# Patient Record
Sex: Female | Born: 1940
Health system: Southern US, Community
[De-identification: ages and names within clinical notes are randomized; demographics above are authoritative.]

## PROBLEM LIST (undated history)

## (undated) DIAGNOSIS — I639 Cerebral infarction, unspecified: Secondary | ICD-10-CM

## (undated) DIAGNOSIS — I10 Essential (primary) hypertension: Secondary | ICD-10-CM

## (undated) DIAGNOSIS — E119 Type 2 diabetes mellitus without complications: Secondary | ICD-10-CM

## (undated) DIAGNOSIS — H269 Unspecified cataract: Secondary | ICD-10-CM

## (undated) HISTORY — DX: Unspecified cataract: H26.9

## (undated) HISTORY — DX: Type 2 diabetes mellitus without complications: E11.9

---

## 1999-07-19 ENCOUNTER — Other Ambulatory Visit: Admission: RE | Admit: 1999-07-19 | Discharge: 1999-07-19 | Payer: Self-pay | Admitting: Internal Medicine

## 2000-07-28 ENCOUNTER — Other Ambulatory Visit: Admission: RE | Admit: 2000-07-28 | Discharge: 2000-07-28 | Payer: Self-pay | Admitting: Internal Medicine

## 2001-09-14 ENCOUNTER — Other Ambulatory Visit: Admission: RE | Admit: 2001-09-14 | Discharge: 2001-09-14 | Payer: Self-pay | Admitting: Internal Medicine

## 2001-11-09 ENCOUNTER — Ambulatory Visit (HOSPITAL_COMMUNITY): Admission: RE | Admit: 2001-11-09 | Discharge: 2001-11-09 | Payer: Self-pay | Admitting: *Deleted

## 2003-01-30 ENCOUNTER — Other Ambulatory Visit: Admission: RE | Admit: 2003-01-30 | Discharge: 2003-01-30 | Payer: Self-pay | Admitting: Obstetrics and Gynecology

## 2004-07-30 ENCOUNTER — Other Ambulatory Visit: Admission: RE | Admit: 2004-07-30 | Discharge: 2004-07-30 | Payer: Self-pay | Admitting: Internal Medicine

## 2006-01-20 ENCOUNTER — Other Ambulatory Visit: Admission: RE | Admit: 2006-01-20 | Discharge: 2006-01-20 | Payer: Self-pay | Admitting: Internal Medicine

## 2006-01-23 ENCOUNTER — Ambulatory Visit (HOSPITAL_COMMUNITY): Admission: RE | Admit: 2006-01-23 | Discharge: 2006-01-23 | Payer: Self-pay | Admitting: Internal Medicine

## 2006-01-23 IMAGING — US US TRANSVAGINAL NON-OB
1 series · 18 of 25 positions shown · non-contrast
Comparison: none

CLINICAL DATA: Postmenopausal bleeding.  Previous history of uterine polyps.  
 TRANSABDOMINAL AND TRANSVAGINAL PELVIC ULTRASOUND:
TECHNIQUE: Both transabdominal and transvaginal ultrasound examinations of the pelvis were performed including evaluation of the uterus, ovaries, adnexal regions, and pelvic cul-de-sac.

[Series 1: us pelvis complete modify · 18 of 68 slices shown]
[im 1/68]
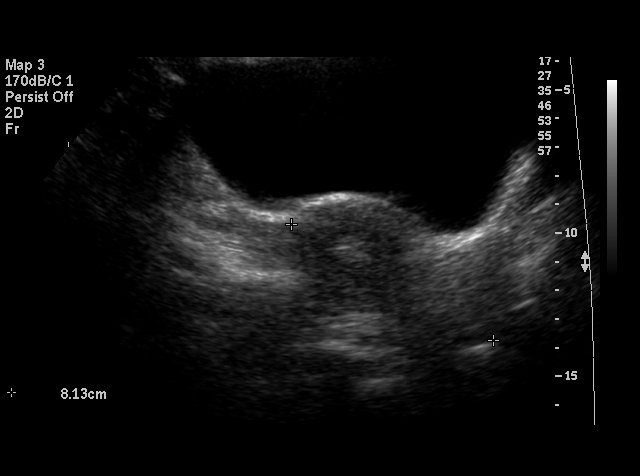
[im 6/68]
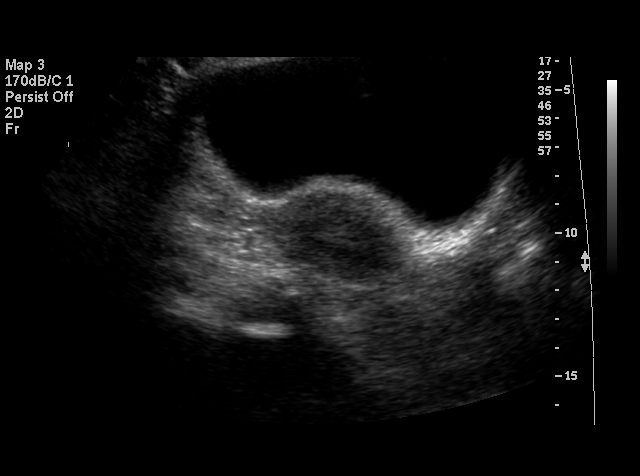
[im 9/68]
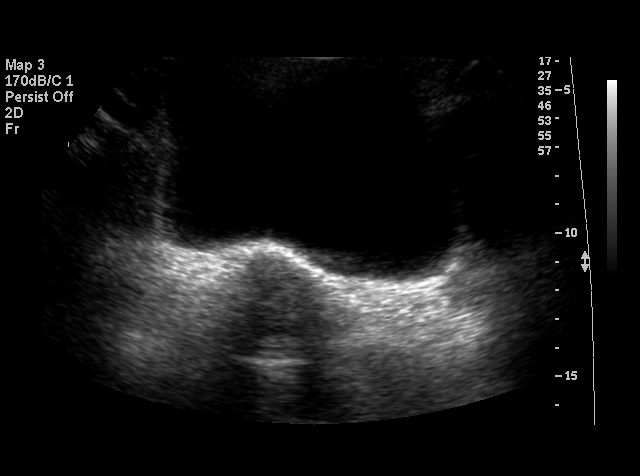
[im 12/68]
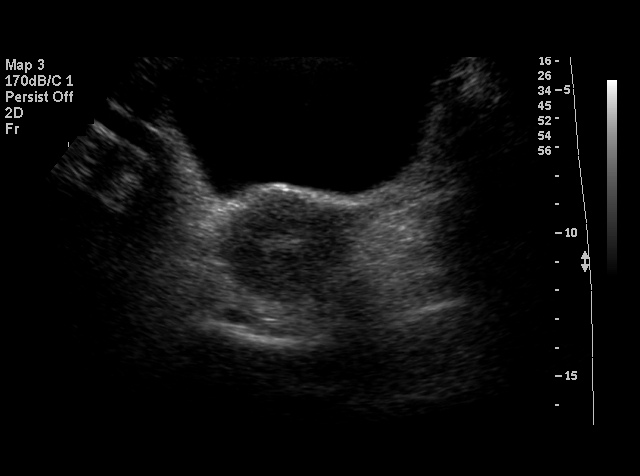
[im 17/68]
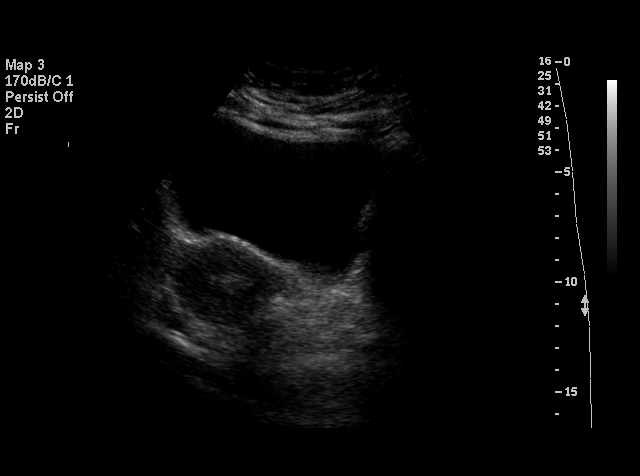
[im 20/68]
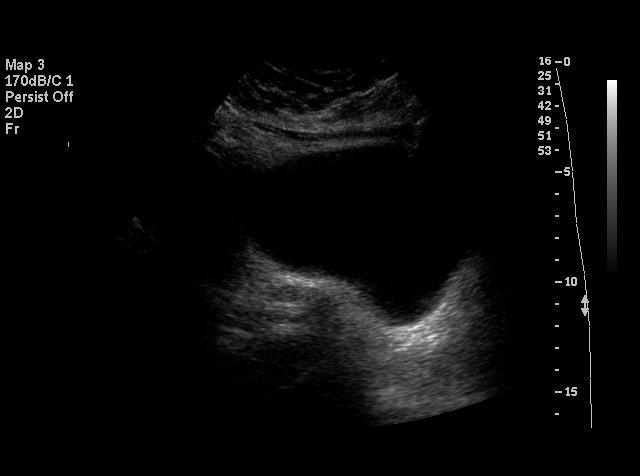
[im 26/68]
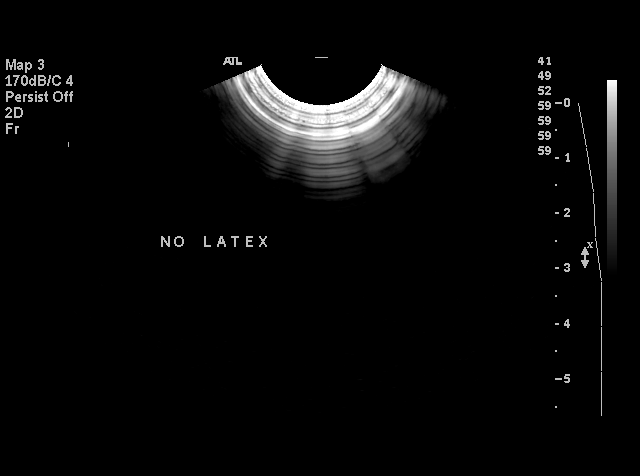
[im 28/68]
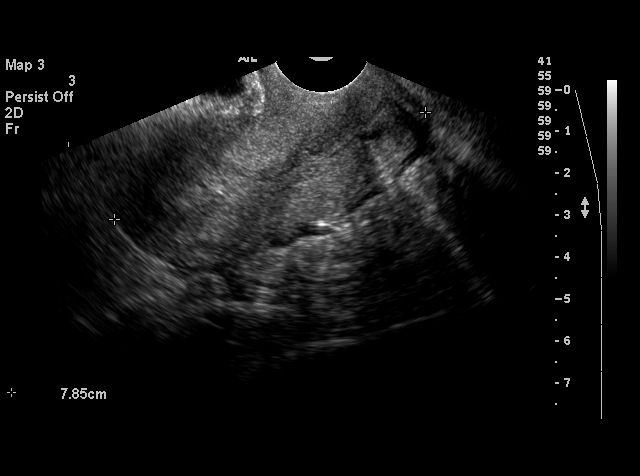
[im 31/68]
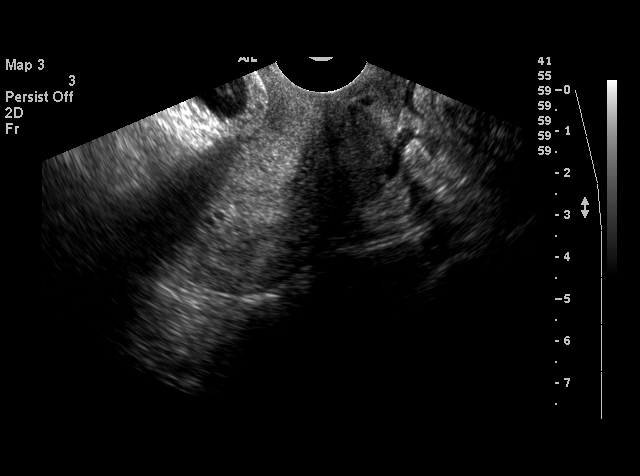
[im 37/68]
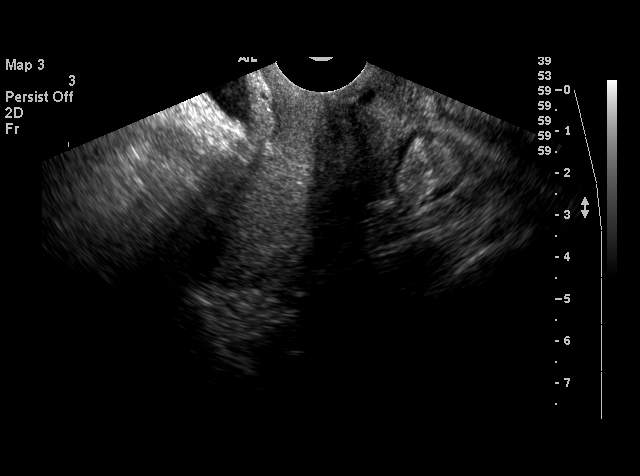
[im 40/68]
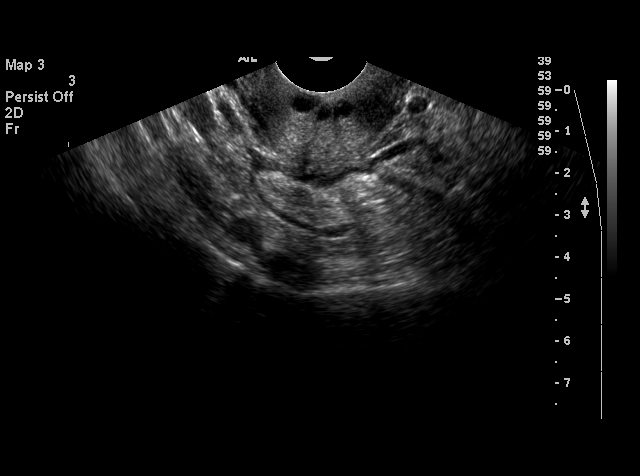
[im 42/68]
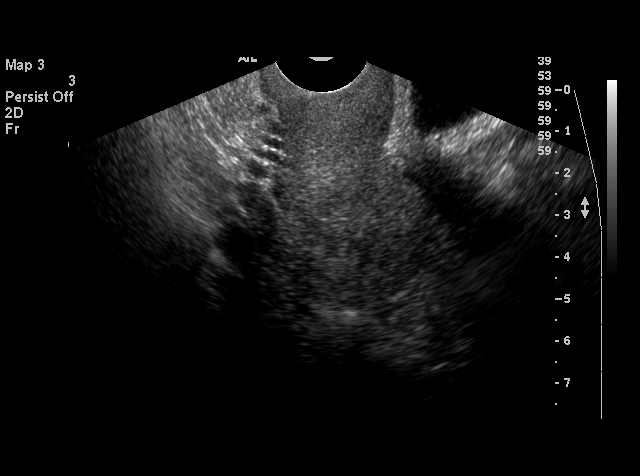
[im 48/68]
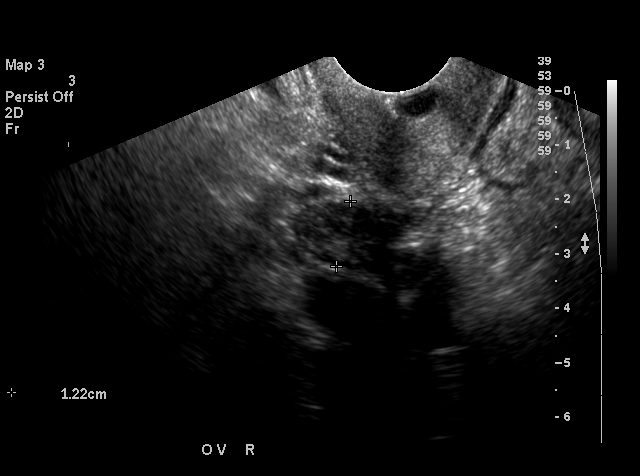
[im 51/68]
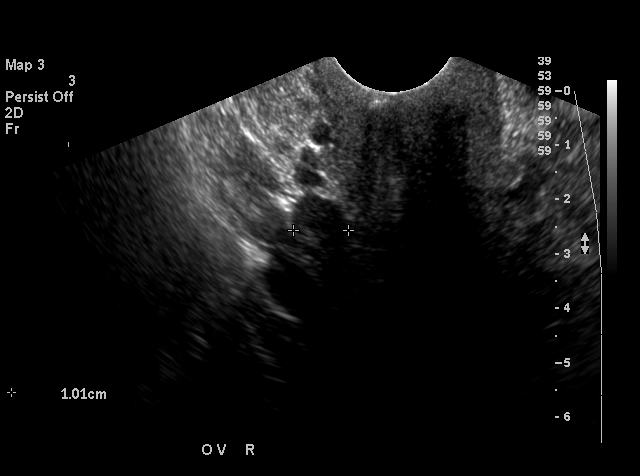
[im 56/68]
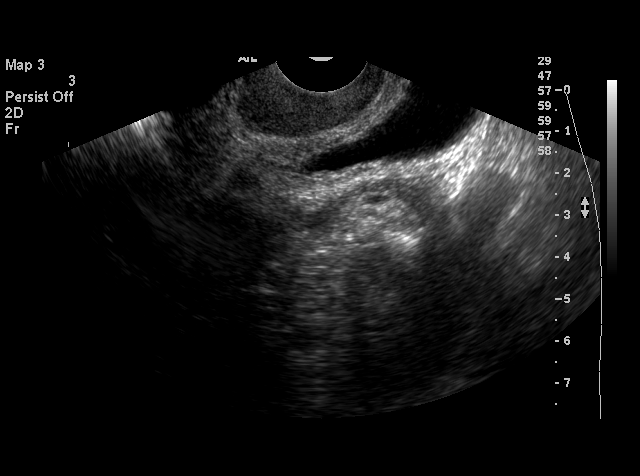
[im 59/68]
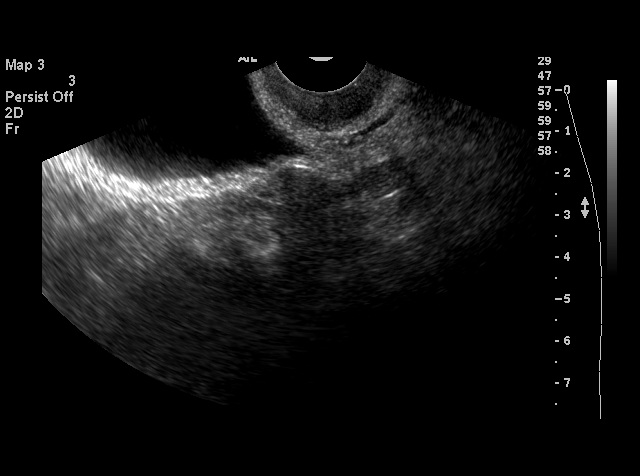
[im 62/68]
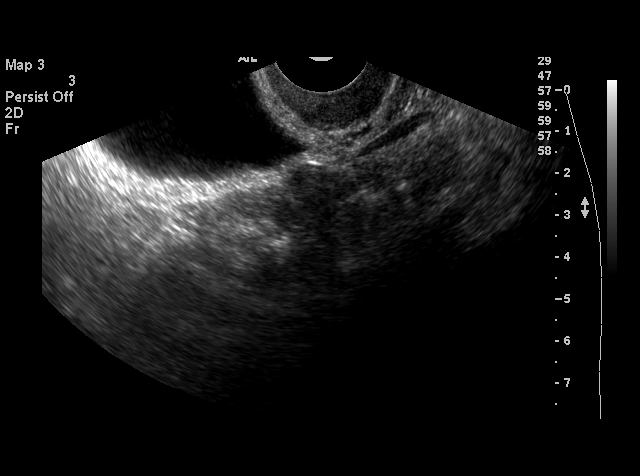
[im 68/68]
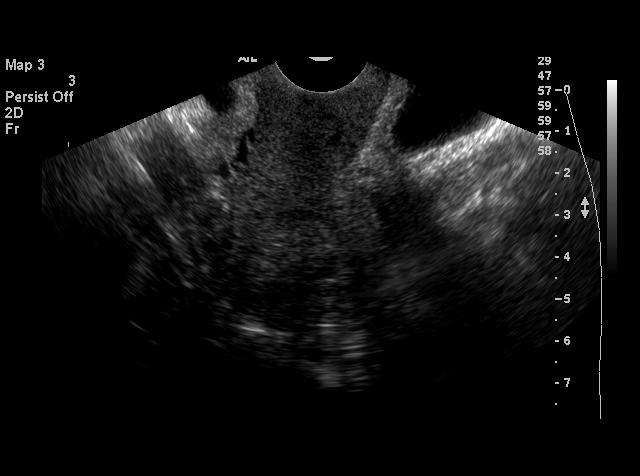

[18 of 25 positions shown; findings below may reference images not displayed]

FINDINGS: The uterus is normal in size measuring approximately 8 cm in length x 4 cm in AP diameter x 4 cm in transverse diameter.  No fibroids are identified.  The endometrium has a heterogeneous appearance with several internal cystic foci.  Maximum double layer endometrial thickness on transvaginal sonography measures 1.2 cm, which is abnormal for a postmenopausal patient.  Differential considerations include endometrial polyps, hyperplasia, and carcinoma. 
 No adnexal masses are identified by either transabdominal or transvaginal sonography.  Normal postmenopausal size and appearance of both ovaries is seen on transvaginal sonography.  There is no evidence of free fluid.
IMPRESSION: 1.  Thickened endometrium measuring 12 mm with internal cystic foci noted.  Differential considerations include endometrial polyps, hyperplasia, or carcinoma.  Further evaluation is recommended with sonohysterogram followed by appropriate method of endometrial sampling.  
 2.  Normal ovaries.  No evidence of adnexal mass or free fluid.

## 2006-04-14 ENCOUNTER — Ambulatory Visit (HOSPITAL_COMMUNITY): Admission: RE | Admit: 2006-04-14 | Discharge: 2006-04-14 | Payer: Self-pay | Admitting: Obstetrics and Gynecology

## 2006-04-24 ENCOUNTER — Emergency Department (HOSPITAL_COMMUNITY): Admission: EM | Admit: 2006-04-24 | Discharge: 2006-04-24 | Payer: Self-pay | Admitting: Family Medicine

## 2006-04-24 IMAGING — CT CT HEAD W/O CM
1 series · 16 of 30 positions shown, 20 images · IV contrast (agent unspecified)
Comparison: none

CLINICAL DATA: Weakness, fever.  Headache.
 HEAD CT WITHOUT CONTRAST:
TECHNIQUE: Contiguous axial images were obtained from the base of the skull through the vertex, according to standard protocol, without contrast.

[Series 2: routinehead 4.8 h45s · axial · 0.45mm/px · z∈[-146,-11]mm · 16 of 30 slices shown, 20 images]
[im 2/30  brain]
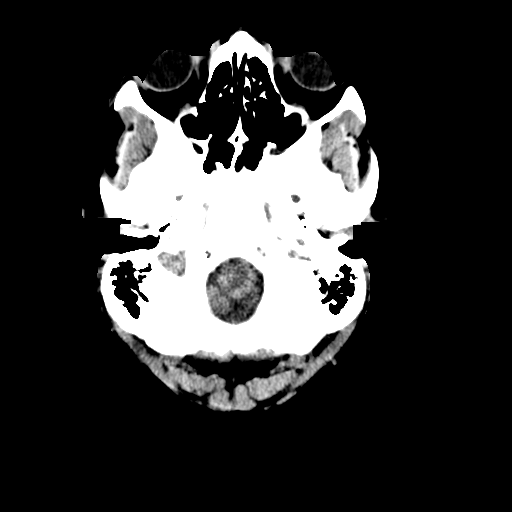
[im 2/30  bone]
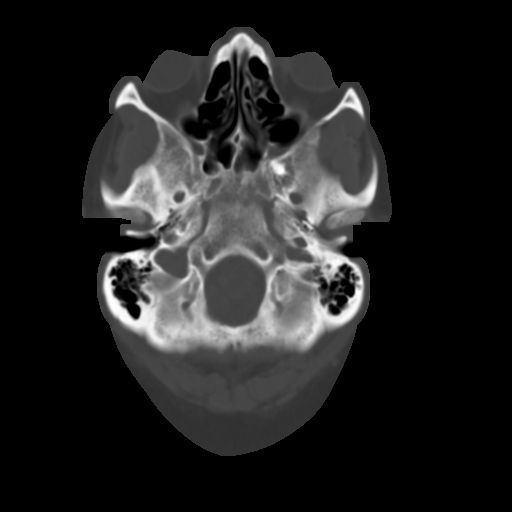
[im 4/30  brain]
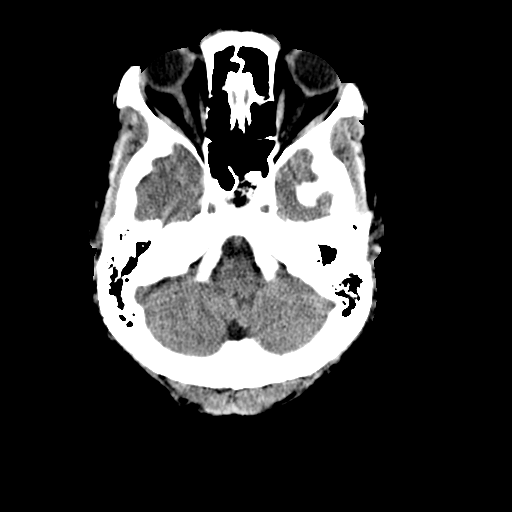
[im 6/30  brain]
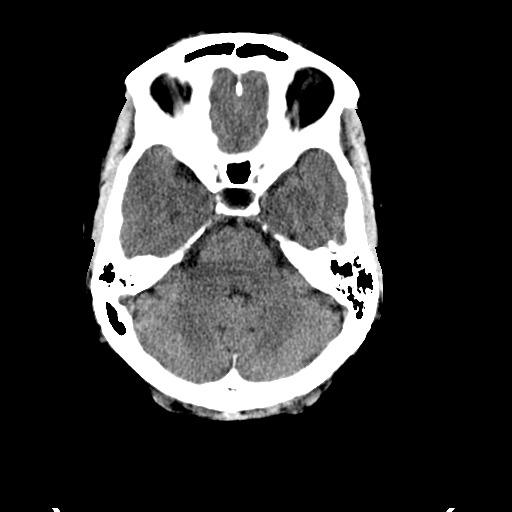
[im 8/30  brain]
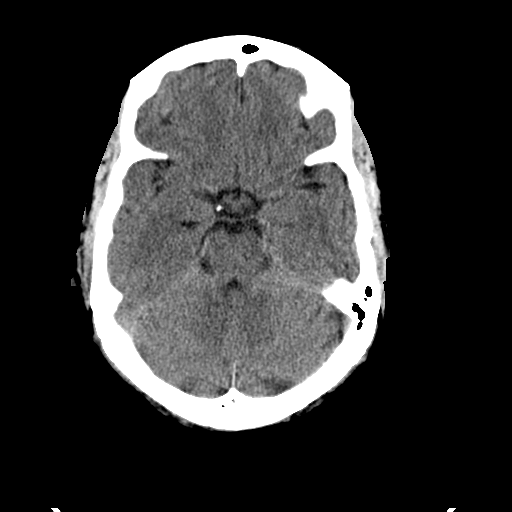
[im 9/30  brain]
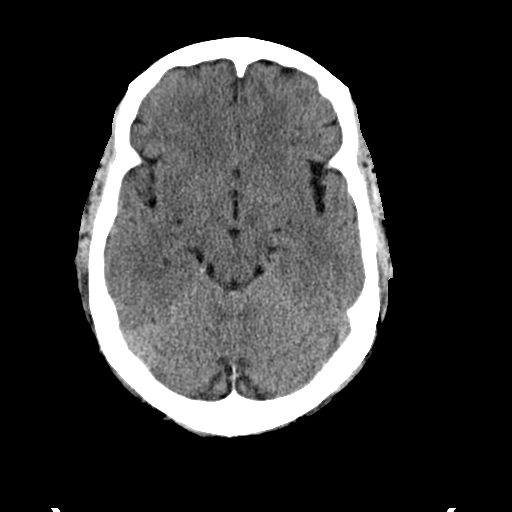
[im 9/30  bone]
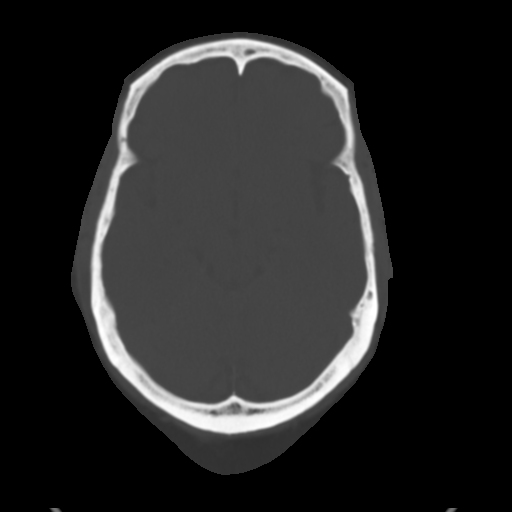
[im 11/30  brain]
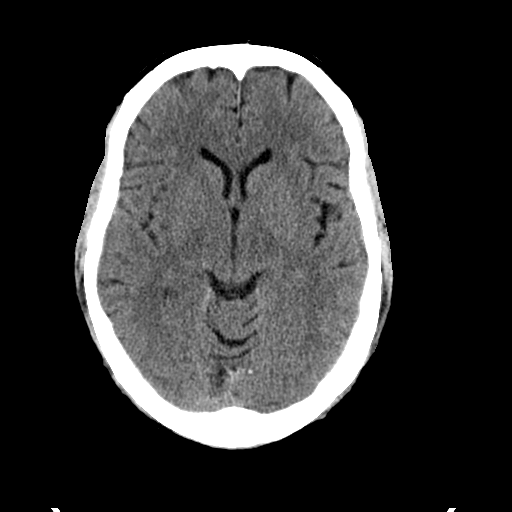
[im 13/30  brain]
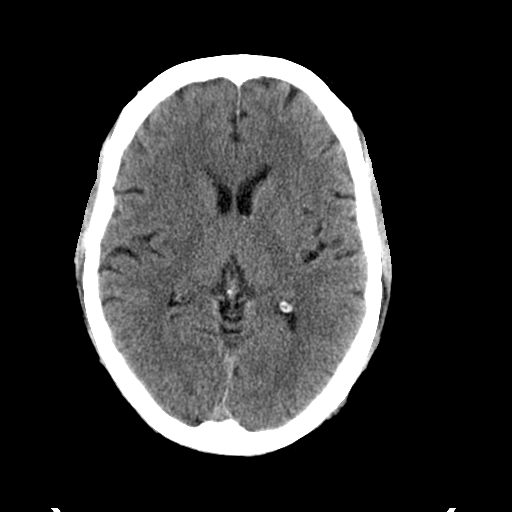
[im 15/30  brain]
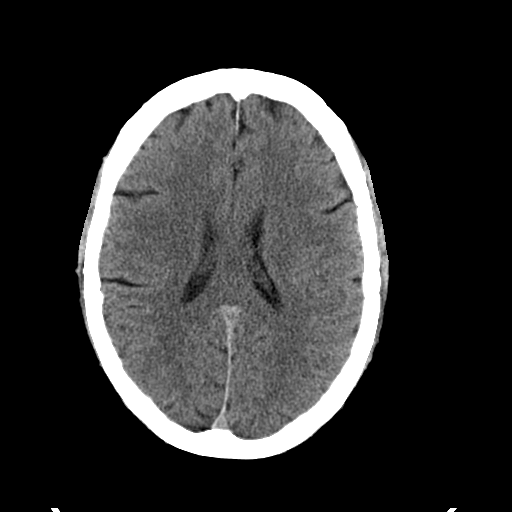
[im 16/30  brain]
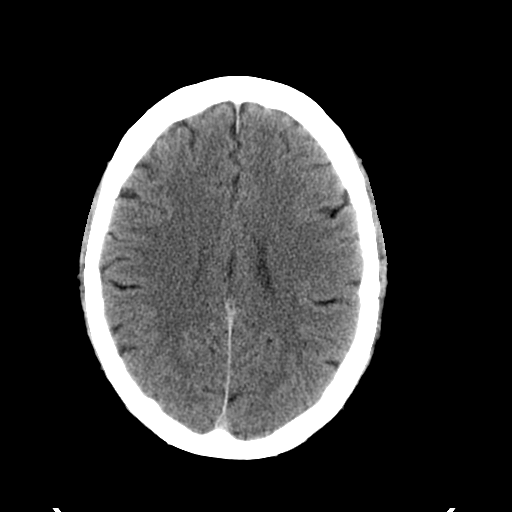
[im 16/30  bone]
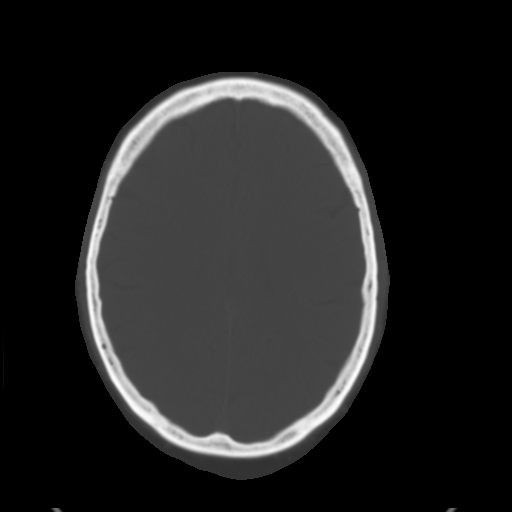
[im 18/30  brain]
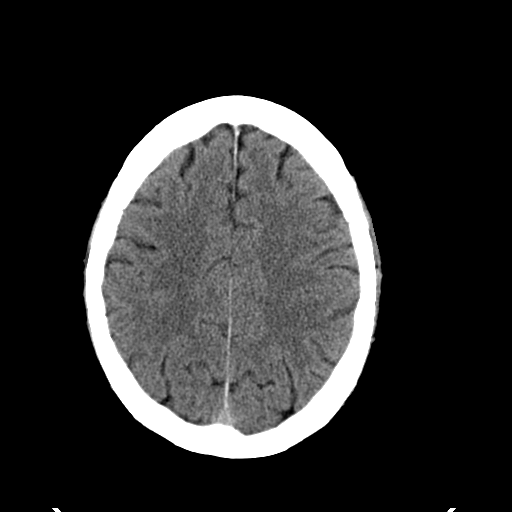
[im 20/30  brain]
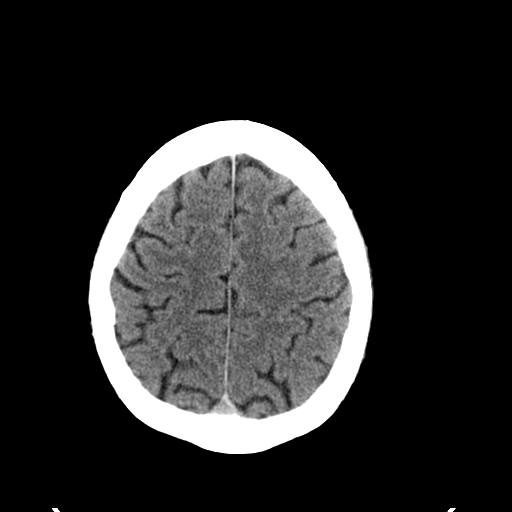
[im 22/30  brain]
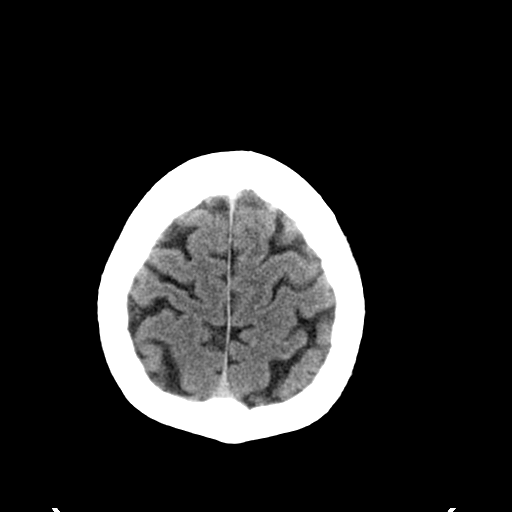
[im 23/30  brain]
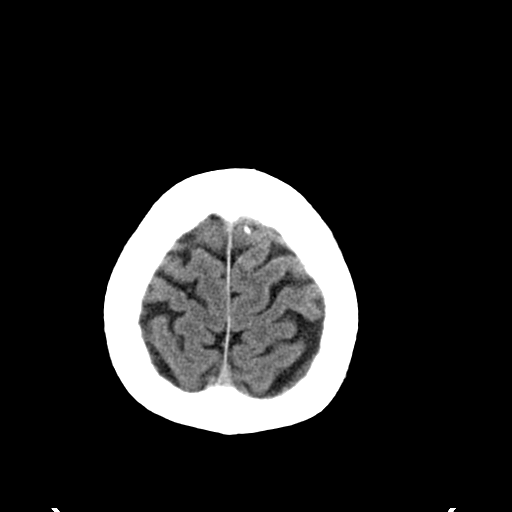
[im 23/30  bone]
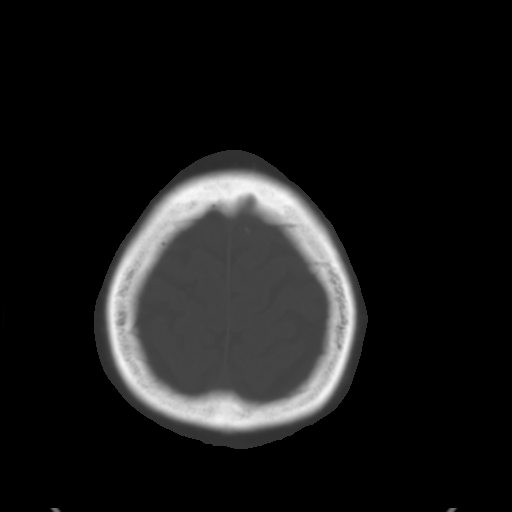
[im 25/30  brain]
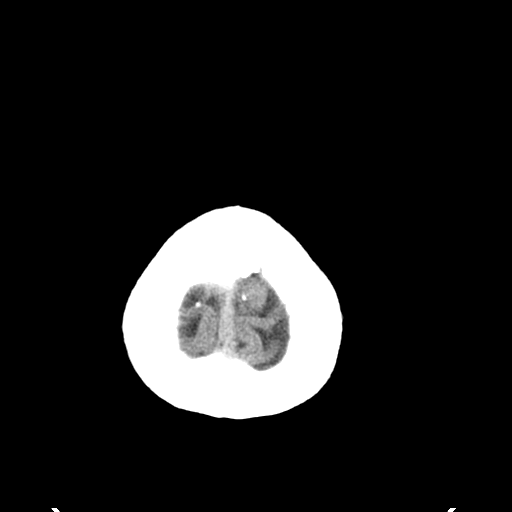
[im 27/30  brain]
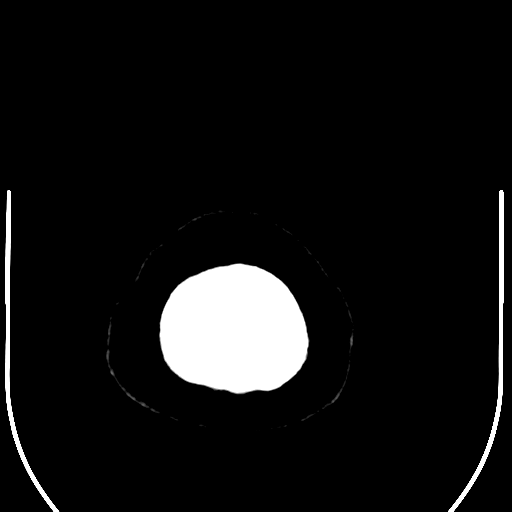
[im 29/30  brain]
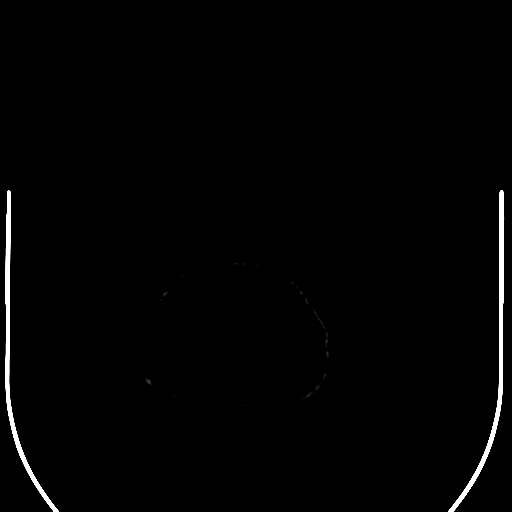

[16 of 30 positions shown; findings below may reference images not displayed]

FINDINGS: There is no evidence of acute intracranial abnormality, including mass or mass effect, hydrocephalus, extra-axial fluid collection, midline shift, hemorrhage, or acute infarct.  Acute infarct may be occult on CT.  The visualized bony calvarium and paranasal sinuses are unremarkable.
IMPRESSION: No evidence of acute intracranial abnormality.

## 2006-10-27 ENCOUNTER — Ambulatory Visit (HOSPITAL_COMMUNITY): Admission: RE | Admit: 2006-10-27 | Discharge: 2006-10-27 | Payer: Self-pay | Admitting: *Deleted

## 2012-01-28 ENCOUNTER — Other Ambulatory Visit: Payer: Self-pay | Admitting: Internal Medicine

## 2012-02-25 ENCOUNTER — Ambulatory Visit
Admission: RE | Admit: 2012-02-25 | Discharge: 2012-02-25 | Disposition: A | Discharge status: Home / Self Care | Payer: Medicare Other | Source: Ambulatory Visit | Attending: Internal Medicine | Admitting: Internal Medicine

## 2012-02-25 IMAGING — CT CT ABD-PELV W/ CM
3 of 5 series · 13 of 36 positions shown, 19 images · IV contrast (READICAT/WATER & [ID] OMNI 300)
Comparison: None.

CLINICAL DATA: Right flank pain.

CT ABDOMEN AND PELVIS WITH CONTRAST
TECHNIQUE: Multidetector CT imaging of the abdomen and pelvis was
performed following the standard protocol during bolus
administration of intravenous contrast.
Contrast: 100mL OMNIPAQUE IOHEXOL 300 MG/ML  SOLN

[Series 3: abd/pelvis with · axial · 0.79mm/px · z∈[-274,-4]mm · 6 of 76 slices shown, 11 images]
[im 11/76  soft-tissue]
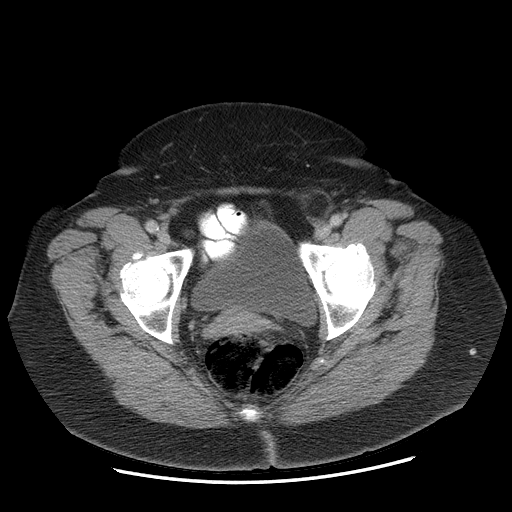
[im 11/76  bone]
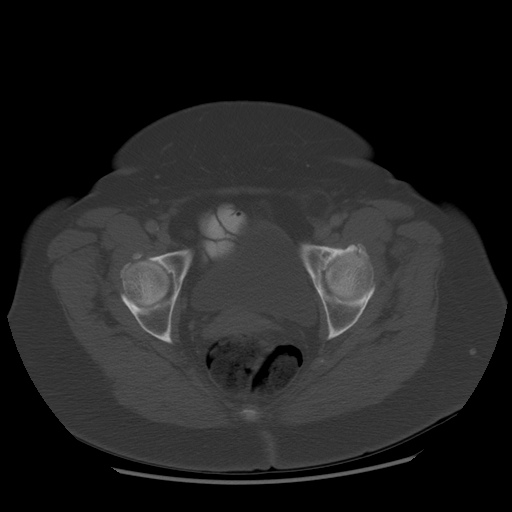
[im 22/76  soft-tissue]
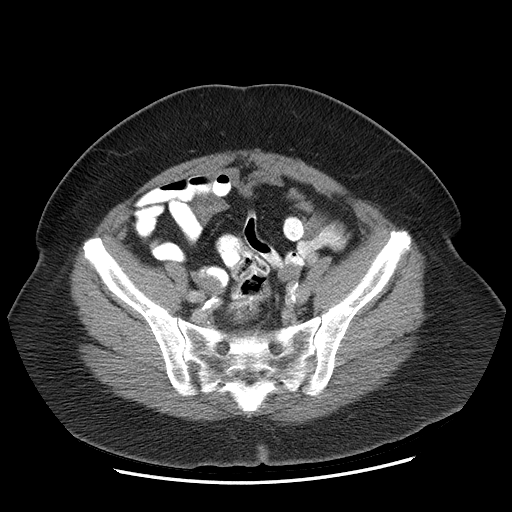
[im 33/76  soft-tissue]
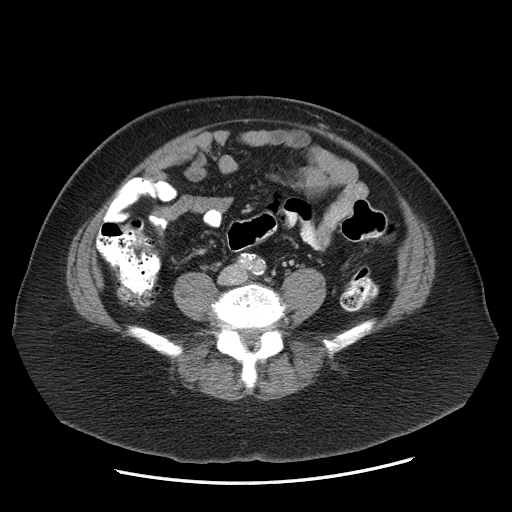
[im 33/76  lung]
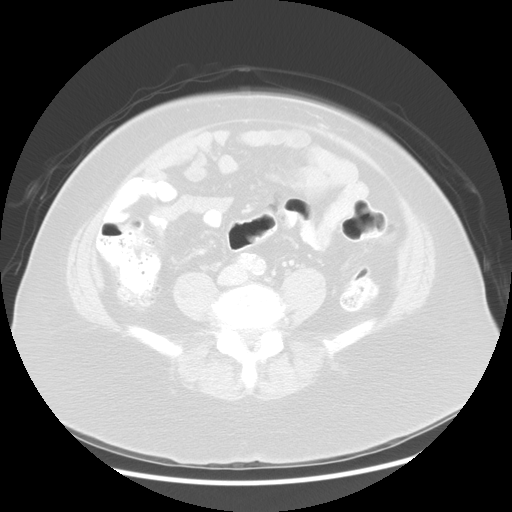
[im 43/76  soft-tissue]
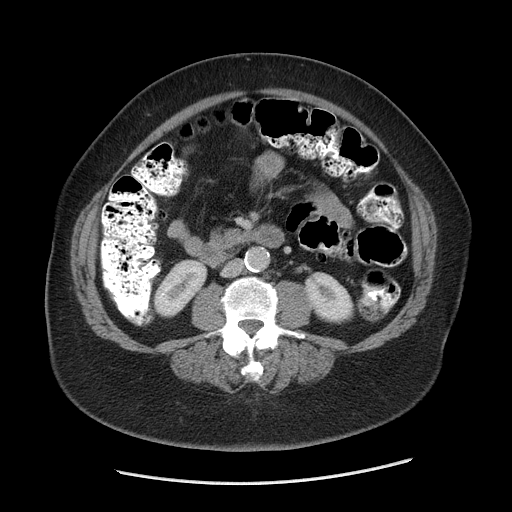
[im 43/76  lung]
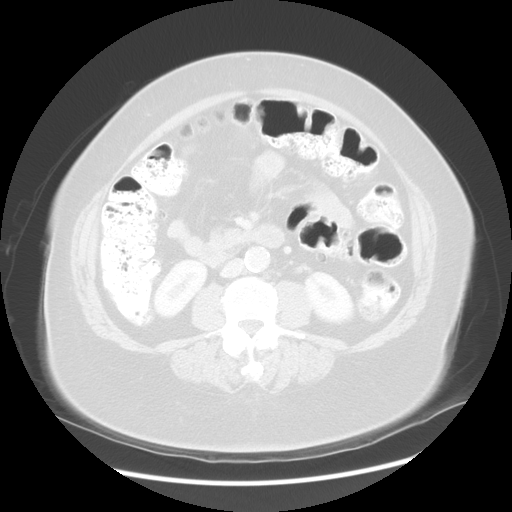
[im 54/76  soft-tissue]
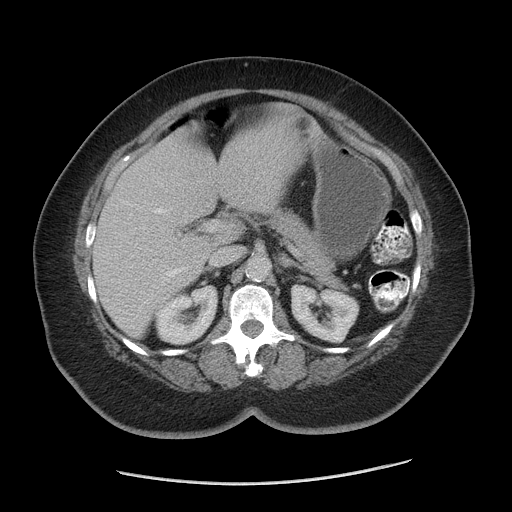
[im 54/76  lung]
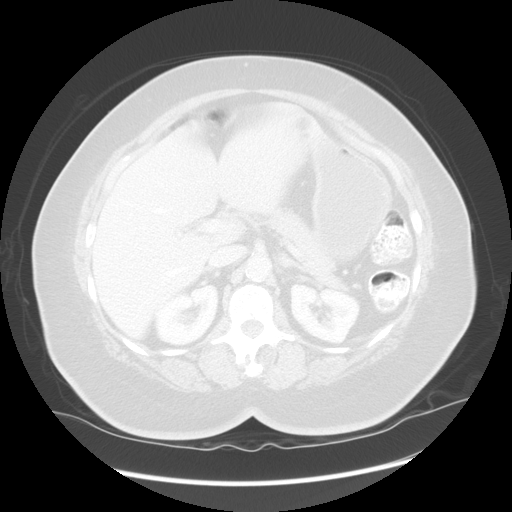
[im 65/76  soft-tissue]
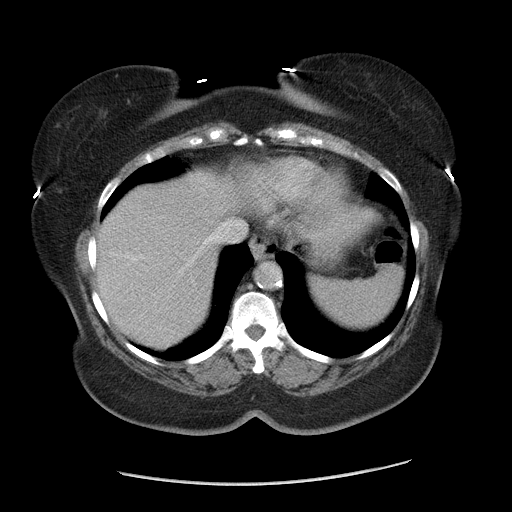
[im 65/76  lung]
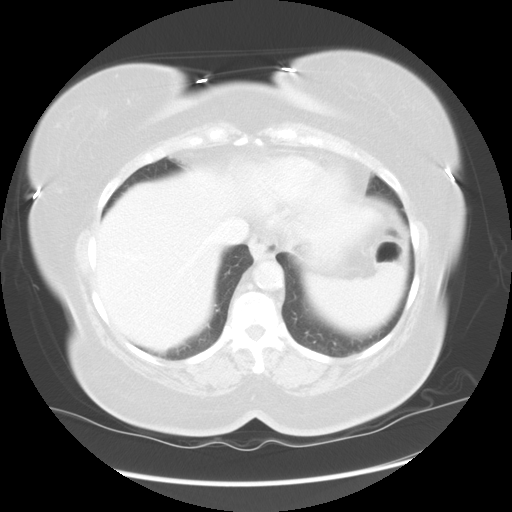

[Series 601: coronal body · coronal · 0.81mm/px · 1 of 139 slices shown, 2 images]
[im 47/139  soft-tissue]
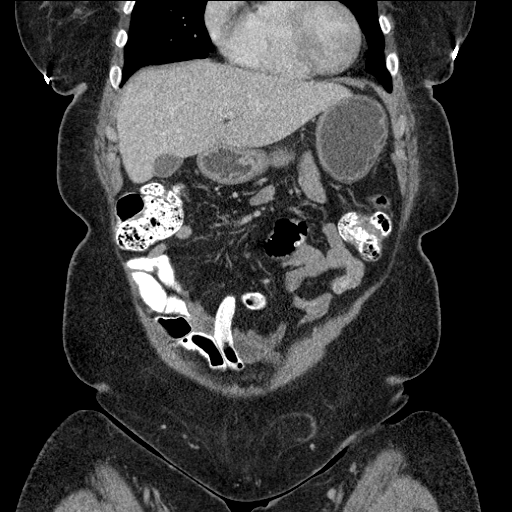
[im 47/139  bone]
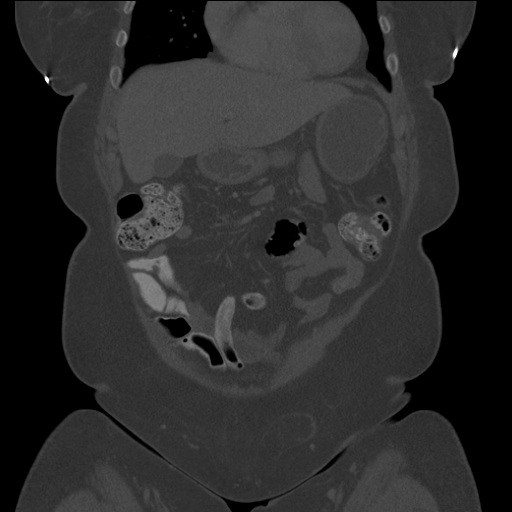

[Series 602: sagittal body · sagittal · 0.81mm/px · 6 of 164 slices shown]
[im 20/164  soft-tissue]
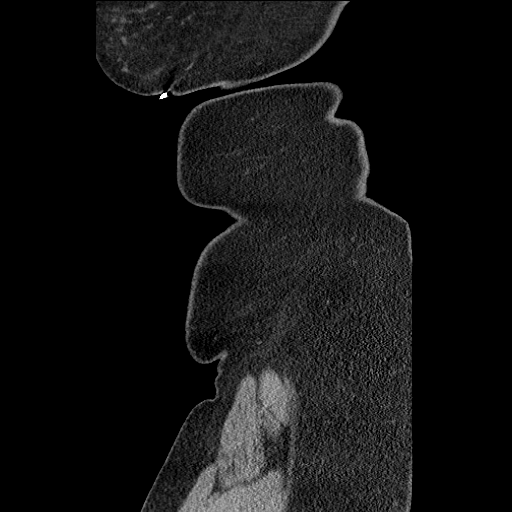
[im 39/164  soft-tissue]
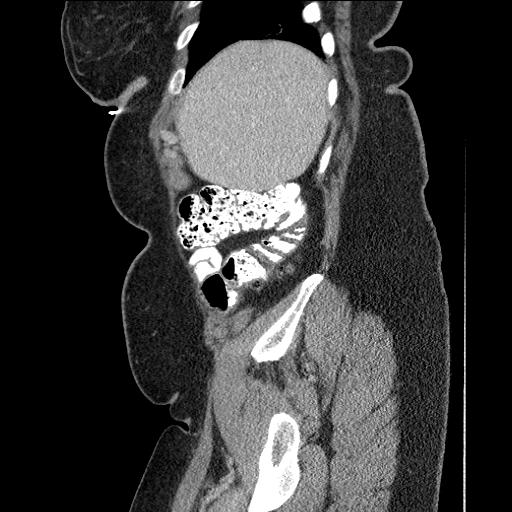
[im 58/164  soft-tissue]
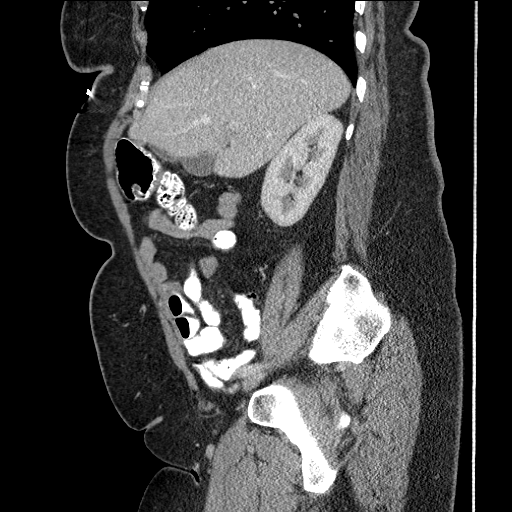
[im 77/164  soft-tissue]
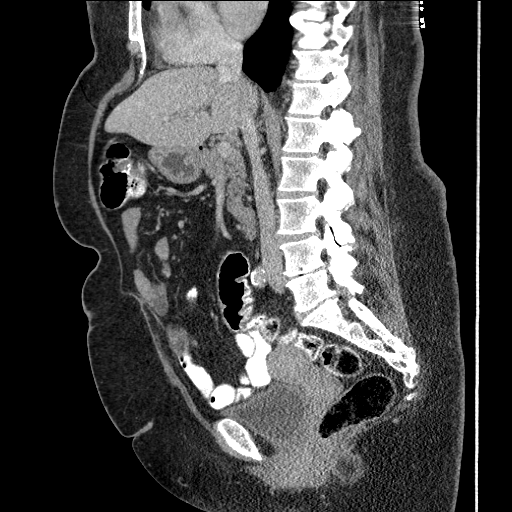
[im 96/164  soft-tissue]
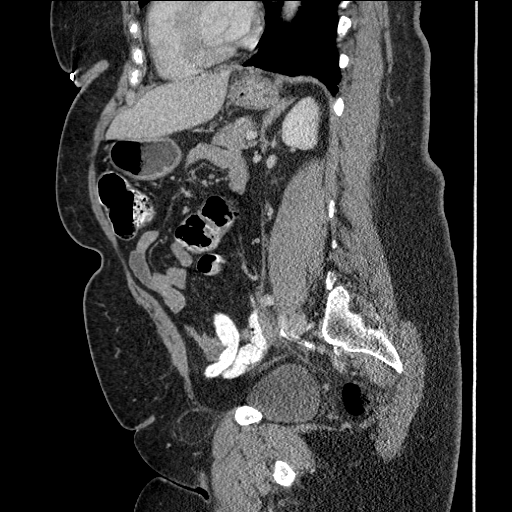
[im 116/164  soft-tissue]
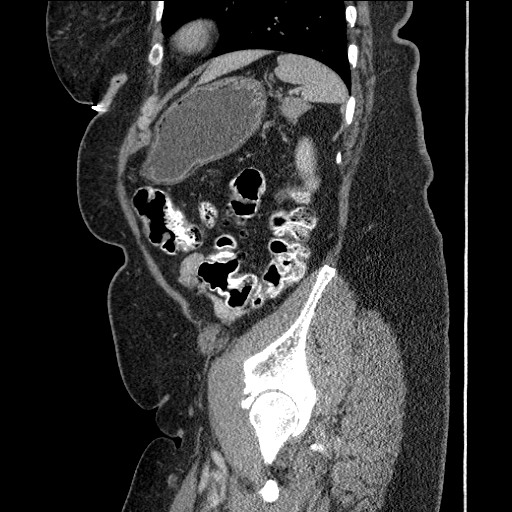

[13 of 36 positions shown; findings below may reference images not displayed]

FINDINGS: Small low-density area centrally within the liver, likely
small cyst.  No other focal abnormality.  Gallbladder, spleen,
pancreas, adrenals and kidneys are unremarkable.

Appendix is visualized and is normal. There are scattered right
colonic diverticula.  No changes of active diverticulitis.
Otherwise, large and small bowel are grossly unremarkable.  No free
fluid, free air, or adenopathy.  Uterus, adnexa and urinary bladder
is unremarkable.

Small left inguinal hernia containing fat.  Aorta and iliac vessels
are heavily calcified, non-aneurysmal.
IMPRESSION: Right colonic diverticulosis.  No active diverticulitis.  Moderate
stool burden throughout the colon.

## 2012-02-25 MED ORDER — IOHEXOL 300 MG/ML  SOLN
100.0000 mL | Freq: Once | INTRAMUSCULAR | Status: AC | PRN
  Administered 2012-02-25: 100 mL via INTRAVENOUS

## 2013-01-17 ENCOUNTER — Ambulatory Visit (INDEPENDENT_AMBULATORY_CARE_PROVIDER_SITE_OTHER): Payer: Medicare Other | Admitting: Family Medicine

## 2013-01-17 VITALS — BP 166/80 | HR 80 | Temp 98.1°F | Resp 16 | Ht 63.0 in | Wt 190.0 lb

## 2013-01-17 DIAGNOSIS — L03012 Cellulitis of left finger: Secondary | ICD-10-CM

## 2013-01-17 DIAGNOSIS — L03019 Cellulitis of unspecified finger: Secondary | ICD-10-CM

## 2013-01-17 DIAGNOSIS — M79645 Pain in left finger(s): Secondary | ICD-10-CM

## 2013-01-17 DIAGNOSIS — M79609 Pain in unspecified limb: Secondary | ICD-10-CM

## 2013-01-17 MED ORDER — SULFAMETHOXAZOLE-TRIMETHOPRIM 800-160 MG PO TABS: 1.0000 | ORAL_TABLET | Freq: Two times a day (BID) | ORAL | Status: DC

## 2013-01-17 NOTE — Progress Notes (Signed)
Subjective: 72 year old lady who is here with a painful left thumb. Last Thursday he just started spontaneously hurting in his gotten worse until the thumb is throbbing her. Otherwise she is generally healthy. She does have hypertension and is on medication. She's been told that she was a new diabetic and has been on metformin but her sugars are being., 109 this past Saturday.  Objective: Paronychia left thumb, quite tender, moderate swelling  Assessment: Acute paronychia  Plan: I&D thumb. Bactrim DS twice a day Return if further problems

## 2013-01-17 NOTE — Progress Notes (Signed)
Procedure Note: Verbal consent obtained from the patient.  Partial digital block of the thumb with 2cc 1% plain lidocaine.  Betadine prep.  Incision with 11 blade at the medial nail margin.  Moderate amount of purulence expressed.  Culture collected.  Hemostasis obtained with direct pressure.  Bandage applied.  Wound care discussed.  Pt tolerated well.

## 2013-01-17 NOTE — Patient Instructions (Addendum)
Keep wound clean and dry.  Take the antibiotics one twice daily. If the antibiotic causes you any problems like a rash or other allergy symptoms stop it immediately and let us know.  Return if further problems   Paronychia Paronychia is an inflammatory reaction involving the folds of the skin surrounding the fingernail. This is commonly caused by an infection in the skin around a nail. The most common cause of paronychia is frequent wetting of the hands (as seen with bartenders, food servers, nurses or others who wet their hands). This makes the skin around the fingernail susceptible to infection by bacteria (germs) or fungus. Other predisposing factors are:  Aggressive manicuring.  Nail biting.  Thumb sucking. The most common cause is a staphylococcal (a type of germ) infection, or a fungal (Candida) infection. When caused by a germ, it usually comes on suddenly with redness, swelling, pus and is often painful. It may get under the nail and form an abscess (collection of pus), or form an abscess around the nail. If the nail itself is infected with a fungus, the treatment is usually prolonged and may require oral medicine for up to one year. Your caregiver will determine the length of time treatment is required. The paronychia caused by bacteria (germs) may largely be avoided by not pulling on hangnails or picking at cuticles. When the infection occurs at the tips of the finger it is called felon. When the cause of paronychia is from the herpes simplex virus (HSV) it is called herpetic whitlow. TREATMENT  When an abscess is present treatment is often incision and drainage. This means that the abscess must be cut open so the pus can get out. When this is done, the following home care instructions should be followed. HOME CARE INSTRUCTIONS   It is important to keep the affected fingers very dry. Rubber or plastic gloves over cotton gloves should be used whenever the hand must be placed in  water.  Keep wound clean, dry and dressed as suggested by your caregiver between warm soaks or warm compresses.  Soak in warm water for fifteen to twenty minutes three to four times per day for bacterial infections. Fungal infections are very difficult to treat, so often require treatment for long periods of time.  For bacterial (germ) infections take antibiotics (medicine which kill germs) as directed and finish the prescription, even if the problem appears to be solved before the medicine is gone.  Only take over-the-counter or prescription medicines for pain, discomfort, or fever as directed by your caregiver. SEEK IMMEDIATE MEDICAL CARE IF:  You have redness, swelling, or increasing pain in the wound.  You notice pus coming from the wound.  You have a fever.  You notice a bad smell coming from the wound or dressing. Document Released: 03/18/2001 Document Revised: 12/15/2011 Document Reviewed: 11/17/2008 [facility] Patient Information 2013 [place], [place].

## 2013-01-20 LAB — WOUND CULTURE
Gram Stain: NONE SEEN
Gram Stain: NONE SEEN

## 2015-07-29 DIAGNOSIS — Z23 Encounter for immunization: Secondary | ICD-10-CM | POA: Diagnosis not present

## 2015-08-27 DIAGNOSIS — Z0000 Encounter for general adult medical examination without abnormal findings: Secondary | ICD-10-CM | POA: Diagnosis not present

## 2015-08-27 DIAGNOSIS — E7800 Pure hypercholesterolemia, unspecified: Secondary | ICD-10-CM | POA: Diagnosis not present

## 2015-08-27 DIAGNOSIS — R739 Hyperglycemia, unspecified: Secondary | ICD-10-CM | POA: Diagnosis not present

## 2015-08-27 DIAGNOSIS — I10 Essential (primary) hypertension: Secondary | ICD-10-CM | POA: Diagnosis not present

## 2015-09-05 DIAGNOSIS — I10 Essential (primary) hypertension: Secondary | ICD-10-CM | POA: Diagnosis not present

## 2015-09-05 DIAGNOSIS — R739 Hyperglycemia, unspecified: Secondary | ICD-10-CM | POA: Diagnosis not present

## 2015-11-09 DIAGNOSIS — Z803 Family history of malignant neoplasm of breast: Secondary | ICD-10-CM | POA: Diagnosis not present

## 2015-11-09 DIAGNOSIS — Z1231 Encounter for screening mammogram for malignant neoplasm of breast: Secondary | ICD-10-CM | POA: Diagnosis not present

## 2015-11-13 ENCOUNTER — Emergency Department (HOSPITAL_COMMUNITY)
Admission: EM | Admit: 2015-11-13 | Discharge: 2015-11-13 | Disposition: A | Discharge status: Home / Self Care | Payer: Medicare HMO | Source: Home / Self Care | Attending: Family Medicine | Admitting: Family Medicine

## 2015-11-13 ENCOUNTER — Encounter (HOSPITAL_COMMUNITY): Payer: Self-pay | Admitting: Emergency Medicine

## 2015-11-13 DIAGNOSIS — I10 Essential (primary) hypertension: Secondary | ICD-10-CM

## 2015-11-13 DIAGNOSIS — H6693 Otitis media, unspecified, bilateral: Secondary | ICD-10-CM

## 2015-11-13 MED ORDER — FLUTICASONE PROPIONATE 50 MCG/ACT NA SUSP: 2.0000 | Freq: Every day | NASAL | Status: DC

## 2015-11-13 MED ORDER — AMOXICILLIN 500 MG PO CAPS: 500.0000 mg | ORAL_CAPSULE | Freq: Three times a day (TID) | ORAL | Status: DC

## 2015-11-13 NOTE — Discharge Instructions (Signed)
How to Take Your Blood Pressure °HOW DO I GET A BLOOD PRESSURE MACHINE? °· You can buy an electronic home blood pressure machine at your local pharmacy. Insurance will sometimes cover the cost if you have a prescription. °· Ask your doctor what type of machine is best for you. There are different machines for your arm and your wrist. °· If you decide to buy a machine to check your blood pressure on your arm, first check the size of your arm so you can buy the right size cuff. To check the size of your arm:   °· Use a measuring tape that shows both inches and centimeters.   °· Wrap the measuring tape around the upper-middle part of your arm. You may need someone to help you measure.   °· Write down your arm measurement in both inches and centimeters.   °· To measure your blood pressure correctly, it is important to have the right size cuff.   °· If your arm is up to 13 inches (up to 34 centimeters), get an adult cuff size. °· If your arm is 13 to 17 inches (35 to 44 centimeters), get a large adult cuff size.   °·  If your arm is 17 to 20 inches (45 to 52 centimeters), get an adult thigh cuff.   °WHAT DO THE NUMBERS MEAN?  °· There are two numbers that make up your blood pressure. For example: 120/80. °· The first number (120 in our example) is called the "systolic pressure." It is a measure of the pressure in your blood vessels when your heart is pumping blood. °· The second number (80 in our example) is called the "diastolic pressure." It is a measure of the pressure in your blood vessels when your heart is resting between beats. °· Your doctor will tell you what your blood pressure should be. °WHAT SHOULD I DO BEFORE I CHECK MY BLOOD PRESSURE?  °· Try to rest or relax for at least 30 minutes before you check your blood pressure. °· Do not smoke. °· Do not have any drinks with caffeine, such as: °· Soda. °· Coffee. °· Tea. °· Check your blood pressure in a quiet room. °· Sit down and stretch out your arm on a table.  Keep your arm at about the level of your heart. Let your arm relax. °· Make sure that your legs are not crossed. °HOW DO I CHECK MY BLOOD PRESSURE? °· Follow the directions that came with your machine. °· Make sure you remove any tight-fitting clothing from your arm or wrist. Wrap the cuff around your upper arm or wrist. You should be able to fit a finger between the cuff and your arm. If you cannot fit a finger between the cuff and your arm, it is too tight and should be removed and rewrapped. °· Some units require you to manually pump up the arm cuff. °· Automatic units inflate the cuff when you press a button. °· Cuff deflation is automatic in both models. °· After the cuff is inflated, the unit measures your blood pressure and pulse. The readings are shown on a monitor. Hold still and breathe normally while the cuff is inflated. °· Getting a reading takes less than a minute. °· Some models store readings in a memory. Some provide a printout of readings. If your machine does not store your readings, keep a written record. °· Take readings with you to your next visit with your doctor. °  °This information is not intended to replace advice given to   you by your health care provider. Make sure you discuss any questions you have with your health care provider. °  °Document Released: 09/04/2008 Document Revised: 10/13/2014 Document Reviewed: 11/17/2013 °Elsevier Interactive Patient Education ©2016 [employer] Inc[employer] ° °Hypertension °Hypertension, commonly called high blood pressure, is when the force of blood pumping through your arteries is too strong. Your arteries are the blood vessels that carry blood from your heart throughout your body. A blood pressure reading consists of a higher number over a lower number, such as 110/72. The higher number (systolic) is the pressure inside your arteries when your heart pumps. The lower number (diastolic) is the pressure inside your arteries when your heart relaxes. Ideally you want  your blood pressure below 120/80. °Hypertension forces your heart to work harder to pump blood. Your arteries may become narrow or stiff. Having untreated or uncontrolled hypertension can cause heart attack, stroke, kidney disease, and other problems. °RISK FACTORS °Some risk factors for high blood pressure are controllable. Others are not.  °Risk factors you cannot control include:  °· Race. You may be at higher risk if you are African American. °· Age. Risk increases with age. °· Gender. Men are at higher risk than women before age 45 years. After age 65, women are at higher risk than men. °Risk factors you can control include: °· Not getting enough exercise or physical activity. °· Being overweight. °· Getting too much fat, sugar, calories, or salt in your diet. °· Drinking too much alcohol. °SIGNS AND SYMPTOMS °Hypertension does not usually cause signs or symptoms. Extremely high blood pressure (hypertensive crisis) may cause headache, anxiety, shortness of breath, and nosebleed. °DIAGNOSIS °To check if you have hypertension, your health care provider will measure your blood pressure while you are seated, with your arm held at the level of your heart. It should be measured at least twice using the same arm. Certain conditions can cause a difference in blood pressure between your right and left arms. A blood pressure reading that is higher than normal on one occasion does not mean that you need treatment. If it is not clear whether you have high blood pressure, you may be asked to return on a different day to have your blood pressure checked again. Or, you may be asked to monitor your blood pressure at home for 1 or more weeks. °TREATMENT °Treating high blood pressure includes making lifestyle changes and possibly taking medicine. Living a healthy lifestyle can help lower high blood pressure. You may need to change some of your habits. °Lifestyle changes may include: °· Following the DASH diet. This diet is high  in fruits, vegetables, and whole grains. It is low in salt, red meat, and added sugars. °· Keep your sodium intake below 2,300 mg per day. °· Getting at least 30-45 minutes of aerobic exercise at least 4 times per week. °· Losing weight if necessary. °· Not smoking. °· Limiting alcoholic beverages. °· Learning ways to reduce stress. °Your health care provider may prescribe medicine if lifestyle changes are not enough to get your blood pressure under control, and if one of the following is true: °· You are 18-59 years of age and your systolic blood pressure is above 140. °· You are 60 years of age or older, and your systolic blood pressure is above 150. °· Your diastolic blood pressure is above 90. °· You have diabetes, and your systolic blood pressure is over 140 or your diastolic blood pressure is over 90. °· You have kidney disease   and your blood pressure is above 140/90.  You have heart disease and your blood pressure is above 140/90. Your personal target blood pressure may vary depending on your medical conditions, your age, and other factors. HOME CARE INSTRUCTIONS  Have your blood pressure rechecked as directed by your health care provider.   Take medicines only as directed by your health care provider. Follow the directions carefully. Blood pressure medicines must be taken as prescribed. The medicine does not work as well when you skip doses. Skipping doses also puts you at risk for problems.  Do not smoke.   Monitor your blood pressure at home as directed by your health care provider. SEEK MEDICAL CARE IF:   You think you are having a reaction to medicines taken.  You have recurrent headaches or feel dizzy.  You have swelling in your ankles.  You have trouble with your vision. SEEK IMMEDIATE MEDICAL CARE IF:  You develop a severe headache or confusion.  You have unusual weakness, numbness, or feel faint.  You have severe chest or abdominal pain.  You vomit repeatedly.  You  have trouble breathing. MAKE SURE YOU:   Understand these instructions.  Will watch your condition.  Will get help right away if you are not doing well or get worse.   This information is not intended to replace advice given to you by your health care provider. Make sure you discuss any questions you have with your health care provider.   Document Released: 09/22/2005 Document Revised: 02/06/2015 Document Reviewed: 07/15/2013 Elsevier Interactive Patient Education 2016 Else[employer] Inc[employer] serouSerous Otitis Media Serous otitis media is fluid in the middle ear space. This space contains the bones for hearing and air. Air in the middle ear space helps to transmit sound.  The air gets there through the eustachian tube. This tube goes from the back of the nose (nasopharynx) to the middle ear space. It keeps the pressure in the middle ear the same as the outside world. It also helps to drain fluid from the middle ear space. CAUSES  Serous otitis media occurs when the eustachian tube gets blocked. Blockage can come from:  Ear infections.  Colds and other upper respiratory infections.  Allergies.  Irritants such as cigarette smoke.  Sudden changes in air pressure (such as descending in an airplane).  Enlarged adenoids.  A mass in the nasopharynx. During colds and upper respiratory infections, the middle ear space can become temporarily filled with fluid. This can happen after an ear infection also. Once the infection clears, the fluid will generally drain out of the ear through the eustachian tube. If it does not, then serous otitis media occurs. SIGNS AND SYMPTOMS   Hearing loss.  A feeling of fullness in the ear, without pain.  Young children may not show any symptoms but may show slight behavioral changes, such as agitation, ear pulling, or crying. DIAGNOSIS  Serous otitis media is diagnosed by an ear exam. Tests may be done to check on the movement of the eardrum. Hearing exams may  also be done. TREATMENT  The fluid most often goes away without treatment. If allergy is the cause, allergy treatment may be helpful. Fluid that persists for several months may require minor surgery. A small tube is placed in the eardrum to:  Drain the fluid.  Restore the air in the middle ear space. In certain situations, antibiotic medicines are used to avoid surgery. Surgery may be done to remove enlarged adenoids (if this is the cause). HOME  CARE INSTRUCTIONS   Keep children away from tobacco smoke.  Keep all follow-up visits as directed by your health care provider. SEEK MEDICAL CARE IF:   Your hearing is not better in 3 months.  Your hearing is worse.  You have ear pain.  You have drainage from the ear.  You have dizziness.  You have serous otitis media only in one ear or have any bleeding from your nose (epistaxis).  You notice a lump on your neck. MAKE SURE YOU:  Understand these instructions.   Will watch your condition.   Will get help right away if you are not doing well or get worse.    This information is not intended to replace advice given to you by your health care provider. Make sure you discuss any questions you have with your health care provider.   Document Released: 12/13/2003 Document Revised: 10/13/2014 Document Reviewed: 04/19/2013 Elsevier Interactive Patient Education [employer]

## 2015-11-13 NOTE — ED Notes (Signed)
C/o cold sx onset 02/05 associated w/prod cough, facial pressure, left ear fullness and runny nose Denies fevers BP today = 199/80... Jamse Mead, PA has been notified A&O x4... No acute distress.

## 2015-11-13 NOTE — ED Provider Notes (Signed)
CSN: [id number]     Arrival date & time 11/13/15  1620 History   First MD Initiated Contact with Patient 11/13/15 1804     Chief Complaint  Patient presents with  . URI   (Consider location/radiation/quality/duration/timing/severity/associated sxs/prior Treatment) HPI History obtained from patient:   LOCATION:upper respiratory SEVERITY: DURATION:2 days CONTEXT:sudden onset QUALITY: MODIFYING FACTORS: ASSOCIATED SYMPTOMS:ears feel clogged TIMING:constant OCCUPATION:  Past Medical History  Diagnosis Date  . Diabetes mellitus without complication (HCC)   . Cataract    History reviewed. No pertinent past surgical history. No family history on file. Social History  Substance Use Topics  . Smoking status: Never Smoker   . Smokeless tobacco: None  . Alcohol Use: No   OB History    No data available     Review of Systems ROS +'ve URI symptom Denies: HEADACHE, NAUSEA, ABDOMINAL PAIN, CHEST PAIN, CONGESTION, DYSURIA, SHORTNESS OF BREATH  Allergies  Review of patient's allergies indicates no known allergies.  Home Medications   Prior to Admission medications   Medication Sig Start Date End Date Taking? Authorizing Provider  hydrochlorothiazide (HYDRODIURIL) 12.5 MG tablet Take 12.5 mg by mouth daily.   Yes Historical Provider, MD  metFORMIN (GLUCOPHAGE) 500 MG tablet Take 500 mg by mouth 2 (two) times daily with a meal.   Yes Historical Provider, MD  sulfamethoxazole-trimethoprim (BACTRIM DS,SEPTRA DS) 800-160 MG per tablet Take 1 tablet by mouth 2 (two) times daily. 01/17/13   Peyton Najjar, MD   Meds Ordered and Administered this Visit  Medications - No data to display  There were no vitals taken for this visit. No data found.   Physical Exam  Constitutional: She is oriented to person, place, and time. She appears well-developed and well-nourished.  HENT:  Head: Normocephalic and atraumatic.  Right Ear: Tympanic membrane is erythematous and bulging. Tympanic  membrane mobility is abnormal.  Left Ear: Tympanic membrane is erythematous and bulging. Tympanic membrane mobility is abnormal.  Mouth/Throat: Oropharynx is clear and moist. No oropharyngeal exudate.  Cardiovascular: Normal rate.   Pulmonary/Chest: Effort normal and breath sounds normal.  Abdominal: Soft.  Musculoskeletal: Normal range of motion.  Neurological: She is alert and oriented to person, place, and time.  Skin: Skin is warm and dry.  Psychiatric: She has a normal mood and affect. Her behavior is normal.  Nursing note and vitals reviewed.   ED Course  Procedures (including critical care time)  Labs Review Labs Reviewed - No data to display  Imaging Review No results found.   Visual Acuity Review  Right Eye Distance:   Left Eye Distance:   Bilateral Distance:    Right Eye Near:   Left Eye Near:    Bilateral Near:         MDM   1. Essential hypertension   2. Bilateral acute otitis media, recurrence not specified, unspecified otitis media type    Patient is advised to continue home symptomatic treatment. Prescription for amoxicillin, Flonase sent pharmacy patient has indicated. Patient is advised that if there are new or worsening symptoms or attend the emergency department, or contact primary care provider. Instructions of care provided discharged home in stable condition. Return to work/school note provided.  THIS NOTE WAS GENERATED USING A VOICE RECOGNITION SOFTWARE PROGRAM. ALL REASONABLE EFFORTS  WERE MADE TO PROOFREAD THIS DOCUMENT FOR ACCURACY.\      Tharon Aquas, PA 11/13/15 1919  Tharon Aquas, [place] 11/13/15 1921

## 2015-11-28 DIAGNOSIS — H938X2 Other specified disorders of left ear: Secondary | ICD-10-CM | POA: Diagnosis not present

## 2015-11-28 DIAGNOSIS — I10 Essential (primary) hypertension: Secondary | ICD-10-CM | POA: Diagnosis not present

## 2015-11-28 DIAGNOSIS — E785 Hyperlipidemia, unspecified: Secondary | ICD-10-CM | POA: Diagnosis not present

## 2015-11-28 DIAGNOSIS — Z1389 Encounter for screening for other disorder: Secondary | ICD-10-CM | POA: Diagnosis not present

## 2015-11-28 DIAGNOSIS — E089 Diabetes mellitus due to underlying condition without complications: Secondary | ICD-10-CM | POA: Diagnosis not present

## 2015-12-26 DIAGNOSIS — R739 Hyperglycemia, unspecified: Secondary | ICD-10-CM | POA: Diagnosis not present

## 2015-12-26 DIAGNOSIS — N390 Urinary tract infection, site not specified: Secondary | ICD-10-CM | POA: Diagnosis not present

## 2015-12-26 DIAGNOSIS — Z0000 Encounter for general adult medical examination without abnormal findings: Secondary | ICD-10-CM | POA: Diagnosis not present

## 2015-12-26 DIAGNOSIS — I10 Essential (primary) hypertension: Secondary | ICD-10-CM | POA: Diagnosis not present

## 2015-12-31 DIAGNOSIS — N760 Acute vaginitis: Secondary | ICD-10-CM | POA: Diagnosis not present

## 2015-12-31 DIAGNOSIS — E7800 Pure hypercholesterolemia, unspecified: Secondary | ICD-10-CM | POA: Diagnosis not present

## 2015-12-31 DIAGNOSIS — R739 Hyperglycemia, unspecified: Secondary | ICD-10-CM | POA: Diagnosis not present

## 2015-12-31 DIAGNOSIS — I10 Essential (primary) hypertension: Secondary | ICD-10-CM | POA: Diagnosis not present

## 2016-01-02 DIAGNOSIS — L292 Pruritus vulvae: Secondary | ICD-10-CM | POA: Diagnosis not present

## 2016-01-02 DIAGNOSIS — L308 Other specified dermatitis: Secondary | ICD-10-CM | POA: Diagnosis not present

## 2016-01-02 DIAGNOSIS — B373 Candidiasis of vulva and vagina: Secondary | ICD-10-CM | POA: Diagnosis not present

## 2016-01-02 DIAGNOSIS — Z124 Encounter for screening for malignant neoplasm of cervix: Secondary | ICD-10-CM | POA: Diagnosis not present

## 2016-01-02 DIAGNOSIS — Z6830 Body mass index (BMI) 30.0-30.9, adult: Secondary | ICD-10-CM | POA: Diagnosis not present

## 2016-01-28 DIAGNOSIS — L292 Pruritus vulvae: Secondary | ICD-10-CM | POA: Diagnosis not present

## 2016-03-26 DIAGNOSIS — I10 Essential (primary) hypertension: Secondary | ICD-10-CM | POA: Diagnosis not present

## 2016-03-26 DIAGNOSIS — R739 Hyperglycemia, unspecified: Secondary | ICD-10-CM | POA: Diagnosis not present

## 2016-03-31 DIAGNOSIS — Z0000 Encounter for general adult medical examination without abnormal findings: Secondary | ICD-10-CM | POA: Diagnosis not present

## 2016-03-31 DIAGNOSIS — E785 Hyperlipidemia, unspecified: Secondary | ICD-10-CM | POA: Diagnosis not present

## 2016-03-31 DIAGNOSIS — I10 Essential (primary) hypertension: Secondary | ICD-10-CM | POA: Diagnosis not present

## 2016-03-31 DIAGNOSIS — E119 Type 2 diabetes mellitus without complications: Secondary | ICD-10-CM | POA: Diagnosis not present

## 2016-04-02 DIAGNOSIS — E119 Type 2 diabetes mellitus without complications: Secondary | ICD-10-CM | POA: Diagnosis not present

## 2016-04-02 DIAGNOSIS — R739 Hyperglycemia, unspecified: Secondary | ICD-10-CM | POA: Diagnosis not present

## 2016-04-02 DIAGNOSIS — E7800 Pure hypercholesterolemia, unspecified: Secondary | ICD-10-CM | POA: Diagnosis not present

## 2016-04-02 DIAGNOSIS — I10 Essential (primary) hypertension: Secondary | ICD-10-CM | POA: Diagnosis not present

## 2016-04-02 DIAGNOSIS — Z720 Tobacco use: Secondary | ICD-10-CM | POA: Diagnosis not present

## 2016-04-28 DIAGNOSIS — L292 Pruritus vulvae: Secondary | ICD-10-CM | POA: Diagnosis not present

## 2016-05-26 DIAGNOSIS — H40013 Open angle with borderline findings, low risk, bilateral: Secondary | ICD-10-CM | POA: Diagnosis not present

## 2016-05-26 DIAGNOSIS — E119 Type 2 diabetes mellitus without complications: Secondary | ICD-10-CM | POA: Diagnosis not present

## 2016-05-26 DIAGNOSIS — H2513 Age-related nuclear cataract, bilateral: Secondary | ICD-10-CM | POA: Diagnosis not present

## 2016-05-26 DIAGNOSIS — H40051 Ocular hypertension, right eye: Secondary | ICD-10-CM | POA: Diagnosis not present

## 2016-06-23 DIAGNOSIS — H40051 Ocular hypertension, right eye: Secondary | ICD-10-CM | POA: Diagnosis not present

## 2016-06-23 DIAGNOSIS — H40011 Open angle with borderline findings, low risk, right eye: Secondary | ICD-10-CM | POA: Diagnosis not present

## 2016-07-23 DIAGNOSIS — I10 Essential (primary) hypertension: Secondary | ICD-10-CM | POA: Diagnosis not present

## 2016-07-23 DIAGNOSIS — E119 Type 2 diabetes mellitus without complications: Secondary | ICD-10-CM | POA: Diagnosis not present

## 2016-07-28 DIAGNOSIS — I10 Essential (primary) hypertension: Secondary | ICD-10-CM | POA: Diagnosis not present

## 2016-07-28 DIAGNOSIS — R739 Hyperglycemia, unspecified: Secondary | ICD-10-CM | POA: Diagnosis not present

## 2016-07-28 DIAGNOSIS — E7800 Pure hypercholesterolemia, unspecified: Secondary | ICD-10-CM | POA: Diagnosis not present

## 2016-08-04 DIAGNOSIS — H401111 Primary open-angle glaucoma, right eye, mild stage: Secondary | ICD-10-CM | POA: Diagnosis not present

## 2016-08-04 DIAGNOSIS — H40051 Ocular hypertension, right eye: Secondary | ICD-10-CM | POA: Diagnosis not present

## 2016-08-04 DIAGNOSIS — H40022 Open angle with borderline findings, high risk, left eye: Secondary | ICD-10-CM | POA: Diagnosis not present

## 2016-10-20 DIAGNOSIS — L810 Postinflammatory hyperpigmentation: Secondary | ICD-10-CM | POA: Diagnosis not present

## 2016-11-10 DIAGNOSIS — Z1231 Encounter for screening mammogram for malignant neoplasm of breast: Secondary | ICD-10-CM | POA: Diagnosis not present

## 2016-11-10 DIAGNOSIS — Z803 Family history of malignant neoplasm of breast: Secondary | ICD-10-CM | POA: Diagnosis not present

## 2016-11-24 DIAGNOSIS — R739 Hyperglycemia, unspecified: Secondary | ICD-10-CM | POA: Diagnosis not present

## 2016-11-24 DIAGNOSIS — E119 Type 2 diabetes mellitus without complications: Secondary | ICD-10-CM | POA: Diagnosis not present

## 2016-11-24 DIAGNOSIS — I10 Essential (primary) hypertension: Secondary | ICD-10-CM | POA: Diagnosis not present

## 2016-12-01 DIAGNOSIS — E7800 Pure hypercholesterolemia, unspecified: Secondary | ICD-10-CM | POA: Diagnosis not present

## 2016-12-01 DIAGNOSIS — R739 Hyperglycemia, unspecified: Secondary | ICD-10-CM | POA: Diagnosis not present

## 2016-12-01 DIAGNOSIS — Z0000 Encounter for general adult medical examination without abnormal findings: Secondary | ICD-10-CM | POA: Diagnosis not present

## 2016-12-01 DIAGNOSIS — M79602 Pain in left arm: Secondary | ICD-10-CM | POA: Diagnosis not present

## 2016-12-01 DIAGNOSIS — I10 Essential (primary) hypertension: Secondary | ICD-10-CM | POA: Diagnosis not present

## 2016-12-29 DIAGNOSIS — E669 Obesity, unspecified: Secondary | ICD-10-CM | POA: Diagnosis not present

## 2016-12-29 DIAGNOSIS — Z7984 Long term (current) use of oral hypoglycemic drugs: Secondary | ICD-10-CM | POA: Diagnosis not present

## 2016-12-29 DIAGNOSIS — Z6833 Body mass index (BMI) 33.0-33.9, adult: Secondary | ICD-10-CM | POA: Diagnosis not present

## 2016-12-29 DIAGNOSIS — G3184 Mild cognitive impairment, so stated: Secondary | ICD-10-CM | POA: Diagnosis not present

## 2016-12-29 DIAGNOSIS — R600 Localized edema: Secondary | ICD-10-CM | POA: Diagnosis not present

## 2016-12-29 DIAGNOSIS — I10 Essential (primary) hypertension: Secondary | ICD-10-CM | POA: Diagnosis not present

## 2016-12-29 DIAGNOSIS — E119 Type 2 diabetes mellitus without complications: Secondary | ICD-10-CM | POA: Diagnosis not present

## 2016-12-29 DIAGNOSIS — E785 Hyperlipidemia, unspecified: Secondary | ICD-10-CM | POA: Diagnosis not present

## 2016-12-29 DIAGNOSIS — J302 Other seasonal allergic rhinitis: Secondary | ICD-10-CM | POA: Diagnosis not present

## 2016-12-29 DIAGNOSIS — Z0000 Encounter for general adult medical examination without abnormal findings: Secondary | ICD-10-CM | POA: Diagnosis not present

## 2017-02-09 DIAGNOSIS — E119 Type 2 diabetes mellitus without complications: Secondary | ICD-10-CM | POA: Diagnosis not present

## 2017-02-09 DIAGNOSIS — H401111 Primary open-angle glaucoma, right eye, mild stage: Secondary | ICD-10-CM | POA: Diagnosis not present

## 2017-02-09 DIAGNOSIS — H40022 Open angle with borderline findings, high risk, left eye: Secondary | ICD-10-CM | POA: Diagnosis not present

## 2017-02-09 DIAGNOSIS — H40053 Ocular hypertension, bilateral: Secondary | ICD-10-CM | POA: Diagnosis not present

## 2017-02-23 DIAGNOSIS — L03011 Cellulitis of right finger: Secondary | ICD-10-CM | POA: Diagnosis not present

## 2017-03-23 DIAGNOSIS — H40022 Open angle with borderline findings, high risk, left eye: Secondary | ICD-10-CM | POA: Diagnosis not present

## 2017-03-23 DIAGNOSIS — H401111 Primary open-angle glaucoma, right eye, mild stage: Secondary | ICD-10-CM | POA: Diagnosis not present

## 2017-03-23 DIAGNOSIS — H40053 Ocular hypertension, bilateral: Secondary | ICD-10-CM | POA: Diagnosis not present

## 2017-03-30 ENCOUNTER — Ambulatory Visit (INDEPENDENT_AMBULATORY_CARE_PROVIDER_SITE_OTHER): Payer: Medicare HMO | Admitting: Podiatry

## 2017-03-30 DIAGNOSIS — B353 Tinea pedis: Secondary | ICD-10-CM | POA: Diagnosis not present

## 2017-03-30 DIAGNOSIS — L988 Other specified disorders of the skin and subcutaneous tissue: Secondary | ICD-10-CM | POA: Diagnosis not present

## 2017-03-30 MED ORDER — TERBINAFINE HCL 250 MG PO TABS: 250.0000 mg | ORAL_TABLET | Freq: Every day | ORAL | 0 refills | Status: DC

## 2017-03-30 NOTE — Progress Notes (Signed)
   HPI: 76 year old female presents to the office today for evaluation of interdigital maceration to the bilateral feet. Patient states an ongoing for a few months now and she has itching and she cannot understand why her skin is always wet and flaking in between her digits in the first interdigital webspace. Patient presents today for further treatment and evaluation   Physical Exam: General: The patient is alert and oriented x3 in no acute distress.  Dermatology: Interdigital maceration noted to the first webspace of the bilateral feet. There is some hyperkeratotic macerated tissue and also noted. No open lesions at the moment.  Vascular: Palpable pedal pulses bilaterally. No edema or erythema noted. Capillary refill within normal limits.  Neurological: Epicritic and protective threshold grossly intact bilaterally.   Musculoskeletal Exam: Range of motion within normal limits to all pedal and ankle joints bilateral. Muscle strength 5/5 in all groups bilateral.   Assessment: 1. Tinea pedis with interdigital maceration first webspace bilateral feet 2. Pruritus bilateral feet  Plan of Care:  1. Patient was evaluated. 2. Prescription for terbinafine 250 mg #28 3. Castellani paint applied today. Recommend going [facility] the [facility] twice a day 4. Return to clinic in 4 weeks   Felecia ShellingBrent M. Evans, DPM [facility]  Dr. Felecia ShellingBrent M. Evans, DPM    [address]                                        [place], [place] [address]                Office [phone]) [phone]  Fax [phone]

## 2017-05-04 ENCOUNTER — Ambulatory Visit (INDEPENDENT_AMBULATORY_CARE_PROVIDER_SITE_OTHER): Payer: Medicare HMO | Admitting: Podiatry

## 2017-05-04 DIAGNOSIS — L988 Other specified disorders of the skin and subcutaneous tissue: Secondary | ICD-10-CM

## 2017-05-04 DIAGNOSIS — B353 Tinea pedis: Secondary | ICD-10-CM

## 2017-05-04 NOTE — Progress Notes (Signed)
   HPI: 76 year old female presents to the office today for followup evaluation of interdigital maceration to the bilateral feet.  Patient believes that there has been some improvement to the interdigital maceration. She completed her oral Lamisil 4 weeks. She is also been applying Castellani paint 2 times daily.  Physical Exam: General: The patient is alert and oriented x3 in no acute distress.  Dermatology: Interdigital maceration noted to the first webspace of the bilateral feet. There is some hyperkeratotic macerated tissue and also noted. No open lesions at the moment.  Vascular: Palpable pedal pulses bilaterally. No edema or erythema noted. Capillary refill within normal limits.  Neurological: Epicritic and protective threshold grossly intact bilaterally.   Musculoskeletal Exam: Range of motion within normal limits to all pedal and ankle joints bilateral. Muscle strength 5/5 in all groups bilateral.   Assessment: 1. Tinea pedis with interdigital maceration first webspace bilateral feet 2. Pruritus bilateral feet  Plan of Care:  1. Patient was evaluated. 2. Recommend the patient continues [employer] daily. Patient does not need to apply twice daily. 3. Recommend good foot hygiene 4. Return to clinic when necessary  Felecia ShellingBrent M. Rain [place], DPM [facility]  Dr. Felecia ShellingBrent M. Caleen Taaffe, DPM    [address]                                        [place], [place] [address]                Office [phone]) [phone]  Fax [phone]

## 2017-05-09 DIAGNOSIS — L03011 Cellulitis of right finger: Secondary | ICD-10-CM | POA: Diagnosis not present

## 2017-05-18 DIAGNOSIS — E7800 Pure hypercholesterolemia, unspecified: Secondary | ICD-10-CM | POA: Diagnosis not present

## 2017-05-18 DIAGNOSIS — Z780 Asymptomatic menopausal state: Secondary | ICD-10-CM | POA: Diagnosis not present

## 2017-05-18 DIAGNOSIS — E119 Type 2 diabetes mellitus without complications: Secondary | ICD-10-CM | POA: Diagnosis not present

## 2017-05-18 DIAGNOSIS — R634 Abnormal weight loss: Secondary | ICD-10-CM | POA: Diagnosis not present

## 2017-05-18 DIAGNOSIS — I10 Essential (primary) hypertension: Secondary | ICD-10-CM | POA: Diagnosis not present

## 2017-05-18 DIAGNOSIS — R739 Hyperglycemia, unspecified: Secondary | ICD-10-CM | POA: Diagnosis not present

## 2017-05-25 DIAGNOSIS — Z0000 Encounter for general adult medical examination without abnormal findings: Secondary | ICD-10-CM | POA: Diagnosis not present

## 2017-05-25 DIAGNOSIS — I10 Essential (primary) hypertension: Secondary | ICD-10-CM | POA: Diagnosis not present

## 2017-05-25 DIAGNOSIS — R739 Hyperglycemia, unspecified: Secondary | ICD-10-CM | POA: Diagnosis not present

## 2017-05-25 DIAGNOSIS — E7800 Pure hypercholesterolemia, unspecified: Secondary | ICD-10-CM | POA: Diagnosis not present

## 2017-07-20 DIAGNOSIS — H401111 Primary open-angle glaucoma, right eye, mild stage: Secondary | ICD-10-CM | POA: Diagnosis not present

## 2017-07-20 DIAGNOSIS — H40022 Open angle with borderline findings, high risk, left eye: Secondary | ICD-10-CM | POA: Diagnosis not present

## 2017-07-20 DIAGNOSIS — H40053 Ocular hypertension, bilateral: Secondary | ICD-10-CM | POA: Diagnosis not present

## 2017-07-24 DIAGNOSIS — L03011 Cellulitis of right finger: Secondary | ICD-10-CM | POA: Diagnosis not present

## 2017-07-24 DIAGNOSIS — M79641 Pain in right hand: Secondary | ICD-10-CM | POA: Diagnosis not present

## 2017-08-03 DIAGNOSIS — M1990 Unspecified osteoarthritis, unspecified site: Secondary | ICD-10-CM | POA: Diagnosis not present

## 2017-08-03 DIAGNOSIS — M79643 Pain in unspecified hand: Secondary | ICD-10-CM | POA: Diagnosis not present

## 2017-08-03 DIAGNOSIS — M180 Bilateral primary osteoarthritis of first carpometacarpal joints: Secondary | ICD-10-CM | POA: Diagnosis not present

## 2017-08-03 DIAGNOSIS — Z23 Encounter for immunization: Secondary | ICD-10-CM | POA: Diagnosis not present

## 2017-08-03 DIAGNOSIS — L03019 Cellulitis of unspecified finger: Secondary | ICD-10-CM | POA: Diagnosis not present

## 2017-08-03 DIAGNOSIS — M79642 Pain in left hand: Secondary | ICD-10-CM | POA: Diagnosis not present

## 2017-08-03 DIAGNOSIS — M79641 Pain in right hand: Secondary | ICD-10-CM | POA: Diagnosis not present

## 2017-08-03 DIAGNOSIS — M6530 Trigger finger, unspecified finger: Secondary | ICD-10-CM | POA: Diagnosis not present

## 2017-08-24 DIAGNOSIS — L03019 Cellulitis of unspecified finger: Secondary | ICD-10-CM | POA: Diagnosis not present

## 2017-08-24 DIAGNOSIS — M79643 Pain in unspecified hand: Secondary | ICD-10-CM | POA: Diagnosis not present

## 2017-08-24 DIAGNOSIS — M6530 Trigger finger, unspecified finger: Secondary | ICD-10-CM | POA: Diagnosis not present

## 2017-08-24 DIAGNOSIS — M0579 Rheumatoid arthritis with rheumatoid factor of multiple sites without organ or systems involvement: Secondary | ICD-10-CM | POA: Diagnosis not present

## 2017-10-08 ENCOUNTER — Encounter (HOSPITAL_COMMUNITY): Payer: Self-pay | Admitting: *Deleted

## 2017-10-08 ENCOUNTER — Emergency Department (HOSPITAL_COMMUNITY)
Admission: EM | Admit: 2017-10-08 | Discharge: 2017-10-08 | Disposition: A | Discharge status: Home / Self Care | Payer: Medicare HMO | Attending: Emergency Medicine | Admitting: Emergency Medicine

## 2017-10-08 DIAGNOSIS — Z79899 Other long term (current) drug therapy: Secondary | ICD-10-CM | POA: Diagnosis not present

## 2017-10-08 DIAGNOSIS — L235 Allergic contact dermatitis due to other chemical products: Secondary | ICD-10-CM | POA: Insufficient documentation

## 2017-10-08 DIAGNOSIS — Z7984 Long term (current) use of oral hypoglycemic drugs: Secondary | ICD-10-CM | POA: Insufficient documentation

## 2017-10-08 DIAGNOSIS — E119 Type 2 diabetes mellitus without complications: Secondary | ICD-10-CM | POA: Diagnosis not present

## 2017-10-08 DIAGNOSIS — R600 Localized edema: Secondary | ICD-10-CM | POA: Diagnosis present

## 2017-10-08 DIAGNOSIS — T7840XA Allergy, unspecified, initial encounter: Secondary | ICD-10-CM

## 2017-10-08 MED ORDER — METHYLPREDNISOLONE SODIUM SUCC 125 MG IJ SOLR
125.0000 mg | Freq: Once | INTRAMUSCULAR | Status: AC
  Administered 2017-10-08: 125 mg via INTRAVENOUS
  Filled 2017-10-08: qty 2

## 2017-10-08 MED ORDER — DIPHENHYDRAMINE HCL 50 MG/ML IJ SOLN
25.0000 mg | Freq: Once | INTRAMUSCULAR | Status: AC
  Administered 2017-10-08: 25 mg via INTRAVENOUS
  Filled 2017-10-08: qty 1

## 2017-10-08 MED ORDER — SODIUM CHLORIDE 0.9 % IV BOLUS (SEPSIS)
500.0000 mL | Freq: Once | INTRAVENOUS | Status: AC
Start: 2017-10-08 — End: 2017-10-08
  Administered 2017-10-08: 500 mL via INTRAVENOUS

## 2017-10-08 MED ORDER — PREDNISONE 10 MG PO TABS: 10.0000 mg | ORAL_TABLET | Freq: Every day | ORAL | 0 refills | Status: DC

## 2017-10-08 MED ORDER — FAMOTIDINE IN NACL 20-0.9 MG/50ML-% IV SOLN
20.0000 mg | Freq: Once | INTRAVENOUS | Status: AC
  Administered 2017-10-08: 20 mg via INTRAVENOUS
  Filled 2017-10-08: qty 50

## 2017-10-08 NOTE — ED Triage Notes (Signed)
Pt states that she had a perm on 12/31. Pt state that she has had progressive swelling in her eyes. Pt is noted to have reddness and swelling to scalp as well.

## 2017-10-08 NOTE — ED Provider Notes (Signed)
[facility] EMERGENCY DEPARTMENT Provider Note   CSN: [id number] Arrival date & time: 10/08/17  0754     History   Chief Complaint Chief Complaint  Patient presents with  . Facial Swelling    HPI Sharon Gilbert is a 77 y.o. female.  Pruritic rash on scalp, face, eyelids after applying hair treatment chemical on New Year's Eve.  No throat tightness, wheezing, shortness of breath.  She has tried nothing at home.  Severity of symptoms is moderate.  Nothing makes symptoms better or worse.      Past Medical History:  Diagnosis Date  . Cataract   . Diabetes mellitus without complication (HCC)     There are no active problems to display for this patient.   History reviewed. No pertinent surgical history.  OB History    No data available       Home Medications    Prior to Admission medications   Medication Sig Start Date End Date Taking? Authorizing Provider  fluticasone (FLONASE) 50 MCG/ACT nasal spray Place 2 sprays into both nostrils daily. 11/13/15   Tharon Aquas, PA  lisinopril-hydrochlorothiazide (PRINZIDE,ZESTORETIC) 20-12.5 MG tablet TK 1 T PO QD 02/21/17   [provider]  metFORMIN (GLUCOPHAGE) 500 MG tablet Take 500 mg by mouth 2 (two) times daily with a meal.    [provider]  predniSONE (DELTASONE) 10 MG tablet Take 1 tablet (10 mg total) by mouth daily with breakfast. 2 tablets for 4 days; 1 tablet for 4 days 10/08/17   Donnetta Hutching, MD  terbinafine (LAMISIL) 250 MG tablet Take 1 tablet (250 mg total) by mouth daily. 03/30/17   Felecia Shelling, DPM    Family History No family history on file.  Social History Social History   Tobacco Use  . Smoking status: Never Smoker  Substance Use Topics  . Alcohol use: No  . Drug use: No     Allergies   Patient has no known allergies.   Review of Systems Review of Systems  All other systems reviewed and are negative.    Physical Exam Updated Vital Signs BP 137/65    Pulse 76   Temp 98.1 F (36.7 C) (Oral)   Resp 17   SpO2 96%   Physical Exam  Constitutional: She is oriented to person, place, and time. She appears well-developed and well-nourished.  HENT:  Head: Normocephalic and atraumatic.  Eyes: Conjunctivae are normal.  Neck: Neck supple.  Cardiovascular: Normal rate and regular rhythm.  Pulmonary/Chest: Effort normal and breath sounds normal.  Abdominal: Soft. Bowel sounds are normal.  Musculoskeletal: Normal range of motion.  Neurological: She is alert and oriented to person, place, and time.  Skin:  HEENT: Scaly pruritic rash on scalp.  Eyelids are puffy bilaterally.  Minimal erythema.  Psychiatric: She has a normal mood and affect. Her behavior is normal.  Nursing note and vitals reviewed.    ED Treatments / Results  Labs (all labs ordered are listed, but only abnormal results are displayed) Labs Reviewed - No data to display  EKG  EKG Interpretation None       Radiology No results found.  Procedures Procedures (including critical care time)  Medications Ordered in ED Medications  methylPREDNISolone sodium succinate (SOLU-MEDROL) 125 mg/2 mL injection 125 mg (125 mg Intravenous Given 10/08/17 0944)  famotidine (PEPCID) IVPB 20 mg premix (0 mg Intravenous Stopped 10/08/17 1032)  diphenhydrAMINE (BENADRYL) injection 25 mg (25 mg Intravenous Given 10/08/17 0945)  sodium chloride  0.9 % bolus 500 mL (0 mLs Intravenous Stopped 10/08/17 1127)     Initial Impression / Assessment and Plan / ED Course  I have reviewed the triage vital signs and the nursing notes.  Pertinent labs & imaging results that were available during my care of the patient were reviewed by me and considered in my medical decision making (see chart for details).     Patient presents with allergic dermatitis.  She responded well to IV steroids, IV Benadryl, IV Pepcid.  Will discharge home with prednisone.  Can also take Benadryl.  Discussed with  patient.  Final Clinical Impressions(s) / ED Diagnoses   Final diagnoses:  Allergic dermatitis due to other chemical product  Allergic reaction, initial encounter    ED Discharge Orders        Ordered    predniSONE (DELTASONE) 10 MG tablet  Daily with breakfast     10/08/17 1223       Donnetta Hutchingook, Austine Wiedeman, MD 10/08/17 1229

## 2017-10-08 NOTE — Discharge Instructions (Signed)
Avoid the hair chemicals that she used.  Prescription for prednisone.  Can also take Benadryl.  You can apply over-the-counter 1% hydrocortisone ointment to the skin.

## 2017-10-08 NOTE — ED Notes (Signed)
Pt reports improvement; both eyes do appear more open.

## 2017-10-08 NOTE — ED Notes (Signed)
Pt reports that her swelling is improving and able to open eyes more.

## 2017-10-08 NOTE — ED Notes (Signed)
Pt reporting improvement and stating she can now open both eyes.

## 2017-10-19 DIAGNOSIS — M0579 Rheumatoid arthritis with rheumatoid factor of multiple sites without organ or systems involvement: Secondary | ICD-10-CM | POA: Diagnosis not present

## 2017-10-19 DIAGNOSIS — M6530 Trigger finger, unspecified finger: Secondary | ICD-10-CM | POA: Diagnosis not present

## 2017-10-19 DIAGNOSIS — Z79899 Other long term (current) drug therapy: Secondary | ICD-10-CM | POA: Diagnosis not present

## 2017-10-19 DIAGNOSIS — M79643 Pain in unspecified hand: Secondary | ICD-10-CM | POA: Diagnosis not present

## 2017-11-09 DIAGNOSIS — G8929 Other chronic pain: Secondary | ICD-10-CM | POA: Diagnosis not present

## 2017-11-09 DIAGNOSIS — Z6831 Body mass index (BMI) 31.0-31.9, adult: Secondary | ICD-10-CM | POA: Diagnosis not present

## 2017-11-09 DIAGNOSIS — E119 Type 2 diabetes mellitus without complications: Secondary | ICD-10-CM | POA: Diagnosis not present

## 2017-11-09 DIAGNOSIS — I10 Essential (primary) hypertension: Secondary | ICD-10-CM | POA: Diagnosis not present

## 2017-11-09 DIAGNOSIS — Z7982 Long term (current) use of aspirin: Secondary | ICD-10-CM | POA: Diagnosis not present

## 2017-11-09 DIAGNOSIS — Z7984 Long term (current) use of oral hypoglycemic drugs: Secondary | ICD-10-CM | POA: Diagnosis not present

## 2017-11-09 DIAGNOSIS — Z720 Tobacco use: Secondary | ICD-10-CM | POA: Diagnosis not present

## 2017-11-09 DIAGNOSIS — H409 Unspecified glaucoma: Secondary | ICD-10-CM | POA: Diagnosis not present

## 2017-11-09 DIAGNOSIS — E785 Hyperlipidemia, unspecified: Secondary | ICD-10-CM | POA: Diagnosis not present

## 2017-11-09 DIAGNOSIS — E669 Obesity, unspecified: Secondary | ICD-10-CM | POA: Diagnosis not present

## 2017-11-23 DIAGNOSIS — I10 Essential (primary) hypertension: Secondary | ICD-10-CM | POA: Diagnosis not present

## 2017-11-23 DIAGNOSIS — M25561 Pain in right knee: Secondary | ICD-10-CM | POA: Diagnosis not present

## 2017-11-23 DIAGNOSIS — M0579 Rheumatoid arthritis with rheumatoid factor of multiple sites without organ or systems involvement: Secondary | ICD-10-CM | POA: Diagnosis not present

## 2017-11-23 DIAGNOSIS — R739 Hyperglycemia, unspecified: Secondary | ICD-10-CM | POA: Diagnosis not present

## 2017-11-23 DIAGNOSIS — M179 Osteoarthritis of knee, unspecified: Secondary | ICD-10-CM | POA: Diagnosis not present

## 2017-11-23 DIAGNOSIS — M6530 Trigger finger, unspecified finger: Secondary | ICD-10-CM | POA: Diagnosis not present

## 2017-11-23 DIAGNOSIS — M1990 Unspecified osteoarthritis, unspecified site: Secondary | ICD-10-CM | POA: Diagnosis not present

## 2017-11-23 DIAGNOSIS — Z79899 Other long term (current) drug therapy: Secondary | ICD-10-CM | POA: Diagnosis not present

## 2017-11-23 DIAGNOSIS — M79643 Pain in unspecified hand: Secondary | ICD-10-CM | POA: Diagnosis not present

## 2017-11-24 DIAGNOSIS — Z1231 Encounter for screening mammogram for malignant neoplasm of breast: Secondary | ICD-10-CM | POA: Diagnosis not present

## 2017-11-30 DIAGNOSIS — I10 Essential (primary) hypertension: Secondary | ICD-10-CM | POA: Diagnosis not present

## 2017-11-30 DIAGNOSIS — E119 Type 2 diabetes mellitus without complications: Secondary | ICD-10-CM | POA: Diagnosis not present

## 2017-11-30 DIAGNOSIS — E7800 Pure hypercholesterolemia, unspecified: Secondary | ICD-10-CM | POA: Diagnosis not present

## 2018-02-22 DIAGNOSIS — E119 Type 2 diabetes mellitus without complications: Secondary | ICD-10-CM | POA: Diagnosis not present

## 2018-02-22 DIAGNOSIS — M0579 Rheumatoid arthritis with rheumatoid factor of multiple sites without organ or systems involvement: Secondary | ICD-10-CM | POA: Diagnosis not present

## 2018-02-22 DIAGNOSIS — M25561 Pain in right knee: Secondary | ICD-10-CM | POA: Diagnosis not present

## 2018-02-22 DIAGNOSIS — M1990 Unspecified osteoarthritis, unspecified site: Secondary | ICD-10-CM | POA: Diagnosis not present

## 2018-02-22 DIAGNOSIS — E7800 Pure hypercholesterolemia, unspecified: Secondary | ICD-10-CM | POA: Diagnosis not present

## 2018-02-22 DIAGNOSIS — M79643 Pain in unspecified hand: Secondary | ICD-10-CM | POA: Diagnosis not present

## 2018-02-22 DIAGNOSIS — Z79899 Other long term (current) drug therapy: Secondary | ICD-10-CM | POA: Diagnosis not present

## 2018-03-08 DIAGNOSIS — Z8042 Family history of malignant neoplasm of prostate: Secondary | ICD-10-CM | POA: Diagnosis not present

## 2018-03-08 DIAGNOSIS — R739 Hyperglycemia, unspecified: Secondary | ICD-10-CM | POA: Diagnosis not present

## 2018-03-08 DIAGNOSIS — E7800 Pure hypercholesterolemia, unspecified: Secondary | ICD-10-CM | POA: Diagnosis not present

## 2018-03-08 DIAGNOSIS — Z803 Family history of malignant neoplasm of breast: Secondary | ICD-10-CM | POA: Diagnosis not present

## 2018-03-08 DIAGNOSIS — I10 Essential (primary) hypertension: Secondary | ICD-10-CM | POA: Diagnosis not present

## 2018-03-08 DIAGNOSIS — E119 Type 2 diabetes mellitus without complications: Secondary | ICD-10-CM | POA: Diagnosis not present

## 2018-03-08 DIAGNOSIS — Z86010 Personal history of colonic polyps: Secondary | ICD-10-CM | POA: Diagnosis not present

## 2018-03-08 DIAGNOSIS — Z1501 Genetic susceptibility to malignant neoplasm of breast: Secondary | ICD-10-CM | POA: Diagnosis not present

## 2018-03-12 DIAGNOSIS — E119 Type 2 diabetes mellitus without complications: Secondary | ICD-10-CM | POA: Diagnosis not present

## 2018-03-12 DIAGNOSIS — H40053 Ocular hypertension, bilateral: Secondary | ICD-10-CM | POA: Diagnosis not present

## 2018-03-12 DIAGNOSIS — H401111 Primary open-angle glaucoma, right eye, mild stage: Secondary | ICD-10-CM | POA: Diagnosis not present

## 2018-03-12 DIAGNOSIS — H40022 Open angle with borderline findings, high risk, left eye: Secondary | ICD-10-CM | POA: Diagnosis not present

## 2018-04-21 DIAGNOSIS — M25561 Pain in right knee: Secondary | ICD-10-CM | POA: Diagnosis not present

## 2018-04-21 DIAGNOSIS — Z79899 Other long term (current) drug therapy: Secondary | ICD-10-CM | POA: Diagnosis not present

## 2018-04-21 DIAGNOSIS — M79643 Pain in unspecified hand: Secondary | ICD-10-CM | POA: Diagnosis not present

## 2018-04-21 DIAGNOSIS — M1990 Unspecified osteoarthritis, unspecified site: Secondary | ICD-10-CM | POA: Diagnosis not present

## 2018-04-21 DIAGNOSIS — M0579 Rheumatoid arthritis with rheumatoid factor of multiple sites without organ or systems involvement: Secondary | ICD-10-CM | POA: Diagnosis not present

## 2018-06-21 DIAGNOSIS — I10 Essential (primary) hypertension: Secondary | ICD-10-CM | POA: Diagnosis not present

## 2018-06-21 DIAGNOSIS — R739 Hyperglycemia, unspecified: Secondary | ICD-10-CM | POA: Diagnosis not present

## 2018-06-22 DIAGNOSIS — B029 Zoster without complications: Secondary | ICD-10-CM | POA: Diagnosis not present

## 2018-06-22 DIAGNOSIS — B0229 Other postherpetic nervous system involvement: Secondary | ICD-10-CM | POA: Diagnosis not present

## 2018-06-28 DIAGNOSIS — Z23 Encounter for immunization: Secondary | ICD-10-CM | POA: Diagnosis not present

## 2018-06-28 DIAGNOSIS — E7800 Pure hypercholesterolemia, unspecified: Secondary | ICD-10-CM | POA: Diagnosis not present

## 2018-06-28 DIAGNOSIS — E119 Type 2 diabetes mellitus without complications: Secondary | ICD-10-CM | POA: Diagnosis not present

## 2018-06-28 DIAGNOSIS — Z0000 Encounter for general adult medical examination without abnormal findings: Secondary | ICD-10-CM | POA: Diagnosis not present

## 2018-06-28 DIAGNOSIS — I10 Essential (primary) hypertension: Secondary | ICD-10-CM | POA: Diagnosis not present

## 2018-07-29 DIAGNOSIS — M25561 Pain in right knee: Secondary | ICD-10-CM | POA: Diagnosis not present

## 2018-07-29 DIAGNOSIS — Z79899 Other long term (current) drug therapy: Secondary | ICD-10-CM | POA: Diagnosis not present

## 2018-07-29 DIAGNOSIS — M79643 Pain in unspecified hand: Secondary | ICD-10-CM | POA: Diagnosis not present

## 2018-07-29 DIAGNOSIS — E785 Hyperlipidemia, unspecified: Secondary | ICD-10-CM | POA: Diagnosis not present

## 2018-07-29 DIAGNOSIS — M0579 Rheumatoid arthritis with rheumatoid factor of multiple sites without organ or systems involvement: Secondary | ICD-10-CM | POA: Diagnosis not present

## 2018-07-29 DIAGNOSIS — E1169 Type 2 diabetes mellitus with other specified complication: Secondary | ICD-10-CM | POA: Diagnosis not present

## 2018-07-29 DIAGNOSIS — Z23 Encounter for immunization: Secondary | ICD-10-CM | POA: Diagnosis not present

## 2018-07-29 DIAGNOSIS — Z7189 Other specified counseling: Secondary | ICD-10-CM | POA: Diagnosis not present

## 2018-07-29 DIAGNOSIS — M1990 Unspecified osteoarthritis, unspecified site: Secondary | ICD-10-CM | POA: Diagnosis not present

## 2018-08-18 DIAGNOSIS — M79643 Pain in unspecified hand: Secondary | ICD-10-CM | POA: Diagnosis not present

## 2018-08-18 DIAGNOSIS — M0579 Rheumatoid arthritis with rheumatoid factor of multiple sites without organ or systems involvement: Secondary | ICD-10-CM | POA: Diagnosis not present

## 2018-08-18 DIAGNOSIS — E1169 Type 2 diabetes mellitus with other specified complication: Secondary | ICD-10-CM | POA: Diagnosis not present

## 2018-08-18 DIAGNOSIS — M79601 Pain in right arm: Secondary | ICD-10-CM | POA: Diagnosis not present

## 2018-08-18 DIAGNOSIS — M25561 Pain in right knee: Secondary | ICD-10-CM | POA: Diagnosis not present

## 2018-08-18 DIAGNOSIS — M1990 Unspecified osteoarthritis, unspecified site: Secondary | ICD-10-CM | POA: Diagnosis not present

## 2018-08-18 DIAGNOSIS — E785 Hyperlipidemia, unspecified: Secondary | ICD-10-CM | POA: Diagnosis not present

## 2018-08-18 DIAGNOSIS — Z79899 Other long term (current) drug therapy: Secondary | ICD-10-CM | POA: Diagnosis not present

## 2018-09-07 DIAGNOSIS — H401111 Primary open-angle glaucoma, right eye, mild stage: Secondary | ICD-10-CM | POA: Diagnosis not present

## 2018-09-07 DIAGNOSIS — H40053 Ocular hypertension, bilateral: Secondary | ICD-10-CM | POA: Diagnosis not present

## 2018-09-07 DIAGNOSIS — H40022 Open angle with borderline findings, high risk, left eye: Secondary | ICD-10-CM | POA: Diagnosis not present

## 2018-09-07 DIAGNOSIS — H524 Presbyopia: Secondary | ICD-10-CM | POA: Diagnosis not present

## 2018-09-22 DIAGNOSIS — L810 Postinflammatory hyperpigmentation: Secondary | ICD-10-CM | POA: Diagnosis not present

## 2018-10-21 DIAGNOSIS — R739 Hyperglycemia, unspecified: Secondary | ICD-10-CM | POA: Diagnosis not present

## 2018-10-21 DIAGNOSIS — E7800 Pure hypercholesterolemia, unspecified: Secondary | ICD-10-CM | POA: Diagnosis not present

## 2018-10-21 DIAGNOSIS — I10 Essential (primary) hypertension: Secondary | ICD-10-CM | POA: Diagnosis not present

## 2018-10-25 DIAGNOSIS — H40053 Ocular hypertension, bilateral: Secondary | ICD-10-CM | POA: Diagnosis not present

## 2018-10-25 DIAGNOSIS — H401111 Primary open-angle glaucoma, right eye, mild stage: Secondary | ICD-10-CM | POA: Diagnosis not present

## 2018-10-25 DIAGNOSIS — H40022 Open angle with borderline findings, high risk, left eye: Secondary | ICD-10-CM | POA: Diagnosis not present

## 2018-11-08 DIAGNOSIS — E119 Type 2 diabetes mellitus without complications: Secondary | ICD-10-CM | POA: Diagnosis not present

## 2018-11-08 DIAGNOSIS — R079 Chest pain, unspecified: Secondary | ICD-10-CM | POA: Diagnosis not present

## 2018-11-08 DIAGNOSIS — E7800 Pure hypercholesterolemia, unspecified: Secondary | ICD-10-CM | POA: Diagnosis not present

## 2018-11-08 DIAGNOSIS — I10 Essential (primary) hypertension: Secondary | ICD-10-CM | POA: Diagnosis not present

## 2018-12-01 DIAGNOSIS — Z803 Family history of malignant neoplasm of breast: Secondary | ICD-10-CM | POA: Diagnosis not present

## 2018-12-01 DIAGNOSIS — Z1231 Encounter for screening mammogram for malignant neoplasm of breast: Secondary | ICD-10-CM | POA: Diagnosis not present

## 2019-03-25 DIAGNOSIS — Z20828 Contact with and (suspected) exposure to other viral communicable diseases: Secondary | ICD-10-CM | POA: Diagnosis not present

## 2019-03-28 DIAGNOSIS — R69 Illness, unspecified: Secondary | ICD-10-CM | POA: Diagnosis not present

## 2019-04-06 DIAGNOSIS — R234 Changes in skin texture: Secondary | ICD-10-CM | POA: Diagnosis not present

## 2019-04-06 DIAGNOSIS — L299 Pruritus, unspecified: Secondary | ICD-10-CM | POA: Diagnosis not present

## 2019-05-10 DIAGNOSIS — I10 Essential (primary) hypertension: Secondary | ICD-10-CM | POA: Diagnosis not present

## 2019-05-10 DIAGNOSIS — E119 Type 2 diabetes mellitus without complications: Secondary | ICD-10-CM | POA: Diagnosis not present

## 2019-05-10 DIAGNOSIS — E7800 Pure hypercholesterolemia, unspecified: Secondary | ICD-10-CM | POA: Diagnosis not present

## 2019-05-16 DIAGNOSIS — E1169 Type 2 diabetes mellitus with other specified complication: Secondary | ICD-10-CM | POA: Diagnosis not present

## 2019-05-16 DIAGNOSIS — I10 Essential (primary) hypertension: Secondary | ICD-10-CM | POA: Diagnosis not present

## 2019-05-16 DIAGNOSIS — E785 Hyperlipidemia, unspecified: Secondary | ICD-10-CM | POA: Diagnosis not present

## 2019-05-27 DIAGNOSIS — Z0000 Encounter for general adult medical examination without abnormal findings: Secondary | ICD-10-CM | POA: Diagnosis not present

## 2019-05-27 DIAGNOSIS — I10 Essential (primary) hypertension: Secondary | ICD-10-CM | POA: Diagnosis not present

## 2019-05-27 DIAGNOSIS — E119 Type 2 diabetes mellitus without complications: Secondary | ICD-10-CM | POA: Diagnosis not present

## 2019-06-09 DIAGNOSIS — H04123 Dry eye syndrome of bilateral lacrimal glands: Secondary | ICD-10-CM | POA: Diagnosis not present

## 2019-06-09 DIAGNOSIS — H401111 Primary open-angle glaucoma, right eye, mild stage: Secondary | ICD-10-CM | POA: Diagnosis not present

## 2019-06-09 DIAGNOSIS — H524 Presbyopia: Secondary | ICD-10-CM | POA: Diagnosis not present

## 2019-06-09 DIAGNOSIS — H40022 Open angle with borderline findings, high risk, left eye: Secondary | ICD-10-CM | POA: Diagnosis not present

## 2019-06-09 DIAGNOSIS — E119 Type 2 diabetes mellitus without complications: Secondary | ICD-10-CM | POA: Diagnosis not present

## 2019-07-11 DIAGNOSIS — R69 Illness, unspecified: Secondary | ICD-10-CM | POA: Diagnosis not present

## 2019-08-03 DIAGNOSIS — L0390 Cellulitis, unspecified: Secondary | ICD-10-CM | POA: Diagnosis not present

## 2019-08-03 DIAGNOSIS — M2550 Pain in unspecified joint: Secondary | ICD-10-CM | POA: Diagnosis not present

## 2019-08-03 DIAGNOSIS — B351 Tinea unguium: Secondary | ICD-10-CM | POA: Diagnosis not present

## 2019-08-03 DIAGNOSIS — I10 Essential (primary) hypertension: Secondary | ICD-10-CM | POA: Diagnosis not present

## 2019-08-25 DIAGNOSIS — E119 Type 2 diabetes mellitus without complications: Secondary | ICD-10-CM | POA: Diagnosis not present

## 2019-08-25 DIAGNOSIS — I10 Essential (primary) hypertension: Secondary | ICD-10-CM | POA: Diagnosis not present

## 2019-08-25 DIAGNOSIS — E7800 Pure hypercholesterolemia, unspecified: Secondary | ICD-10-CM | POA: Diagnosis not present

## 2019-09-26 DIAGNOSIS — E119 Type 2 diabetes mellitus without complications: Secondary | ICD-10-CM | POA: Diagnosis not present

## 2019-09-26 DIAGNOSIS — E785 Hyperlipidemia, unspecified: Secondary | ICD-10-CM | POA: Diagnosis not present

## 2019-09-26 DIAGNOSIS — I10 Essential (primary) hypertension: Secondary | ICD-10-CM | POA: Diagnosis not present

## 2019-11-22 DIAGNOSIS — L03011 Cellulitis of right finger: Secondary | ICD-10-CM | POA: Diagnosis not present

## 2019-11-22 DIAGNOSIS — B351 Tinea unguium: Secondary | ICD-10-CM | POA: Diagnosis not present

## 2019-11-22 DIAGNOSIS — M0579 Rheumatoid arthritis with rheumatoid factor of multiple sites without organ or systems involvement: Secondary | ICD-10-CM | POA: Diagnosis not present

## 2019-12-06 DIAGNOSIS — H40022 Open angle with borderline findings, high risk, left eye: Secondary | ICD-10-CM | POA: Diagnosis not present

## 2019-12-06 DIAGNOSIS — H04123 Dry eye syndrome of bilateral lacrimal glands: Secondary | ICD-10-CM | POA: Diagnosis not present

## 2019-12-06 DIAGNOSIS — H401111 Primary open-angle glaucoma, right eye, mild stage: Secondary | ICD-10-CM | POA: Diagnosis not present

## 2019-12-07 DIAGNOSIS — L03011 Cellulitis of right finger: Secondary | ICD-10-CM | POA: Diagnosis not present

## 2019-12-07 DIAGNOSIS — B351 Tinea unguium: Secondary | ICD-10-CM | POA: Diagnosis not present

## 2020-01-02 DIAGNOSIS — Z1231 Encounter for screening mammogram for malignant neoplasm of breast: Secondary | ICD-10-CM | POA: Diagnosis not present

## 2020-01-28 ENCOUNTER — Ambulatory Visit (HOSPITAL_COMMUNITY)
Admission: EM | Admit: 2020-01-28 | Discharge: 2020-01-28 | Disposition: A | Discharge status: Home / Self Care | Payer: Medicare HMO | Attending: Family Medicine | Admitting: Family Medicine

## 2020-01-28 ENCOUNTER — Encounter (HOSPITAL_COMMUNITY): Payer: Self-pay

## 2020-01-28 ENCOUNTER — Other Ambulatory Visit: Payer: Self-pay

## 2020-01-28 DIAGNOSIS — L235 Allergic contact dermatitis due to other chemical products: Secondary | ICD-10-CM | POA: Diagnosis not present

## 2020-01-28 MED ORDER — PREDNISONE 10 MG (21) PO TBPK: ORAL_TABLET | ORAL | 0 refills | Status: AC

## 2020-01-28 MED ORDER — HYDROXYZINE HCL 25 MG PO TABS: 12.5000 mg | ORAL_TABLET | Freq: Three times a day (TID) | ORAL | 0 refills | Status: DC | PRN

## 2020-01-28 NOTE — Discharge Instructions (Signed)
Take the prednisone as directed -I would like for your first dose to be today you may split this up or take all the pills at once -Please be wary of how you are feeling while taking this medicine  If you are not improving over the next 24 to 48 hours please return.  If you are worsening, have difficulty breathing or swallowing please report immediately to the emergency department.  You may also take the hydroxyzine one half of a tablet as needed up to every 8 hours.  This can help with itching

## 2020-01-28 NOTE — ED Triage Notes (Addendum)
Pt states she has right eye swelling after getting her hair dyed on Thursday. Pt states she woke up this morning with this swelling to her face and scaple.

## 2020-01-28 NOTE — ED Provider Notes (Signed)
[facility]    CSN: [id number] Arrival date & time: 01/28/20  1128      History   Chief Complaint Chief Complaint  Patient presents with  . Allergic Reaction    HPI Sharon Gilbert is a 79 y.o. female.   Patient reports to urgent care for evaluation of allergic reaction.  She reports she had her hair dyed on Thursday which was 2 days ago started noticing itchy and swelling in her scalp.  Since then she has had swelling through her scalp and her upper face and to include her right eye.  She reports her right eye began swelling completely this morning.  She denies any pain however endorses itching.  She denies difficulty breathing or sensation of throat swelling.  She reports this is happened before when she has tried hair dyes and she believes this was a few years ago.   On chart review she was in the emergency department in 2019 for similar outcome.  She responded well to steroids at that time.     Past Medical History:  Diagnosis Date  . Cataract   . Diabetes mellitus without complication ([place])     There are no problems to display for this patient.   History reviewed. No pertinent surgical history.  OB History   No obstetric history on file.      Home Medications    Prior to Admission medications   Medication Sig Start Date End Date Taking? Authorizing Provider  fluticasone (FLONASE) 50 MCG/ACT nasal spray Place 2 sprays into both nostrils daily. 11/13/15   Konrad Felix, PA  hydrOXYzine (ATARAX/VISTARIL) 25 MG tablet Take 0.5 tablets (12.5 mg total) by mouth every 8 (eight) hours as needed. 01/28/20   Zalma Channing, Marguerita Beards, PA-C  lisinopril-hydrochlorothiazide (PRINZIDE,ZESTORETIC) 20-12.5 MG tablet TK 1 T PO QD 02/21/17   [provider]  metFORMIN (GLUCOPHAGE) 500 MG tablet Take 500 mg by mouth 2 (two) times daily with a meal.    [provider]  predniSONE (STERAPRED UNI-PAK 21 TAB) 10 MG (21) TBPK tablet Take 6 tablets (60 mg total) by  mouth daily for 1 day, THEN 5 tablets (50 mg total) daily for 1 day, THEN 4 tablets (40 mg total) daily for 1 day, THEN 3 tablets (30 mg total) daily for 1 day, THEN 2 tablets (20 mg total) daily for 1 day, THEN 1 tablet (10 mg total) daily for 1 day. 01/28/20 02/03/20  Bettylou Frew, Marguerita Beards, PA-C  terbinafine (LAMISIL) 250 MG tablet Take 1 tablet (250 mg total) by mouth daily. 03/30/17   Edrick Kins, DPM    Family History History reviewed. No pertinent family history.  Social History Social History   Tobacco Use  . Smoking status: Never Smoker  . Smokeless tobacco: Never Used  Substance Use Topics  . Alcohol use: No  . Drug use: No     Allergies   Patient has no known allergies.   Review of Systems Review of Systems   Physical Exam Triage Vital Signs ED Triage Vitals  Enc Vitals Group     BP 01/28/20 1229 (!) 146/65     Pulse Rate 01/28/20 1229 85     Resp 01/28/20 1229 18     Temp 01/28/20 1229 98.8 F (37.1 C)     Temp src --      SpO2 01/28/20 1229 99 %     Weight 01/28/20 1230 165 lb (74.8 kg)     Height --  Head Circumference --      Peak Flow --      Pain Score 01/28/20 1229 6     Pain Loc --      Pain Edu? --      Excl. in GC? --    No data found.  Updated Vital Signs BP (!) 146/65 (BP Location: Right Arm)   Pulse 85   Temp 98.8 F (37.1 C)   Resp 18   Wt 165 lb (74.8 kg)   SpO2 99%   BMI 29.23 kg/m   Visual Acuity Right Eye Distance:   Left Eye Distance:   Bilateral Distance:    Right Eye Near:   Left Eye Near:    Bilateral Near:     Physical Exam Vitals and nursing note reviewed.  Constitutional:      General: She is not in acute distress.    Appearance: She is well-developed. She is not ill-appearing.  HENT:     Head: Normocephalic and atraumatic.  Eyes:     Comments: Right eye with significant periorbital edema.  Able to open the eye manually.  Nontender.  There is edema also spreading towards the scalp on the right side.  Mild left  superior eyelid edema however eyes open.  Cardiovascular:     Rate and Rhythm: Normal rate and regular rhythm.     Heart sounds: No murmur.  Pulmonary:     Effort: Pulmonary effort is normal. No respiratory distress.     Breath sounds: Normal breath sounds. No wheezing, rhonchi or rales.  Abdominal:     Palpations: Abdomen is soft.     Tenderness: There is no abdominal tenderness.  Musculoskeletal:     Cervical back: Neck supple.  Skin:    General: Skin is warm and dry.     Findings: Rash (Erythematous and swollen scalp to the hairline.) present.  Neurological:     Mental Status: She is alert.      UC Treatments / Results  Labs (all labs ordered are listed, but only abnormal results are displayed) Labs Reviewed - No data to display  EKG   Radiology No results found.  Procedures Procedures (including critical care time)  Medications Ordered in UC Medications - No data to display  Initial Impression / Assessment and Plan / UC Course  I have reviewed the triage vital signs and the nursing notes.  Pertinent labs & imaging results that were available during my care of the patient were reviewed by me and considered in my medical decision making (see chart for details).     #Allergic contact dermatitis Patient is an 79 year old presenting with allergic contact dermatitis secondary to hair dye.  She had a similar incident in 2019.  She had good response to steroid therapy.  We will start her on a prednisone pack.  We will consider a taper given age.  Considered IM steroids however risks of associated psychosis and inability to stop medicine in an outpatient setting.  Strict emergency department precautions for worsening swelling or difficulty breathing or throat swelling were discussed with patient.  She verbalizes very good understanding this.  She is to return if no significant improvement in the next 48 hours.  She reports her son drove her and is able to check on her. Final  Clinical Impressions(s) / UC Diagnoses   Final diagnoses:  Allergic dermatitis due to other chemical product     Discharge Instructions     Take the prednisone as directed -I would like  for your first dose to be today you may split this up or take all the pills at once -Please be wary of how you are feeling while taking this medicine  If you are not improving over the next 24 to 48 hours please return.  If you are worsening, have difficulty breathing or swallowing please report immediately to the emergency department.  You may also take the hydroxyzine one half of a tablet as needed up to every 8 hours.  This can help with itching      ED Prescriptions    Medication Sig Dispense Auth. Provider   predniSONE (STERAPRED UNI-PAK 21 TAB) 10 MG (21) TBPK tablet Take 6 tablets (60 mg total) by mouth daily for 1 day, THEN 5 tablets (50 mg total) daily for 1 day, THEN 4 tablets (40 mg total) daily for 1 day, THEN 3 tablets (30 mg total) daily for 1 day, THEN 2 tablets (20 mg total) daily for 1 day, THEN 1 tablet (10 mg total) daily for 1 day. 21 tablet Dorrell Mitcheltree, Veryl Speak, PA-C   hydrOXYzine (ATARAX/VISTARIL) 25 MG tablet Take 0.5 tablets (12.5 mg total) by mouth every 8 (eight) hours as needed. 12 tablet Mickelle Goupil, Veryl Speak, PA-C     PDMP not reviewed this encounter.   Hermelinda Medicus, PA-C 01/28/20 1326

## 2020-01-30 ENCOUNTER — Telehealth (HOSPITAL_COMMUNITY): Payer: Self-pay | Admitting: Physician Assistant

## 2020-01-30 NOTE — Telephone Encounter (Cosign Needed)
Patient was called today to check on progress and response to prednisone therapy.  She reports she has had some improved swelling over the right eye and is able to open her eye mostly fully but still has some swelling around the eye.  She reports she had significant progress from yesterday into today with regard to her swelling.  She has taken her 60 mg dose and a 50 mg dose of prednisone so far without side effects.  She has not yet taken her 40 mg dose today.  Patient reports she is feeling well.  We discussed that I will call and check on her again tomorrow to monitor her progress.  We discussed that if she has no improvement from today into tomorrow that she should come back into the urgent care for further evaluation and possible change to her management.  We discussed that if swelling were to slightly increase that she should also return.  Patient verbalized understanding.

## 2020-02-01 ENCOUNTER — Telehealth (HOSPITAL_COMMUNITY): Payer: Self-pay | Admitting: Physician Assistant

## 2020-02-01 NOTE — Telephone Encounter (Cosign Needed)
Attempted phone contact, home and Mobile. Left message on Mobile that we were checking her progress and that if she had concerns about slowing of progress to return to Urgent Care today. Will attempt call this afternoon.

## 2020-02-01 NOTE — Telephone Encounter (Cosign Needed)
Patient phone.  She reports she has had significant improvement in swelling to the point where it is almost unnoticeable.  She does report a little bit of side effects to the prednisone such that it is keeping her up at night.  She reports taking the 20 mg dose today.  She has 1 dose of 10 mg tomorrow.  Discussed that it is very encouraging that she has had significant resolution of symptoms, however we should watch for any return of swelling and that she should report back to urgent care if she has any increase in swelling at this point.  Discussed that sleep pattern should improve with the lower doses today and tomorrow.  Patient to follow-up with any further concerns.

## 2020-05-01 DIAGNOSIS — E119 Type 2 diabetes mellitus without complications: Secondary | ICD-10-CM | POA: Diagnosis not present

## 2020-05-01 DIAGNOSIS — R739 Hyperglycemia, unspecified: Secondary | ICD-10-CM | POA: Diagnosis not present

## 2020-05-01 DIAGNOSIS — I10 Essential (primary) hypertension: Secondary | ICD-10-CM | POA: Diagnosis not present

## 2020-05-01 DIAGNOSIS — E7800 Pure hypercholesterolemia, unspecified: Secondary | ICD-10-CM | POA: Diagnosis not present

## 2020-05-14 DIAGNOSIS — I10 Essential (primary) hypertension: Secondary | ICD-10-CM | POA: Diagnosis not present

## 2020-05-14 DIAGNOSIS — E119 Type 2 diabetes mellitus without complications: Secondary | ICD-10-CM | POA: Diagnosis not present

## 2020-05-14 DIAGNOSIS — E7800 Pure hypercholesterolemia, unspecified: Secondary | ICD-10-CM | POA: Diagnosis not present

## 2020-05-14 DIAGNOSIS — L723 Sebaceous cyst: Secondary | ICD-10-CM | POA: Diagnosis not present

## 2020-05-14 DIAGNOSIS — Z0000 Encounter for general adult medical examination without abnormal findings: Secondary | ICD-10-CM | POA: Diagnosis not present

## 2020-05-14 DIAGNOSIS — Z01419 Encounter for gynecological examination (general) (routine) without abnormal findings: Secondary | ICD-10-CM | POA: Diagnosis not present

## 2020-05-22 DIAGNOSIS — I10 Essential (primary) hypertension: Secondary | ICD-10-CM | POA: Diagnosis not present

## 2020-05-22 DIAGNOSIS — H409 Unspecified glaucoma: Secondary | ICD-10-CM | POA: Diagnosis not present

## 2020-05-22 DIAGNOSIS — I951 Orthostatic hypotension: Secondary | ICD-10-CM | POA: Diagnosis not present

## 2020-05-22 DIAGNOSIS — Z8249 Family history of ischemic heart disease and other diseases of the circulatory system: Secondary | ICD-10-CM | POA: Diagnosis not present

## 2020-05-22 DIAGNOSIS — E785 Hyperlipidemia, unspecified: Secondary | ICD-10-CM | POA: Diagnosis not present

## 2020-05-22 DIAGNOSIS — Z803 Family history of malignant neoplasm of breast: Secondary | ICD-10-CM | POA: Diagnosis not present

## 2020-05-22 DIAGNOSIS — Z7982 Long term (current) use of aspirin: Secondary | ICD-10-CM | POA: Diagnosis not present

## 2020-05-22 DIAGNOSIS — E669 Obesity, unspecified: Secondary | ICD-10-CM | POA: Diagnosis not present

## 2020-05-22 DIAGNOSIS — Z7722 Contact with and (suspected) exposure to environmental tobacco smoke (acute) (chronic): Secondary | ICD-10-CM | POA: Diagnosis not present

## 2020-05-22 DIAGNOSIS — K08409 Partial loss of teeth, unspecified cause, unspecified class: Secondary | ICD-10-CM | POA: Diagnosis not present

## 2020-06-07 DIAGNOSIS — H2513 Age-related nuclear cataract, bilateral: Secondary | ICD-10-CM | POA: Diagnosis not present

## 2020-06-07 DIAGNOSIS — E119 Type 2 diabetes mellitus without complications: Secondary | ICD-10-CM | POA: Diagnosis not present

## 2020-06-07 DIAGNOSIS — H401111 Primary open-angle glaucoma, right eye, mild stage: Secondary | ICD-10-CM | POA: Diagnosis not present

## 2020-06-07 DIAGNOSIS — H40022 Open angle with borderline findings, high risk, left eye: Secondary | ICD-10-CM | POA: Diagnosis not present

## 2020-06-20 DIAGNOSIS — B351 Tinea unguium: Secondary | ICD-10-CM | POA: Diagnosis not present

## 2020-06-20 DIAGNOSIS — L723 Sebaceous cyst: Secondary | ICD-10-CM | POA: Diagnosis not present

## 2020-06-20 DIAGNOSIS — L603 Nail dystrophy: Secondary | ICD-10-CM | POA: Diagnosis not present

## 2020-06-21 DIAGNOSIS — M47812 Spondylosis without myelopathy or radiculopathy, cervical region: Secondary | ICD-10-CM | POA: Diagnosis not present

## 2020-06-21 DIAGNOSIS — R531 Weakness: Secondary | ICD-10-CM | POA: Diagnosis not present

## 2020-06-21 DIAGNOSIS — R202 Paresthesia of skin: Secondary | ICD-10-CM | POA: Diagnosis not present

## 2020-06-21 DIAGNOSIS — I700 Atherosclerosis of aorta: Secondary | ICD-10-CM | POA: Diagnosis not present

## 2020-06-21 DIAGNOSIS — M5134 Other intervertebral disc degeneration, thoracic region: Secondary | ICD-10-CM | POA: Diagnosis not present

## 2020-06-21 DIAGNOSIS — R29898 Other symptoms and signs involving the musculoskeletal system: Secondary | ICD-10-CM | POA: Diagnosis not present

## 2020-06-27 ENCOUNTER — Encounter: Payer: Self-pay | Admitting: Neurology

## 2020-06-27 ENCOUNTER — Other Ambulatory Visit: Payer: Self-pay

## 2020-06-27 ENCOUNTER — Telehealth: Payer: Self-pay | Admitting: Neurology

## 2020-06-27 ENCOUNTER — Ambulatory Visit: Payer: Medicare HMO | Admitting: Neurology

## 2020-06-27 VITALS — BP 146/88 | HR 112 | Ht 65.0 in | Wt 179.0 lb

## 2020-06-27 DIAGNOSIS — G5601 Carpal tunnel syndrome, right upper limb: Secondary | ICD-10-CM

## 2020-06-27 DIAGNOSIS — M2569 Stiffness of other specified joint, not elsewhere classified: Secondary | ICD-10-CM | POA: Diagnosis not present

## 2020-06-27 DIAGNOSIS — R202 Paresthesia of skin: Secondary | ICD-10-CM | POA: Diagnosis not present

## 2020-06-27 DIAGNOSIS — R200 Anesthesia of skin: Secondary | ICD-10-CM | POA: Diagnosis not present

## 2020-06-27 MED ORDER — TOPIRAMATE 25 MG PO TABS: 25.0000 mg | ORAL_TABLET | Freq: Two times a day (BID) | ORAL | 2 refills | Status: DC

## 2020-06-27 NOTE — Progress Notes (Signed)
[facility] [address] [place]. [place] [address] [phone]       OFFICE CONSULT NOTE  [name] Date of Birth:  Sep 21, 1941 Medical Record Number:  [id number]   Referring MD: Pearson Grippe Reason for Referral: Right-sided stiffness and numbness HPI: [name] is a pleasant 79-year-old African-American lady seen today for initial office consultation visit.  She is accompanied by her husband.  History is obtained from them and review of referral notes and electronic medical records.  She has past medical history of diabetes and cataracts and states that for almost a year now she has noticed some stiffness and discomfort in the right shoulder and upper back.  She describes this as a sensation of tightness and pulling.  This is constant.  This began gradually and she does not remember having lifted something heavy and getting a muscle pull which initiated this.  She is also noticed some tingling in the right hand particularly when she is lying down at night.  This involves most of her fingers.  She denies any activity in which there is excessive rapid repetitive wrist flexion movements.  She has not been evaluated for carpal tunnel.  Patient states she has no range of movement restriction in her shoulder and arms but feels the muscles in the back are tight when she does so.  She has not tried over-the-counter analgesics or muscle relaxants.  She saw her primary care physician who felt that she could have had a stroke and hence is referred to me for evaluation.  She does report a brief episode of numbness in the right jaw last July but this lasted only few minutes and resolved.  She denies any other symptoms or history of of stroke in the form of focal extremity weakness, gait balance problems or vision problems.  There is no history of seizures or migraines or other neurological problems.  She states she did have some x-rays of her shoulder and chest done by primary care  physician but I am unable to find the records in PACS. ROS:   14 system review of systems is positive for stiffness, shoulder pain, tingling, numbness and all other systems negative  PMH:  Past Medical History:  Diagnosis Date   Cataract    Diabetes mellitus without complication (HCC)     Social History:  Social History   Socioeconomic History   Marital status: Married    Spouse name: Not on file   Number of children: Not on file   Years of education: Not on file   Highest education level: Not on file  Occupational History   Not on file  Tobacco Use   Smoking status: Never Smoker   Smokeless tobacco: Never Used  Substance and Sexual Activity   Alcohol use: No   Drug use: No   Sexual activity: Yes  Other Topics Concern   Not on file  Social History Narrative   Not on file   Social Determinants of Health   Financial Resource Strain:    Difficulty of Paying Living Expenses: Not on file  Food Insecurity:    Worried About Running Out of Food in the Last Year: Not on file   Ran Out of Food in the Last Year: Not on file  Transportation Needs:    Lack of Transportation (Medical): Not on file   Lack of Transportation (Non-Medical): Not on file  Physical Activity:    Days of Exercise per Week: Not on file   Minutes  of Exercise per Session: Not on file  Stress:    Feeling of Stress : Not on file  Social Connections:    Frequency of Communication with Friends and Family: Not on file   Frequency of Social Gatherings with Friends and Family: Not on file   Attends Religious Services: Not on file   Active Member of Clubs or Organizations: Not on file   Attends Banker Meetings: Not on file   Marital Status: Not on file  Intimate Partner Violence:    Fear of Current or Ex-Partner: Not on file   Emotionally Abused: Not on file   Physically Abused: Not on file   Sexually Abused: Not on file    Medications:   Current Outpatient  Medications on File Prior to Visit  Medication Sig Dispense Refill   aspirin EC 81 MG tablet Take 81 mg by mouth daily.     dorzolamide-timolol (COSOPT) 22.3-6.8 MG/ML ophthalmic solution      fluticasone (FLONASE) 50 MCG/ACT nasal spray Place 2 sprays into both nostrils daily. 16 g 2   hydrOXYzine (ATARAX/VISTARIL) 25 MG tablet Take 0.5 tablets (12.5 mg total) by mouth every 8 (eight) hours as needed. 12 tablet 0   latanoprost (XALATAN) 0.005 % ophthalmic solution SMARTSIG:1 Drop(s) In Eye(s) Every Evening     lisinopril-hydrochlorothiazide (PRINZIDE,ZESTORETIC) 20-12.5 MG tablet TK 1 T PO QD  3   No current facility-administered medications on file prior to visit.    Allergies:  No Known Allergies  Physical Exam General: well developed, well nourished elderly African-American lady, seated, in no evident distress Head: head normocephalic and atraumatic.   Neck: supple with no carotid or supraclavicular bruits Cardiovascular: regular rate and rhythm, no murmurs Musculoskeletal: no deformity.  Mild stiffness of the right rib cage muscles slight tenderness to touch. Skin:  no rash/petichiae Vascular:  Normal pulses all extremities  Neurologic Exam Mental Status: Awake and fully alert. Oriented to place and time. Recent and remote memory intact. Attention span, concentration and fund of knowledge appropriate. Mood and affect appropriate.  Cranial Nerves: Fundoscopic exam reveals sharp disc margins. Pupils equal, briskly reactive to light. Extraocular movements full without nystagmus. Visual fields full to confrontation. Hearing intact. Facial sensation intact. Face, tongue, palate moves normally and symmetrically.  Motor: Normal bulk and tone. Normal strength in all tested extremity muscles. Sensory.: intact to touch , pinprick , position and vibratory sensation.  Positive Tinel sign over the right wrist. Coordination: Rapid alternating movements normal in all extremities.  Finger-to-nose and heel-to-shin performed accurately bilaterally. Gait and Station: Arises from chair without difficulty. Stance is normal. Gait demonstrates normal stride length and balance . Able to heel, toe and tandem walk with mild difficulty.  Reflexes: 1+ and symmetric. Toes downgoing.      ASSESSMENT: 79 year old lady with chronic right arm and upper back stiffness likely of musculoskeletal etiology as well as right hand paresthesias likely from carpal tunnel syndrome.     PLAN: I had a long discussion with the patient and her husband regarding her symptoms of right-sided shoulder and upper back stiffness as well as right hand paresthesias and answered questions.  I think she has right-sided carpal tunnel syndrome in the right upper back and shoulder stiffness is likely of musculoskeletal etiology.  I recommend she wear right wrist extension splint at all times and check EMG nerve conduction study.  Trial of Topamax 25 mg twice daily to help with paresthesias.  Recommend she try local heat application and upper back  as well as application of local muscle relaxation cream like Bengay.  Check MRI scan of the brain.  Greater than 50% time during this 45-minute consultation was it was spent in counseling and coordination of care about her right sided tightness and hand paresthesias and answering questions she will return for follow-up in 2 months or call earlier if necessary. Delia Heady, MD  [facility] [address] [place], [place] [address]  Phone [phone] Fax [phone] Note: This document was prepared with digital dictation and possible smart phrase technology. Any transcriptional errors that result from this process are unintentional.

## 2020-06-27 NOTE — Patient Instructions (Addendum)
I had a long discussion with the patient and her husband regarding her symptoms of right-sided shoulder and upper back stiffness as well as right hand paresthesias and answered questions.  I think she has right-sided carpal tunnel syndrome in the right upper back and shoulder stiffness is likely of musculoskeletal etiology.  I recommend she wear right wrist extension splint at all times and check EMG nerve conduction study.  Trial of Topamax 25 mg twice daily to help with paresthesias.  Recommend she try local heat application and upper back as well as application of local muscle relaxation cream like Bengay.  Check MRI scan of the brain.  She will return for follow-up in 2 months or call earlier if necessary.    Carpal Tunnel Syndrome  Carpal tunnel syndrome is a condition that causes pain in your hand and arm. The carpal tunnel is a narrow area that is on the palm side of your wrist. Repeated wrist motion or certain diseases may cause swelling in the tunnel. This swelling can pinch the main nerve in the wrist (median nerve). What are the causes? This condition may be caused by:  Repeated wrist motions.  Wrist injuries.  Arthritis.  A sac of fluid (cyst) or abnormal growth (tumor) in the carpal tunnel.  Fluid buildup during pregnancy. Sometimes the cause is not known. What increases the risk? The following factors may make you more likely to develop this condition:  Having a job in which you move your wrist in the same way many times. This includes jobs like being a Midwife or a Conservation officer, nature.  Being a woman.  Having other health conditions, such as: ? Diabetes. ? Obesity. ? A thyroid gland that is not active enough (hypothyroidism). ? Kidney failure. What are the signs or symptoms? Symptoms of this condition include:  A tingling feeling in your fingers.  Tingling or a loss of feeling (numbness) in your hand.  Pain in your entire arm. This pain may get worse when you bend your wrist  and elbow for a long time.  Pain in your wrist that goes up your arm to your shoulder.  Pain that goes down into your palm or fingers.  A weak feeling in your hands. You may find it hard to grab and hold items. You may feel worse at night. How is this diagnosed? This condition is diagnosed with a medical history and physical exam. You may also have tests, such as:  Electromyogram (EMG). This test checks the signals that the nerves send to the muscles.  Nerve conduction study. This test checks how well signals pass through your nerves.  Imaging tests, such as X-rays, ultrasound, and MRI. These tests check for what might be the cause of your condition. How is this treated? This condition may be treated with:  Lifestyle changes. You will be asked to stop or change the activity that caused your problem.  Doing exercise and activities that make bones and muscles stronger (physical therapy).  Learning how to use your hand again (occupational therapy).  Medicines for pain and swelling (inflammation). You may have injections in your wrist.  A wrist splint.  Surgery. Follow these instructions at home: If you have a splint:  Wear the splint as told by your doctor. Remove it only as told by your doctor.  Loosen the splint if your fingers: ? Tingle. ? Lose feeling (become numb). ? Turn cold and blue.  Keep the splint clean.  If the splint is not waterproof: ? Do not  let it get wet. ? Cover it with a watertight covering when you take a bath or a shower. Managing pain, stiffness, and swelling   If told, put ice on the painful area: ? If you have a removable splint, remove it as told by your doctor. ? Put ice in a plastic bag. ? Place a towel between your skin and the bag. ? Leave the ice on for 20 minutes, 2-3 times per day. General instructions  Take over-the-counter and prescription medicines only as told by your doctor.  Rest your wrist from any activity that may cause  pain. If needed, talk with your boss at work about changes that can help your wrist heal.  Do any exercises as told by your doctor, physical therapist, or occupational therapist.  Keep all follow-up visits as told by your doctor. This is important. Contact a doctor if:  You have new symptoms.  Medicine does not help your pain.  Your symptoms get worse. Get help right away if:  You have very bad numbness or tingling in your wrist or hand. Summary  Carpal tunnel syndrome is a condition that causes pain in your hand and arm.  It is often caused by repeated wrist motions.  Lifestyle changes and medicines are used to treat this problem. Surgery may help in very bad cases.  Follow your doctor's instructions about wearing a splint, resting your wrist, keeping follow-up visits, and calling for help. This information is not intended to replace advice given to you by your health care provider. Make sure you discuss any questions you have with your health care provider. Document Revised: 01/29/2018 Document Reviewed: 01/29/2018 Elsevier Patient Education  2020 [employer].

## 2020-06-27 NOTE — Telephone Encounter (Signed)
[employer] medicare order sent to [facility]. They will obtain the auth and reach out to the patient to schedule.  °

## 2020-07-03 ENCOUNTER — Emergency Department (HOSPITAL_COMMUNITY): Payer: Medicare HMO

## 2020-07-03 ENCOUNTER — Inpatient Hospital Stay (HOSPITAL_COMMUNITY)
Admission: EM | Admit: 2020-07-03 | Discharge: 2020-07-11 | DRG: 023 | Disposition: A | Discharge status: Rehabilitation Facility | Payer: Medicare HMO | Attending: Neurology | Admitting: Neurology

## 2020-07-03 ENCOUNTER — Inpatient Hospital Stay (HOSPITAL_COMMUNITY): Payer: Medicare HMO

## 2020-07-03 DIAGNOSIS — G8321 Monoplegia of upper limb affecting right dominant side: Secondary | ICD-10-CM | POA: Diagnosis present

## 2020-07-03 DIAGNOSIS — Z6829 Body mass index (BMI) 29.0-29.9, adult: Secondary | ICD-10-CM | POA: Diagnosis not present

## 2020-07-03 DIAGNOSIS — G936 Cerebral edema: Secondary | ICD-10-CM | POA: Diagnosis not present

## 2020-07-03 DIAGNOSIS — E162 Hypoglycemia, unspecified: Secondary | ICD-10-CM | POA: Diagnosis not present

## 2020-07-03 DIAGNOSIS — R29818 Other symptoms and signs involving the nervous system: Secondary | ICD-10-CM | POA: Diagnosis not present

## 2020-07-03 DIAGNOSIS — E785 Hyperlipidemia, unspecified: Secondary | ICD-10-CM | POA: Diagnosis present

## 2020-07-03 DIAGNOSIS — I69391 Dysphagia following cerebral infarction: Secondary | ICD-10-CM | POA: Diagnosis not present

## 2020-07-03 DIAGNOSIS — R7303 Prediabetes: Secondary | ICD-10-CM | POA: Diagnosis present

## 2020-07-03 DIAGNOSIS — Z79899 Other long term (current) drug therapy: Secondary | ICD-10-CM

## 2020-07-03 DIAGNOSIS — Z978 Presence of other specified devices: Secondary | ICD-10-CM

## 2020-07-03 DIAGNOSIS — J9600 Acute respiratory failure, unspecified whether with hypoxia or hypercapnia: Secondary | ICD-10-CM | POA: Diagnosis not present

## 2020-07-03 DIAGNOSIS — Z7982 Long term (current) use of aspirin: Secondary | ICD-10-CM

## 2020-07-03 DIAGNOSIS — I63312 Cerebral infarction due to thrombosis of left middle cerebral artery: Principal | ICD-10-CM | POA: Diagnosis present

## 2020-07-03 DIAGNOSIS — R4781 Slurred speech: Secondary | ICD-10-CM | POA: Diagnosis not present

## 2020-07-03 DIAGNOSIS — I358 Other nonrheumatic aortic valve disorders: Secondary | ICD-10-CM | POA: Diagnosis present

## 2020-07-03 DIAGNOSIS — Z20822 Contact with and (suspected) exposure to covid-19: Secondary | ICD-10-CM | POA: Diagnosis not present

## 2020-07-03 DIAGNOSIS — I6389 Other cerebral infarction: Secondary | ICD-10-CM | POA: Diagnosis not present

## 2020-07-03 DIAGNOSIS — I6602 Occlusion and stenosis of left middle cerebral artery: Secondary | ICD-10-CM | POA: Diagnosis not present

## 2020-07-03 DIAGNOSIS — R1310 Dysphagia, unspecified: Secondary | ICD-10-CM | POA: Diagnosis present

## 2020-07-03 DIAGNOSIS — I672 Cerebral atherosclerosis: Secondary | ICD-10-CM | POA: Diagnosis present

## 2020-07-03 DIAGNOSIS — I63232 Cerebral infarction due to unspecified occlusion or stenosis of left carotid arteries: Secondary | ICD-10-CM | POA: Diagnosis present

## 2020-07-03 DIAGNOSIS — R29810 Facial weakness: Secondary | ICD-10-CM | POA: Diagnosis present

## 2020-07-03 DIAGNOSIS — I952 Hypotension due to drugs: Secondary | ICD-10-CM | POA: Diagnosis not present

## 2020-07-03 DIAGNOSIS — Z888 Allergy status to other drugs, medicaments and biological substances status: Secondary | ICD-10-CM

## 2020-07-03 DIAGNOSIS — R4701 Aphasia: Secondary | ICD-10-CM | POA: Diagnosis not present

## 2020-07-03 DIAGNOSIS — I609 Nontraumatic subarachnoid hemorrhage, unspecified: Secondary | ICD-10-CM | POA: Diagnosis not present

## 2020-07-03 DIAGNOSIS — I639 Cerebral infarction, unspecified: Secondary | ICD-10-CM | POA: Diagnosis not present

## 2020-07-03 DIAGNOSIS — E46 Unspecified protein-calorie malnutrition: Secondary | ICD-10-CM | POA: Diagnosis not present

## 2020-07-03 DIAGNOSIS — Z4659 Encounter for fitting and adjustment of other gastrointestinal appliance and device: Secondary | ICD-10-CM

## 2020-07-03 DIAGNOSIS — J9811 Atelectasis: Secondary | ICD-10-CM | POA: Diagnosis not present

## 2020-07-03 DIAGNOSIS — E119 Type 2 diabetes mellitus without complications: Secondary | ICD-10-CM | POA: Diagnosis present

## 2020-07-03 DIAGNOSIS — R29702 NIHSS score 2: Secondary | ICD-10-CM | POA: Diagnosis present

## 2020-07-03 DIAGNOSIS — I10 Essential (primary) hypertension: Secondary | ICD-10-CM | POA: Diagnosis not present

## 2020-07-03 DIAGNOSIS — I63512 Cerebral infarction due to unspecified occlusion or stenosis of left middle cerebral artery: Secondary | ICD-10-CM | POA: Diagnosis not present

## 2020-07-03 DIAGNOSIS — I6522 Occlusion and stenosis of left carotid artery: Secondary | ICD-10-CM | POA: Diagnosis not present

## 2020-07-03 DIAGNOSIS — I739 Peripheral vascular disease, unspecified: Secondary | ICD-10-CM | POA: Diagnosis not present

## 2020-07-03 DIAGNOSIS — E161 Other hypoglycemia: Secondary | ICD-10-CM | POA: Diagnosis not present

## 2020-07-03 DIAGNOSIS — I6521 Occlusion and stenosis of right carotid artery: Secondary | ICD-10-CM | POA: Diagnosis not present

## 2020-07-03 DIAGNOSIS — R200 Anesthesia of skin: Secondary | ICD-10-CM | POA: Diagnosis present

## 2020-07-03 DIAGNOSIS — H409 Unspecified glaucoma: Secondary | ICD-10-CM | POA: Diagnosis present

## 2020-07-03 DIAGNOSIS — Z9889 Other specified postprocedural states: Secondary | ICD-10-CM | POA: Diagnosis not present

## 2020-07-03 DIAGNOSIS — R1312 Dysphagia, oropharyngeal phase: Secondary | ICD-10-CM | POA: Diagnosis not present

## 2020-07-03 DIAGNOSIS — Z4682 Encounter for fitting and adjustment of non-vascular catheter: Secondary | ICD-10-CM | POA: Diagnosis not present

## 2020-07-03 DIAGNOSIS — E118 Type 2 diabetes mellitus with unspecified complications: Secondary | ICD-10-CM | POA: Diagnosis not present

## 2020-07-03 DIAGNOSIS — D62 Acute posthemorrhagic anemia: Secondary | ICD-10-CM | POA: Diagnosis not present

## 2020-07-03 DIAGNOSIS — Z23 Encounter for immunization: Secondary | ICD-10-CM | POA: Diagnosis present

## 2020-07-03 DIAGNOSIS — E669 Obesity, unspecified: Secondary | ICD-10-CM | POA: Diagnosis present

## 2020-07-03 DIAGNOSIS — G8191 Hemiplegia, unspecified affecting right dominant side: Secondary | ICD-10-CM | POA: Diagnosis present

## 2020-07-03 DIAGNOSIS — R482 Apraxia: Secondary | ICD-10-CM | POA: Diagnosis present

## 2020-07-03 DIAGNOSIS — E876 Hypokalemia: Secondary | ICD-10-CM | POA: Diagnosis not present

## 2020-07-03 DIAGNOSIS — R7401 Elevation of levels of liver transaminase levels: Secondary | ICD-10-CM | POA: Diagnosis not present

## 2020-07-03 DIAGNOSIS — E7800 Pure hypercholesterolemia, unspecified: Secondary | ICD-10-CM | POA: Diagnosis not present

## 2020-07-03 DIAGNOSIS — K5901 Slow transit constipation: Secondary | ICD-10-CM | POA: Diagnosis not present

## 2020-07-03 DIAGNOSIS — Z8679 Personal history of other diseases of the circulatory system: Secondary | ICD-10-CM | POA: Diagnosis not present

## 2020-07-03 DIAGNOSIS — I6523 Occlusion and stenosis of bilateral carotid arteries: Secondary | ICD-10-CM | POA: Diagnosis not present

## 2020-07-03 DIAGNOSIS — R7309 Other abnormal glucose: Secondary | ICD-10-CM | POA: Diagnosis not present

## 2020-07-03 DIAGNOSIS — J988 Other specified respiratory disorders: Secondary | ICD-10-CM | POA: Diagnosis not present

## 2020-07-03 LAB — RESPIRATORY PANEL BY RT PCR (FLU A&B, COVID)
Influenza A by PCR: NEGATIVE
Influenza B by PCR: NEGATIVE
SARS Coronavirus 2 by RT PCR: NEGATIVE

## 2020-07-03 LAB — DIFFERENTIAL
Abs Immature Granulocytes: 0.01 10*3/uL (ref 0.00–0.07)
Basophils Absolute: 0 10*3/uL (ref 0.0–0.1)
Basophils Relative: 1 %
Eosinophils Absolute: 0.2 10*3/uL (ref 0.0–0.5)
Eosinophils Relative: 3 %
Immature Granulocytes: 0 %
Lymphocytes Relative: 40 %
Lymphs Abs: 2.5 10*3/uL (ref 0.7–4.0)
Monocytes Absolute: 0.7 10*3/uL (ref 0.1–1.0)
Monocytes Relative: 12 %
Neutro Abs: 2.8 10*3/uL (ref 1.7–7.7)
Neutrophils Relative %: 44 %

## 2020-07-03 LAB — I-STAT CHEM 8, ED
BUN: 15 mg/dL (ref 8–23)
Calcium, Ion: 1.12 mmol/L — ABNORMAL LOW (ref 1.15–1.40)
Chloride: 103 mmol/L (ref 98–111)
Creatinine, Ser: 1 mg/dL (ref 0.44–1.00)
Glucose, Bld: 116 mg/dL — ABNORMAL HIGH (ref 70–99)
HCT: 41 % (ref 36.0–46.0)
Hemoglobin: 13.9 g/dL (ref 12.0–15.0)
Potassium: 3.3 mmol/L — ABNORMAL LOW (ref 3.5–5.1)
Sodium: 136 mmol/L (ref 135–145)
TCO2: 22 mmol/L (ref 22–32)

## 2020-07-03 LAB — COMPREHENSIVE METABOLIC PANEL
ALT: 18 U/L (ref 0–44)
AST: 22 U/L (ref 15–41)
Albumin: 3.8 g/dL (ref 3.5–5.0)
Alkaline Phosphatase: 71 U/L (ref 38–126)
Anion gap: 14 (ref 5–15)
BUN: 15 mg/dL (ref 8–23)
CO2: 20 mmol/L — ABNORMAL LOW (ref 22–32)
Calcium: 9.4 mg/dL (ref 8.9–10.3)
Chloride: 101 mmol/L (ref 98–111)
Creatinine, Ser: 1.02 mg/dL — ABNORMAL HIGH (ref 0.44–1.00)
GFR calc Af Amer: >60 mL/min (ref >60)
GFR calc non Af Amer: 52 mL/min — ABNORMAL LOW (ref >60)
Glucose, Bld: 121 mg/dL — ABNORMAL HIGH (ref 70–99)
Potassium: 3.4 mmol/L — ABNORMAL LOW (ref 3.5–5.1)
Sodium: 135 mmol/L (ref 135–145)
Total Bilirubin: 0.7 mg/dL (ref 0.3–1.2)
Total Protein: 7.5 g/dL (ref 6.5–8.1)

## 2020-07-03 LAB — CBC
HCT: 39.6 % (ref 36.0–46.0)
Hemoglobin: 12.9 g/dL (ref 12.0–15.0)
MCH: 28 pg (ref 26.0–34.0)
MCHC: 32.6 g/dL (ref 30.0–36.0)
MCV: 86.1 fL (ref 80.0–100.0)
Platelets: 299 10*3/uL (ref 150–400)
RBC: 4.6 MIL/uL (ref 3.87–5.11)
RDW: 13.5 % (ref 11.5–15.5)
WBC: 6.2 10*3/uL (ref 4.0–10.5)
nRBC: 0 % (ref 0.0–0.2)

## 2020-07-03 LAB — CBG MONITORING, ED: Glucose-Capillary: 125 mg/dL — ABNORMAL HIGH (ref 70–99)

## 2020-07-03 LAB — PROTIME-INR
INR: 1.1 (ref 0.8–1.2)
Prothrombin Time: 13.4 seconds (ref 11.4–15.2)

## 2020-07-03 LAB — APTT: aPTT: 32 seconds (ref 24–36)

## 2020-07-03 IMAGING — CT CT HEAD CODE STROKE
4 series · 16 of 47 positions shown, 18 images · non-contrast
Comparison: Head CT [DATE]

CLINICAL DATA: Code stroke. Neuro deficit, acute, stroke suspected.
Additional history provided: Last known well [FK], right-sided
facial droop; slurred speech.

EXAM:
CT HEAD WITHOUT CONTRAST
TECHNIQUE: Contiguous axial images were obtained from the base of the skull
through the vertex without intravenous contrast.

[Series 2: head wo · axial · 0.44mm/px · z∈[+1190,+1310]mm · 7 of 34 slices shown, 9 images]
[im 5/34  brain]
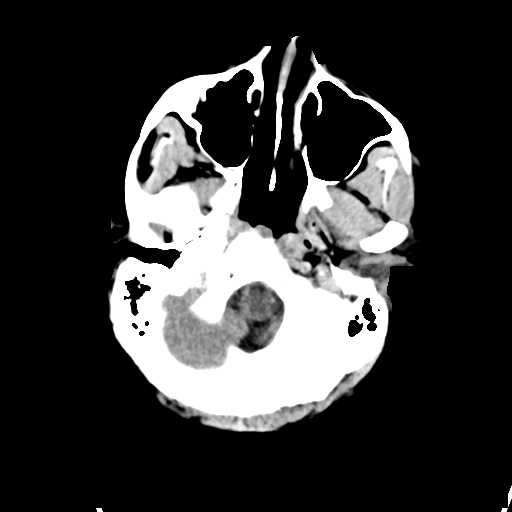
[im 5/34  bone]
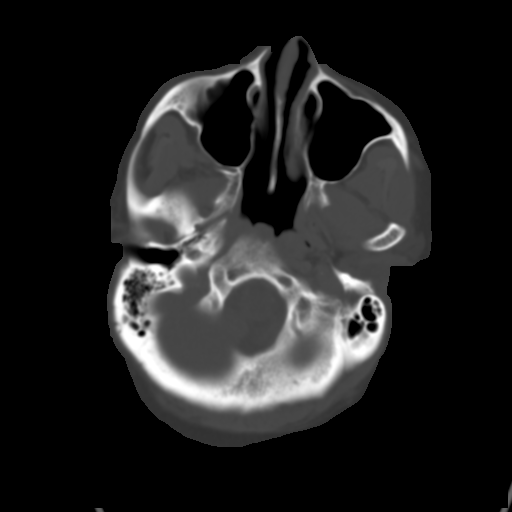
[im 9/34  brain]
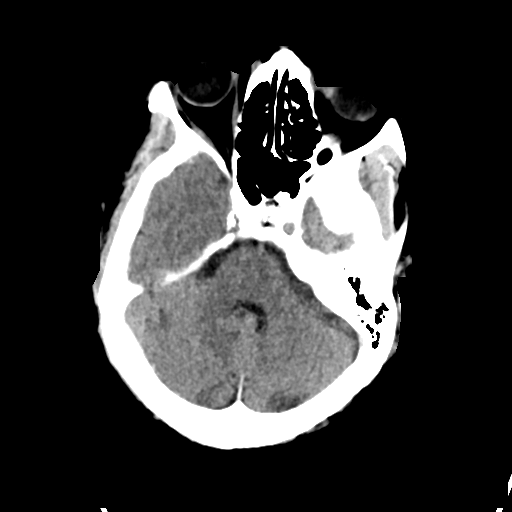
[im 13/34  brain]
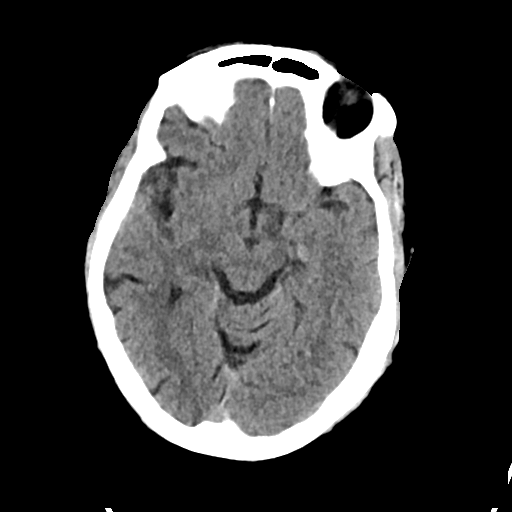
[im 17/34  brain]
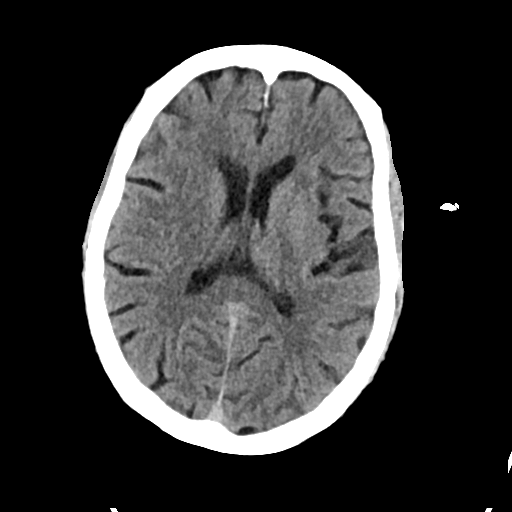
[im 21/34  brain]
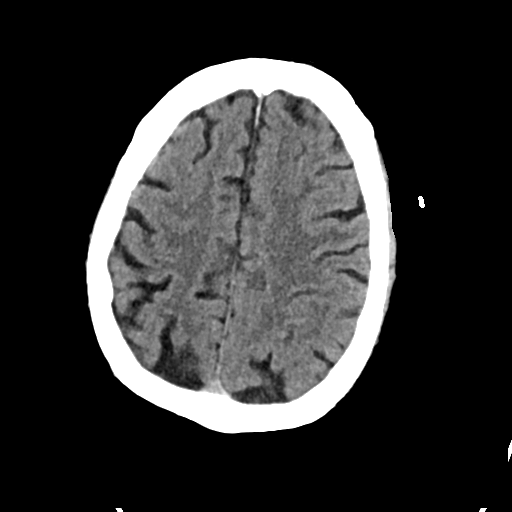
[im 21/34  bone]
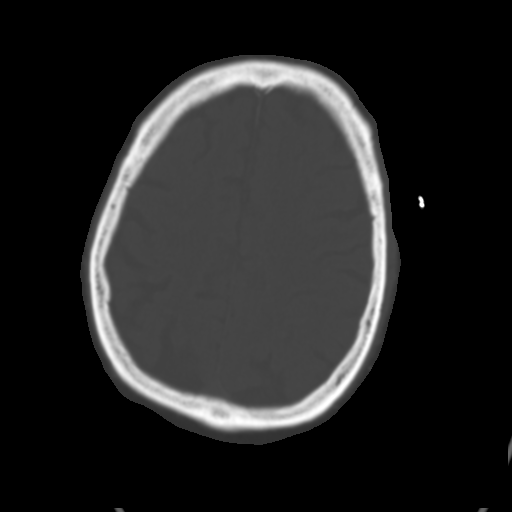
[im 25/34  brain]
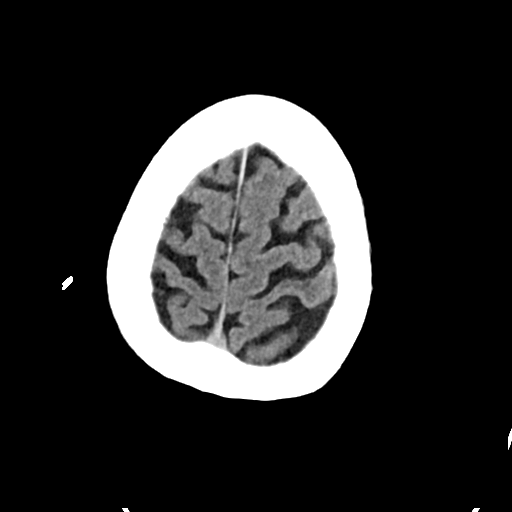
[im 29/34  brain]
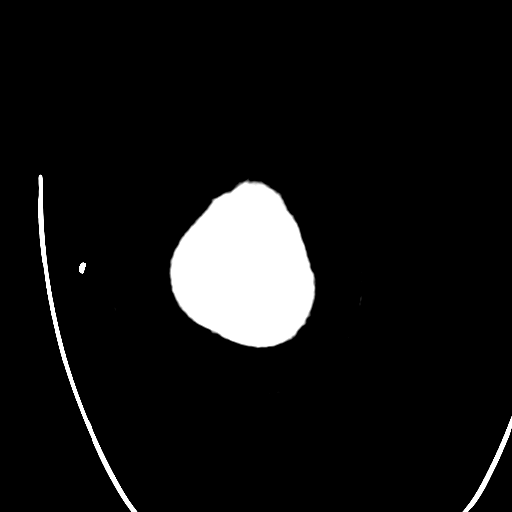

[Series 3: head bone · axial · 0.44mm/px · z∈[+1186,+1218]mm · 3 of 84 slices shown]
[im 9/84  bone]
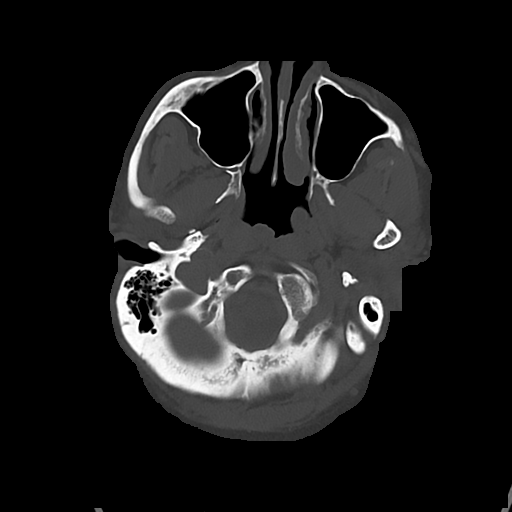
[im 17/84  bone]
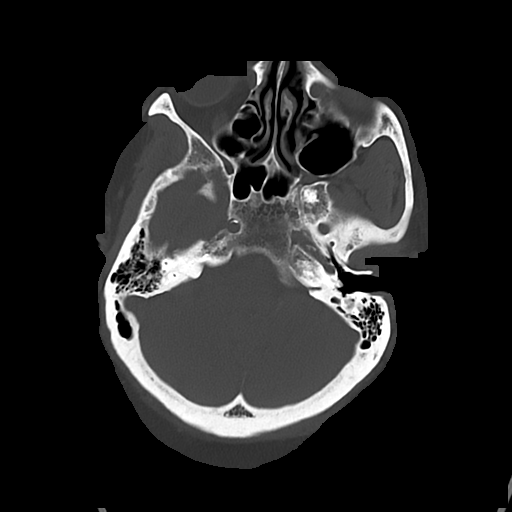
[im 25/84  bone]
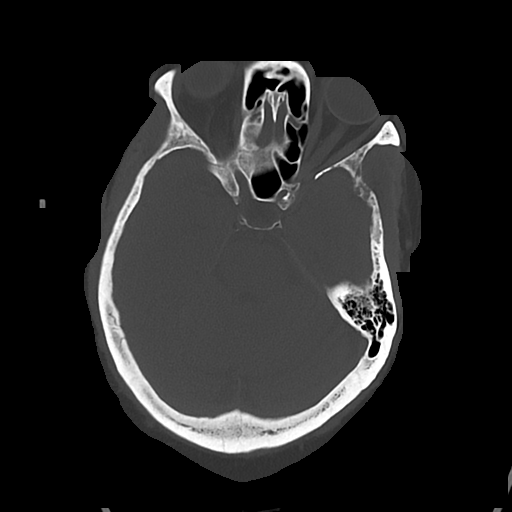

[Series 4: cor soft · coronal · 0.31mm/px · 3 of 73 slices shown]
[im 25/73  brain]
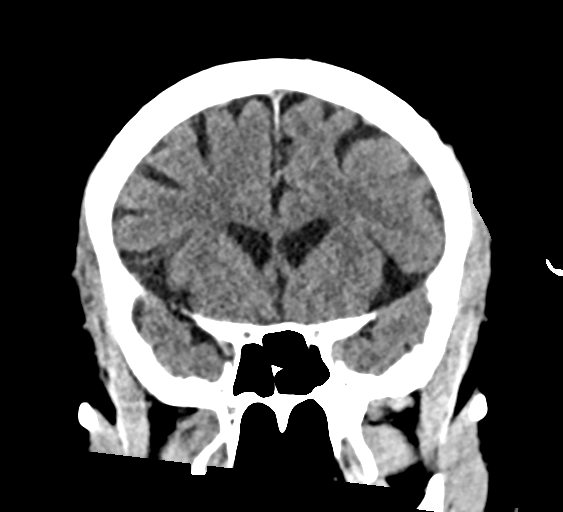
[im 33/73  brain]
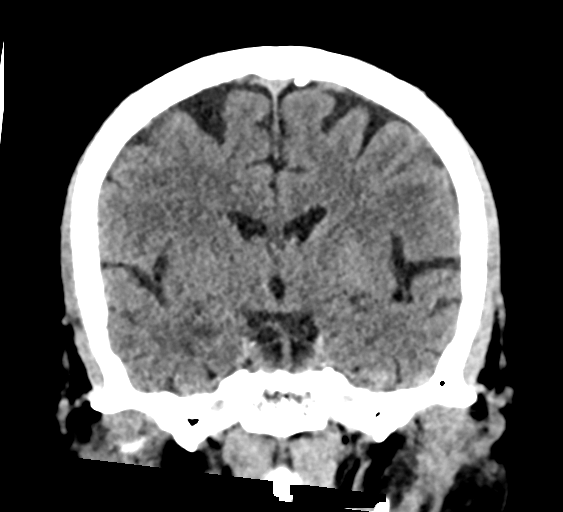
[im 41/73  brain]
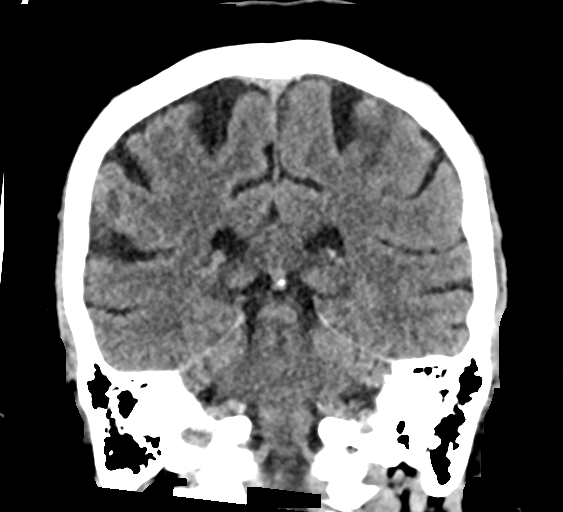

[Series 5: sag soft · sagittal · 0.31mm/px · 3 of 62 slices shown]
[im 22/62  brain]
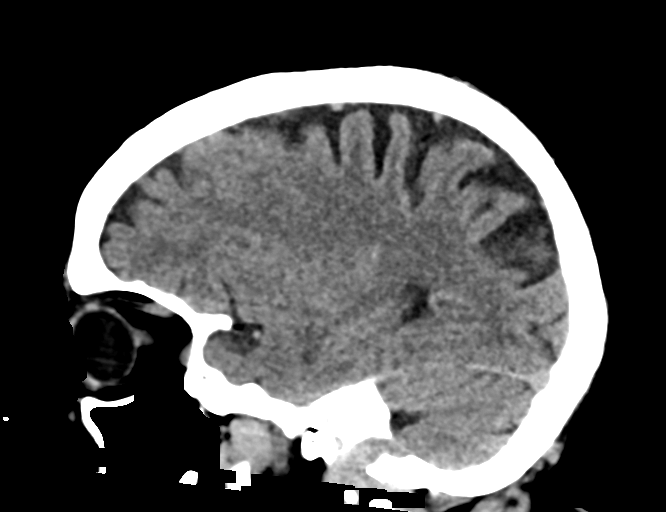
[im 31/62  brain]
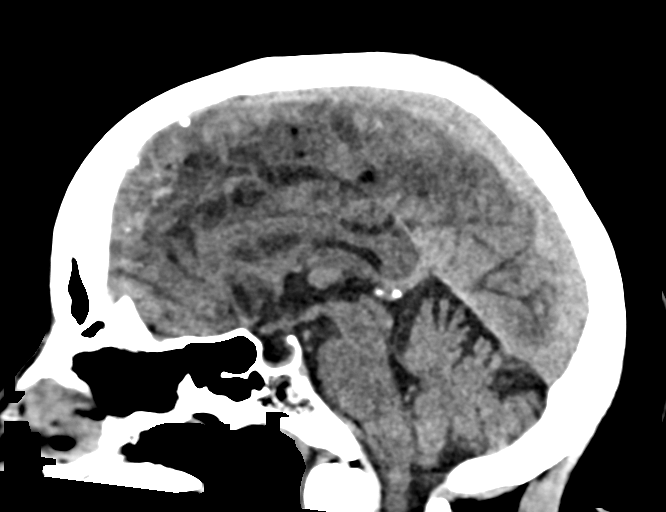
[im 40/62  brain]
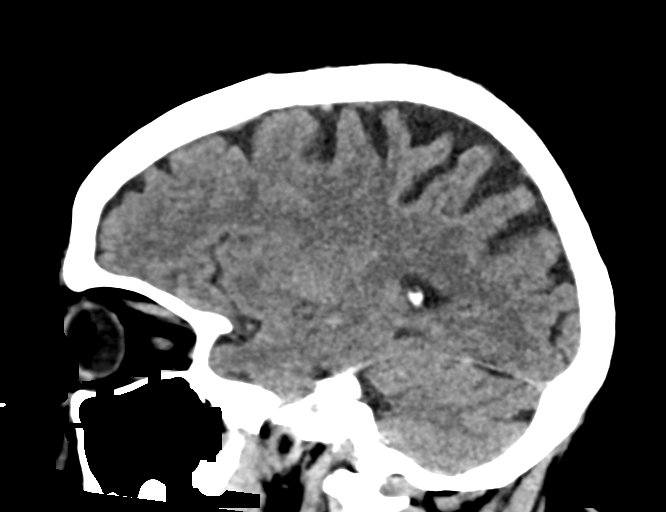

[16 of 47 positions shown; findings below may reference images not displayed]

FINDINGS: Brain:

Mild generalized cerebral atrophy, progressed as compared to the
head CT of [DATE].

There is no acute intracranial hemorrhage.

No demarcated cortical infarct is identified.

No extra-axial fluid collection.

No evidence of intracranial mass.

No midline shift.

Vascular: No hyperdense vessel.

Skull: Normal. Negative for fracture or focal lesion.

Sinuses/Orbits: Visualized orbits show no acute finding. No
significant paranasal sinus disease or mastoid effusion at the
imaged levels.

ASPECTS (Alberta Stroke Program Early CT Score)

- Ganglionic level infarction (caudate, lentiform nuclei, internal
capsule, insula, M1-M3 cortex): 7

- Supraganglionic infarction (M4-M6 cortex): 3

Total score (0-10 with 10 being normal): 10

These results were called by telephone at the time of interpretation
on [DATE] at [DATE] to provider Dr. RONALD JOSUE of neurology, who
verbally acknowledged these results.
IMPRESSION: No evidence of acute intracranial abnormality.

Mild generalized cerebral atrophy, progressed as compared to the
head CT of [DATE].

## 2020-07-03 IMAGING — MR MR HEAD W/O CM
6 of 10 series · 29 of 48 positions shown · non-contrast
Comparison: CT angiogram head/neck and non-contrast CT head
performed earlier the same day [DATE].

CLINICAL DATA: Neuro deficit, acute, stroke suspected. Additional
history provided: Slurred speech, right-sided weakness, right facial
droop.

EXAM:
MRI HEAD WITHOUT CONTRAST
TECHNIQUE: Multiplanar, multiecho pulse sequences of the brain and surrounding
structures were obtained without intravenous contrast.

[Series 2: DWI · axial · 3.0mm · 0.94mm/px · z∈[-75,+72]mm · 9 of 100 slices shown (1 of 2)]
[im 1/100]
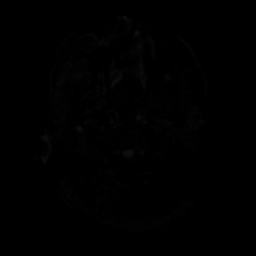
[im 13/100]
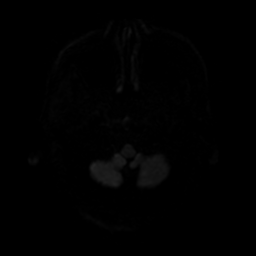
[im 25/100]
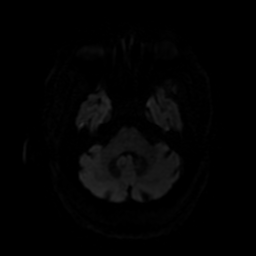
[im 38/100]
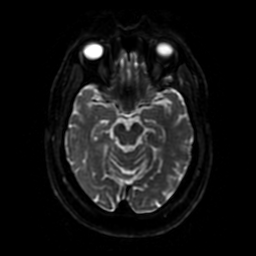
[im 50/100]
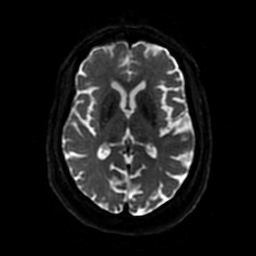
[im 62/100]
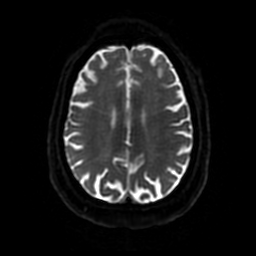
[im 75/100]
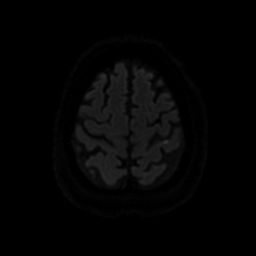
[im 87/100]
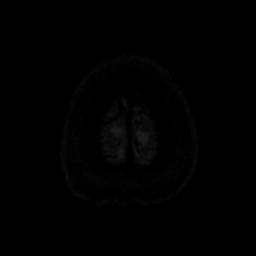
[im 100/100]
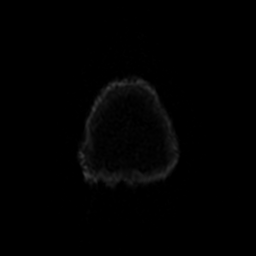

[Series 3: DWI · coronal · 4.0mm · 0.94mm/px · 7 of 78 slices shown (2 of 2)]
[im 1/78]
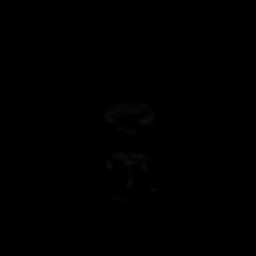
[im 13/78]
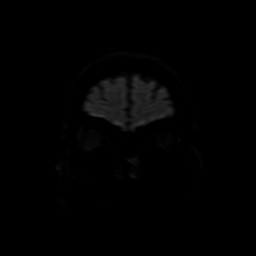
[im 26/78]
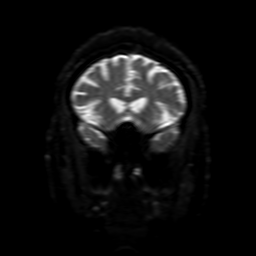
[im 39/78]
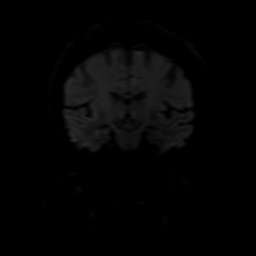
[im 52/78]
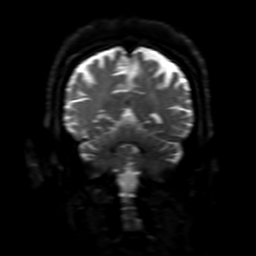
[im 65/78]
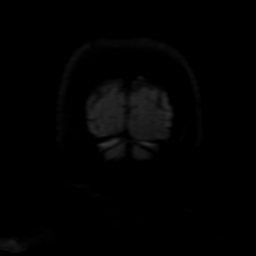
[im 78/78]
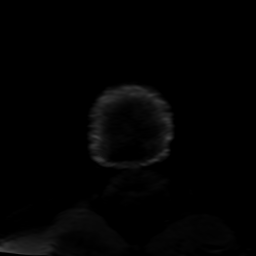

[Series 4: FLAIR · axial · 3.0mm · 0.45mm/px · z∈[-80,+70]mm · 2 of 26 slices shown (1 of 2)]
[im 1/26]
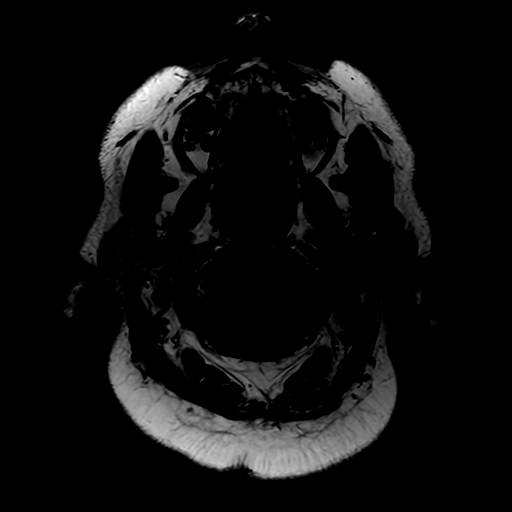
[im 26/26]
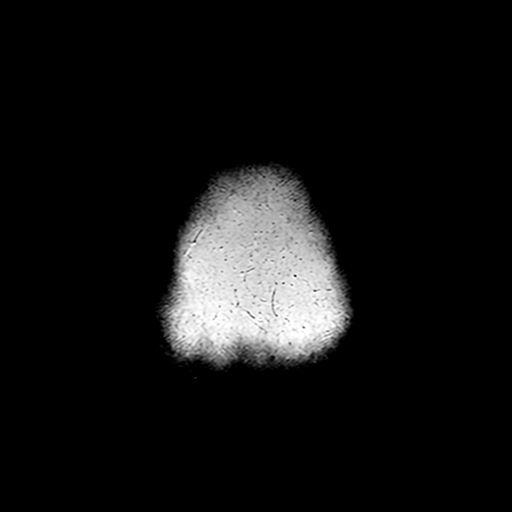

[Series 5: FLAIR · sagittal · 5.0mm · 0.23mm/px · 2 of 25 slices shown (2 of 2)]
[im 1/25]
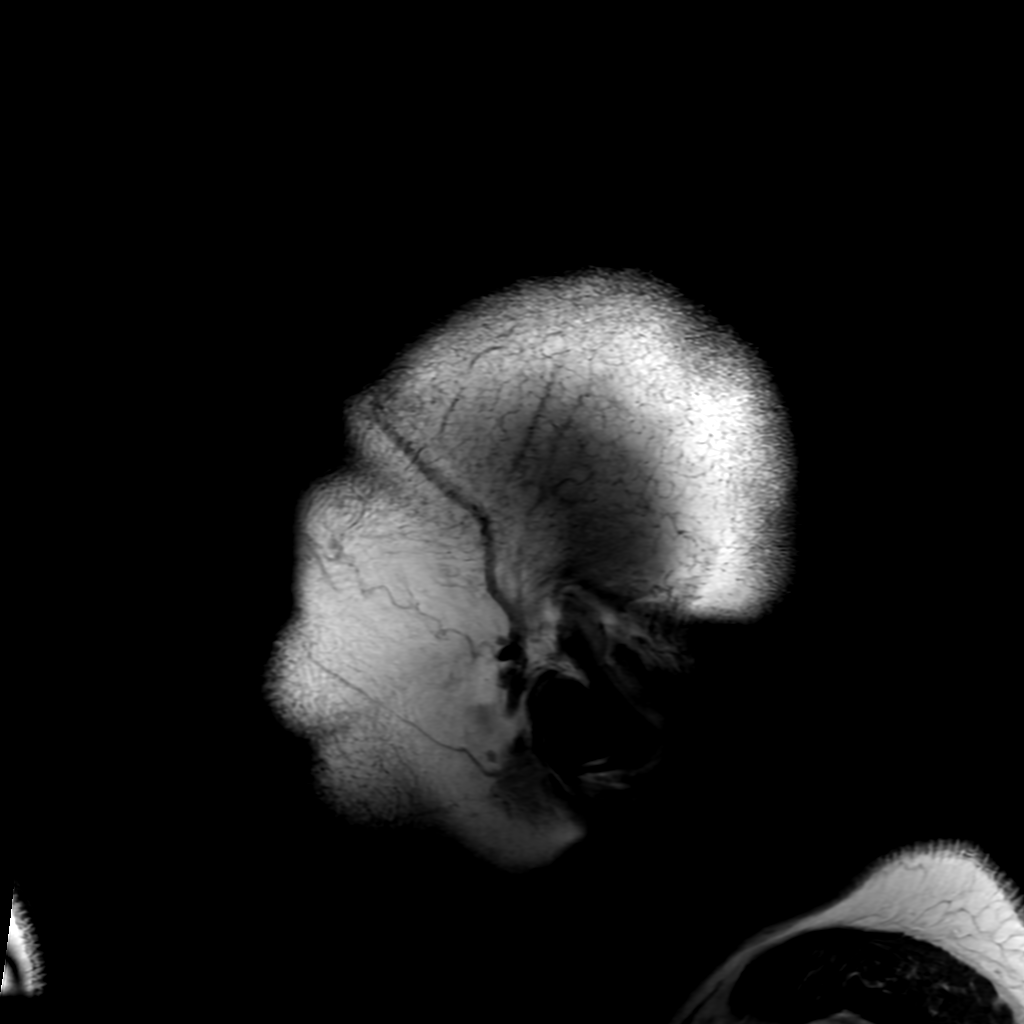
[im 25/25]
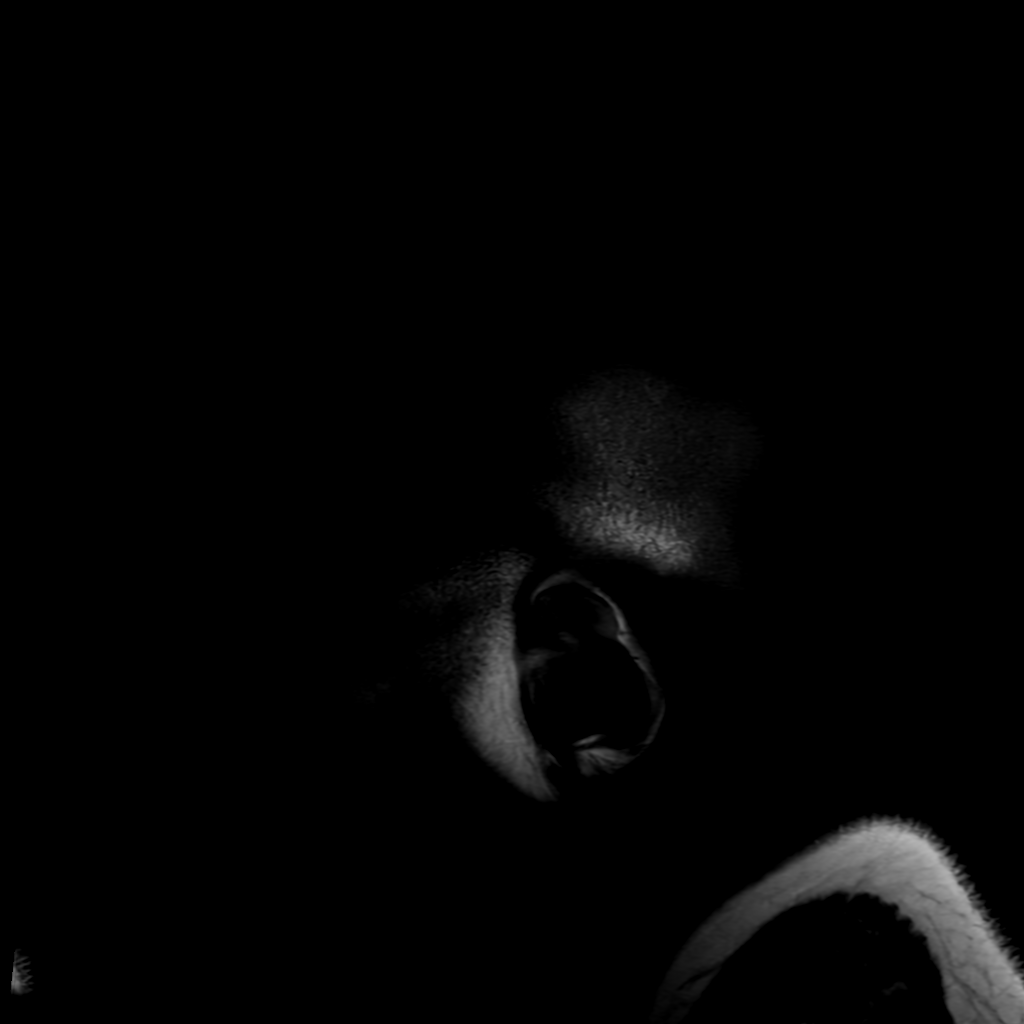

[Series 250: ADC · axial · 3.0mm · 0.94mm/px · z∈[-75,+72]mm · 5 of 50 slices shown (1 of 2)]
[im 1/50]
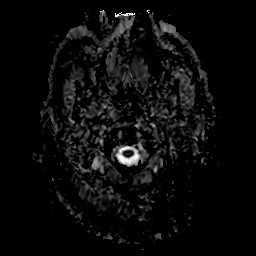
[im 13/50]
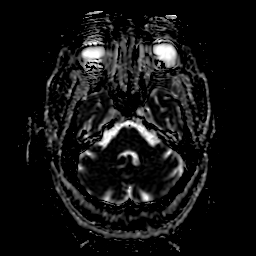
[im 25/50]
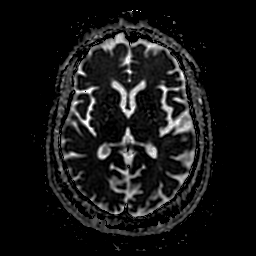
[im 37/50]
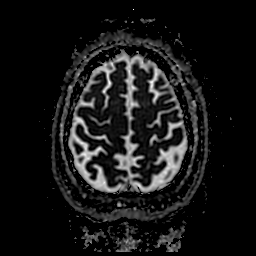
[im 50/50]
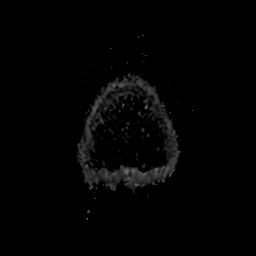

[Series 350: ADC · coronal · 4.0mm · 0.94mm/px · 4 of 39 slices shown (2 of 2)]
[im 1/39]
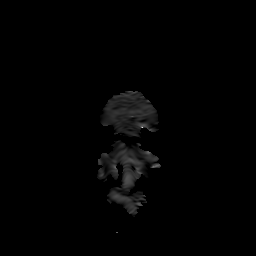
[im 13/39]
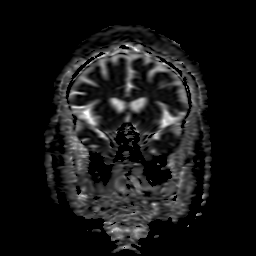
[im 26/39]
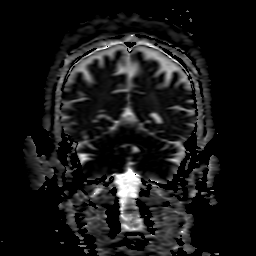
[im 39/39]
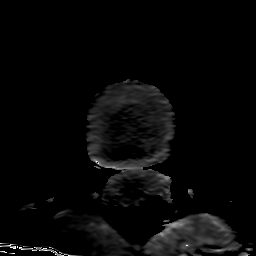

[29 of 48 positions shown; findings below may reference images not displayed]

FINDINGS: Brain:

Mild generalized cerebral atrophy.

There are multifocal small foci of restricted diffusion consistent
with acute infarcts within the left frontal and parietal lobes,
within the left MCA vascular territory. The largest infarct within
the left parietal lobe postcentral gyrus measures 2.1 cm (series 3,
image 18). The remaining acute infarcts are punctate or less than 5
mm in size. Corresponding T2/FLAIR hyperintensity at these sites.

No evidence of intracranial mass.

No chronic intracranial blood products.

No extra-axial fluid collection.

No midline shift.

Vascular: Signal abnormality within the visualized distal cervical
left ICA and pre cavernous left ICA consistent with occlusion
demonstrated on CTA performed earlier the same day. Additionally, a
proximal left M2 MCA branch occlusion was identified on this prior
examination.

Skull and upper cervical spine: No focal marrow lesion.

Sinuses/Orbits: Visualized orbits show no acute finding. Mild
ethmoid sinus mucosal thickening. No significant mastoid effusion.
IMPRESSION: Multiple small acute infarcts within the left frontal and parietal
lobes within the left MCA vascular territory. The largest infarct
within the left parietal lobe postcentral gyrus measures 2.1 cm. The
remaining infarcts are punctate or less than 5 mm.

Signal abnormality within the visualized distal cervical and pre
cavernous left ICA consistent with vessel occlusion demonstrated at
these sites on the prior CTA.

A proximal left M2 MCA branch occlusion was better appreciated on
this prior study.

## 2020-07-03 IMAGING — CT CT ANGIO HEAD-NECK
2 of 12 series · 5 of 35 positions shown · IV contrast (APPLIED)
Comparison: Noncontrast head CT performed earlier the same day.

CLINICAL DATA: Neuro deficit, acute, stroke suspected. Facial
droop, slurred speech.

EXAM:
CT ANGIOGRAPHY HEAD AND NECK
TECHNIQUE: Multidetector CT imaging of the head and neck was performed using
the standard protocol during bolus administration of intravenous
contrast. Multiplanar CT image reconstructions and MIPs were
obtained to evaluate the vascular anatomy. Carotid stenosis
measurements (when applicable) are obtained utilizing NASCET
criteria, using the distal internal carotid diameter as the
denominator.
CONTRAST:  Administered contrast not known at this time.

[Series 6: arterial thin · axial · arterial · 0.57mm/px · z∈[+1041,+1265]mm · 4 of 374 slices shown]
[im 75/374  soft-tissue]
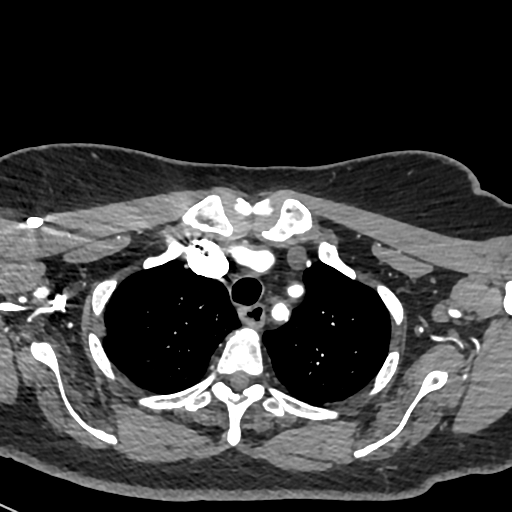
[im 150/374  bone]
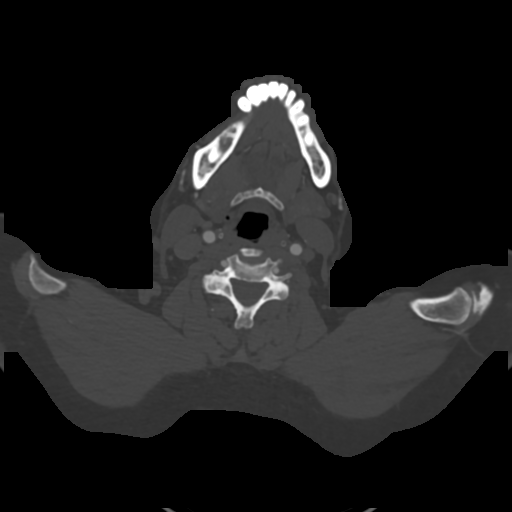
[im 224/374  soft-tissue]
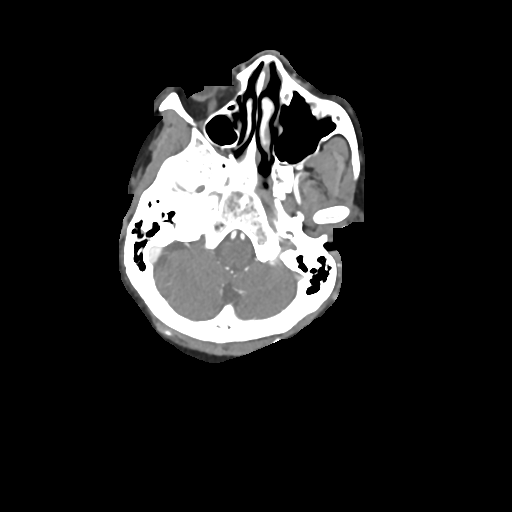
[im 299/374  bone]
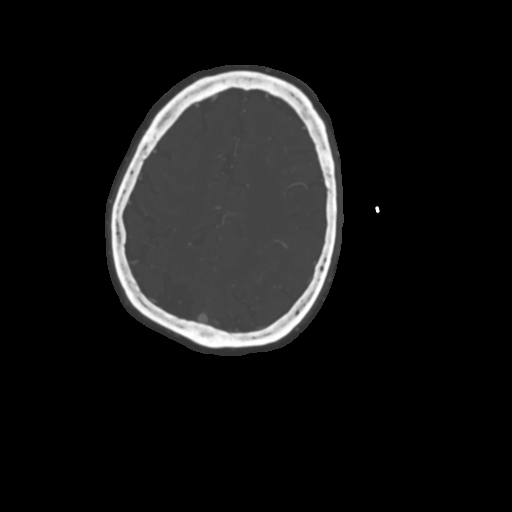

[Series 14: early thick sag · sagittal · arterial · 0.32mm/px · 1 of 38 slices shown]
[im 15/38  soft-tissue]
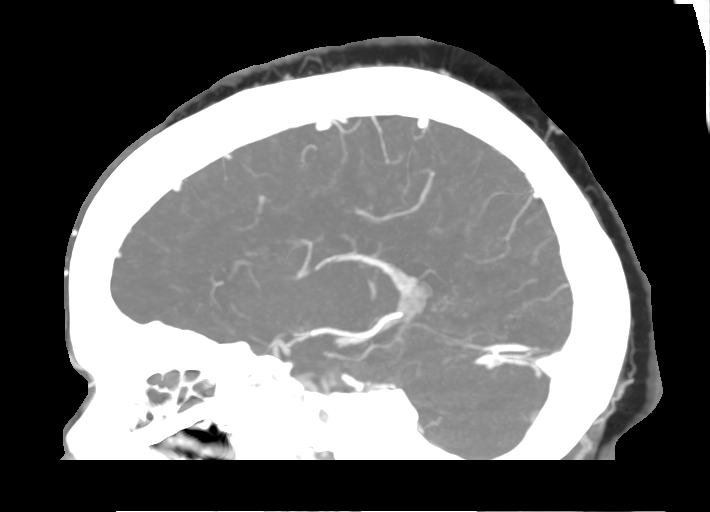

[5 of 35 positions shown; findings below may reference images not displayed]

FINDINGS: CTA NECK FINDINGS

Aortic arch: Common origin of the innominate and left common carotid
arteries. Atherosclerotic plaque within the visualized aortic arch
and proximal major branch vessels of the neck. No hemodynamically
significant innominate or proximal subclavian artery stenosis.

Right carotid system: The CCA is patent to the bifurcation without
stenosis. There is prominent mixed plaque within the proximal ICA
with high-grade stenosis and radiographic string sign at this site.
Distal to this, the ICA is patent within the neck without
significant stenosis

Left carotid system: The CCA is patent to the bifurcation without
stenosis. Mixed plaque within the proximal ICA. The cervical left
ICA becomes occluded shortly beyond its origin and remains occluded
throughout the remainder of the neck.

Vertebral arteries: The vertebral arteries are patent within the
neck. Mixed plaque results in high-grade stenosis at the origin of
the left vertebral artery. No significant stenosis within the
cervical right vertebral artery.

Skeleton: No acute bony abnormality or aggressive osseous lesion.

Other neck: No neck mass or cervical lymphadenopathy. Subcentimeter
thyroid nodules not meeting consensus criteria for ultrasound
follow-up.

Upper chest: No consolidation within the imaged lung apices.

Review of the MIP images confirms the above findings

CTA HEAD FINDINGS

Anterior circulation:

There is reconstitution of flow within the left ICA at the level of
the cavernous segment. However, this vessel remains asymmetrically
diminutive in caliber. Additionally, calcified plaque results in
moderate/severe stenosis of the distal cavernous/paraclinoid
segment.

The intracranial right ICA is patent. Calcified plaque within this
vessel. Up to moderate stenosis within the cavernous segment.

The M1 middle cerebral arteries are patent without significant
stenosis. There is occlusion of a proximal M2 left MCA branch vessel
(series 11, image 28) (series 6, images 264-266). No right M2
proximal branch occlusion or high-grade proximal stenosis is
identified.

The anterior cerebral arteries are patent. Hypoplastic left A1
segment.

Posterior circulation:

The intracranial vertebral arteries are patent. The basilar artery
is patent. The posterior cerebral arteries are patent. The posterior
communicating arteries are hypoplastic or absent bilaterally.

Venous sinuses: Within limitations of contrast timing, no convincing
thrombus.

Anatomic variants: As described

Review of the MIP images confirms the above findings

These results were called by telephone at the time of interpretation
on [DATE] at [DATE] to provider Dr. JOSHJAX, who verbally
acknowledged these results.
IMPRESSION: CTA neck:

1. The left ICA becomes occluded shortly beyond its origin and
remains occluded throughout the remainder of the neck.
2. High-grade near occlusive stenosis within the proximal right ICA
with radiographic string sign at this site.
3. The vertebral arteries are patent within the neck bilaterally.
Severe atherosclerotic narrowing at the origin of the left vertebral
artery

CTA head:

1. There is reconstitution of the intracranial left ICA at the level
of the cavernous segment. However, this vessel remains
asymmetrically diminutive. Superimposed moderate/severe
atherosclerotic stenosis within the distal cavernous/paraclinoid
segment.
2. Occlusion of a proximal left M2 MCA branch vessel.
3. Calcified plaque within the intracranial right ICA with up to
moderate stenosis within the cavernous segment.

## 2020-07-03 IMAGING — CT CT ANGIO HEAD-NECK
2 of 13 series · 5 of 34 positions shown · IV contrast (APPLIED)
Comparison: Noncontrast head CT performed earlier the same day.

CLINICAL DATA: Neuro deficit, acute, stroke suspected. Facial
droop, slurred speech.

EXAM:
CT ANGIOGRAPHY HEAD AND NECK
TECHNIQUE: Multidetector CT imaging of the head and neck was performed using
the standard protocol during bolus administration of intravenous
contrast. Multiplanar CT image reconstructions and MIPs were
obtained to evaluate the vascular anatomy. Carotid stenosis
measurements (when applicable) are obtained utilizing NASCET
criteria, using the distal internal carotid diameter as the
denominator.
CONTRAST:  Administered contrast not known at this time.

[Series 6: arterial thin · axial · arterial · 0.57mm/px · z∈[+1041,+1265]mm · 4 of 374 slices shown]
[im 75/374  soft-tissue]
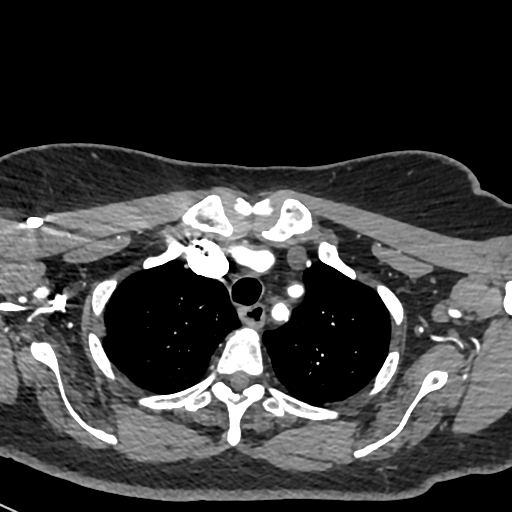
[im 150/374  bone]
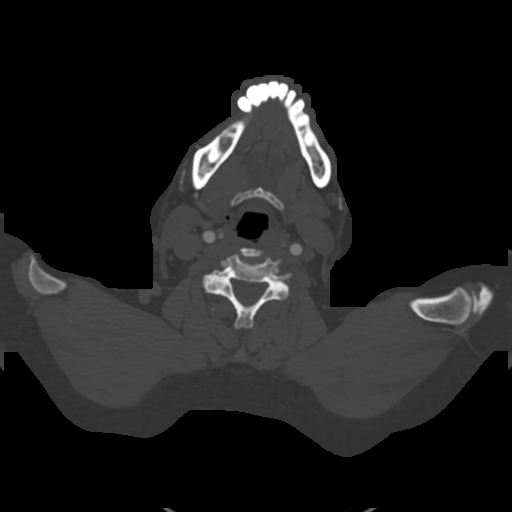
[im 224/374  soft-tissue]
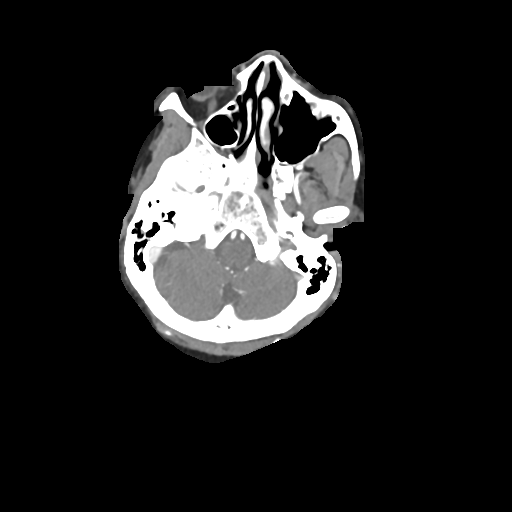
[im 299/374  bone]
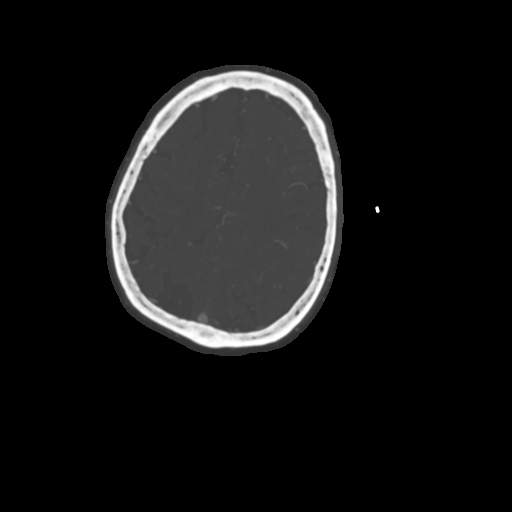

[Series 14: early thick sag · sagittal · arterial · 0.32mm/px · 1 of 38 slices shown]
[im 13/38  soft-tissue]
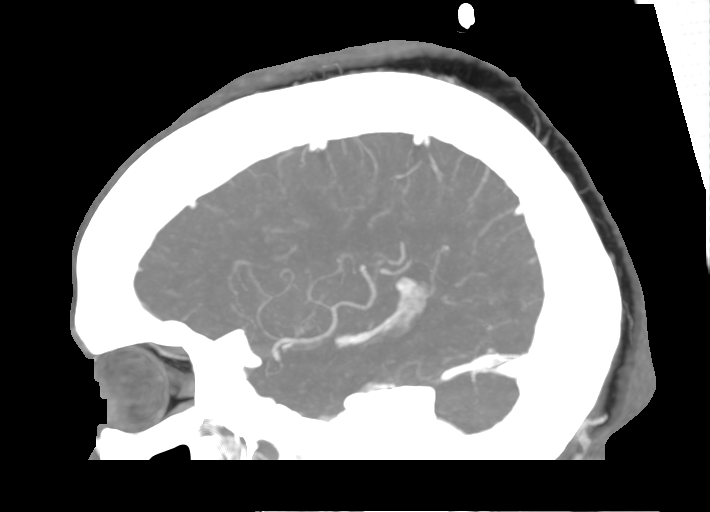

[5 of 34 positions shown; findings below may reference images not displayed]

FINDINGS: CTA NECK FINDINGS

Aortic arch: Common origin of the innominate and left common carotid
arteries. Atherosclerotic plaque within the visualized aortic arch
and proximal major branch vessels of the neck. No hemodynamically
significant innominate or proximal subclavian artery stenosis.

Right carotid system: The CCA is patent to the bifurcation without
stenosis. There is prominent mixed plaque within the proximal ICA
with high-grade stenosis and radiographic string sign at this site.
Distal to this, the ICA is patent within the neck without
significant stenosis

Left carotid system: The CCA is patent to the bifurcation without
stenosis. Mixed plaque within the proximal ICA. The cervical left
ICA becomes occluded shortly beyond its origin and remains occluded
throughout the remainder of the neck.

Vertebral arteries: The vertebral arteries are patent within the
neck. Mixed plaque results in high-grade stenosis at the origin of
the left vertebral artery. No significant stenosis within the
cervical right vertebral artery.

Skeleton: No acute bony abnormality or aggressive osseous lesion.

Other neck: No neck mass or cervical lymphadenopathy. Subcentimeter
thyroid nodules not meeting consensus criteria for ultrasound
follow-up.

Upper chest: No consolidation within the imaged lung apices.

Review of the MIP images confirms the above findings

CTA HEAD FINDINGS

Anterior circulation:

There is reconstitution of flow within the left ICA at the level of
the cavernous segment. However, this vessel remains asymmetrically
diminutive in caliber. Additionally, calcified plaque results in
moderate/severe stenosis of the distal cavernous/paraclinoid
segment.

The intracranial right ICA is patent. Calcified plaque within this
vessel. Up to moderate stenosis within the cavernous segment.

The M1 middle cerebral arteries are patent without significant
stenosis. There is occlusion of a proximal M2 left MCA branch vessel
(series 11, image 28) (series 6, images 264-266). No right M2
proximal branch occlusion or high-grade proximal stenosis is
identified.

The anterior cerebral arteries are patent. Hypoplastic left A1
segment.

Posterior circulation:

The intracranial vertebral arteries are patent. The basilar artery
is patent. The posterior cerebral arteries are patent. The posterior
communicating arteries are hypoplastic or absent bilaterally.

Venous sinuses: Within limitations of contrast timing, no convincing
thrombus.

Anatomic variants: As described

Review of the MIP images confirms the above findings

These results were called by telephone at the time of interpretation
on [DATE] at [DATE] to provider Dr. JOSHJAX, who verbally
acknowledged these results.
IMPRESSION: CTA neck:

1. The left ICA becomes occluded shortly beyond its origin and
remains occluded throughout the remainder of the neck.
2. High-grade near occlusive stenosis within the proximal right ICA
with radiographic string sign at this site.
3. The vertebral arteries are patent within the neck bilaterally.
Severe atherosclerotic narrowing at the origin of the left vertebral
artery

CTA head:

1. There is reconstitution of the intracranial left ICA at the level
of the cavernous segment. However, this vessel remains
asymmetrically diminutive. Superimposed moderate/severe
atherosclerotic stenosis within the distal cavernous/paraclinoid
segment.
2. Occlusion of a proximal left M2 MCA branch vessel.
3. Calcified plaque within the intracranial right ICA with up to
moderate stenosis within the cavernous segment.

## 2020-07-03 MED ORDER — CHLORHEXIDINE GLUCONATE CLOTH 2 % EX PADS
6.0000 | MEDICATED_PAD | Freq: Every day | CUTANEOUS | Status: DC
  Administered 2020-07-05 – 2020-07-11 (×6): 6 via TOPICAL

## 2020-07-03 MED ORDER — ACETAMINOPHEN 325 MG PO TABS: 650.0000 mg | ORAL_TABLET | ORAL | Status: DC | PRN

## 2020-07-03 MED ORDER — ASPIRIN 300 MG RE SUPP
300.0000 mg | Freq: Every day | RECTAL | Status: DC
  Administered 2020-07-03: 300 mg via RECTAL
  Filled 2020-07-03: qty 1

## 2020-07-03 MED ORDER — ACETAMINOPHEN 650 MG RE SUPP: 650.0000 mg | RECTAL | Status: DC | PRN

## 2020-07-03 MED ORDER — SODIUM CHLORIDE 0.9 % IV SOLN: INTRAVENOUS | Status: DC

## 2020-07-03 MED ORDER — ACETAMINOPHEN 160 MG/5ML PO SOLN: 650.0000 mg | ORAL | Status: DC | PRN

## 2020-07-03 MED ORDER — SODIUM CHLORIDE 0.9 % IV BOLUS
1000.0000 mL | Freq: Once | INTRAVENOUS | Status: AC
  Administered 2020-07-03: 1000 mL via INTRAVENOUS

## 2020-07-03 MED ORDER — CLOPIDOGREL BISULFATE 75 MG PO TABS: 75.0000 mg | ORAL_TABLET | Freq: Every day | ORAL | Status: DC

## 2020-07-03 MED ORDER — SODIUM CHLORIDE 0.9% FLUSH
3.0000 mL | Freq: Once | INTRAVENOUS | Status: AC
  Administered 2020-07-03: 3 mL via INTRAVENOUS

## 2020-07-03 MED ORDER — IOHEXOL 350 MG/ML SOLN
75.0000 mL | Freq: Once | INTRAVENOUS | Status: AC | PRN
  Administered 2020-07-03: 75 mL via INTRAVENOUS

## 2020-07-03 MED ORDER — STROKE: EARLY STAGES OF RECOVERY BOOK
Freq: Once | Status: AC
  Filled 2020-07-03: qty 1

## 2020-07-03 MED ORDER — SENNOSIDES-DOCUSATE SODIUM 8.6-50 MG PO TABS: 1.0000 | ORAL_TABLET | Freq: Every evening | ORAL | Status: DC | PRN

## 2020-07-03 MED ORDER — TICAGRELOR 90 MG PO TABS
180.0000 mg | ORAL_TABLET | Freq: Once | ORAL | Status: AC
  Administered 2020-07-03 – 2020-07-04 (×2): 90 mg via ORAL
  Filled 2020-07-03: qty 2

## 2020-07-03 MED ORDER — ENOXAPARIN SODIUM 40 MG/0.4ML ~~LOC~~ SOLN
40.0000 mg | SUBCUTANEOUS | Status: DC
  Administered 2020-07-03 – 2020-07-04 (×2): 40 mg via SUBCUTANEOUS
  Filled 2020-07-03 (×2): qty 0.4

## 2020-07-03 MED ORDER — SODIUM CHLORIDE 0.9 % BOLUS PEDS
1000.0000 mL | Freq: Once | INTRAVENOUS | Status: AC
  Administered 2020-07-03: 1000 mL via INTRAVENOUS

## 2020-07-03 MED ORDER — IOHEXOL 350 MG/ML SOLN
40.0000 mL | Freq: Once | INTRAVENOUS | Status: AC | PRN
  Administered 2020-07-03: 40 mL via INTRAVENOUS

## 2020-07-03 MED ORDER — ASPIRIN 81 MG PO CHEW
81.0000 mg | CHEWABLE_TABLET | Freq: Every day | ORAL | Status: DC
  Administered 2020-07-04: 81 mg via ORAL
  Filled 2020-07-03 (×2): qty 1

## 2020-07-03 NOTE — ED Triage Notes (Signed)
Pt last seen normal 1100 this morning. Pt noticed slurred speech, R sided weakness, R facial droop at 1500, although continued at work even though her R hand was not working.

## 2020-07-03 NOTE — ED Notes (Signed)
Neuro provider bedside updating family

## 2020-07-03 NOTE — ED Provider Notes (Signed)
[facility] EMERGENCY DEPARTMENT Provider Note   CSN: [id number] Arrival date & time: 07/03/20  1839  An emergency department physician performed an initial assessment on this suspected stroke patient at 1849.  History Chief Complaint  Patient presents with  . Code Stroke    Sharon Gilbert is a 79 y.o. female with past medical history of diabetes, presenting to the emergency department via EMS as a code stroke.  Level 5 caveat 2/t acuity of condition.  Per EMS, patient was last seen normal at 11 AM though reported to EMS that she began noticing her slurred speech around 3 PM this afternoon.  Last known normal was officially 3 PM.  She also began noting some right upper extremity weakness and right-sided facial droop.  EMS noted she was little bit slower with ambulation though no obvious deficit with her lower extremities.  She is also noted to be hypertensive by EMS.  The history is limited by the condition of the patient.       Past Medical History:  Diagnosis Date  . Cataract   . Diabetes mellitus without complication [facility])     Patient Active Problem List   Diagnosis Date Noted  . Acute ischemic left ICA stroke (HCC) 07/03/2020  . Numbness 06/27/2020    No past surgical history on file.   OB History   No obstetric history on file.     No family history on file.  Social History   Tobacco Use  . Smoking status: Never Smoker  . Smokeless tobacco: Never Used  Substance Use Topics  . Alcohol use: No  . Drug use: No    Home Medications Prior to Admission medications   Medication Sig Start Date End Date Taking? Authorizing Provider  aspirin EC 81 MG tablet Take 81 mg by mouth daily. 06/07/20   [provider]  dorzolamide-timolol (COSOPT) 22.3-6.8 MG/ML ophthalmic solution  06/26/20   [provider]  fluticasone (FLONASE) 50 MCG/ACT nasal spray Place 2 sprays into both nostrils daily. 11/13/15   Tharon Aquas, PA  hydrOXYzine  (ATARAX/VISTARIL) 25 MG tablet Take 0.5 tablets (12.5 mg total) by mouth every 8 (eight) hours as needed. 01/28/20   Darr, Veryl Speak, PA-C  latanoprost (XALATAN) 0.005 % ophthalmic solution SMARTSIG:1 Drop(s) In Eye(s) Every Evening 06/14/20   [provider]  lisinopril-hydrochlorothiazide (PRINZIDE,ZESTORETIC) 20-12.5 MG tablet TK 1 T PO QD 02/21/17   [provider]  topiramate (TOPAMAX) 25 MG tablet Take 1 tablet (25 mg total) by mouth 2 (two) times daily. 06/27/20 06/27/21  Micki Riley, MD    Allergies    Patient has no known allergies.  Review of Systems   Review of Systems  Neurological: Positive for facial asymmetry, speech difficulty and numbness.  All other systems reviewed and are negative.   Physical Exam Updated Vital Signs BP (!) 162/80   Pulse 86   Temp 98.3 F (36.8 C) (Oral)   Resp (!) 22   Wt 80.5 kg   SpO2 97%   BMI 29.53 kg/m   Physical Exam Vitals and nursing note reviewed.  Constitutional:      Appearance: She is well-developed.  HENT:     Head: Normocephalic and atraumatic.  Eyes:     Conjunctiva/sclera: Conjunctivae normal.  Cardiovascular:     Rate and Rhythm: Normal rate and regular rhythm.  Pulmonary:     Effort: Pulmonary effort is normal. No respiratory distress.     Breath sounds: Normal breath sounds.  Abdominal:     General: Bowel sounds are normal.     Palpations: Abdomen is soft.  Skin:    General: Skin is warm.  Neurological:     Mental Status: She is alert.     Comments: Speech is slurred. Right sided facial droop is present with flattening of right nasolabial fold. Right upper extremity is ataxic with 3-4/5 strength. EOM intact. No visual field deficit. No definite pronator drift. Sensation is grossly normal to facial distributions and all extremities. Gait deferred  Psychiatric:        Behavior: Behavior normal.     ED Results / Procedures / Treatments   Labs (all labs ordered are listed, but only abnormal  results are displayed) Labs Reviewed  COMPREHENSIVE METABOLIC PANEL - Abnormal; Notable for the following components:      Result Value   Potassium 3.4 (*)    CO2 20 (*)    Glucose, Bld 121 (*)    Creatinine, Ser 1.02 (*)    GFR calc non Af Amer 52 (*)    All other components within normal limits  CREATININE, SERUM - Abnormal; Notable for the following components:   GFR calc non Af Amer 53 (*)    All other components within normal limits  CBG MONITORING, ED - Abnormal; Notable for the following components:   Glucose-Capillary 125 (*)    All other components within normal limits  I-STAT CHEM 8, ED - Abnormal; Notable for the following components:   Potassium 3.3 (*)    Glucose, Bld 116 (*)    Calcium, Ion 1.12 (*)    All other components within normal limits  RESPIRATORY PANEL BY RT PCR (FLU A&B, COVID)  MRSA PCR SCREENING  PROTIME-INR  APTT  CBC  DIFFERENTIAL  CBC  HEMOGLOBIN A1C  LIPID PANEL  CBG MONITORING, ED    EKG EKG Interpretation  Date/Time:  Tuesday July 03 2020 19:23:56 EDT Ventricular Rate:  106 PR Interval:    QRS Duration: 84 QT Interval:  368 QTC Calculation: 489 R Axis:   60 Text Interpretation: Sinus tachycardia Multiple premature complexes, vent & supraven Anteroseptal infarct, age indeterminate Artifact Abnormal ekg Confirmed by Gerhard Munch [phone]) on 07/03/2020 7:33:57 PM   Radiology MR Brain Wo Contrast (neuro protocol)  Result Date: 07/03/2020 CLINICAL DATA:  Neuro deficit, acute, stroke suspected. Additional history provided: Slurred speech, right-sided weakness, right facial droop. EXAM: MRI HEAD WITHOUT CONTRAST TECHNIQUE: Multiplanar, multiecho pulse sequences of the brain and surrounding structures were obtained without intravenous contrast. COMPARISON:  CT angiogram head/neck and non-contrast CT head performed earlier the same day 07/03/2020. FINDINGS: Brain: Mild generalized cerebral atrophy. There are multifocal small foci of  restricted diffusion consistent with acute infarcts within the left frontal and parietal lobes, within the left MCA vascular territory. The largest infarct within the left parietal lobe postcentral gyrus measures 2.1 cm (series 3, image 18). The remaining acute infarcts are punctate or less than 5 mm in size. Corresponding T2/FLAIR hyperintensity at these sites. No evidence of intracranial mass. No chronic intracranial blood products. No extra-axial fluid collection. No midline shift. Vascular: Signal abnormality within the visualized distal cervical left ICA and pre cavernous left ICA consistent with occlusion demonstrated on CTA performed earlier the same day. Additionally, a proximal left M2 MCA branch occlusion was identified on this prior examination. Skull and upper cervical spine: No focal marrow lesion. Sinuses/Orbits: Visualized orbits show no acute finding. Mild ethmoid sinus mucosal thickening. No significant mastoid effusion. IMPRESSION: Multiple small  acute infarcts within the left frontal and parietal lobes within the left MCA vascular territory. The largest infarct within the left parietal lobe postcentral gyrus measures 2.1 cm. The remaining infarcts are punctate or less than 5 mm. Signal abnormality within the visualized distal cervical and pre cavernous left ICA consistent with vessel occlusion demonstrated at these sites on the prior CTA. A proximal left M2 MCA branch occlusion was better appreciated on this prior study. Electronically Signed   By: Jackey Loge DO   On: 07/03/2020 21:09   CT CEREBRAL PERFUSION W CONTRAST  Result Date: 07/03/2020 CLINICAL DATA:  Slurred speech with right-sided weakness and facial droop. EXAM: CT PERFUSION BRAIN TECHNIQUE: Multiphase CT imaging of the brain was performed following IV bolus contrast injection. Subsequent parametric perfusion maps were calculated using RAPID software. CONTRAST:  19mL OMNIPAQUE IOHEXOL 350 MG/ML SOLN COMPARISON:  None. FINDINGS: CT  Brain Perfusion Findings: CBF (<30%) Volume: 12mL Perfusion (Tmax>6.0s) volume: 85mL Mismatch Volume: 59mL ASPECTS on noncontrast CT Head: 10 at 7:05 p.m. today. Infarct Core: 0 mL Infarction Location:None IMPRESSION: 31 mL of ischemic penumbra involving the left frontal operculum without core infarct. These results were communicated to Dr. Erick Blinks at 9:31 pm on 07/03/2020 by text page via the [facility] messaging system. Electronically Signed   By: Deatra  M.D.   On: 07/03/2020 21:31   CT HEAD CODE STROKE WO CONTRAST  Result Date: 07/03/2020 CLINICAL DATA:  Code stroke. Neuro deficit, acute, stroke suspected. Additional history provided: Last known well 1500, right-sided facial droop; slurred speech. EXAM: CT HEAD WITHOUT CONTRAST TECHNIQUE: Contiguous axial images were obtained from the base of the skull through the vertex without intravenous contrast. COMPARISON:  Head CT 04/24/2006 FINDINGS: Brain: Mild generalized cerebral atrophy, progressed as compared to the head CT of 04/24/2006. There is no acute intracranial hemorrhage. No demarcated cortical infarct is identified. No extra-axial fluid collection. No evidence of intracranial mass. No midline shift. Vascular: No hyperdense vessel. Skull: Normal. Negative for fracture or focal lesion. Sinuses/Orbits: Visualized orbits show no acute finding. No significant paranasal sinus disease or mastoid effusion at the imaged levels. ASPECTS (Alberta Stroke Program Early CT Score) - Ganglionic level infarction (caudate, lentiform nuclei, internal capsule, insula, M1-M3 cortex): 7 - Supraganglionic infarction (M4-M6 cortex): 3 Total score (0-10 with 10 being normal): 10 These results were called by telephone at the time of interpretation on 07/03/2020 at 7:03 pm to provider Dr. Thomasena Edis of neurology, who verbally acknowledged these results. IMPRESSION: No evidence of acute intracranial abnormality. Mild generalized cerebral atrophy, progressed as compared to  the head CT of 04/24/2006. Electronically Signed   By: Jackey Loge DO   On: 07/03/2020 19:05   CT ANGIO HEAD CODE STROKE  Result Date: 07/03/2020 CLINICAL DATA:  Neuro deficit, acute, stroke suspected. Facial droop, slurred speech. EXAM: CT ANGIOGRAPHY HEAD AND NECK TECHNIQUE: Multidetector CT imaging of the head and neck was performed using the standard protocol during bolus administration of intravenous contrast. Multiplanar CT image reconstructions and MIPs were obtained to evaluate the vascular anatomy. Carotid stenosis measurements (when applicable) are obtained utilizing NASCET criteria, using the distal internal carotid diameter as the denominator. CONTRAST:  Administered contrast not known at this time. COMPARISON:  Noncontrast head CT performed earlier the same day. FINDINGS: CTA NECK FINDINGS Aortic arch: Common origin of the innominate and left common carotid arteries. Atherosclerotic plaque within the visualized aortic arch and proximal major branch vessels of the neck. No hemodynamically significant innominate or proximal subclavian artery  stenosis. Right carotid system: The CCA is patent to the bifurcation without stenosis. There is prominent mixed plaque within the proximal ICA with high-grade stenosis and radiographic string sign at this site. Distal to this, the ICA is patent within the neck without significant stenosis Left carotid system: The CCA is patent to the bifurcation without stenosis. Mixed plaque within the proximal ICA. The cervical left ICA becomes occluded shortly beyond its origin and remains occluded throughout the remainder of the neck. Vertebral arteries: The vertebral arteries are patent within the neck. Mixed plaque results in high-grade stenosis at the origin of the left vertebral artery. No significant stenosis within the cervical right vertebral artery. Skeleton: No acute bony abnormality or aggressive osseous lesion. Other neck: No neck mass or cervical lymphadenopathy.  Subcentimeter thyroid nodules not meeting consensus criteria for ultrasound follow-up. Upper chest: No consolidation within the imaged lung apices. Review of the MIP images confirms the above findings CTA HEAD FINDINGS Anterior circulation: There is reconstitution of flow within the left ICA at the level of the cavernous segment. However, this vessel remains asymmetrically diminutive in caliber. Additionally, calcified plaque results in moderate/severe stenosis of the distal cavernous/paraclinoid segment. The intracranial right ICA is patent. Calcified plaque within this vessel. Up to moderate stenosis within the cavernous segment. The M1 middle cerebral arteries are patent without significant stenosis. There is occlusion of a proximal M2 left MCA branch vessel (series 11, image 28) (series 6, images 264-266). No right M2 proximal branch occlusion or high-grade proximal stenosis is identified. The anterior cerebral arteries are patent. Hypoplastic left A1 segment. Posterior circulation: The intracranial vertebral arteries are patent. The basilar artery is patent. The posterior cerebral arteries are patent. The posterior communicating arteries are hypoplastic or absent bilaterally. Venous sinuses: Within limitations of contrast timing, no convincing thrombus. Anatomic variants: As described Review of the MIP images confirms the above findings These results were called by telephone at the time of interpretation on 07/03/2020 at 7:37 pm to provider Dr. Thomasena Edis, who verbally acknowledged these results. IMPRESSION: CTA neck: 1. The left ICA becomes occluded shortly beyond its origin and remains occluded throughout the remainder of the neck. 2. High-grade near occlusive stenosis within the proximal right ICA with radiographic string sign at this site. 3. The vertebral arteries are patent within the neck bilaterally. Severe atherosclerotic narrowing at the origin of the left vertebral artery CTA head: 1. There is  reconstitution of the intracranial left ICA at the level of the cavernous segment. However, this vessel remains asymmetrically diminutive. Superimposed moderate/severe atherosclerotic stenosis within the distal cavernous/paraclinoid segment. 2. 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 3. Calcified plaque within the intracranial right ICA with up to moderate stenosis within the cavernous segment. Electronically Signed   By: Jackey Loge DO   On: 07/03/2020 19:38   CT ANGIO NECK CODE STROKE  Result Date: 07/03/2020 CLINICAL DATA:  Neuro deficit, acute, stroke suspected. Facial droop, slurred speech. EXAM: CT ANGIOGRAPHY HEAD AND NECK TECHNIQUE: Multidetector CT imaging of the head and neck was performed using the standard protocol during bolus administration of intravenous contrast. Multiplanar CT image reconstructions and MIPs were obtained to evaluate the vascular anatomy. Carotid stenosis measurements (when applicable) are obtained utilizing NASCET criteria, using the distal internal carotid diameter as the denominator. CONTRAST:  Administered contrast not known at this time. COMPARISON:  Noncontrast head CT performed earlier the same day. FINDINGS: CTA NECK FINDINGS Aortic arch: Common origin of the innominate and left common carotid arteries.  Atherosclerotic plaque within the visualized aortic arch and proximal major branch vessels of the neck. No hemodynamically significant innominate or proximal subclavian artery stenosis. Right carotid system: The CCA is patent to the bifurcation without stenosis. There is prominent mixed plaque within the proximal ICA with high-grade stenosis and radiographic string sign at this site. Distal to this, the ICA is patent within the neck without significant stenosis Left carotid system: The CCA is patent to the bifurcation without stenosis. Mixed plaque within the proximal ICA. The cervical left ICA becomes occluded shortly beyond its origin and remains occluded  throughout the remainder of the neck. Vertebral arteries: The vertebral arteries are patent within the neck. Mixed plaque results in high-grade stenosis at the origin of the left vertebral artery. No significant stenosis within the cervical right vertebral artery. Skeleton: No acute bony abnormality or aggressive osseous lesion. Other neck: No neck mass or cervical lymphadenopathy. Subcentimeter thyroid nodules not meeting consensus criteria for ultrasound follow-up. Upper chest: No consolidation within the imaged lung apices. Review of the MIP images confirms the above findings CTA HEAD FINDINGS Anterior circulation: There is reconstitution of flow within the left ICA at the level of the cavernous segment. However, this vessel remains asymmetrically diminutive in caliber. Additionally, calcified plaque results in moderate/severe stenosis of the distal cavernous/paraclinoid segment. The intracranial right ICA is patent. Calcified plaque within this vessel. Up to moderate stenosis within the cavernous segment. The M1 middle cerebral arteries are patent without significant stenosis. There is occlusion of a proximal M2 left MCA branch vessel (series 11, image 28) (series 6, images 264-266). No right M2 proximal branch occlusion or high-grade proximal stenosis is identified. The anterior cerebral arteries are patent. Hypoplastic left A1 segment. Posterior circulation: The intracranial vertebral arteries are patent. The basilar artery is patent. The posterior cerebral arteries are patent. The posterior communicating arteries are hypoplastic or absent bilaterally. Venous sinuses: Within limitations of contrast timing, no convincing thrombus. Anatomic variants: As described Review of the MIP images confirms the above findings These results were called by telephone at the time of interpretation on 07/03/2020 at 7:37 pm to provider Dr. Thomasena Edis, who verbally acknowledged these results. IMPRESSION: CTA neck: 1. The left ICA  becomes occluded shortly beyond its origin and remains occluded throughout the remainder of the neck. 2. High-grade near occlusive stenosis within the proximal right ICA with radiographic string sign at this site. 3. The vertebral arteries are patent within the neck bilaterally. Severe atherosclerotic narrowing at the origin of the left vertebral artery CTA head: 1. There is reconstitution of the intracranial left ICA at the level of the cavernous segment. However, this vessel remains asymmetrically diminutive. Superimposed moderate/severe atherosclerotic stenosis within the distal cavernous/paraclinoid segment. 2. 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 3. Calcified plaque within the intracranial right ICA with up to moderate stenosis within the cavernous segment. Electronically Signed   By: Jackey Loge DO   On: 07/03/2020 19:38    Procedures .Critical Care Performed by: Joyelle Siedlecki, [place] N, PA-C Authorized by: Dionta Larke, [place] N, PA-C   Critical care provider statement:    Critical care time (minutes):  45   Critical care time was exclusive of:  Separately billable procedures and treating other patients and teaching time   Critical care was necessary to treat or prevent imminent or life-threatening deterioration of the following conditions:  CNS failure or compromise   Critical care was time spent personally by me on the following activities:  Discussions with consultants, evaluation  of patient's response to treatment, examination of patient, ordering and performing treatments and interventions, ordering and review of laboratory studies, ordering and review of radiographic studies, pulse oximetry, re-evaluation of patient's condition, obtaining history from patient or surrogate and review of old charts   I assumed direction of critical care for this patient from another provider in my specialty: no     (including critical care time)  Medications Ordered in ED Medications  sodium  chloride flush (NS) 0.9 % injection 3 mL (has no administration in time range)  0.9 %  sodium chloride infusion ( Intravenous Rate/Dose Verify 07/03/20 2300)  aspirin chewable tablet 81 mg (has no administration in time range)    Or  aspirin suppository 300 mg (has no administration in time range)   stroke: mapping our early stages of recovery book (has no administration in time range)  acetaminophen (TYLENOL) tablet 650 mg (has no administration in time range)    Or  acetaminophen (TYLENOL) 160 MG/5ML solution 650 mg (has no administration in time range)    Or  acetaminophen (TYLENOL) suppository 650 mg (has no administration in time range)  senna-docusate (Senokot-S) tablet 1 tablet (has no administration in time range)  enoxaparin (LOVENOX) injection 40 mg (has no administration in time range)  ticagrelor (BRILINTA) tablet 180 mg (has no administration in time range)  Chlorhexidine Gluconate Cloth 2 % PADS 6 each (has no administration in time range)  iohexol (OMNIPAQUE) 350 MG/ML injection 75 mL (75 mLs Intravenous Contrast Given 07/03/20 1906)  sodium chloride 0.9 % bolus 1,000 mL (0 mLs Intravenous Stopped 07/03/20 2252)  iohexol (OMNIPAQUE) 350 MG/ML injection 40 mL (40 mLs Intravenous Contrast Given 07/03/20 2112)    ED Course  I have reviewed the triage vital signs and the nursing notes.  Pertinent labs & imaging results that were available during my care of the patient were reviewed by me and considered in my medical decision making (see chart for details).  Clinical Course as of Jul 03 2321  Tue Jul 03, 2020  1914 Patient presenting by EMS as code stroke.  Last known normal was 3 PM this afternoon.  She began noticing slurred speech and then noticed right upper extremity weakness, right-sided droop was present.  EMS noted she was a little bit slower with ambulation of her right leg though did not appreciate a particular weakness on exam.  Also noted to be hypertensive. On evaluation  here, NIH score of 2 per stroke team.  She has some mild residual right upper extremity weakness though no drift was appreciated. slurred speech with right facial droop though is noted. No visual field deficits. CT/ CTA head and neck done. Per neurologist, will hold TPA at this time.  Will order MRI, admit to hospital service for further management.   [JR]  2109 Repeat neuro exam: Patient with right upper extremity weakness, 3-4/5 strength. Right upper extremity remains ataxic Right sided facial droop is present.  Speech seems less slurred.  Equal strength and sensation to all extremities, face.  Lower extremities with equal strength.   [JR]    Clinical Course User Index [JR] Sondos Wolfman, [place] N, PA-C   MDM Rules/Calculators/A&P                          Patient presenting with acute stroke, last known normal 3 PM this afternoon.  Presenting with right upper extremity weakness and facial droop, slurred speech.  She is also hypertensive on arrival.  Code stroke with neuro hospitalist present. initial CTA of the head and neck reveal left ICA occlusion, proximal left M2 MCA branch occlusion.  It was decided per neurology to hold TPA, proceed with medical management. Agree with care plan.   Follow-up MRI with multiple small acute infarcts within the left frontal and parietal lobes within the left MCA vascular territory.  Labs are unremarkable for acute significant findings.  Patient will be admitted to critical care for further management.  Plan to support hypertension, induce HTN if necessary to perfuse brain.  Final Clinical Impression(s) / ED Diagnoses Final diagnoses:  Acute ischemic left ICA stroke [facility](HCC)    Rx / DC Orders ED Discharge Orders    None       Amalea Ottey, [place] N, PA-C 07/03/20 2322    Gerhard MunchLockwood, Robert, MD 07/04/20 2308

## 2020-07-03 NOTE — ED Notes (Signed)
Pt is NPO - no swallow screen per provider

## 2020-07-03 NOTE — ED Notes (Signed)
Pt to MRI.  Son has left.

## 2020-07-03 NOTE — Code Documentation (Signed)
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 She was at work when at 1500 she noticed she had trouble using her right hand and she had some slurred speech. Pt stayed at work. When she got home her son noticed she had slurred speech and a facial droop. [facility] was called and activated the code stroke. Pt taken to [facility] and EDP cleared her for a CT. CT and CTA completed. Pt with no hx of stroke and is not on blood thinners. Pt with an NIHSS of 2 for slurred speech and facial droop. She has some weakness and difficulty using her right arm but no drift. Pt "too good to treat" with TPA at this time. She is negative for LVO on her scan according to MD. When pt was back in her room in the ED she had a 5 second event of aphasia when she was sat up to get her undressed and using a purewick. It did not last long and she was able to name all the objects on the NIHSS. MD aware and order for NS bolus given. Pt to be bedrest and left flat. RN at bedside. Pt is now outside the window for TPA. She is going for an MRI. Care Plan: q2x12 vitals/mNIHSS then q4. Hand off with Victorino Dike, RN. Rosina Cressler, [place] Scrape, RN

## 2020-07-03 NOTE — H&P (Addendum)
Neurology H&P  CC: right sided weakness and slurred speech.  History is obtained from:Patient her son, EMS and nursing staff  HPI: Sharon Gilbert is a 79 y.o. female right handed independent (her son lives with her) got home from work and her son noticed slurred speech, mild face droop and trouble closing the zipper on her jeans so called EMS. She is on aspirin 81mg  daily.  LKW: 1500 tpa given?: No, no significant deficits IR Thrombectomy? No, Low NIHSS  Modified Rankin Scale: 0-Completely asymptomatic and back to baseline post- stroke NIHSS: 2   ROS: A complete ROS was performed and is negative except as noted in the HPI.   Past Medical History:  Diagnosis Date  . Cataract   . Diabetes mellitus without complication (HCC)    No family history on file.  Social History:  reports that she has never smoked. She has never used smokeless tobacco. She reports that she does not drink alcohol and does not use drugs.   Prior to Admission medications   Medication Sig Start Date End Date Taking? Authorizing Provider  aspirin EC 81 MG tablet Take 81 mg by mouth daily. 06/07/20   [provider]  dorzolamide-timolol (COSOPT) 22.3-6.8 MG/ML ophthalmic solution  06/26/20   [provider]  fluticasone (FLONASE) 50 MCG/ACT nasal spray Place 2 sprays into both nostrils daily. 11/13/15   01/11/16, PA  hydrOXYzine (ATARAX/VISTARIL) 25 MG tablet Take 0.5 tablets (12.5 mg total) by mouth every 8 (eight) hours as needed. 01/28/20   Darr, 01/30/20, PA-C  latanoprost (XALATAN) 0.005 % ophthalmic solution SMARTSIG:1 Drop(s) In Eye(s) Every Evening 06/14/20   [provider]  lisinopril-hydrochlorothiazide (PRINZIDE,ZESTORETIC) 20-12.5 MG tablet TK 1 T PO QD 02/21/17   [provider]  topiramate (TOPAMAX) 25 MG tablet Take 1 tablet (25 mg total) by mouth 2 (two) times daily. 06/27/20 06/27/21  06/29/21, MD   Exam: Current vital signs: BP (!) 170/80   Pulse 91    Resp (!) 22   Wt 80.5 kg   SpO2 99%   BMI 29.53 kg/m    Physical Exam  Constitutional: Appears well-developed and well-nourished.  Psych: Affect appropriate to situation Eyes: Mild injected sclera HENT: No OP obstrucion Head: Normocephalic.  Cardiovascular: Normal rate and regular rhythm.  Respiratory: Effort normal and breath sounds normal to anterior ascultation GI: Soft.  No distension. There is no tenderness.  Skin: WDI  Neuro: Mental Status: Patient is awake, alert, oriented to person, place, month, year, and situation. Patient is able to give a clear and coherent history. No signs of aphasia or neglect Cranial Nerves: II: Visual Fields are full. Pupils are equal, round, and reactive to light.   III,IV, VI: EOMI without ptosis or diploplia.  V: Facial sensation is symmetric to temperature VII: Facial movement is symmetric.  VIII: hearing is intact to voice X: Uvula elevates symmetrically XI: Shoulder shrug is symmetric. XII: tongue is midline without atrophy or fasciculations.  Motor: Tone is normal. Bulk is normal. 5/5 strength was present in all four extremities.  Sensory: Sensation is symmetric to light touch and temperature in the arms and legs. Deep Tendon Reflexes: 2+ and symmetric in the biceps and patellae.  Plantars: Toes are downgoing bilaterally.  Cerebellar: FNF and HKS are intact bilaterally  I have reviewed the images obtained: NCT head showed no evidence of acute intracranial abnormality. CTA H&N showed proximal left ICA occlusion extending to supraclinoid with reconstitution and probable mid-M2 Occlusion. High-grade  near occlusive stenosis within the proximal right ICA.  Primary Diagnosis:  Cerebral infarction due to thrombosis of left middle cerebral artery.   Secondary Diagnosis: HTN Obesity  Impression: 79 year old right handed woman with symptoms of stroke. Exam did not reveal aphasia dysarthria or drift in extremities or other neurologic  dificits and tPA was not given. The patient was noted to have tandem occlusion left ICA and M2 and discussion with neuro intervention and due to low NIHSS not an immediate candidate for intervention. The patient was noted to has transient aphasia on sitting her up and a bolus of fluid was given and patient to lay flat. Neuro intervention recommended close neurologic monitoring for acute changes and repeat vascular imaging in the morning and if the patient deteriorates will evaluate for emergent intervention.  Plan: MRI brain STAT followed by CT perfusion. SBP 160-170 Lay flat and minimize movement. Continue aspirin. Will admit to ICU for close neuro monitoring. If the patient has acute changes notify neurology immediately and repeat CT/CTA head and neck for possible intervention.  Discussed with patient and son. Discussed with ED.  This patient is critically ill and at significant risk of neurological worsening, death and care requires constant monitoring of vital signs, hemodynamics,respiratory and cardiac monitoring, neurological assessment, discussion with family, other specialists and medical decision making of high complexity. I spent 75 minutes of neurocritical care time  in the care of  this patient. This was time spent independent of any time provided by nurse practitioner or PA.  Electronically signed by: Dr. Marisue Humble Pager: [phone]

## 2020-07-03 NOTE — ED Notes (Signed)
Pt back from MRI - To CT.    Neuro check first - pt speech is clear, no weakness detected in either arm.

## 2020-07-04 ENCOUNTER — Encounter (HOSPITAL_COMMUNITY): Payer: Self-pay | Admitting: Neurology

## 2020-07-04 ENCOUNTER — Inpatient Hospital Stay (HOSPITAL_COMMUNITY): Payer: Medicare HMO | Admitting: Anesthesiology

## 2020-07-04 ENCOUNTER — Encounter (HOSPITAL_COMMUNITY)
Admission: EM | Disposition: A | Discharge status: Rehabilitation Facility | Payer: Self-pay | Source: Home / Self Care | Attending: Neurology

## 2020-07-04 ENCOUNTER — Inpatient Hospital Stay (HOSPITAL_COMMUNITY): Payer: Medicare HMO

## 2020-07-04 DIAGNOSIS — I63232 Cerebral infarction due to unspecified occlusion or stenosis of left carotid arteries: Secondary | ICD-10-CM | POA: Diagnosis not present

## 2020-07-04 DIAGNOSIS — I6389 Other cerebral infarction: Secondary | ICD-10-CM | POA: Diagnosis not present

## 2020-07-04 DIAGNOSIS — I6602 Occlusion and stenosis of left middle cerebral artery: Secondary | ICD-10-CM | POA: Insufficient documentation

## 2020-07-04 HISTORY — PX: IR CT HEAD LTD: IMG2386

## 2020-07-04 HISTORY — PX: IR ANGIO VERTEBRAL SEL SUBCLAVIAN INNOMINATE UNI R MOD SED: IMG5365

## 2020-07-04 HISTORY — PX: RADIOLOGY WITH ANESTHESIA: SHX6223

## 2020-07-04 HISTORY — PX: IR PERCUTANEOUS ART THROMBECTOMY/INFUSION INTRACRANIAL INC DIAG ANGIO: IMG6087

## 2020-07-04 HISTORY — PX: IR INTRAVSC STENT CERV CAROTID W/O EMB-PROT MOD SED INC ANGIO: IMG2304

## 2020-07-04 HISTORY — PX: IR INTRA CRAN STENT: IMG2345

## 2020-07-04 LAB — MRSA PCR SCREENING: MRSA by PCR: NEGATIVE

## 2020-07-04 LAB — TYPE AND SCREEN
ABO/RH(D): A POS
Antibody Screen: NEGATIVE

## 2020-07-04 LAB — LIPID PANEL
Cholesterol: 275 mg/dL — ABNORMAL HIGH (ref 0–200)
HDL: 46 mg/dL (ref >40)
LDL Cholesterol: 202 mg/dL — ABNORMAL HIGH (ref 0–99)
Total CHOL/HDL Ratio: 6 RATIO
Triglycerides: 136 mg/dL (ref <150)
VLDL: 27 mg/dL (ref 0–40)

## 2020-07-04 LAB — ECHOCARDIOGRAM COMPLETE
Area-P 1/2: 2.55 cm2
S' Lateral: 2.5 cm
Weight: 2839.52 oz

## 2020-07-04 LAB — PLATELET INHIBITION P2Y12: Platelet Function  P2Y12: 19 [PRU] — ABNORMAL LOW (ref 182–335)

## 2020-07-04 LAB — ABO/RH: ABO/RH(D): A POS

## 2020-07-04 LAB — POCT I-STAT 7, (LYTES, BLD GAS, ICA,H+H)
Acid-base deficit: 4 mmol/L — ABNORMAL HIGH (ref 0.0–2.0)
Bicarbonate: 21.7 mmol/L (ref 20.0–28.0)
Calcium, Ion: 1.18 mmol/L (ref 1.15–1.40)
HCT: 34 % — ABNORMAL LOW (ref 36.0–46.0)
Hemoglobin: 11.6 g/dL — ABNORMAL LOW (ref 12.0–15.0)
O2 Saturation: 100 %
Patient temperature: 98.2
Potassium: 3.1 mmol/L — ABNORMAL LOW (ref 3.5–5.1)
Sodium: 141 mmol/L (ref 135–145)
TCO2: 23 mmol/L (ref 22–32)
pCO2 arterial: 41.2 mmHg (ref 32.0–48.0)
pH, Arterial: 7.33 — ABNORMAL LOW (ref 7.350–7.450)
pO2, Arterial: 395 mmHg — ABNORMAL HIGH (ref 83.0–108.0)

## 2020-07-04 LAB — HEMOGLOBIN A1C
Hgb A1c MFr Bld: 6.1 % — ABNORMAL HIGH (ref 4.8–5.6)
Mean Plasma Glucose: 128.37 mg/dL

## 2020-07-04 IMAGING — DX DG ABD PORTABLE 1V
1 series · 1 of 1 positions shown · non-contrast
Comparison: None.

CLINICAL DATA: OG tube placement

EXAM:
PORTABLE ABDOMEN - 1 VIEW

[abdomen kub]
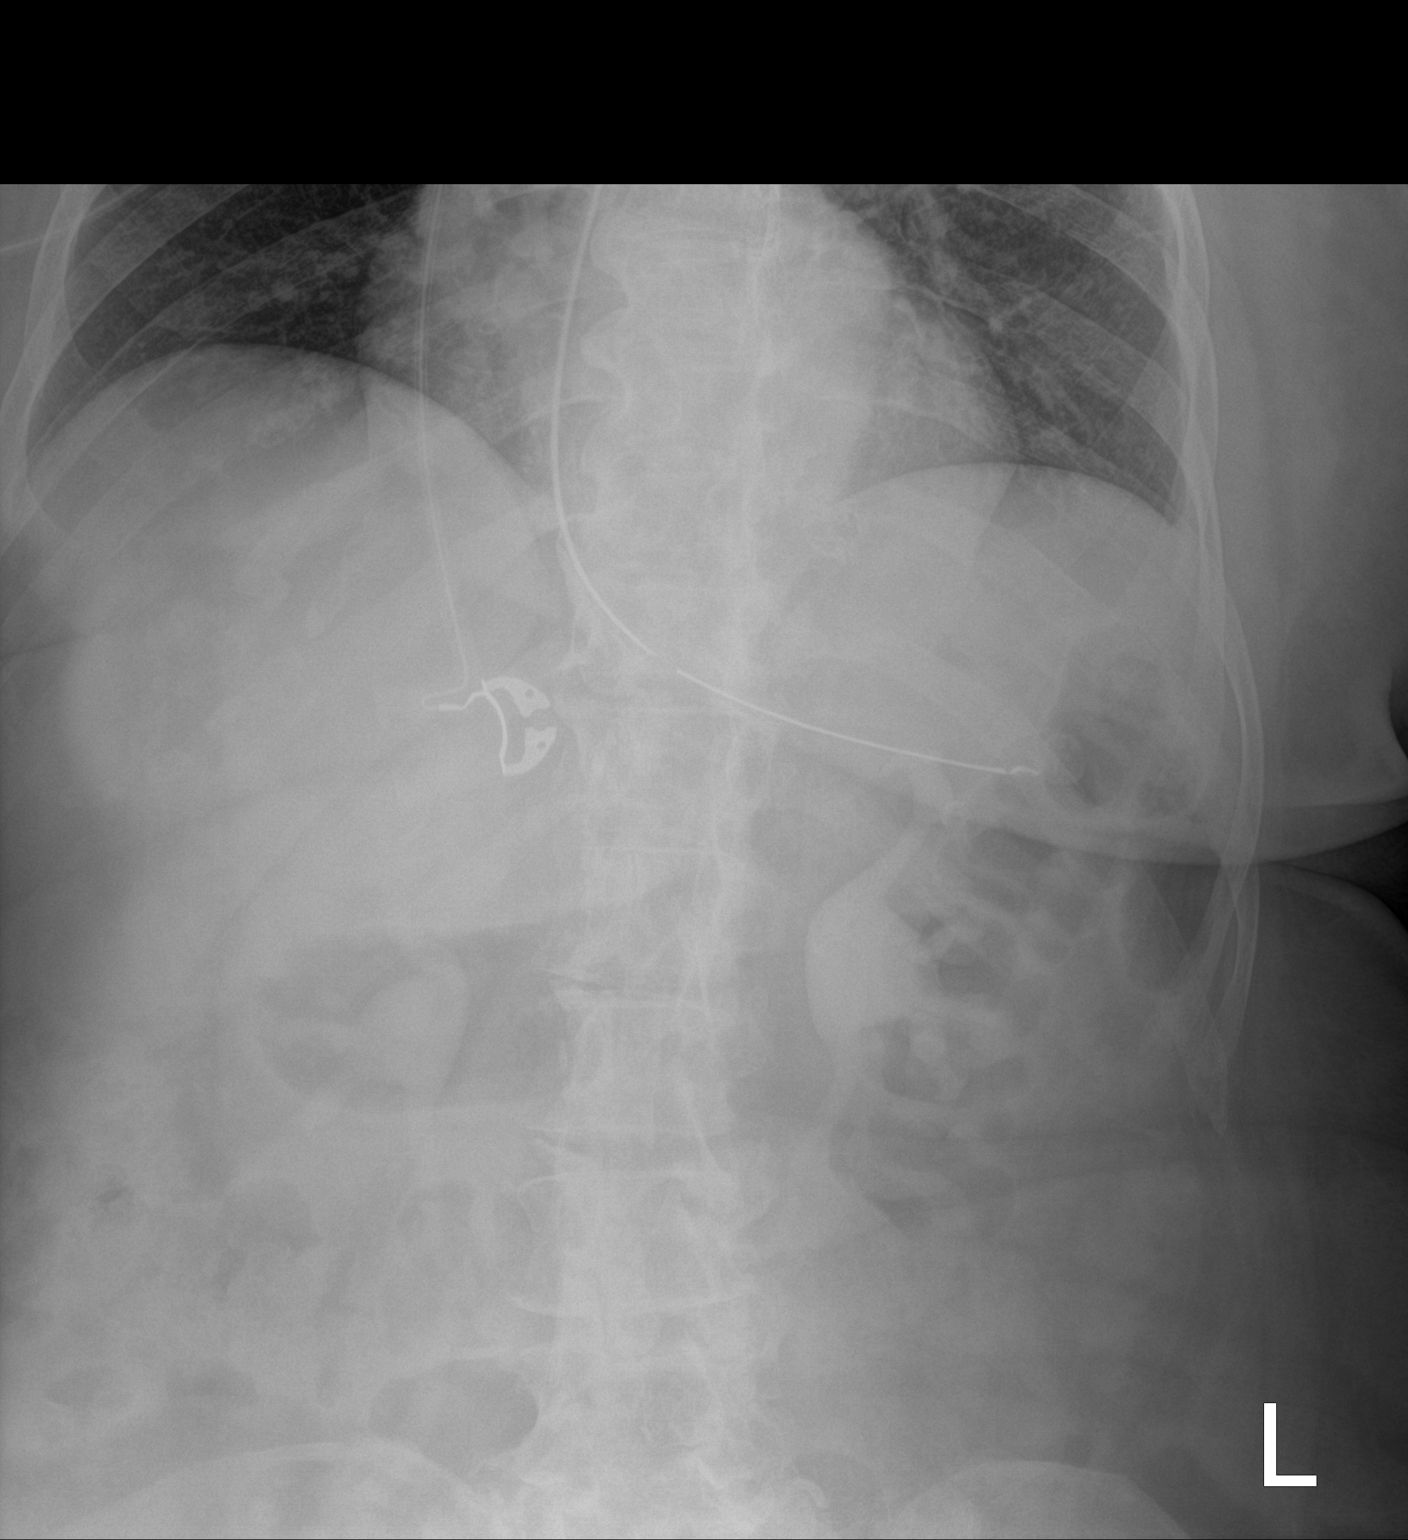

[1 of 1 positions shown; findings below may reference images not displayed]

FINDINGS: Esophageal tube tip overlies the gastric fundus, side-port in the
region of GE junction. Upper gas pattern is unremarkable
IMPRESSION: Esophageal tube tip overlies the gastric fundus with side-port at
the GE junction, consider further advancement for more optimal
positioning.

## 2020-07-04 IMAGING — DX DG CHEST 1V PORT
1 series · 1 of 1 positions shown · non-contrast
Comparison: None.

CLINICAL DATA: Intubated

EXAM:
PORTABLE CHEST 1 VIEW

[chest ap]
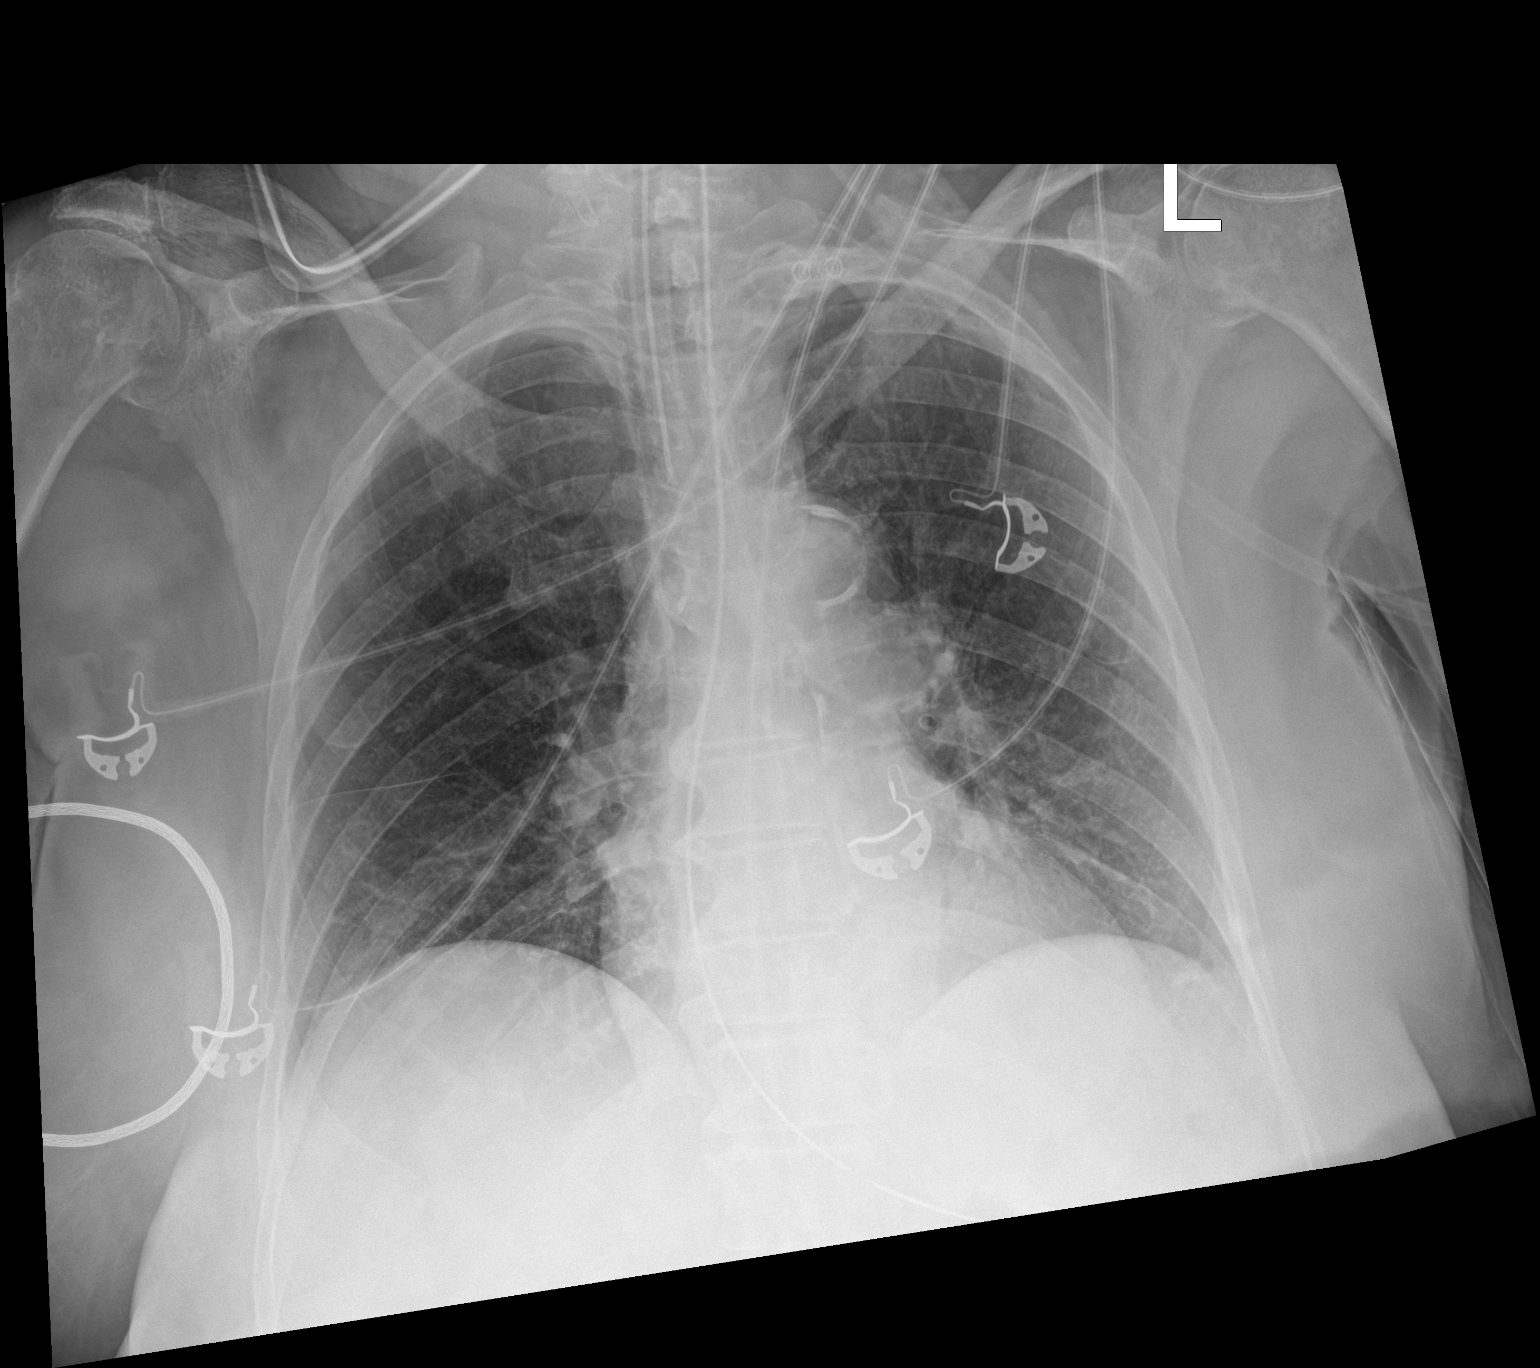

[1 of 1 positions shown; findings below may reference images not displayed]

FINDINGS: Endotracheal tube tip about 4 cm superior to the carina. Esophageal
tube tip is below the diaphragm but is incompletely visualized. No
focal opacity or pleural effusion. Normal heart size. Aortic
atherosclerosis. No pneumothorax.
IMPRESSION: Endotracheal tube tip about 4 cm superior to the carina. No acute
pulmonary infiltrate.

## 2020-07-04 IMAGING — CT CT HEAD W/O CM
4 series · 15 of 47 positions shown, 17 images · non-contrast
Comparison: CT angio head and neck and MRI head [DATE]
COMPARISON: CT angio head and neck and MRI head [DATE]

Addendum:
CLINICAL DATA: Stroke follow-up

EXAM:
CT HEAD WITHOUT CONTRAST
TECHNIQUE: Contiguous axial images were obtained from the base of the skull
through the vertex without intravenous contrast.

[Series 3: head wo · axial · 0.45mm/px · z∈[+1200,+1314]mm · 7 of 31 slices shown, 9 images]
[im 4/31  brain]
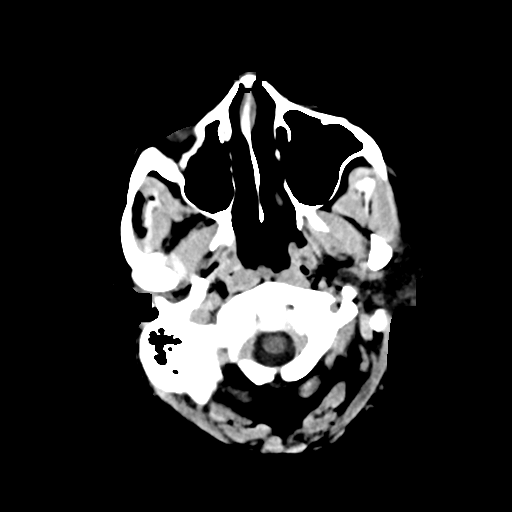
[im 4/31  bone]
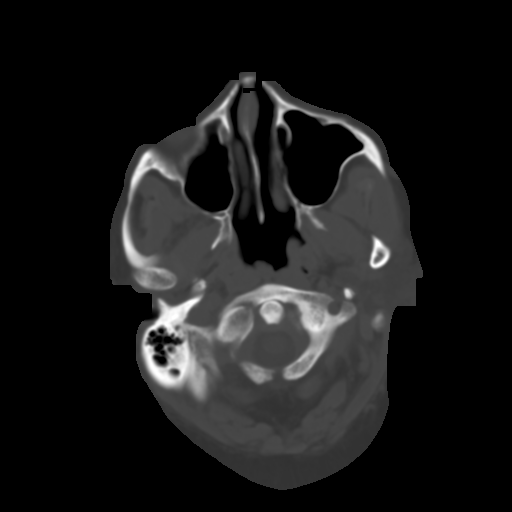
[im 8/31  brain]
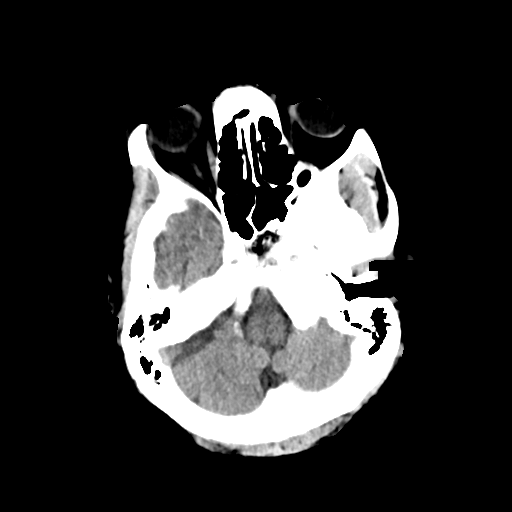
[im 12/31  brain]
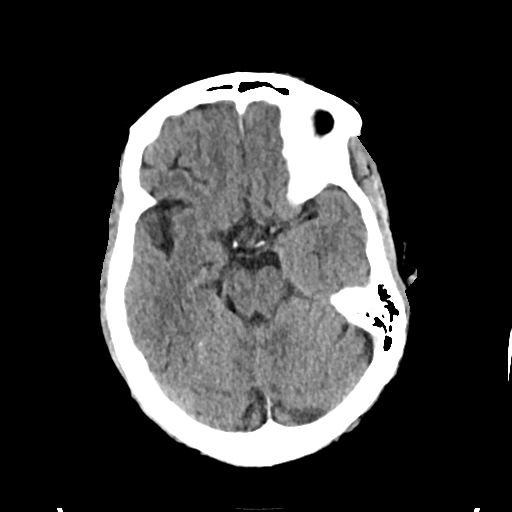
[im 16/31  brain]
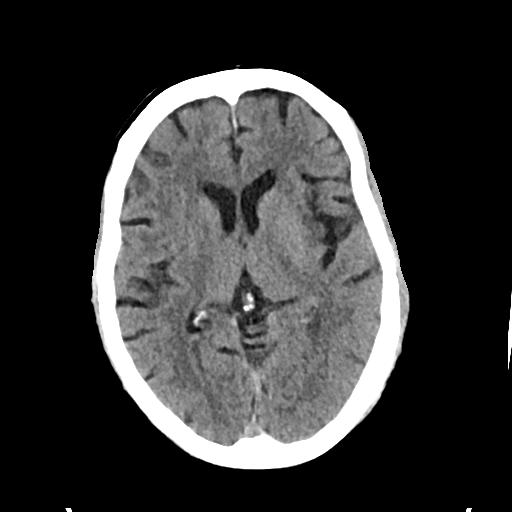
[im 19/31  brain]
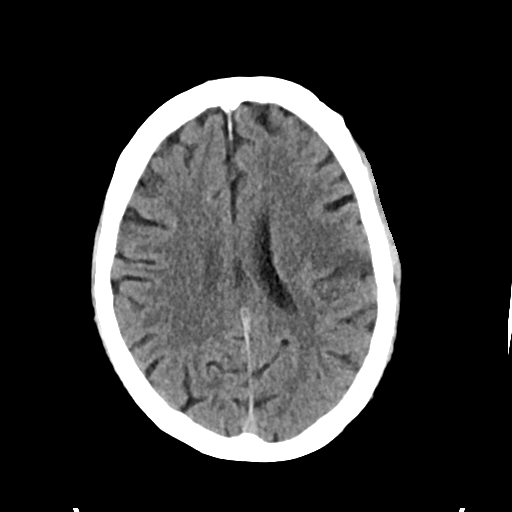
[im 19/31  bone]
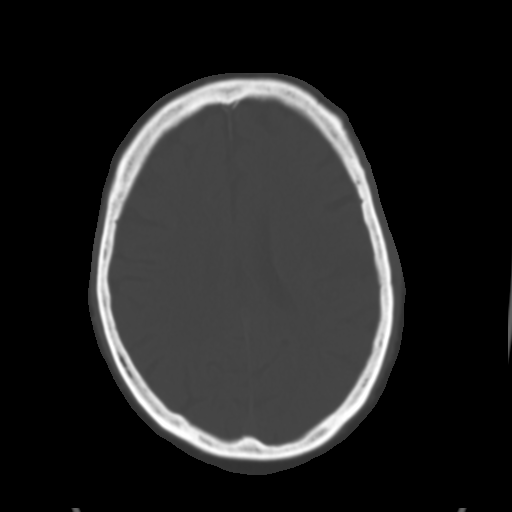
[im 23/31  brain]
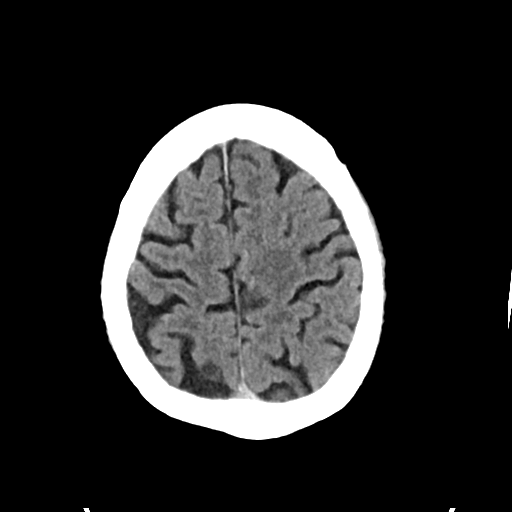
[im 27/31  brain]
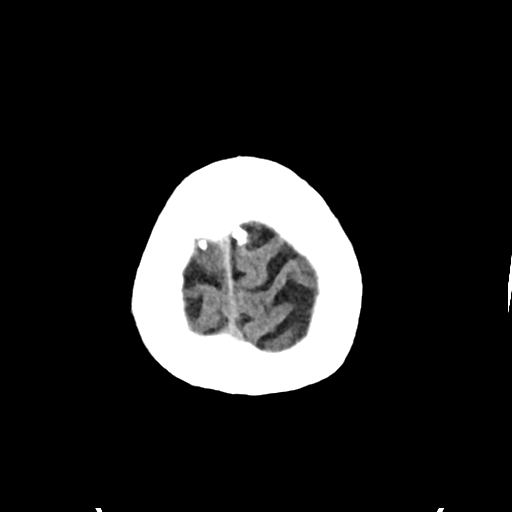

[Series 4: head bone · axial · 0.45mm/px · z∈[+1198,+1214]mm · 2 of 76 slices shown]
[im 8/76  bone]
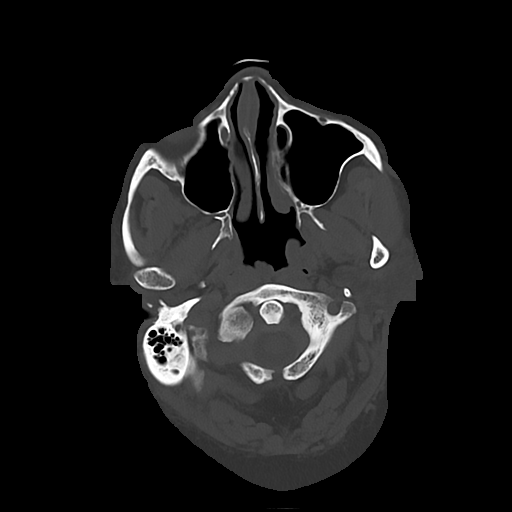
[im 16/76  bone]
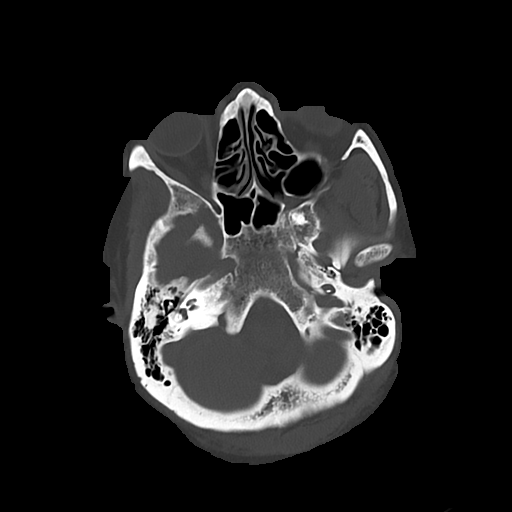

[Series 5: cor soft · coronal · 0.33mm/px · 3 of 83 slices shown]
[im 28/83  brain]
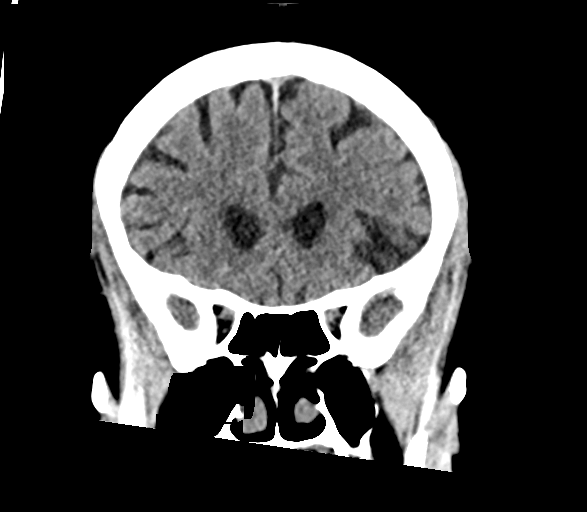
[im 37/83  brain]
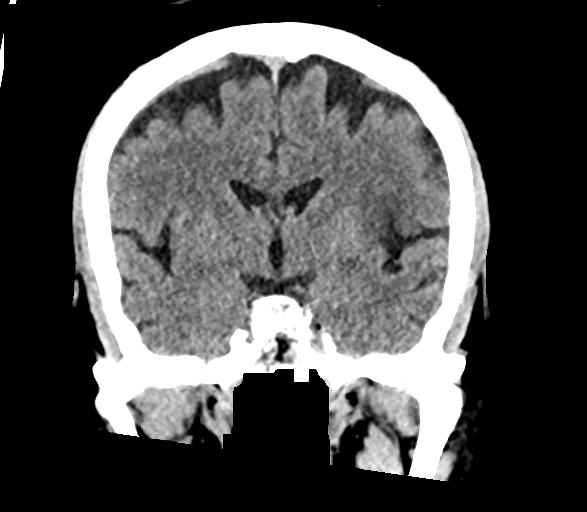
[im 46/83  brain]
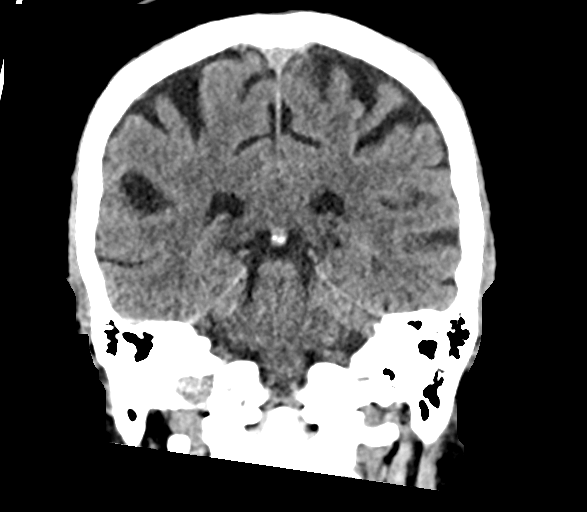

[Series 6: sag soft · sagittal · 0.33mm/px · 3 of 66 slices shown]
[im 26/66  brain]
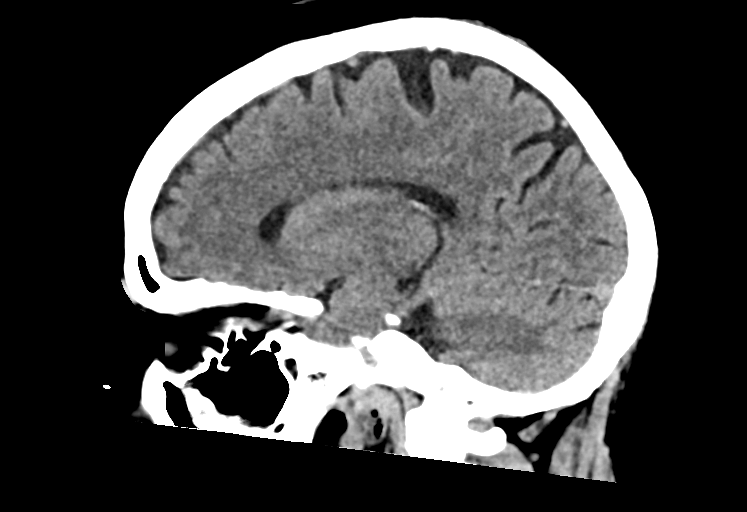
[im 33/66  brain]
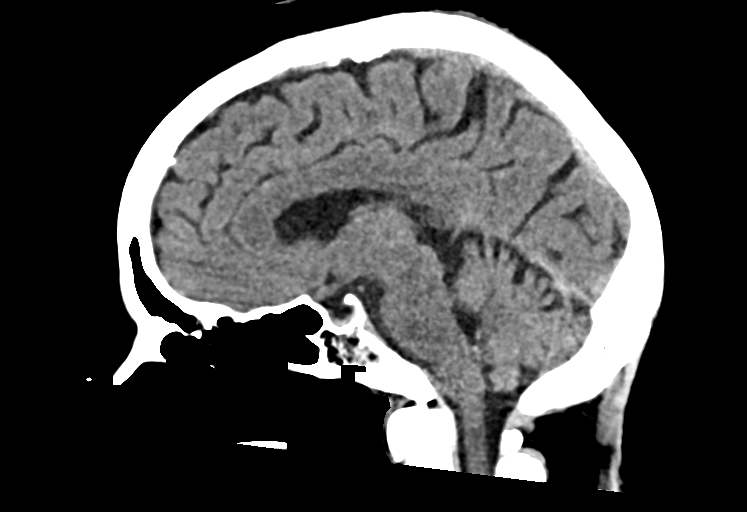
[im 41/66  brain]
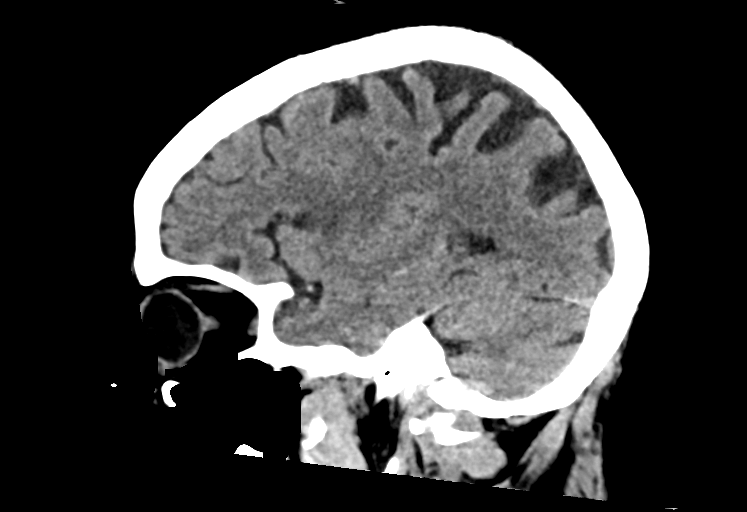

[15 of 47 positions shown; findings below may reference images not displayed]

FINDINGS: Brain: New area of hypodensity in the left insula and left operculum
compatible with acute infarct not seen on prior CT or MRI.
Additional small areas of acute infarct in the left frontal and
parietal cortex compatible with acute infarction best seen on MRI.

No acute hemorrhage or mass.  Ventricle size normal.

Vascular: Possible hyperdensity left M2 branch which is more
apparent on the current study and may be new.

Skull: Negative

Sinuses/Orbits: Paranasal sinuses clear.  Negative orbit

Other: None
IMPRESSION: Interval progression of infarct now vomiting the left insula and
operculum not seen on the prior MRI yesterday. No associated
hemorrhage. In addition, there appears to be possible hyperdensity
in the left MCA branch in the sylvian fissure which could be due to
new thrombus.

ADDENDUM:
Code stroke imaging results were communicated on [DATE] at [DATE] to provider JIM via secure text paging.

*** End of Addendum ***
FINDINGS: Brain: New area of hypodensity in the left insula and left operculum
compatible with acute infarct not seen on prior CT or MRI.
Additional small areas of acute infarct in the left frontal and
parietal cortex compatible with acute infarction best seen on MRI.

No acute hemorrhage or mass.  Ventricle size normal.

Vascular: Possible hyperdensity left M2 branch which is more
apparent on the current study and may be new.

Skull: Negative

Sinuses/Orbits: Paranasal sinuses clear.  Negative orbit

Other: None
IMPRESSION: Interval progression of infarct now vomiting the left insula and
operculum not seen on the prior MRI yesterday. No associated
hemorrhage. In addition, there appears to be possible hyperdensity
in the left MCA branch in the sylvian fissure which could be due to
new thrombus.

## 2020-07-04 IMAGING — XA IR INTRACRANIAL STENT (INCL PTA)
1 series · 9 of 24 positions shown · non-contrast
Comparison: CT angiogram of the head and neck [DATE], and CT of the brain [DATE].

INDICATION: Worsening right-sided weakness, slurred speech, aphasia. Occluded
left internal carotid artery proximally, and left middle cerebral
artery M1 segment on CT angiogram of the head and neck.

EXAM:
1. EMERGENT LARGE VESSEL OCCLUSION THROMBOLYSIS (anterior
CIRCULATION)
TECHNIQUE: Following a full explanation of the procedure along with the
potential associated complications, an informed witnessed consent
was obtained. The risks of intracranial hemorrhage of 10%, worsening
neurological deficit, ventilator dependency, death and inability to
revascularize were all reviewed in detail with the patient's .

[Series 300: dr. (person_name) · 9 of 289 slices shown]
[im 13/289]
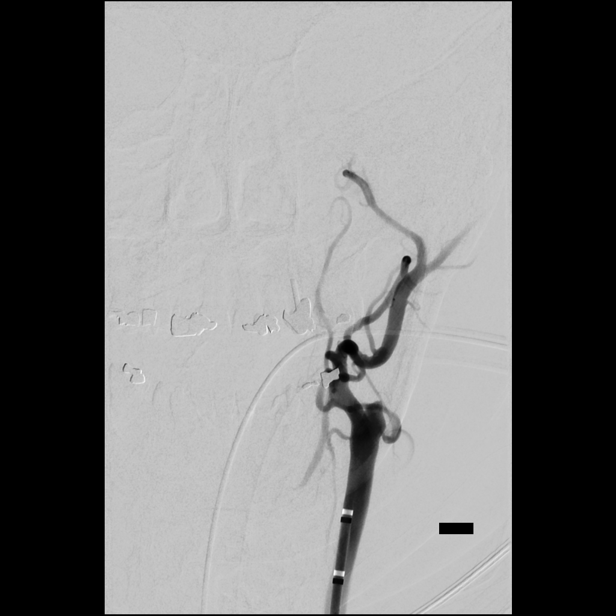
[im 51/289]
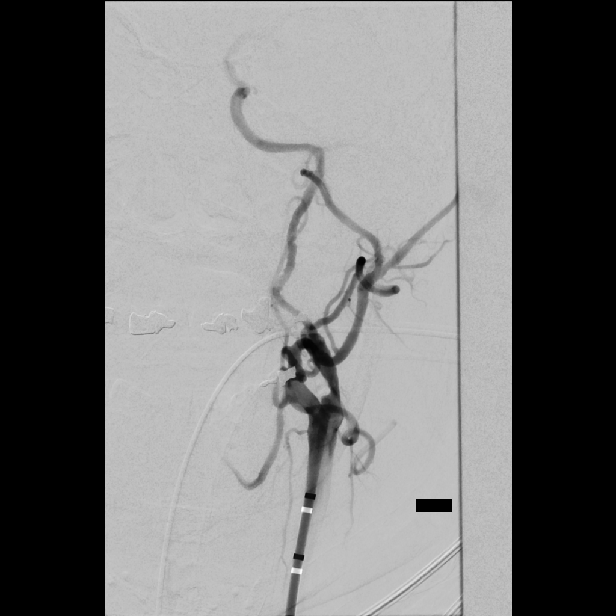
[im 76/289]
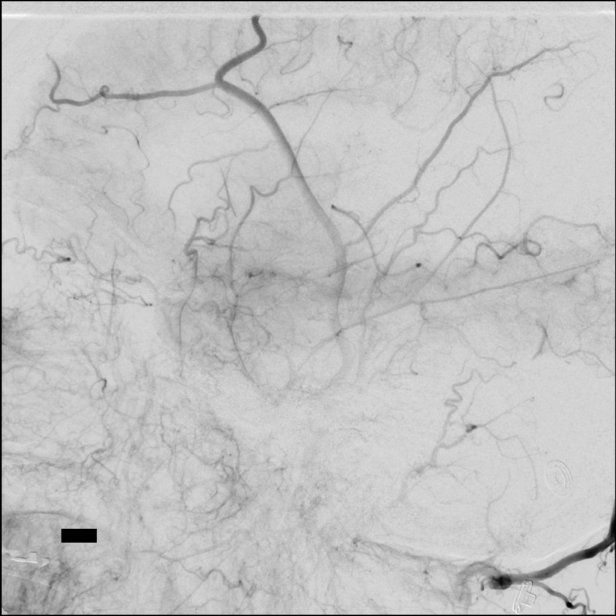
[im 113/289]
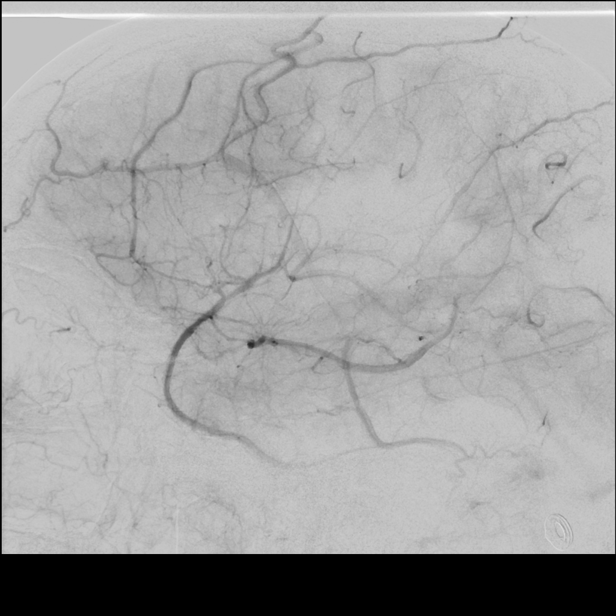
[im 151/289]
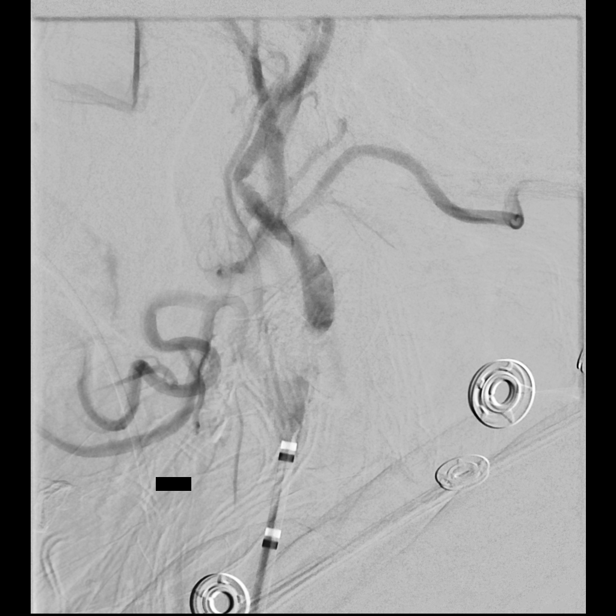
[im 176/289]
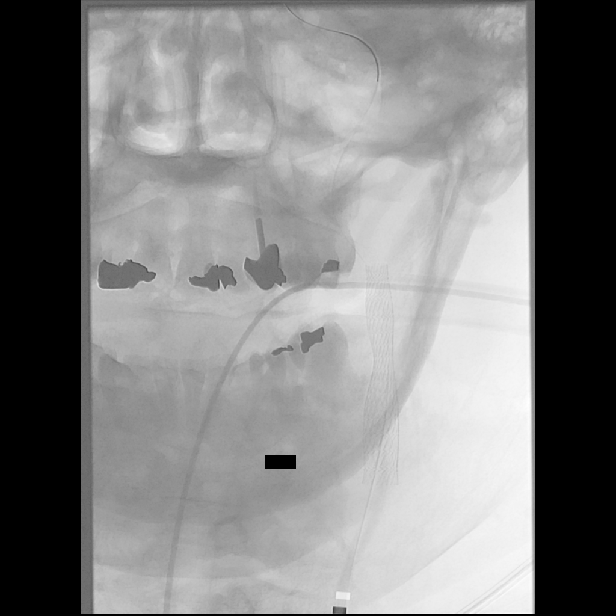
[im 213/289]
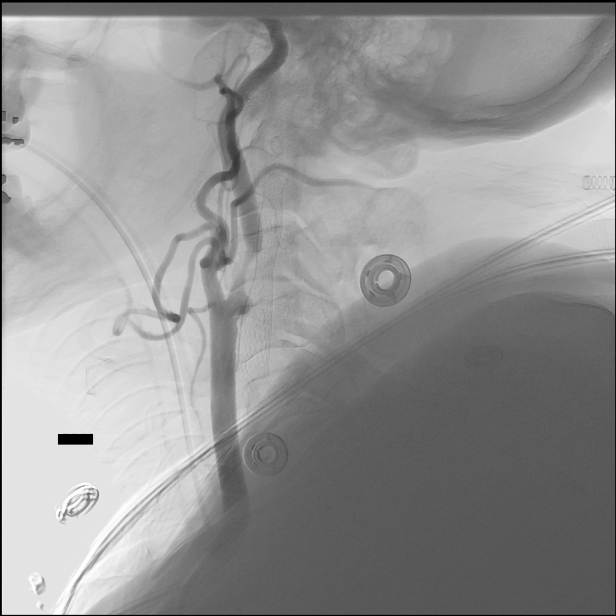
[im 251/289]
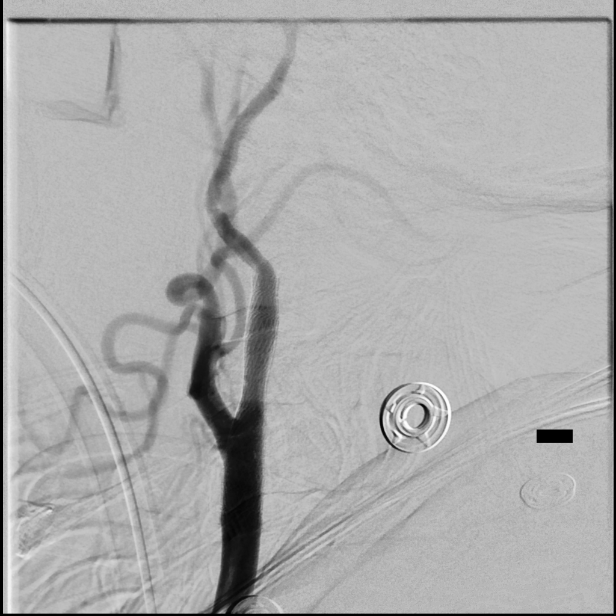
[im 276/289]
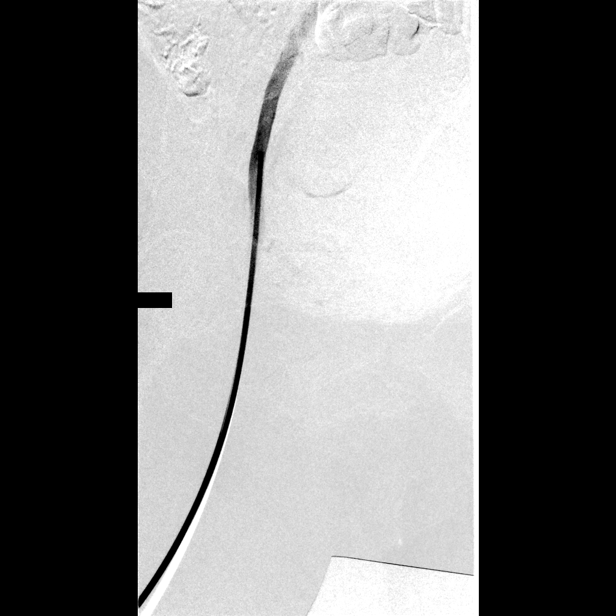

[9 of 24 positions shown; findings below may reference images not displayed]

MEDICATIONS:
Ancef 2 g IV antibiotic was administered within 1 hour of the
procedure.

ANESTHESIA/SEDATION:
General anesthesia

CONTRAST:  Isovue 300 approximately 130 mL

FLUOROSCOPY TIME:  Fluoroscopy Time: 61 minutes 34 seconds ([RI]
mGy).

COMPLICATIONS:
None immediate.
The patient was then put under general anesthesia by the [REDACTED] at [HOSPITAL].

The right groin was prepped and draped in the usual sterile fashion.
Thereafter using modified Seldinger technique, transfemoral access
into the right common femoral artery was obtained without
difficulty. Over a 0.035 inch guidewire an 8 French 25 cm Pinnacle
sheath was inserted. Through this, and also over a 0.035 inch
guidewire a 5 ROUDELINE 2 select catheter inside of an 087
balloon guide catheter combination was advanced to the aortic arch
region and selectively positioned in the distal left common carotid
artery. Guidewire, and the select diagnostic catheter were removed.
Good aspiration obtained from the hub of the balloon guide catheter
in the distal left common carotid artery.
FINDINGS: An arteriogram performed through this continued to demonstrate
occluded left internal carotid artery proximally.

Distal reconstitution of the cavernous and supraclinoid left ICA was
demonstrated from the left external carotid artery branches via the
ipsilateral ophthalmic artery.

This demonstrated the left A1 segment proximally to be patent.
Occlusion of the left middle cerebral artery distal to the anterior
temporal branch was noted.

PROCEDURE:
A Trevo ProVue 021 microcatheter was then advanced over a 0.014 inch
standard Synchro micro guidewire with a slight J configuration to
the distal end of the balloon guide catheter positioned at the
orifice of the left internal carotid artery bulb. After using a
torque device, the micro guidewire was gently advanced without
difficulty to the petrous horizontal segment of the left internal
carotid artery followed by the microcatheter. The guidewire was
removed. Good aspiration obtained from the hub of the microcatheter.
A gentle contrast injection demonstrated safe position of the tip of
the microcatheter again confirming occluded left middle cerebral
artery distal to the anterior temporal branch. Micro guidewire was
then replaced with an 014 inch 300 cm standard Synchro exchange
micro guidewire with a J configuration.

A control arteriogram performed through the balloon guide in the
left common carotid artery demonstrated a severe stenosis just
distal to the bulb secondary to a circumferential atherosclerotic
plaque.

A 4 mm x 30 mm Viatrac 14 angioplasty balloon catheter was then
prepped and purged with heparinized saline infusion proximally and
distally.

Using the rapid exchange technique, this was then advanced to the
site of the severe stenosis and placed with its markers adequate
distant to the site of the severe stenosis.

Slow control inflation was then performed using micro inflation
syringe device via micro tubing to a normal diameter of 4 mm where
it was maintained for approximately 30 seconds. No evidence of
bradycardia was noted on the balloon inflation.

The balloon was then retrieved and removed. A control arteriogram
performed through the balloon guide now demonstrated excellent
significantly improved flow through the angioplastied segment. More
distally moderate spasm was seen in the mid cervical left ICA which
responded to 25 mcg of nitroglycerin.

More distally the left anterior cerebral artery appeared widely
patent.

The left middle cerebral artery M1 segment occlusion was again
noted.

Over the exchange micro guidewire, a combination of a Trevo ProVue
microcatheter and a 134 cm 6 French Catalyst intermediary catheter
was advanced into the proximal cavernous segment.

The exchange micro guidewire was removed. This was replaced with a
regular 014 inch standard Synchro micro guidewire with a J-tip
configuration. The combination was then navigated through the
occluded left middle cerebral artery inferior division into the M2
M3 region followed by microcatheter. The guidewire was removed. Good
aspiration obtained from the hub of the microcatheter. This was then
connected to continuous heparinized saline infusion. A 5 mm x 33 mm
Embotrap retrieval device was then advanced to the distal end of the
microcatheter. This was then deployed by retrieving the
microcatheter with the proximal end of the retrieval device engaged
in the proximal aspect of the occlusion. The Catalyst guide catheter
was advanced into the proximal segment of the occluded left middle
cerebral artery. Thereafter proximal flow arrest in the left
internal carotid artery with constant aspiration with a 60 mL
syringe at the hub of the balloon guide catheter, and constant
aspiration at the hub of the Catalyst aspiration catheter over 2-1/2
minutes, the combination of the retrieval device, and the
microcatheter were retrieved into the 6 French Catalyst guide
catheter and the combination was retrieved and removed. Small bits
of clot were seen entangled in the interstices of the retrieval
device.

A control arteriogram performed through the balloon guide catheter
with reversal of proximal flow arrest on the left in the internal
carotid artery demonstrated complete revascularization of the left
internal carotid artery left anterior cerebral artery.

The left middle cerebral artery demonstrated significantly improved
revascularization to a TICI 2c revascularization. There continued to
be an occluded mid perisylvian M2 branch of the superior division.
This prompted the advancement of a 35 160 aspiration catheter over a
0.014 inch micro guidewire to the distal end of the left internal
carotid artery.

The micro guidewire was then gently advanced without difficulty
through the occluded superior division M2 segment into the M2 M3
region followed by the advancement of the 35 aspiration catheter as
the micro guidewire was retrieved and removed.

The aspiration catheter was connected to continuous aspiration to
the Penumbra pump for approximately 2-1/2 minutes. Thereafter, the
aspiration catheter was withdrawn completely and removed. Copious
amount of thin clot was seen within the aspiration catheter. A
control arteriogram performed through the balloon guide catheter in
the left internal carotid artery demonstrated complete
revascularization of the occluded M2 superior division. A near TICI
3 revascularization was achieved.

An 021 microcatheter was again advanced over a 0.014 inch standard
Synchro micro guidewire to the horizontal petrous segment of the
left internal carotid artery. This was then exchanged for the 014
inch 300 cm standard Synchro exchange micro guidewire with a J-tip
configuration.

Measurements were then performed of the left internal carotid artery
in its most normal segment distal to the angioplastied segment, and
of the distal left common carotid artery.

It was decided to proceed with placement of a 6/8 mm x 40 mm Xact
stent. The delivery catheter with the stent was then retrogradely
purged with heparinized saline infusion. Again using the rapid
exchange technique this was advanced and positioned adequate distant
to the site of the angioplasty in the left internal carotid artery
and the left common carotid artery. The stent was then deployed in
the usual manner without any difficulty. With the exchange wire
maintained distally in the petrous segment, the delivery apparatus
was retrieved and removed. A control arteriogram performed through
the balloon guide immediately and at 20 and 30 minutes post
deployment of the stent continued to demonstrate excellent flow
through the stented segment also intracranially with maintenance of
a TICI 3 revascularization.

The left anterior cerebral artery territory remained widely patent.

During the procedure, the patient's blood pressure and neurological
status remained stable. No evidence of extravasation or mass-effect
was seen.

CT of the brain performed demonstrated no mass effect or midline
shift. There, however, was evidence of mild to moderate contrast
overlying the anterior parietal convexity, and the posterior
peri-insular area. Hemostasis in the right groin puncture was
obtained with an 8 French Angio-Seal closure device. The right groin
appeared soft. Distal pulses remained Dopplerable bilaterally in
both feet unchanged.

The patient was left intubated on account of the patient's medical
condition to protect airway.

Patient was then transferred to the neuro ICU for post
revascularization care.
IMPRESSION: Status post endovascular complete revascularization of occluded left
middle cerebral artery M1 branch with 1 pass with the 5 mm x 33 mm
Embotrap retrieval device and aspiration, and 1 pass with aspiration
achieving a TICI 3 revascularization.

Status post endovascular revascularization of symptomatic acute
occlusion of the left internal carotid proximally with stent
assisted angioplasty.

PLAN:
Follow-up in the clinic 4-6 weeks post discharge.

## 2020-07-04 SURGERY — IR WITH ANESTHESIA
Anesthesia: General

## 2020-07-04 MED ORDER — TICAGRELOR 90 MG PO TABS
90.0000 mg | ORAL_TABLET | Freq: Two times a day (BID) | ORAL | Status: DC
  Administered 2020-07-04: 90 mg via ORAL
  Filled 2020-07-04: qty 1

## 2020-07-04 MED ORDER — PERFLUTREN LIPID MICROSPHERE
INTRAVENOUS | Status: AC
  Filled 2020-07-04: qty 10

## 2020-07-04 MED ORDER — SODIUM CHLORIDE 0.9 % IV SOLN: INTRAVENOUS | Status: DC | PRN

## 2020-07-04 MED ORDER — DEXAMETHASONE SODIUM PHOSPHATE 10 MG/ML IJ SOLN
INTRAMUSCULAR | Status: DC | PRN
  Administered 2020-07-04: 5 mg via INTRAVENOUS

## 2020-07-04 MED ORDER — ATORVASTATIN CALCIUM 80 MG PO TABS
80.0000 mg | ORAL_TABLET | Freq: Every day | ORAL | Status: DC
  Administered 2020-07-04: 80 mg

## 2020-07-04 MED ORDER — IOHEXOL 300 MG/ML  SOLN
150.0000 mL | Freq: Once | INTRAMUSCULAR | Status: AC | PRN
  Administered 2020-07-04: 100 mL via INTRA_ARTERIAL

## 2020-07-04 MED ORDER — PHENYLEPHRINE HCL-NACL 10-0.9 MG/250ML-% IV SOLN: 0.0000 ug/min | INTRAVENOUS | Status: DC

## 2020-07-04 MED ORDER — PERFLUTREN LIPID MICROSPHERE
1.0000 mL | INTRAVENOUS | Status: AC | PRN
  Administered 2020-07-04: 3 mL via INTRAVENOUS
  Filled 2020-07-04: qty 10

## 2020-07-04 MED ORDER — MIDAZOLAM HCL 2 MG/2ML IJ SOLN: 1.0000 mg | INTRAMUSCULAR | Status: DC | PRN

## 2020-07-04 MED ORDER — ORAL CARE MOUTH RINSE
15.0000 mL | OROMUCOSAL | Status: DC
  Administered 2020-07-04 – 2020-07-05 (×8): 15 mL via OROMUCOSAL

## 2020-07-04 MED ORDER — DOCUSATE SODIUM 50 MG/5ML PO LIQD: 100.0000 mg | Freq: Two times a day (BID) | ORAL | Status: DC

## 2020-07-04 MED ORDER — TICAGRELOR 90 MG PO TABS
90.0000 mg | ORAL_TABLET | Freq: Two times a day (BID) | ORAL | Status: DC
  Administered 2020-07-04 – 2020-07-05 (×2): 90 mg
  Filled 2020-07-04 (×2): qty 1

## 2020-07-04 MED ORDER — PHENYLEPHRINE HCL-NACL 10-0.9 MG/250ML-% IV SOLN
INTRAVENOUS | Status: DC | PRN
  Administered 2020-07-04: 40 ug/min via INTRAVENOUS
  Administered 2020-07-04: 20 ug/min via INTRAVENOUS

## 2020-07-04 MED ORDER — ACETAMINOPHEN 650 MG RE SUPP: 650.0000 mg | RECTAL | Status: DC | PRN

## 2020-07-04 MED ORDER — SODIUM CHLORIDE 0.9 % IV SOLN: INTRAVENOUS | Status: DC

## 2020-07-04 MED ORDER — PANTOPRAZOLE SODIUM 40 MG IV SOLR
40.0000 mg | Freq: Every day | INTRAVENOUS | Status: DC
  Administered 2020-07-04 – 2020-07-05 (×2): 40 mg via INTRAVENOUS
  Filled 2020-07-04 (×2): qty 40

## 2020-07-04 MED ORDER — ATORVASTATIN CALCIUM 80 MG PO TABS
80.0000 mg | ORAL_TABLET | Freq: Every day | ORAL | Status: DC
  Filled 2020-07-04: qty 1

## 2020-07-04 MED ORDER — ROCURONIUM BROMIDE 10 MG/ML (PF) SYRINGE
PREFILLED_SYRINGE | INTRAVENOUS | Status: DC | PRN
  Administered 2020-07-04 (×2): 20 mg via INTRAVENOUS
  Administered 2020-07-04: 50 mg via INTRAVENOUS

## 2020-07-04 MED ORDER — ACETAMINOPHEN 160 MG/5ML PO SOLN: 650.0000 mg | ORAL | Status: DC | PRN

## 2020-07-04 MED ORDER — PHENYLEPHRINE HCL-NACL 10-0.9 MG/250ML-% IV SOLN
0.0000 ug/min | INTRAVENOUS | Status: DC
  Administered 2020-07-04 – 2020-07-05 (×2): 20 ug/min via INTRAVENOUS
  Administered 2020-07-05: 30 ug/min via INTRAVENOUS
  Filled 2020-07-04 (×2): qty 250

## 2020-07-04 MED ORDER — ONDANSETRON HCL 4 MG/2ML IJ SOLN
INTRAMUSCULAR | Status: DC | PRN
  Administered 2020-07-04: 4 mg via INTRAVENOUS

## 2020-07-04 MED ORDER — PROPOFOL 500 MG/50ML IV EMUL
INTRAVENOUS | Status: DC | PRN
  Administered 2020-07-04: 50 ug/kg/min via INTRAVENOUS

## 2020-07-04 MED ORDER — ORAL CARE MOUTH RINSE: 15.0000 mL | Freq: Two times a day (BID) | OROMUCOSAL | Status: DC

## 2020-07-04 MED ORDER — GLYCOPYRROLATE 0.2 MG/ML IJ SOLN
INTRAMUSCULAR | Status: DC | PRN
  Administered 2020-07-04: .2 mg via INTRAVENOUS

## 2020-07-04 MED ORDER — FENTANYL CITRATE (PF) 100 MCG/2ML IJ SOLN
INTRAMUSCULAR | Status: DC | PRN
Start: 2020-07-04 — End: 2020-07-04
  Administered 2020-07-04: 100 ug via INTRAVENOUS

## 2020-07-04 MED ORDER — CEFAZOLIN SODIUM-DEXTROSE 2-3 GM-%(50ML) IV SOLR
INTRAVENOUS | Status: DC | PRN
  Administered 2020-07-04: 2 g via INTRAVENOUS

## 2020-07-04 MED ORDER — PROPOFOL 1000 MG/100ML IV EMUL
0.0000 ug/kg/min | INTRAVENOUS | Status: DC
  Administered 2020-07-04: 40 ug/kg/min via INTRAVENOUS
  Administered 2020-07-04: 50 ug/kg/min via INTRAVENOUS
  Administered 2020-07-05: 40 ug/kg/min via INTRAVENOUS
  Filled 2020-07-04 (×3): qty 100

## 2020-07-04 MED ORDER — FENTANYL CITRATE (PF) 100 MCG/2ML IJ SOLN
25.0000 ug | INTRAMUSCULAR | Status: DC | PRN
  Administered 2020-07-05: 100 ug via INTRAVENOUS
  Filled 2020-07-04: qty 2

## 2020-07-04 MED ORDER — SODIUM CHLORIDE (PF) 0.9 % IJ SOLN
INTRAVENOUS | Status: AC | PRN
  Administered 2020-07-04 (×4): 25 ug via INTRA_ARTERIAL

## 2020-07-04 MED ORDER — SUGAMMADEX SODIUM 200 MG/2ML IV SOLN
INTRAVENOUS | Status: DC | PRN
  Administered 2020-07-04: 200 mg via INTRAVENOUS

## 2020-07-04 MED ORDER — CHLORHEXIDINE GLUCONATE 0.12% ORAL RINSE (MEDLINE KIT)
15.0000 mL | Freq: Two times a day (BID) | OROMUCOSAL | Status: DC
  Administered 2020-07-04 – 2020-07-05 (×2): 15 mL via OROMUCOSAL

## 2020-07-04 MED ORDER — ASPIRIN 81 MG PO CHEW
81.0000 mg | CHEWABLE_TABLET | Freq: Every day | ORAL | Status: DC
  Administered 2020-07-06 – 2020-07-11 (×6): 81 mg via ORAL
  Filled 2020-07-04 (×6): qty 1

## 2020-07-04 MED ORDER — POLYETHYLENE GLYCOL 3350 17 G PO PACK
17.0000 g | PACK | Freq: Every day | ORAL | Status: DC
  Filled 2020-07-04: qty 1

## 2020-07-04 MED ORDER — CLEVIDIPINE BUTYRATE 0.5 MG/ML IV EMUL
0.0000 mg/h | INTRAVENOUS | Status: AC
  Filled 2020-07-04: qty 50

## 2020-07-04 MED ORDER — ALBUMIN HUMAN 5 % IV SOLN: INTRAVENOUS | Status: DC | PRN

## 2020-07-04 MED ORDER — ACETAMINOPHEN 325 MG PO TABS
650.0000 mg | ORAL_TABLET | ORAL | Status: DC | PRN
  Administered 2020-07-07 – 2020-07-10 (×3): 650 mg via ORAL
  Filled 2020-07-04 (×3): qty 2

## 2020-07-04 MED ORDER — TICAGRELOR 90 MG PO TABS
90.0000 mg | ORAL_TABLET | Freq: Two times a day (BID) | ORAL | Status: DC
  Administered 2020-07-05 – 2020-07-11 (×12): 90 mg via ORAL
  Filled 2020-07-04 (×12): qty 1

## 2020-07-04 MED ORDER — PHENYLEPHRINE 40 MCG/ML (10ML) SYRINGE FOR IV PUSH (FOR BLOOD PRESSURE SUPPORT)
PREFILLED_SYRINGE | INTRAVENOUS | Status: DC | PRN
  Administered 2020-07-04 (×5): 80 ug via INTRAVENOUS

## 2020-07-04 MED ORDER — CEFAZOLIN SODIUM-DEXTROSE 2-4 GM/100ML-% IV SOLN
INTRAVENOUS | Status: AC
  Filled 2020-07-04: qty 100

## 2020-07-04 MED ORDER — CHLORHEXIDINE GLUCONATE 0.12 % MT SOLN: 15.0000 mL | Freq: Once | OROMUCOSAL | Status: DC

## 2020-07-04 MED ORDER — EPTIFIBATIDE 20 MG/10ML IV SOLN
INTRAVENOUS | Status: AC
  Filled 2020-07-04: qty 10

## 2020-07-04 MED ORDER — FENTANYL CITRATE (PF) 100 MCG/2ML IJ SOLN: 25.0000 ug | INTRAMUSCULAR | Status: DC | PRN

## 2020-07-04 MED ORDER — EPHEDRINE SULFATE 50 MG/ML IJ SOLN
INTRAMUSCULAR | Status: DC | PRN
  Administered 2020-07-04 (×3): 5 mg via INTRAVENOUS
  Administered 2020-07-04: 10 mg via INTRAVENOUS
  Administered 2020-07-04: 5 mg via INTRAVENOUS

## 2020-07-04 MED ORDER — PROPOFOL 10 MG/ML IV BOLUS
INTRAVENOUS | Status: DC | PRN
  Administered 2020-07-04: 100 mg via INTRAVENOUS

## 2020-07-04 MED ORDER — EPTIFIBATIDE 20 MG/10ML IV SOLN
INTRAVENOUS | Status: AC | PRN
  Administered 2020-07-04 (×3): 1.5 mg via INTRAVENOUS

## 2020-07-04 MED ORDER — ASPIRIN 81 MG PO CHEW
81.0000 mg | CHEWABLE_TABLET | Freq: Every day | ORAL | Status: DC
  Administered 2020-07-05: 81 mg
  Filled 2020-07-04: qty 1

## 2020-07-04 MED ORDER — SODIUM CHLORIDE 0.9 % IV SOLN: 250.0000 mL | INTRAVENOUS | Status: DC

## 2020-07-04 MED ORDER — IOHEXOL 300 MG/ML  SOLN
50.0000 mL | Freq: Once | INTRAMUSCULAR | Status: AC | PRN
  Administered 2020-07-04: 25 mL via INTRA_ARTERIAL

## 2020-07-04 MED ORDER — PHENYLEPHRINE HCL-NACL 10-0.9 MG/250ML-% IV SOLN
INTRAVENOUS | Status: AC
  Filled 2020-07-04: qty 250

## 2020-07-04 MED ORDER — CLEVIDIPINE BUTYRATE 0.5 MG/ML IV EMUL
INTRAVENOUS | Status: DC | PRN
  Administered 2020-07-04: 6 mg/h via INTRAVENOUS

## 2020-07-04 MED ORDER — CLEVIDIPINE BUTYRATE 0.5 MG/ML IV EMUL
INTRAVENOUS | Status: AC
  Administered 2020-07-05: 1 mg/h via INTRAVENOUS
  Filled 2020-07-04: qty 50

## 2020-07-04 MED ORDER — IOHEXOL 300 MG/ML  SOLN
100.0000 mL | Freq: Once | INTRAMUSCULAR | Status: AC | PRN
  Administered 2020-07-04: 25 mL via INTRA_ARTERIAL

## 2020-07-04 MED ORDER — NITROGLYCERIN 1 MG/10 ML FOR IR/CATH LAB
INTRA_ARTERIAL | Status: AC
  Filled 2020-07-04: qty 10

## 2020-07-04 MED ORDER — LIDOCAINE 2% (20 MG/ML) 5 ML SYRINGE
INTRAMUSCULAR | Status: DC | PRN
  Administered 2020-07-04: 50 mg via INTRAVENOUS

## 2020-07-04 MED ORDER — HEPARIN SODIUM (PORCINE) 1000 UNIT/ML IJ SOLN
INTRAMUSCULAR | Status: DC | PRN
  Administered 2020-07-04: 1000 [IU] via INTRAVENOUS

## 2020-07-04 NOTE — Progress Notes (Signed)
1150 pt was transported to CT then to short stay #34.

## 2020-07-04 NOTE — Progress Notes (Signed)
Pt's NIH on arrival to unit is 4 (right facial droop, RUE/RLE drift, and dysarthria) different than previously charted score of 0. Notified MD, no new orders. Clarified swallow screen order, instructed to perform screen and administer ordered medications, then keep NPO for remainder of night. Pt failed initial screen, passed 2nd attempt. However, while administering meds pt unable to safely swallow, coughing and spitting out water/pill. Only 90mg  of ordered 180mg  of Brilinta given, MD aware.

## 2020-07-04 NOTE — Sedation Documentation (Addendum)
embotrap deployed M2 by Dr Corliss Skains

## 2020-07-04 NOTE — Anesthesia Preprocedure Evaluation (Addendum)
Anesthesia Evaluation  Patient identified by MRN, date of birth, ID band Patient awake    Reviewed: Allergy & Precautions, H&P , NPO status , Patient's Chart, lab work & pertinent test results, reviewed documented beta blocker date and time   Airway Mallampati: II   Neck ROM: full    Dental  (+) Teeth Intact, Caps, Dental Advisory Given   Pulmonary neg pulmonary ROS,    breath sounds clear to auscultation       Cardiovascular hypertension, Pt. on medications + Peripheral Vascular Disease   Rhythm:regular Rate:Normal     Neuro/Psych Right hemiparesis, dysarthria, dysphagia, right facial droop CVA, Residual Symptoms negative psych ROS   GI/Hepatic negative GI ROS, Neg liver ROS,   Endo/Other  diabetes, Well Controlled, Type 2  Renal/GU   negative genitourinary   Musculoskeletal negative musculoskeletal ROS (+)   Abdominal Normal abdominal exam  (+)   Peds  Hematology negative hematology ROS (+)   Anesthesia Other Findings   Reproductive/Obstetrics                           Anesthesia Physical Anesthesia Plan  ASA: III  Anesthesia Plan: General   Post-op Pain Management:    Induction: Intravenous  PONV Risk Score and Plan: 3 and Ondansetron, Dexamethasone and Treatment may vary due to age or medical condition  Airway Management Planned: Oral ETT  Additional Equipment: Arterial line  Intra-op Plan:   Post-operative Plan: Extubation in OR  Informed Consent: I have reviewed the patients History and Physical, chart, labs and discussed the procedure including the risks, benefits and alternatives for the proposed anesthesia with the patient or authorized representative who has indicated his/her understanding and acceptance.       Plan Discussed with: CRNA, Anesthesiologist and Surgeon  Anesthesia Plan Comments:         Anesthesia Quick Evaluation

## 2020-07-04 NOTE — Anesthesia Procedure Notes (Signed)
Procedure Name: Intubation Date/Time: 07/04/2020 3:10 PM Performed by: Elliot Dally, CRNA Pre-anesthesia Checklist: Patient identified, Emergency Drugs available, Suction available and Patient being monitored Patient Re-evaluated:Patient Re-evaluated prior to induction Oxygen Delivery Method: Circle System Utilized Preoxygenation: Pre-oxygenation with 100% oxygen Induction Type: IV induction Ventilation: Mask ventilation without difficulty Laryngoscope Size: Miller and 2 Grade View: Grade I Tube type: Oral Tube size: 7.0 mm Number of attempts: 1 Airway Equipment and Method: Stylet and Oral airway Placement Confirmation: ETT inserted through vocal cords under direct vision,  positive ETCO2 and breath sounds checked- equal and bilateral Secured at: 21 cm Tube secured with: Tape Dental Injury: Teeth and Oropharynx as per pre-operative assessment

## 2020-07-04 NOTE — Consult Note (Signed)
NAME:  Sharon Gilbert, MRN:  [id number], DOB:  10/28/1940, LOS: 1 ADMISSION DATE:  07/03/2020, CONSULTATION DATE:  07/04/20 REFERRING MD:  Delmer Islam , CHIEF COMPLAINT:  CVA, mechanical ventilation  Brief History   79 yo L ICA CVA s/p revascularization 9/29. Remains intubated, PCCM consulted for vent and BP  History of present illness   79 yo F admitted 9/28 as code stroke. Pt son noticed slurred speech and mild facial droop 9/28, and called EMS. Last known normal 07/03/20 1500. Stroke workup revealed L ICA occlusion with probably mid M2 occlusion, as well as near-occlusive stenosis of proximal R ICA. Tpa not given and patient not immediate candidate for NIR intervention due to low NIHSS and tandem L ICA and M2 occlusion. NIR recommended close monitoring, and possible intervention following morning or if clinical change occurs 9/29 Pt with R sided weakness. CT H reveals progression of infarct, patient to [facility] for revascularization of L ICA  Remains intubated after NIR. PCCM consulted for vent, BP management    Past Medical History   Pre diabetes Cataracts   Significant Hospital Events   9/28 admitted to stroke service with L ICA occlusion 9/29 L ICA revascularization with NIR, remains intubated after   Consults:  IR PCCM  Procedures:  L ICA revascularization 9/29 ETT 9/29>   Significant Diagnostic Tests:  CT H> interval progression of L MCA territory infarct. No acute hemorrhage or mass Possible hyperdensity L M2 branch   Micro Data:  SARS Cov2> neg   Antimicrobials:    Interim history/subjective:  Remains intubated after NIR   Objective   Blood pressure (!) 174/72, pulse 73, temperature 98.2 F (36.8 C), resp. rate 10, height 5\' 5"  (1.651 m), weight 80.5 kg, SpO2 98 %.        Intake/Output Summary (Last 24 hours) at 07/04/2020 1755 Last data filed at 07/04/2020 1726 Gross per 24 hour  Intake 4896.58 ml  Output 3500 ml  Net 1396.58 ml   Filed Weights    07/03/20 1800 07/04/20 1245  Weight: 80.5 kg 80.5 kg    Examination: General: Chronically ill older adult F, sedated intubated NAD HENT: NCAT anicteric sclera. ETT secure pink mmm Lungs: CTA b, symmetrical chest expansion, mechanically ventilated Cardiovascular: rrr s1s2 no rgm cap refill brisk, 2+ radial pulses Abdomen: soft round ndnt +bowel sounds Extremities: L wrist edema above PIV. No obvious joint deformity. No cyanosis or clubbing.  Neuro: Sedated. PERRL 35mm.  GU: WNL Skin: c/d/w without rash  Resolved Hospital Problem list     Assessment & Plan:   Acute respiratory insufficiency post procedure, requiring MV -not following commands after revasc, left intubated P -VAP, PAD -qAM WUA/SBT -pulm hygiene -CXR, ABG in ICU   L MCA infarction with L ICA occlusion, s/p revascularization of L ICA -s/p NIR 9/29 R ICA near occlusive stenosis  P -BP goal 120-140 -post revascularization care per IR and Neuro -Neuro primary  -anticiapte will likely need R sided intervention as well   Pre-diabetes per A1c Hx of DM? -trend -no SSI at present   Inadequate PO intake -Defer EN unless cannot extubate in AM   Best practice:  Diet: NPO Pain/Anxiety/Delirium protocol (if indicated): prop, PRN fent  VAP protocol (if indicated): yes DVT prophylaxis: lovenox GI prophylaxis: PPI Glucose control: monitor Mobility: BR Code Status: Full Family Communication: per primary Disposition: ICU   Labs   CBC: Recent Labs  Lab 07/03/20 1843 07/03/20 1850 07/03/20 2151  WBC 6.2  --  6.2  NEUTROABS 2.8  --   --   HGB 12.9 13.9 14.4  HCT 39.6 41.0 43.8  MCV 86.1  --  87.8  PLT 299  --  316    Basic Metabolic Panel: Recent Labs  Lab 07/03/20 1843 07/03/20 1850 07/03/20 2151  NA 135 136  --   K 3.4* 3.3*  --   CL 101 103  --   CO2 20*  --   --   GLUCOSE 121* 116*  --   BUN 15 15  --   CREATININE 1.02* 1.00 1.00  CALCIUM 9.4  --   --    GFR: Estimated Creatinine  Clearance: 47 mL/min (by C-G formula based on SCr of 1 mg/dL). Recent Labs  Lab 07/03/20 1843 07/03/20 2151  WBC 6.2 6.2    Liver Function Tests: Recent Labs  Lab 07/03/20 1843  AST 22  ALT 18  ALKPHOS 71  BILITOT 0.7  PROT 7.5  ALBUMIN 3.8   No results for input(s): LIPASE, AMYLASE in the last 168 hours. No results for input(s): AMMONIA in the last 168 hours.  ABG    Component Value Date/Time   TCO2 22 07/03/2020 1850     Coagulation Profile: Recent Labs  Lab 07/03/20 1843  INR 1.1    Cardiac Enzymes: No results for input(s): CKTOTAL, CKMB, CKMBINDEX, TROPONINI in the last 168 hours.  HbA1C: Hgb A1c MFr Bld  Date/Time Value Ref Range Status  07/04/2020 07:38 AM 6.1 (H) 4.8 - 5.6 % Final    Comment:    (NOTE) Pre diabetes:          5.7%-6.4%  Diabetes:              >6.4%  Glycemic control for   <7.0% adults with diabetes     CBG: Recent Labs  Lab 07/03/20 1842  GLUCAP 125*    Review of Systems:   Unable to obtain, intubated and sedated   Past Medical History  She,  has a past medical history of Cataract and Diabetes mellitus without complication (HCC).   Surgical History   History reviewed. No pertinent surgical history.   Social History   reports that she has never smoked. She has never used smokeless tobacco. She reports that she does not drink alcohol and does not use drugs.   Family History   Her family history is not on file.   Allergies No Known Allergies   Home Medications  Prior to Admission medications   Medication Sig Start Date End Date Taking? Authorizing Provider  aspirin EC 81 MG tablet Take 81 mg by mouth daily. 06/07/20   [provider]  dorzolamide-timolol (COSOPT) 22.3-6.8 MG/ML ophthalmic solution  06/26/20   [provider]  fluticasone (FLONASE) 50 MCG/ACT nasal spray Place 2 sprays into both nostrils daily. 11/13/15   Tharon Aquas, PA  hydrOXYzine (ATARAX/VISTARIL) 25 MG tablet Take 0.5 tablets  (12.5 mg total) by mouth every 8 (eight) hours as needed. 01/28/20   Darr, Veryl Speak, PA-C  latanoprost (XALATAN) 0.005 % ophthalmic solution SMARTSIG:1 Drop(s) In Eye(s) Every Evening 06/14/20   [provider]  lisinopril-hydrochlorothiazide (PRINZIDE,ZESTORETIC) 20-12.5 MG tablet TK 1 T PO QD 02/21/17   [provider]  topiramate (TOPAMAX) 25 MG tablet Take 1 tablet (25 mg total) by mouth 2 (two) times daily. 06/27/20 06/27/21  Micki Riley, MD     Critical care time: 40 minutes     Tessie Fass MSN, AGACNP-BC [facility] [phone] If no answer, [phone] 07/04/2020, 6:38 PM

## 2020-07-04 NOTE — Anesthesia Postprocedure Evaluation (Signed)
Anesthesia Post Note  Patient: Sharon Gilbert  Procedure(s) Performed: IR WITH ANESTHESIA (N/A )     Patient location during evaluation: PACU Anesthesia Type: General Level of consciousness: awake and alert Pain management: pain level controlled Vital Signs Assessment: post-procedure vital signs reviewed and stable Respiratory status: spontaneous breathing, nonlabored ventilation, respiratory function stable and patient connected to nasal cannula oxygen Cardiovascular status: blood pressure returned to baseline and stable Postop Assessment: no apparent nausea or vomiting Anesthetic complications: no   No complications documented.  Last Vitals:  Vitals:   07/04/20 2000 07/04/20 2011  BP:    Pulse:    Resp:    Temp: 36.5 C   SpO2:  100%    Last Pain:  Vitals:   07/04/20 2000  TempSrc: Axillary  PainSc:                  Elman Dettman

## 2020-07-04 NOTE — Progress Notes (Signed)
OT Cancellation Note  Patient Details Name: Sharon Gilbert MRN: [id number] DOB: 1941/06/23   Cancelled Treatment:    Reason Eval/Treat Not Completed: Other (comment); pt to go for IR today - will follow up post procedure as pt able to participate in OT eval.  Marcy Siren, OT Acute Rehabilitation Services Pager [phone] Office [phone]   Orlando Penner 07/04/2020, 11:24 AM

## 2020-07-04 NOTE — Progress Notes (Signed)
Patient's other son is coming from [place]. He would like to speak with a doctor on the phone in the morning. There were questions he had that the patient's spokes person, LC her other son, could not answer.  Sharon Gilbert [phone]

## 2020-07-04 NOTE — Progress Notes (Signed)
  Echocardiogram 2D Echocardiogram has been performed.  Patrycja Mumpower G Aviyanna Colbaugh 07/04/2020, 10:07 AM

## 2020-07-04 NOTE — Procedures (Signed)
S/P bilateral common carotid arteriograms followed by complete revascularization of Lt MCA M1 occlusion with x 1 pass wth 31mm x 15mm embotrap retriever and aspiration achieving a TICI 3 revascularization.. S/P revascularization of symptomatic  acute occlusion of Lt ICA prox with stent assisted angioplasty.  Post treatment CT brain no mass effect. Mild to mod SAH (blood+contrast) in the left post  Sylvian fissure and ant parietal convexity. Hemostasis in the RT groin with 46F angioseal closure device. Distal pulses  Dopplerable. Left intubated due to poor response at attempted extubation. Pupils 47mm RT = Lt sluggish. Moved Lt arm and both legs spontaneously though not to command.   D/W Dr Pearlean Brownie MD S.Emy Angevine MD

## 2020-07-04 NOTE — Progress Notes (Signed)
SLP Cancellation Note  Patient Details Name: Sharon Gilbert MRN: [id number] DOB: Oct 22, 1940   Cancelled treatment:        Pt laying flat this am, now in procedure. Will f/u as able, may be tomorrow.    Brach Birdsall, Riley Nearing 07/04/2020, 1:27 PM

## 2020-07-04 NOTE — Consult Note (Signed)
Chief Complaint: Patient was seen in consultation today for image guided cerebral angiogram with possible intervention  Referring Physician(s): Delia Heady  Supervising Physician: Julieanne Cotton  Patient Status: [facility] - In-pt  History of Present Illness: Sharon Gilbert is a 79 y.o. female with a past medical history significant for cataracts and DM who presented to the ED on 07/03/20 with complaints of slurred speech and facial droop. Initial workup showed left ICA occlusion, high grade near occlusive stenosis within the proximal right ICA, occlusion of proximal left M2 MCA branch and calcified plaque within the intracranial right ICA with moderate stenosis within the cavernous segment. Follow up MRI brain significant for multiuple small acute infarcts within the left frontal and parietal lobes within left MCA vascular territory. She was admitted for further evaluation, given ASA and tPA was held. NIR was consulted for possible emergent intervention however she continued to improve during observation and decision was made to postpone procedure until today.   [name] seen in ICU - she is able to answer questions regarding her name and location, she does have some trouble with word finding and slurring of words such as "finger" and "time." She understands that she is going to have a procedure with general anesthesia today to try and open up the arteries in her neck. Procedure was discussed in detail via phone with her son by Dr. Corliss Skains and he is also agreeable to proceed.  Past Medical History:  Diagnosis Date  . Cataract   . Diabetes mellitus without complication (HCC)     No past surgical history on file.  Allergies: Patient has no known allergies.  Medications: Prior to Admission medications   Medication Sig Start Date End Date Taking? Authorizing Provider  aspirin EC 81 MG tablet Take 81 mg by mouth daily. 06/07/20   [provider]  dorzolamide-timolol (COSOPT)  22.3-6.8 MG/ML ophthalmic solution  06/26/20   [provider]  fluticasone (FLONASE) 50 MCG/ACT nasal spray Place 2 sprays into both nostrils daily. 11/13/15   Tharon Aquas, PA  hydrOXYzine (ATARAX/VISTARIL) 25 MG tablet Take 0.5 tablets (12.5 mg total) by mouth every 8 (eight) hours as needed. 01/28/20   Darr, Veryl Speak, PA-C  latanoprost (XALATAN) 0.005 % ophthalmic solution SMARTSIG:1 Drop(s) In Eye(s) Every Evening 06/14/20   [provider]  lisinopril-hydrochlorothiazide (PRINZIDE,ZESTORETIC) 20-12.5 MG tablet TK 1 T PO QD 02/21/17   [provider]  topiramate (TOPAMAX) 25 MG tablet Take 1 tablet (25 mg total) by mouth 2 (two) times daily. 06/27/20 06/27/21  Micki Riley, MD     No family history on file.  Social History   Socioeconomic History  . Marital status: Married    Spouse name: Not on file  . Number of children: Not on file  . Years of education: Not on file  . Highest education level: Not on file  Occupational History  . Not on file  Tobacco Use  . Smoking status: Never Smoker  . Smokeless tobacco: Never Used  Substance and Sexual Activity  . Alcohol use: No  . Drug use: No  . Sexual activity: Yes  Other Topics Concern  . Not on file  Social History Narrative  . Not on file   Social Determinants of Health   Financial Resource Strain:   . Difficulty of Paying Living Expenses: Not on file  Food Insecurity:   . Worried About Programme researcher, broadcasting/film/video in the Last Year: Not on file  . Ran Out of  Food in the Last Year: Not on file  Transportation Needs:   . Lack of Transportation (Medical): Not on file  . Lack of Transportation (Non-Medical): Not on file  Physical Activity:   . Days of Exercise per Week: Not on file  . Minutes of Exercise per Session: Not on file  Stress:   . Feeling of Stress : Not on file  Social Connections:   . Frequency of Communication with Friends and Family: Not on file  . Frequency of Social Gatherings with  Friends and Family: Not on file  . Attends Religious Services: Not on file  . Active Member of Clubs or Organizations: Not on file  . Attends Banker Meetings: Not on file  . Marital Status: Not on file     Review of Systems: A 12 point ROS discussed and pertinent positives are indicated in the HPI above.  All other systems are negative.  Review of Systems  Constitutional: Negative for chills and fever.  Respiratory: Negative for cough and shortness of breath.   Cardiovascular: Negative for chest pain.  Gastrointestinal: Negative for abdominal pain, nausea and vomiting.  Musculoskeletal: Negative for back pain.  Neurological: Positive for speech difficulty. Negative for dizziness, numbness and headaches.    Vital Signs: BP (!) 129/116   Pulse 75   Temp 98.1 F (36.7 C)   Resp (!) 31   Wt 177 lb 7.5 oz (80.5 kg)   SpO2 99%   BMI 29.53 kg/m   Physical Exam Vitals and nursing note reviewed.  Constitutional:      General: She is not in acute distress. HENT:     Mouth/Throat:     Mouth: Mucous membranes are moist.     Pharynx: Oropharynx is clear. No oropharyngeal exudate or posterior oropharyngeal erythema.  Cardiovascular:     Rate and Rhythm: Normal rate and regular rhythm.  Pulmonary:     Effort: Pulmonary effort is normal.     Breath sounds: Normal breath sounds.  Abdominal:     General: There is no distension.     Tenderness: There is no abdominal tenderness.  Skin:    General: Skin is warm and dry.  Neurological:     Mental Status: She is alert.   Alert, awake, and oriented x 3  Speech is slurred but intelligible, comprehension in tact. PERRL bilaterally EOMs without obvious nystagmus or subjective diplopia. Visual fields grossly intac Right facial droop. Tongue midline Motor power - decreased grip strength left upper extremity, full grip strength right upper extremity. Right lower and left lower extremities full. Right upper extremity  pronator drift. Fine motor and coordination grossly in tact Gait - not assessed Romberg - not assessed Heel to toe - not assessed Distal pulses - not assessed    MD Evaluation Airway: WNL Heart: WNL Abdomen: WNL Chest/ Lungs: WNL ASA  Classification: 3 Mallampati/Airway Score: Two   Imaging: MR Brain Wo Contrast (neuro protocol)  Result Date: 07/03/2020 CLINICAL DATA:  Neuro deficit, acute, stroke suspected. Additional history provided: Slurred speech, right-sided weakness, right facial droop. EXAM: MRI HEAD WITHOUT CONTRAST TECHNIQUE: Multiplanar, multiecho pulse sequences of the brain and surrounding structures were obtained without intravenous contrast. COMPARISON:  CT angiogram head/neck and non-contrast CT head performed earlier the same day 07/03/2020. FINDINGS: Brain: Mild generalized cerebral atrophy. There are multifocal small foci of restricted diffusion consistent with acute infarcts within the left frontal and parietal lobes, within the left MCA vascular territory. The largest infarct within the left parietal  lobe postcentral gyrus measures 2.1 cm (series 3, image 18). The remaining acute infarcts are punctate or less than 5 mm in size. Corresponding T2/FLAIR hyperintensity at these sites. No evidence of intracranial mass. No chronic intracranial blood products. No extra-axial fluid collection. No midline shift. Vascular: Signal abnormality within the visualized distal cervical left ICA and pre cavernous left ICA consistent with occlusion demonstrated on CTA performed earlier the same day. Additionally, a proximal left M2 MCA branch occlusion was identified on this prior examination. Skull and upper cervical spine: No focal marrow lesion. Sinuses/Orbits: Visualized orbits show no acute finding. Mild ethmoid sinus mucosal thickening. No significant mastoid effusion. IMPRESSION: Multiple small acute infarcts within the left frontal and parietal lobes within the left MCA vascular  territory. The largest infarct within the left parietal lobe postcentral gyrus measures 2.1 cm. The remaining infarcts are punctate or less than 5 mm. Signal abnormality within the visualized distal cervical and pre cavernous left ICA consistent with vessel occlusion demonstrated at these sites on the prior CTA. A proximal left M2 MCA branch occlusion was better appreciated on this prior study. Electronically Signed   By: Jackey Loge DO   On: 07/03/2020 21:09   CT CEREBRAL PERFUSION W CONTRAST  Result Date: 07/03/2020 CLINICAL DATA:  Slurred speech with right-sided weakness and facial droop. EXAM: CT PERFUSION BRAIN TECHNIQUE: Multiphase CT imaging of the brain was performed following IV bolus contrast injection. Subsequent parametric perfusion maps were calculated using RAPID software. CONTRAST:  63mL OMNIPAQUE IOHEXOL 350 MG/ML SOLN COMPARISON:  None. FINDINGS: CT Brain Perfusion Findings: CBF (<30%) Volume: 28mL Perfusion (Tmax>6.0s) volume: 23mL Mismatch Volume: 44mL ASPECTS on noncontrast CT Head: 10 at 7:05 p.m. today. Infarct Core: 0 mL Infarction Location:None IMPRESSION: 31 mL of ischemic penumbra involving the left frontal operculum without core infarct. These results were communicated to Dr. Erick Blinks at 9:31 pm on 07/03/2020 by text page via the [facility] messaging system. Electronically Signed   By: Deatra Robinson M.D.   On: 07/03/2020 21:31   CT HEAD CODE STROKE WO CONTRAST  Result Date: 07/03/2020 CLINICAL DATA:  Code stroke. Neuro deficit, acute, stroke suspected. Additional history provided: Last known well 1500, right-sided facial droop; slurred speech. EXAM: CT HEAD WITHOUT CONTRAST TECHNIQUE: Contiguous axial images were obtained from the base of the skull through the vertex without intravenous contrast. COMPARISON:  Head CT 04/24/2006 FINDINGS: Brain: Mild generalized cerebral atrophy, progressed as compared to the head CT of 04/24/2006. There is no acute intracranial hemorrhage. No  demarcated cortical infarct is identified. No extra-axial fluid collection. No evidence of intracranial mass. No midline shift. Vascular: No hyperdense vessel. Skull: Normal. Negative for fracture or focal lesion. Sinuses/Orbits: Visualized orbits show no acute finding. No significant paranasal sinus disease or mastoid effusion at the imaged levels. ASPECTS (Alberta Stroke Program Early CT Score) - Ganglionic level infarction (caudate, lentiform nuclei, internal capsule, insula, M1-M3 cortex): 7 - Supraganglionic infarction (M4-M6 cortex): 3 Total score (0-10 with 10 being normal): 10 These results were called by telephone at the time of interpretation on 07/03/2020 at 7:03 pm to provider Dr. Thomasena Edis of neurology, who verbally acknowledged these results. IMPRESSION: No evidence of acute intracranial abnormality. Mild generalized cerebral atrophy, progressed as compared to the head CT of 04/24/2006. Electronically Signed   By: Jackey Loge DO   On: 07/03/2020 19:05   CT ANGIO HEAD CODE STROKE  Result Date: 07/03/2020 CLINICAL DATA:  Neuro deficit, acute, stroke suspected. Facial droop, slurred speech. EXAM: CT ANGIOGRAPHY  HEAD AND NECK TECHNIQUE: Multidetector CT imaging of the head and neck was performed using the standard protocol during bolus administration of intravenous contrast. Multiplanar CT image reconstructions and MIPs were obtained to evaluate the vascular anatomy. Carotid stenosis measurements (when applicable) are obtained utilizing NASCET criteria, using the distal internal carotid diameter as the denominator. CONTRAST:  Administered contrast not known at this time. COMPARISON:  Noncontrast head CT performed earlier the same day. FINDINGS: CTA NECK FINDINGS Aortic arch: Common origin of the innominate and left common carotid arteries. Atherosclerotic plaque within the visualized aortic arch and proximal major branch vessels of the neck. No hemodynamically significant innominate or proximal  subclavian artery stenosis. Right carotid system: The CCA is patent to the bifurcation without stenosis. There is prominent mixed plaque within the proximal ICA with high-grade stenosis and radiographic string sign at this site. Distal to this, the ICA is patent within the neck without significant stenosis Left carotid system: The CCA is patent to the bifurcation without stenosis. Mixed plaque within the proximal ICA. The cervical left ICA becomes occluded shortly beyond its origin and remains occluded throughout the remainder of the neck. Vertebral arteries: The vertebral arteries are patent within the neck. Mixed plaque results in high-grade stenosis at the origin of the left vertebral artery. No significant stenosis within the cervical right vertebral artery. Skeleton: No acute bony abnormality or aggressive osseous lesion. Other neck: No neck mass or cervical lymphadenopathy. Subcentimeter thyroid nodules not meeting consensus criteria for ultrasound follow-up. Upper chest: No consolidation within the imaged lung apices. Review of the MIP images confirms the above findings CTA HEAD FINDINGS Anterior circulation: There is reconstitution of flow within the left ICA at the level of the cavernous segment. However, this vessel remains asymmetrically diminutive in caliber. Additionally, calcified plaque results in moderate/severe stenosis of the distal cavernous/paraclinoid segment. The intracranial right ICA is patent. Calcified plaque within this vessel. Up to moderate stenosis within the cavernous segment. The M1 middle cerebral arteries are patent without significant stenosis. There is occlusion of a proximal M2 left MCA branch vessel (series 11, image 28) (series 6, images 264-266). No right M2 proximal branch occlusion or high-grade proximal stenosis is identified. The anterior cerebral arteries are patent. Hypoplastic left A1 segment. Posterior circulation: The intracranial vertebral arteries are patent. The  basilar artery is patent. The posterior cerebral arteries are patent. The posterior communicating arteries are hypoplastic or absent bilaterally. Venous sinuses: Within limitations of contrast timing, no convincing thrombus. Anatomic variants: As described Review of the MIP images confirms the above findings These results were called by telephone at the time of interpretation on 07/03/2020 at 7:37 pm to provider Dr. Thomasena Edis, who verbally acknowledged these results. IMPRESSION: CTA neck: 1. The left ICA becomes occluded shortly beyond its origin and remains occluded throughout the remainder of the neck. 2. High-grade near occlusive stenosis within the proximal right ICA with radiographic string sign at this site. 3. The vertebral arteries are patent within the neck bilaterally. Severe atherosclerotic narrowing at the origin of the left vertebral artery CTA head: 1. There is reconstitution of the intracranial left ICA at the level of the cavernous segment. However, this vessel remains asymmetrically diminutive. Superimposed moderate/severe atherosclerotic stenosis within the distal cavernous/paraclinoid segment. 2. 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 3. Calcified plaque within the intracranial right ICA with up to moderate stenosis within the cavernous segment. Electronically Signed   By: Jackey Loge DO   On: 07/03/2020 19:38   CT  ANGIO NECK CODE STROKE  Result Date: 07/03/2020 CLINICAL DATA:  Neuro deficit, acute, stroke suspected. Facial droop, slurred speech. EXAM: CT ANGIOGRAPHY HEAD AND NECK TECHNIQUE: Multidetector CT imaging of the head and neck was performed using the standard protocol during bolus administration of intravenous contrast. Multiplanar CT image reconstructions and MIPs were obtained to evaluate the vascular anatomy. Carotid stenosis measurements (when applicable) are obtained utilizing NASCET criteria, using the distal internal carotid diameter as the denominator. CONTRAST:   Administered contrast not known at this time. COMPARISON:  Noncontrast head CT performed earlier the same day. FINDINGS: CTA NECK FINDINGS Aortic arch: Common origin of the innominate and left common carotid arteries. Atherosclerotic plaque within the visualized aortic arch and proximal major branch vessels of the neck. No hemodynamically significant innominate or proximal subclavian artery stenosis. Right carotid system: The CCA is patent to the bifurcation without stenosis. There is prominent mixed plaque within the proximal ICA with high-grade stenosis and radiographic string sign at this site. Distal to this, the ICA is patent within the neck without significant stenosis Left carotid system: The CCA is patent to the bifurcation without stenosis. Mixed plaque within the proximal ICA. The cervical left ICA becomes occluded shortly beyond its origin and remains occluded throughout the remainder of the neck. Vertebral arteries: The vertebral arteries are patent within the neck. Mixed plaque results in high-grade stenosis at the origin of the left vertebral artery. No significant stenosis within the cervical right vertebral artery. Skeleton: No acute bony abnormality or aggressive osseous lesion. Other neck: No neck mass or cervical lymphadenopathy. Subcentimeter thyroid nodules not meeting consensus criteria for ultrasound follow-up. Upper chest: No consolidation within the imaged lung apices. Review of the MIP images confirms the above findings CTA HEAD FINDINGS Anterior circulation: There is reconstitution of flow within the left ICA at the level of the cavernous segment. However, this vessel remains asymmetrically diminutive in caliber. Additionally, calcified plaque results in moderate/severe stenosis of the distal cavernous/paraclinoid segment. The intracranial right ICA is patent. Calcified plaque within this vessel. Up to moderate stenosis within the cavernous segment. The M1 middle cerebral arteries are  patent without significant stenosis. There is occlusion of a proximal M2 left MCA branch vessel (series 11, image 28) (series 6, images 264-266). No right M2 proximal branch occlusion or high-grade proximal stenosis is identified. The anterior cerebral arteries are patent. Hypoplastic left A1 segment. Posterior circulation: The intracranial vertebral arteries are patent. The basilar artery is patent. The posterior cerebral arteries are patent. The posterior communicating arteries are hypoplastic or absent bilaterally. Venous sinuses: Within limitations of contrast timing, no convincing thrombus. Anatomic variants: As described Review of the MIP images confirms the above findings These results were called by telephone at the time of interpretation on 07/03/2020 at 7:37 pm to provider Dr. Thomasena Edis, who verbally acknowledged these results. IMPRESSION: CTA neck: 1. The left ICA becomes occluded shortly beyond its origin and remains occluded throughout the remainder of the neck. 2. High-grade near occlusive stenosis within the proximal right ICA with radiographic string sign at this site. 3. The vertebral arteries are patent within the neck bilaterally. Severe atherosclerotic narrowing at the origin of the left vertebral artery CTA head: 1. There is reconstitution of the intracranial left ICA at the level of the cavernous segment. However, this vessel remains asymmetrically diminutive. Superimposed moderate/severe atherosclerotic stenosis within the distal cavernous/paraclinoid segment. 2. 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 3. Calcified plaque within the intracranial right ICA with up to  moderate stenosis within the cavernous segment. Electronically Signed   By: Jackey LogeKyle  Golden DO   On: 07/03/2020 19:38    Labs:  CBC: Recent Labs    07/03/20 1843 07/03/20 1850 07/03/20 2151  WBC 6.2  --  6.2  HGB 12.9 13.9 14.4  HCT 39.6 41.0 43.8  PLT 299  --  316    COAGS: Recent Labs    07/03/20 1843    INR 1.1  APTT 32    BMP: Recent Labs    07/03/20 1843 07/03/20 1850 07/03/20 2151  NA 135 136  --   K 3.4* 3.3*  --   CL 101 103  --   CO2 20*  --   --   GLUCOSE 121* 116*  --   BUN 15 15  --   CALCIUM 9.4  --   --   CREATININE 1.02* 1.00 1.00  GFRNONAA 52*  --  53*  GFRAA >60  --  >60    LIVER FUNCTION TESTS: Recent Labs    07/03/20 1843  BILITOT 0.7  AST 22  ALT 18  ALKPHOS 71  PROT 7.5  ALBUMIN 3.8    TUMOR MARKERS: No results for input(s): AFPTM, CEA, CA199, CHROMGRNA in the last 8760 hours.  Assessment and Plan:  79 y/o F with new onset facial droop/slurred speech yesterday found to have left MCA territory infarct with left ICA occlusion, high grade near occlusive stenosis within the proximal right ICA, occlusion of proximal left M2 MCA branch and calcified plaque within the intracranial right ICA with moderate stenosis within the cavernous segment. NIR has been consulted for possible intervention with plans to proceed with cerebral angiogram with stent placement under general anesthesia later today.  Risks and benefits of cerebal arteriogram with intervention were discussed with the patient and her son via phone  including, but not limited to bleeding, infection, vascular injury, contrast induced renal failure, stroke, reperfusion hemorrhage, or even death.  This interventional procedure involves the use of X-rays and because of the nature of the planned procedure, it is possible that we will have prolonged use of X-ray fluoroscopy. Potential radiation risks to you include (but are not limited to) the following: - A slightly elevated risk for cancer  several years later in life. This risk is typically less than 0.5% percent. This risk is low in comparison to the normal incidence of human cancer, which is 33% for women and 50% for men according to the [employer] Society. - Radiation induced injury can include skin redness, resembling a rash, tissue breakdown  / ulcers and hair loss (which can be temporary or permanent).  The likelihood of either of these occurring depends on the difficulty of the procedure and whether you are sensitive to radiation due to previous procedures, disease, or genetic conditions.  IF your procedure requires a prolonged use of radiation, you will be notified and given written instructions for further action.  It is your responsibility to monitor the irradiated area for the 2 weeks following the procedure and to notify your physician if you are concerned that you have suffered a radiation induced injury.    All of the patient's son's questions were answered, patient and son are agreeable to proceed.  Consent signed and in chart.  Thank you for this interesting consult.  I greatly enjoyed meeting Micheline RoughMildred D Ethridge and look forward to participating in their care.  A copy of this report was sent to the requesting provider on this date.  Electronically Signed: Villa Herb, PA-C 07/04/2020, 8:50 AM   I spent a total of 40 Minutes  in face to face in clinical consultation, greater than 50% of which was counseling/coordinating care for cerebral angiogram with intervention.

## 2020-07-04 NOTE — Progress Notes (Signed)
SBP goal per provider is 160-220, and BP slightly below goal is acceptable if neuro exam unchanged.

## 2020-07-04 NOTE — Transfer of Care (Signed)
Immediate Anesthesia Transfer of Care Note  Patient: Sharon Gilbert  Procedure(s) Performed: IR WITH ANESTHESIA (N/A )  Patient Location: ICU  Anesthesia Type:General  Level of Consciousness: sedated and Patient remains intubated per anesthesia plan  Airway & Oxygen Therapy: Patient Spontanous Breathing, Patient remains intubated per anesthesia plan and Patient placed on Ventilator (see vital sign flow sheet for setting)  Post-op Assessment: Report given to RN and Post -op Vital signs reviewed and stable  Post vital signs: Reviewed and stable  Last Vitals:  Vitals Value Taken Time  BP 149/51 07/04/20 1818  Temp    Pulse 77 07/04/20 1832  Resp 18 07/04/20 1832  SpO2 98 % 07/04/20 1832  Vitals shown include unvalidated device data.  Last Pain:  Vitals:   07/04/20 0800  TempSrc:   PainSc: 0-No pain         Complications: No complications documented.

## 2020-07-04 NOTE — Progress Notes (Signed)
Dr. Corliss Skains paged about radial a-line not correlating with cuff and whip in the wave. A-line reading 40-50 points higher on SBP. Verbal order to go by the cuff pressure and keep SBP 120-140. Will continue to monitor.

## 2020-07-04 NOTE — Progress Notes (Signed)
Pt's two rings were placed in a denture cup and labeled.  Placed in her purse per pt's request.

## 2020-07-04 NOTE — Progress Notes (Signed)
Patient ID: Sharon Gilbert, female   DOB: 1941/10/05, 79 y.o.   MRN: [id number] INR. 60 Y RT F MRSS 0  New onset slurred ,RT arm weakness and Rt facial droop. CT brain No ICH.Marland KitchenASPECTS 10 CTA .Occluded Lt ICA with string sign and Lt MCA M2 branch occlusion. NIH 2.  Worsening  Speech,and RT Hand weakness.this am. Repeat CT NO ICH but revealing signs of ischemia in the Lt peri sylvian cortical and subcortical region. Endovascular treatment of revascularization of Lt ICA and Lt MCA was D/W son. Reasons,procedurealternatives reviewed. Risk of ICH of 10 % worsening neuro deficit,death and inability to revascularize were discussed. Son  expressed understanding and consented to the treatment. S.Atheena Spano MD

## 2020-07-04 NOTE — Progress Notes (Signed)
STROKE TEAM PROGRESS NOTE   INTERVAL HISTORY Sharon Gilbert is at the bedside. She saw Sharon Gilbert last week for L finger numbness felt to have carpal tunnel. Yesterday, developed RUE weakness and recurrent transient aphasia. Too good for tPA. Found to have L ICA/MCA severe stenosis / occlusion. Scheduled for cerebral angio and possible stent today.  I have personally reviewed history of presenting illness, electronic medical records and imaging films in PACS.  Patient appears to have worsened overnight with now developing right hand weakness but without right upper extremity drift.  This appears to be overnight change.  Discussed with Sharon Gilbert and patient answered questions.  Blood pressure was adequate overnight without documented hyportension Vitals:   07/04/20 0500 07/04/20 0600 07/04/20 0700 07/04/20 0800  BP: (!) 157/84 (!) 163/83 140/75 (!) 129/116  Pulse: 79 73 69 75  Resp: (!) 31  Temp:    98.1 F (36.7 C)  TempSrc:      SpO2: 100% 100% 99% 99%  Weight:       CBC:  Recent Labs  Lab 07/03/20 1843 07/03/20 1843 07/03/20 1850 07/03/20 2151  WBC 6.2  --   --  6.2  NEUTROABS 2.8  --   --   --   HGB 12.9   < > 13.9 14.4  HCT 39.6   < > 41.0 43.8  MCV 86.1  --   --  87.8  PLT 299  --   --  316   < > = values in this interval not displayed.   Basic Metabolic Panel:  Recent Labs  Lab 07/03/20 1843 07/03/20 1843 07/03/20 1850 07/03/20 2151  NA 135  --  136  --   K 3.4*  --  3.3*  --   CL 101  --  103  --   CO2 20*  --   --   --   GLUCOSE 121*  --  116*  --   BUN 15  --  15  --   CREATININE 1.02*   < > 1.00 1.00  CALCIUM 9.4  --   --   --    < > = values in this interval not displayed.   Lipid Panel:  Recent Labs  Lab 07/04/20 0738  CHOL 275*  TRIG 136  HDL 46  CHOLHDL 6.0  VLDL 27  LDLCALC 161*   HgbA1c:  Recent Labs  Lab 07/04/20 0738  HGBA1C 6.1*   Urine Drug Screen: No results for input(s): LABOPIA, COCAINSCRNUR, LABBENZ, AMPHETMU, THCU, LABBARB in the  last 168 hours.  Alcohol Level No results for input(s): ETH in the last 168 hours.  IMAGING past 24 hours MR Brain Wo Contrast (neuro protocol)  Result Date: 07/03/2020 CLINICAL DATA:  Neuro deficit, acute, stroke suspected. Additional history provided: Slurred speech, right-sided weakness, right facial droop. EXAM: MRI HEAD WITHOUT CONTRAST TECHNIQUE: Multiplanar, multiecho pulse sequences of the brain and surrounding structures were obtained without intravenous contrast. COMPARISON:  CT angiogram head/neck and non-contrast CT head performed earlier the same day 07/03/2020. FINDINGS: Brain: Mild generalized cerebral atrophy. There are multifocal small foci of restricted diffusion consistent with acute infarcts within the left frontal and parietal lobes, within the left MCA vascular territory. The largest infarct within the left parietal lobe postcentral gyrus measures 2.1 cm (series 3, image 18). The remaining acute infarcts are punctate or less than 5 mm in size. Corresponding T2/FLAIR hyperintensity at these sites. No evidence of intracranial mass. No chronic intracranial blood products. No extra-axial  fluid collection. No midline shift. Vascular: Signal abnormality within the visualized distal cervical left ICA and pre cavernous left ICA consistent with occlusion demonstrated on CTA performed earlier the same day. Additionally, a proximal left M2 MCA branch occlusion was identified on this prior examination. Skull and upper cervical spine: No focal marrow lesion. Sinuses/Orbits: Visualized orbits show no acute finding. Mild ethmoid sinus mucosal thickening. No significant mastoid effusion. IMPRESSION: Multiple small acute infarcts within the left frontal and parietal lobes within the left MCA vascular territory. The largest infarct within the left parietal lobe postcentral gyrus measures 2.1 cm. The remaining infarcts are punctate or less than 5 mm. Signal abnormality within the visualized distal cervical  and pre cavernous left ICA consistent with vessel occlusion demonstrated at these sites on the prior CTA. A proximal left M2 MCA branch occlusion was better appreciated on this prior study. Electronically Signed   By: Jackey LogeKyle  Golden DO   On: 07/03/2020 21:09   CT CEREBRAL PERFUSION W CONTRAST  Result Date: 07/03/2020 CLINICAL DATA:  Slurred speech with right-sided weakness and facial droop. EXAM: CT PERFUSION BRAIN TECHNIQUE: Multiphase CT imaging of the brain was performed following IV bolus contrast injection. Subsequent parametric perfusion maps were calculated using RAPID software. CONTRAST:  40mL OMNIPAQUE IOHEXOL 350 MG/ML SOLN COMPARISON:  None. FINDINGS: CT Brain Perfusion Findings: CBF (<30%) Volume: 0mL Perfusion (Tmax>6.0s) volume: 31mL Mismatch Volume: 31mL ASPECTS on noncontrast CT Head: 10 at 7:05 p.m. today. Infarct Core: 0 mL Infarction Location:None IMPRESSION: 31 mL of ischemic penumbra involving the left frontal operculum without core infarct. These results were communicated to Sharon Gilbert at 9:31 pm on 07/03/2020 by text page via the [facility] messaging system. Electronically Signed   By: Deatra RobinsonKevin  Herman M.D.   On: 07/03/2020 21:31   CT HEAD CODE STROKE WO CONTRAST  Result Date: 07/03/2020 CLINICAL DATA:  Code stroke. Neuro deficit, acute, stroke suspected. Additional history provided: Last known well 1500, right-sided facial droop; slurred speech. EXAM: CT HEAD WITHOUT CONTRAST TECHNIQUE: Contiguous axial images were obtained from the base of the skull through the vertex without intravenous contrast. COMPARISON:  Head CT 04/24/2006 FINDINGS: Brain: Mild generalized cerebral atrophy, progressed as compared to the head CT of 04/24/2006. There is no acute intracranial hemorrhage. No demarcated cortical infarct is identified. No extra-axial fluid collection. No evidence of intracranial mass. No midline shift. Vascular: No hyperdense vessel. Skull: Normal. Negative for fracture or focal  lesion. Sinuses/Orbits: Visualized orbits show no acute finding. No significant paranasal sinus disease or mastoid effusion at the imaged levels. ASPECTS (Alberta Stroke Program Early CT Score) - Ganglionic level infarction (caudate, lentiform nuclei, internal capsule, insula, M1-M3 cortex): 7 - Supraganglionic infarction (M4-M6 cortex): 3 Total score (0-10 with 10 being normal): 10 These results were called by telephone at the time of interpretation on 07/03/2020 at 7:03 pm to provider Dr. Thomasena Edisollins of neurology, who verbally acknowledged these results. IMPRESSION: No evidence of acute intracranial abnormality. Mild generalized cerebral atrophy, progressed as compared to the head CT of 04/24/2006. Electronically Signed   By: Jackey LogeKyle  Golden DO   On: 07/03/2020 19:05   CT ANGIO HEAD CODE STROKE  Result Date: 07/03/2020 CLINICAL DATA:  Neuro deficit, acute, stroke suspected. Facial droop, slurred speech. EXAM: CT ANGIOGRAPHY HEAD AND NECK TECHNIQUE: Multidetector CT imaging of the head and neck was performed using the standard protocol during bolus administration of intravenous contrast. Multiplanar CT image reconstructions and MIPs were obtained to evaluate the vascular anatomy. Carotid stenosis measurements (when  applicable) are obtained utilizing NASCET criteria, using the distal internal carotid diameter as the denominator. CONTRAST:  Administered contrast not known at this time. COMPARISON:  Noncontrast head CT performed earlier the same day. FINDINGS: CTA NECK FINDINGS Aortic arch: Common origin of the innominate and left common carotid arteries. Atherosclerotic plaque within the visualized aortic arch and proximal major branch vessels of the neck. No hemodynamically significant innominate or proximal subclavian artery stenosis. Right carotid system: The CCA is patent to the bifurcation without stenosis. There is prominent mixed plaque within the proximal ICA with high-grade stenosis and radiographic string sign  at this site. Distal to this, the ICA is patent within the neck without significant stenosis Left carotid system: The CCA is patent to the bifurcation without stenosis. Mixed plaque within the proximal ICA. The cervical left ICA becomes occluded shortly beyond its origin and remains occluded throughout the remainder of the neck. Vertebral arteries: The vertebral arteries are patent within the neck. Mixed plaque results in high-grade stenosis at the origin of the left vertebral artery. No significant stenosis within the cervical right vertebral artery. Skeleton: No acute bony abnormality or aggressive osseous lesion. Other neck: No neck mass or cervical lymphadenopathy. Subcentimeter thyroid nodules not meeting consensus criteria for ultrasound follow-up. Upper chest: No consolidation within the imaged lung apices. Review of the MIP images confirms the above findings CTA HEAD FINDINGS Anterior circulation: There is reconstitution of flow within the left ICA at the level of the cavernous segment. However, this vessel remains asymmetrically diminutive in caliber. Additionally, calcified plaque results in moderate/severe stenosis of the distal cavernous/paraclinoid segment. The intracranial right ICA is patent. Calcified plaque within this vessel. Up to moderate stenosis within the cavernous segment. The M1 middle cerebral arteries are patent without significant stenosis. There is occlusion of a proximal M2 left MCA branch vessel (series 11, image 28) (series 6, images 264-266). No right M2 proximal branch occlusion or high-grade proximal stenosis is identified. The anterior cerebral arteries are patent. Hypoplastic left A1 segment. Posterior circulation: The intracranial vertebral arteries are patent. The basilar artery is patent. The posterior cerebral arteries are patent. The posterior communicating arteries are hypoplastic or absent bilaterally. Venous sinuses: Within limitations of contrast timing, no convincing  thrombus. Anatomic variants: As described Review of the MIP images confirms the above findings These results were called by telephone at the time of interpretation on 07/03/2020 at 7:37 pm to provider Dr. Thomasena Edis, who verbally acknowledged these results. IMPRESSION: CTA neck: 1. The left ICA becomes occluded shortly beyond its origin and remains occluded throughout the remainder of the neck. 2. High-grade near occlusive stenosis within the proximal right ICA with radiographic string sign at this site. 3. The vertebral arteries are patent within the neck bilaterally. Severe atherosclerotic narrowing at the origin of the left vertebral artery CTA head: 1. There is reconstitution of the intracranial left ICA at the level of the cavernous segment. However, this vessel remains asymmetrically diminutive. Superimposed moderate/severe atherosclerotic stenosis within the distal cavernous/paraclinoid segment. 2. 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 3. Calcified plaque within the intracranial right ICA with up to moderate stenosis within the cavernous segment. Electronically Signed   By: Jackey Loge DO   On: 07/03/2020 19:38   CT ANGIO NECK CODE STROKE  Result Date: 07/03/2020 CLINICAL DATA:  Neuro deficit, acute, stroke suspected. Facial droop, slurred speech. EXAM: CT ANGIOGRAPHY HEAD AND NECK TECHNIQUE: Multidetector CT imaging of the head and neck was performed using the standard protocol  during bolus administration of intravenous contrast. Multiplanar CT image reconstructions and MIPs were obtained to evaluate the vascular anatomy. Carotid stenosis measurements (when applicable) are obtained utilizing NASCET criteria, using the distal internal carotid diameter as the denominator. CONTRAST:  Administered contrast not known at this time. COMPARISON:  Noncontrast head CT performed earlier the same day. FINDINGS: CTA NECK FINDINGS Aortic arch: Common origin of the innominate and left common carotid arteries.  Atherosclerotic plaque within the visualized aortic arch and proximal major branch vessels of the neck. No hemodynamically significant innominate or proximal subclavian artery stenosis. Right carotid system: The CCA is patent to the bifurcation without stenosis. There is prominent mixed plaque within the proximal ICA with high-grade stenosis and radiographic string sign at this site. Distal to this, the ICA is patent within the neck without significant stenosis Left carotid system: The CCA is patent to the bifurcation without stenosis. Mixed plaque within the proximal ICA. The cervical left ICA becomes occluded shortly beyond its origin and remains occluded throughout the remainder of the neck. Vertebral arteries: The vertebral arteries are patent within the neck. Mixed plaque results in high-grade stenosis at the origin of the left vertebral artery. No significant stenosis within the cervical right vertebral artery. Skeleton: No acute bony abnormality or aggressive osseous lesion. Other neck: No neck mass or cervical lymphadenopathy. Subcentimeter thyroid nodules not meeting consensus criteria for ultrasound follow-up. Upper chest: No consolidation within the imaged lung apices. Review of the MIP images confirms the above findings CTA HEAD FINDINGS Anterior circulation: There is reconstitution of flow within the left ICA at the level of the cavernous segment. However, this vessel remains asymmetrically diminutive in caliber. Additionally, calcified plaque results in moderate/severe stenosis of the distal cavernous/paraclinoid segment. The intracranial right ICA is patent. Calcified plaque within this vessel. Up to moderate stenosis within the cavernous segment. The M1 middle cerebral arteries are patent without significant stenosis. There is occlusion of a proximal M2 left MCA branch vessel (series 11, image 28) (series 6, images 264-266). No right M2 proximal branch occlusion or high-grade proximal stenosis is  identified. The anterior cerebral arteries are patent. Hypoplastic left A1 segment. Posterior circulation: The intracranial vertebral arteries are patent. The basilar artery is patent. The posterior cerebral arteries are patent. The posterior communicating arteries are hypoplastic or absent bilaterally. Venous sinuses: Within limitations of contrast timing, no convincing thrombus. Anatomic variants: As described Review of the MIP images confirms the above findings These results were called by telephone at the time of interpretation on 07/03/2020 at 7:37 pm to provider Dr. Thomasena Edis, who verbally acknowledged these results. IMPRESSION: CTA neck: 1. The left ICA becomes occluded shortly beyond its origin and remains occluded throughout the remainder of the neck. 2. High-grade near occlusive stenosis within the proximal right ICA with radiographic string sign at this site. 3. The vertebral arteries are patent within the neck bilaterally. Severe atherosclerotic narrowing at the origin of the left vertebral artery CTA head: 1. There is reconstitution of the intracranial left ICA at the level of the cavernous segment. However, this vessel remains asymmetrically diminutive. Superimposed moderate/severe atherosclerotic stenosis within the distal cavernous/paraclinoid segment. 2. 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 3. Calcified plaque within the intracranial right ICA with up to moderate stenosis within the cavernous segment. Electronically Signed   By: Jackey Loge DO   On: 07/03/2020 19:38    PHYSICAL EXAM Pleasant elderly African-American lady not in distress. . Afebrile. Head is nontraumatic. Neck is supple without  bruit.    Cardiac exam no murmur or gallop. Lungs are clear to auscultation. Distal pulses are well felt. Neurological Exam : She is awake alert oriented to time place and person.  Speech slightly nonfluent with occasional word finding difficulties.  Very minimal dysarthria.  Follows commands  well.  Extraocular movements full range without nystagmus.  Blinks to threat bilaterally.  Mild right lower facial asymmetry when she smiles.  Tongue midline.  Order system exam shows no upper extremity drift but significant weakness of right grip intrinsic hand muscles and right wrist drop.  Trace weakness of right hip flexors on double simultaneous testing only.  Sensation appears intact.  Reflexes are symmetric.  Plantars downgoing.  Gait not tested  ASSESSMENT/PLAN [name] is a 79 y.o. female with history of cataracts and diabetes presenting with slurred speech, mild face droop. Found to have a tandem L ICA and M2 occlusion w/ transient aphasia and RUE weakness. Admitted to neuro ICU. Did not receive tPA d/t mild sx.   Stroke:   Small L MCA infarct in setting of L ICA/M2 occlusion from large vessel disease source. IR scheduled.  Code Stroke CT head No acute abnormality. Mild cerebral Atrophy. ASPECTS 10.     CTA head reconstitution of intracranial L ICA at cavernous level but diminuative. Superimposed moderate/severe atherosclerosis distal cavernous/paraclinoid segment.  Proximal L M2 occlusion. Intracranial R ICA w/ moderate stenosis cavernous segment.  CTA neck L ICA occlusion beyond origin and remains occluded through neck. High-grade near occlusive stenosis proximal R ICA w/ string sign. L VA origin w/ severe atherosclerosis narrowing.   MRI  Multiple small L frontal and parietal lobe infarcts. L ICA signal anbormality distal cervical and precavernous c/w occlusion. Proximal L M2 branch occlusion not well seen.  CT perfusion 37mL penumbra L frontal operculum w/o  Core  Ct head 9/29 (new R hand weakness) interval progression L insula & opercular infarct. No hemorrhage. Possible L MCA hyperdensity, ? New thrombus.  Cerebral angio / IR pending   2D Echo pending   LDL 202   HgbA1c 6.1   P2Y12 19   VTE prophylaxis - Lovenox 40 mg sq daily   aspirin 81 mg daily prior to  admission, now on aspirin 81 mg daily and Brilinta (ticagrelor) 90 mg bid following Brilinta load.    Therapy recommendations:  pending   Disposition:  pending   Keep in bed today  Bilateral Carotid Stenosis  CTA neck L ICA occlusion beyond origin and remains occluded through neck. High-grade near occlusive stenosis proximal R ICA w/ string sign. L VA origin w/ severe atherosclerosis narrowing.   Hypertension  Home meds:  Lisinopril-HCTZ 20-12.5 daily . Permissive hypertension (OK if < 220/120) but gradually normalize in 5-7 days . Long-term BP goal normotensive  Hyperlipidemia    Home meds:  None  LDL 202, goal < 70  Add lipitor 80  Continue statin at discharge  Pre-Diabetes   Home meds:  none  HgbA1c 6.1, goal < 7.0  Dysphagia . Secondary to stroke . NPO . Coughing w/ pos . Speech on board  Other Stroke Risk Factors  Advanced age  Obesity, Body mass index is 29.53 kg/m., recommend weight loss, diet and exercise as appropriate   Other Active Problems  chronic right arm and upper back stiffness likely of musculoskeletal etiology as well as right hand paresthesias likely from carpal tunnel syndrome. Put on topamax. - seen by Pearlean Gilbert as an OP last week - EMG pending  Hospital day # 1 Patient presented with left ICA occlusion with left M2 high-grade stenosis with with quite mild deficits on presentation hence was treated conservatively but appears to have worsened overnight and hence will be scheduled for emergent revascularization.  Recommend mild permissive hypertension with systolic blood pressure on 130 or above.  Check CT scan of the head stat to rule out any significant hemorrhage since she has already been preloaded with Brilinta.  Emergent left carotid revascularization with angioplasty stenting.  Long discussion with patient and Sharon Gilbert at the bedside and answered questions about her presentation and treatment plan.  Discussed with Dr. Corliss Skains. This patient is  critically ill and at significant risk of neurological worsening, death and care requires constant monitoring of vital signs, hemodynamics,respiratory and cardiac monitoring, extensive review of multiple databases, frequent neurological assessment, discussion with family, other specialists and medical decision making of high complexity.I have made any additions or clarifications directly to the above note.This critical care time does not reflect procedure time, or teaching time or supervisory time of PA/NP/Med Resident etc but could involve care discussion time.  I spent 40 minutes of neurocritical care time  in the care of  this patient. Delia Heady, MD    To contact Stroke Continuity provider, please refer to [email or web address]. After hours, contact General Neurology

## 2020-07-04 NOTE — Progress Notes (Signed)
PT Cancellation Note  Patient Details Name: Sharon Gilbert MRN: [id number] DOB: [birth year]   Cancelled Treatment:    Reason Eval/Treat Not Completed: Patient at procedure or test/unavailable. Pt in IR for stent procedure. PT will follow up when pt is back from procedure and appropriate to mobilize.   Arlyss Gandy 07/04/2020, 3:17 PM

## 2020-07-04 NOTE — Progress Notes (Signed)
eLink Physician-Brief Progress Note Patient Name: Sharon Gilbert DOB: 1941-03-06 MRN: [id number]   Date of Service  07/04/2020  HPI/Events of Note  Patient needs Phenylephrine infusion re-ordered.  eICU Interventions  Phenylephrine infusion re-ordered with goal SBP of 120-140 mmHg.        Thomasene Lot Kayia Billinger 07/04/2020, 9:10 PM

## 2020-07-04 NOTE — Sedation Documentation (Addendum)
Stent deployed left carotid by Dr Corliss Skains

## 2020-07-04 NOTE — Progress Notes (Signed)
Pt received Brilinta 90mg  at 0835 per Dr. .

## 2020-07-05 ENCOUNTER — Inpatient Hospital Stay (HOSPITAL_COMMUNITY): Payer: Medicare HMO

## 2020-07-05 ENCOUNTER — Encounter (HOSPITAL_COMMUNITY): Payer: Self-pay | Admitting: Radiology

## 2020-07-05 ENCOUNTER — Encounter (HOSPITAL_COMMUNITY)
Admission: EM | Disposition: A | Discharge status: Rehabilitation Facility | Payer: Self-pay | Source: Home / Self Care | Attending: Neurology

## 2020-07-05 DIAGNOSIS — I63232 Cerebral infarction due to unspecified occlusion or stenosis of left carotid arteries: Secondary | ICD-10-CM | POA: Diagnosis not present

## 2020-07-05 LAB — TRIGLYCERIDES: Triglycerides: 156 mg/dL — ABNORMAL HIGH (ref <150)

## 2020-07-05 LAB — CBC WITH DIFFERENTIAL/PLATELET
Abs Immature Granulocytes: 0.03 10*3/uL (ref 0.00–0.07)
Basophils Absolute: 0 10*3/uL (ref 0.0–0.1)
Basophils Relative: 0 %
Eosinophils Absolute: 0 10*3/uL (ref 0.0–0.5)
Eosinophils Relative: 0 %
HCT: 31.6 % — ABNORMAL LOW (ref 36.0–46.0)
Hemoglobin: 10 g/dL — ABNORMAL LOW (ref 12.0–15.0)
Immature Granulocytes: 0 %
Lymphocytes Relative: 12 %
Lymphs Abs: 1.1 10*3/uL (ref 0.7–4.0)
MCH: 28.1 pg (ref 26.0–34.0)
MCHC: 31.6 g/dL (ref 30.0–36.0)
MCV: 88.8 fL (ref 80.0–100.0)
Monocytes Absolute: 0.4 10*3/uL (ref 0.1–1.0)
Monocytes Relative: 5 %
Neutro Abs: 7.5 10*3/uL (ref 1.7–7.7)
Neutrophils Relative %: 83 %
Platelets: 286 10*3/uL (ref 150–400)
RBC: 3.56 MIL/uL — ABNORMAL LOW (ref 3.87–5.11)
RDW: 14 % (ref 11.5–15.5)
WBC: 9.1 10*3/uL (ref 4.0–10.5)
nRBC: 0 % (ref 0.0–0.2)

## 2020-07-05 LAB — BASIC METABOLIC PANEL
Anion gap: 8 (ref 5–15)
BUN: 6 mg/dL — ABNORMAL LOW (ref 8–23)
CO2: 20 mmol/L — ABNORMAL LOW (ref 22–32)
Calcium: 8.4 mg/dL — ABNORMAL LOW (ref 8.9–10.3)
Chloride: 111 mmol/L (ref 98–111)
Creatinine, Ser: 0.69 mg/dL (ref 0.44–1.00)
GFR calc Af Amer: >60 mL/min (ref >60)
GFR calc non Af Amer: >60 mL/min (ref >60)
Glucose, Bld: 150 mg/dL — ABNORMAL HIGH (ref 70–99)
Potassium: 3.2 mmol/L — ABNORMAL LOW (ref 3.5–5.1)
Sodium: 139 mmol/L (ref 135–145)

## 2020-07-05 LAB — MAGNESIUM: Magnesium: 2 mg/dL (ref 1.7–2.4)

## 2020-07-05 IMAGING — CT CT HEAD W/O CM
4 series · 16 of 47 positions shown, 18 images · non-contrast
Comparison: Intraoperative head CT from yesterday

CLINICAL DATA: Stroke follow-up

EXAM:
CT HEAD WITHOUT CONTRAST
TECHNIQUE: Contiguous axial images were obtained from the base of the skull
through the vertex without intravenous contrast.

[Series 3: head wo · axial · 0.43mm/px · z∈[-164,-44]mm · 7 of 33 slices shown, 9 images]
[im 5/33  brain]
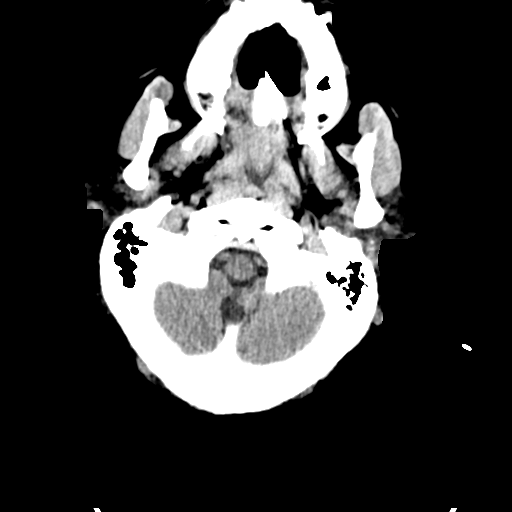
[im 5/33  bone]
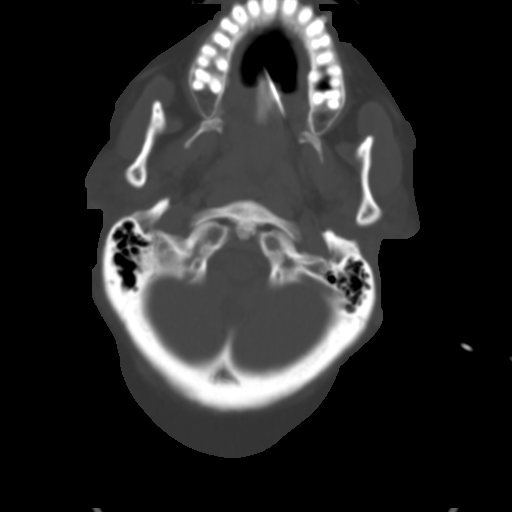
[im 9/33  brain]
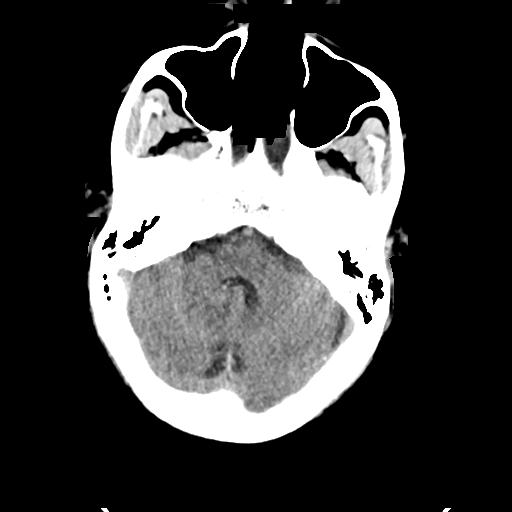
[im 13/33  brain]
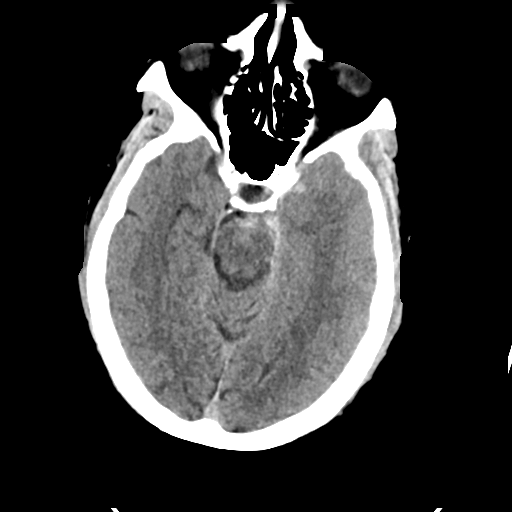
[im 17/33  brain]
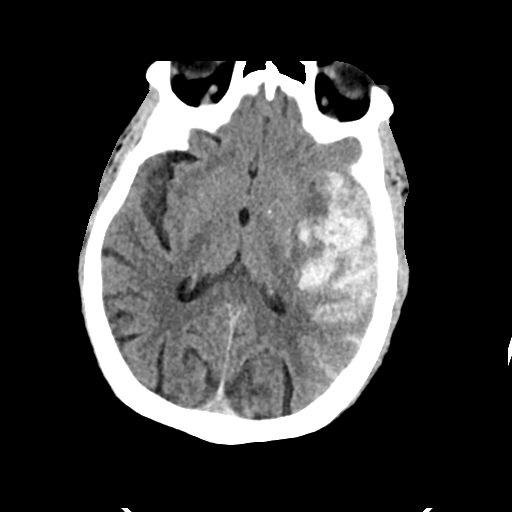
[im 21/33  brain]
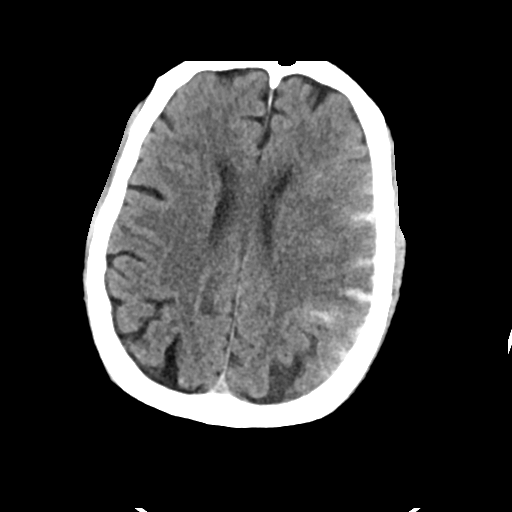
[im 21/33  bone]
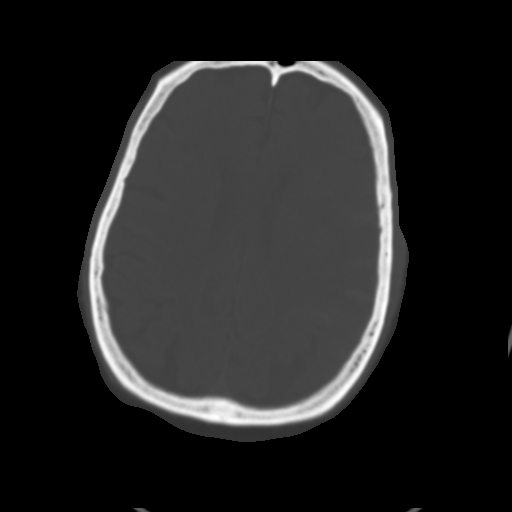
[im 25/33  brain]
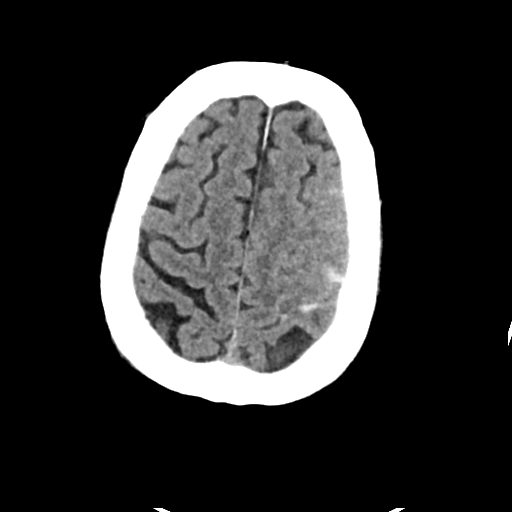
[im 29/33  brain]
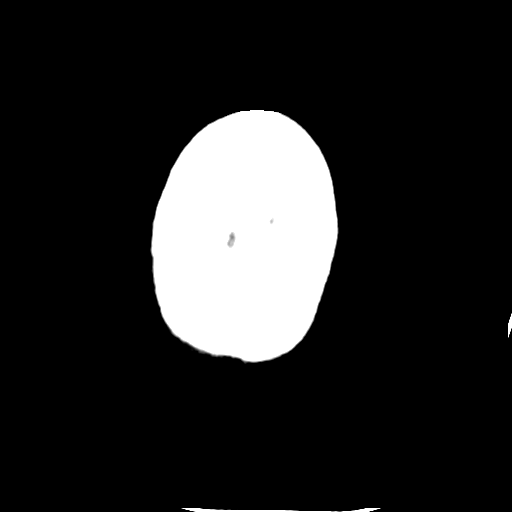

[Series 4: head bone · axial · 0.43mm/px · z∈[-168,-136]mm · 3 of 83 slices shown]
[im 9/83  bone]
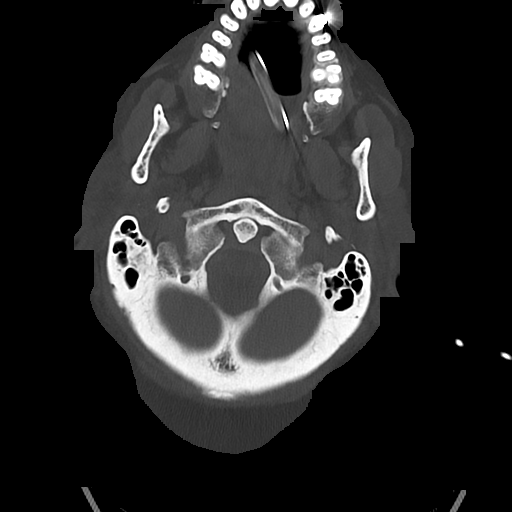
[im 17/83  bone]
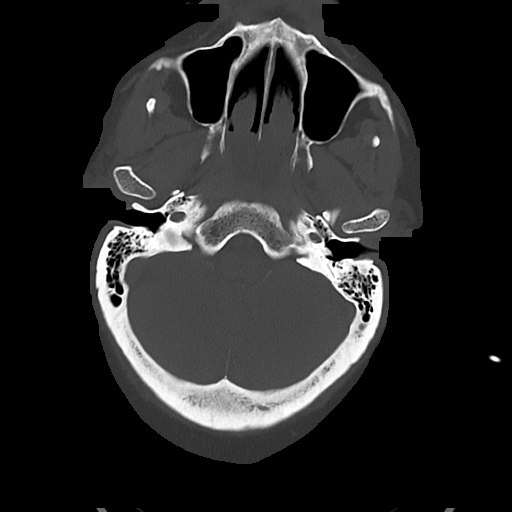
[im 25/83  bone]
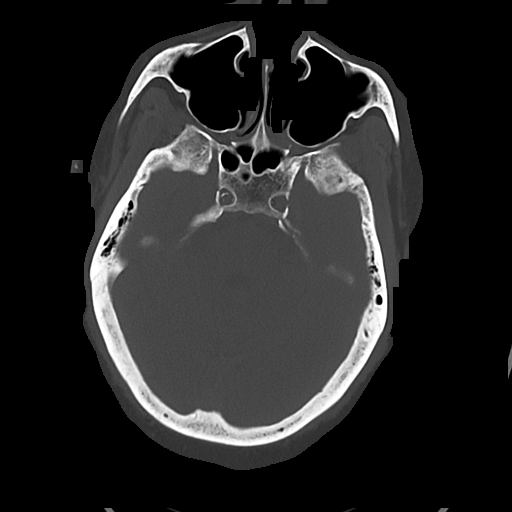

[Series 5: cor soft · coronal · 0.32mm/px · 3 of 68 slices shown]
[im 23/68  brain]
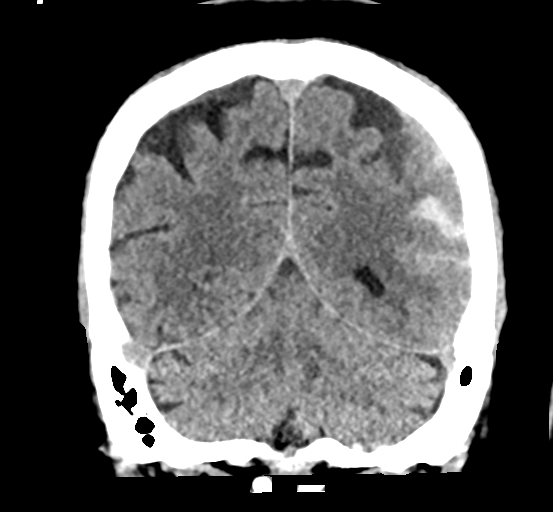
[im 30/68  brain]
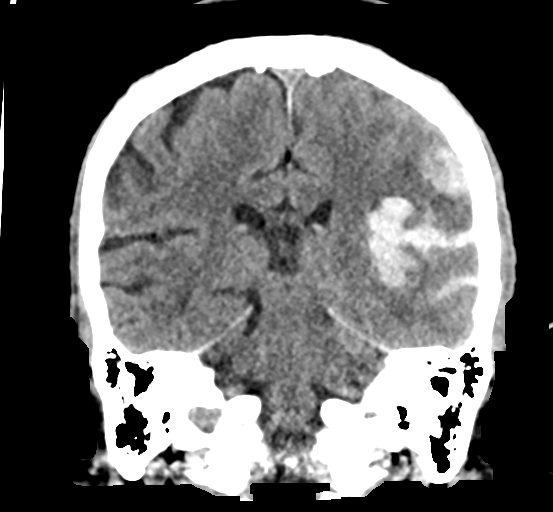
[im 38/68  brain]
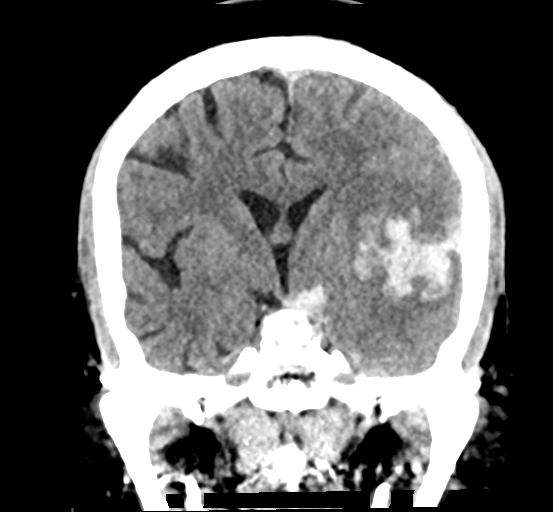

[Series 6: sag soft · sagittal · 0.35mm/px · 3 of 56 slices shown]
[im 19/56  brain]
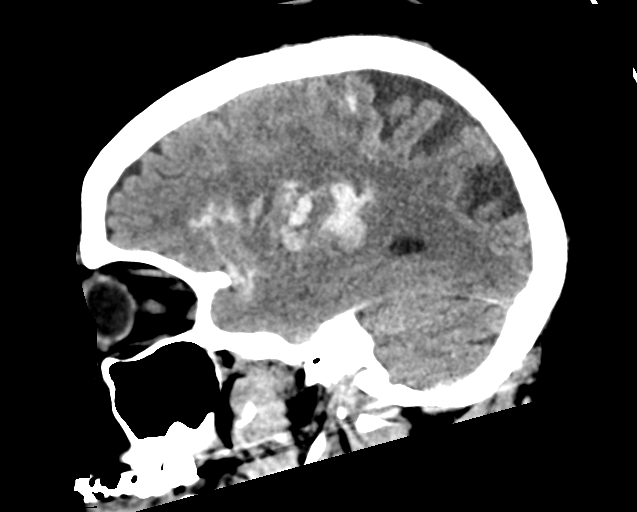
[im 28/56  brain]
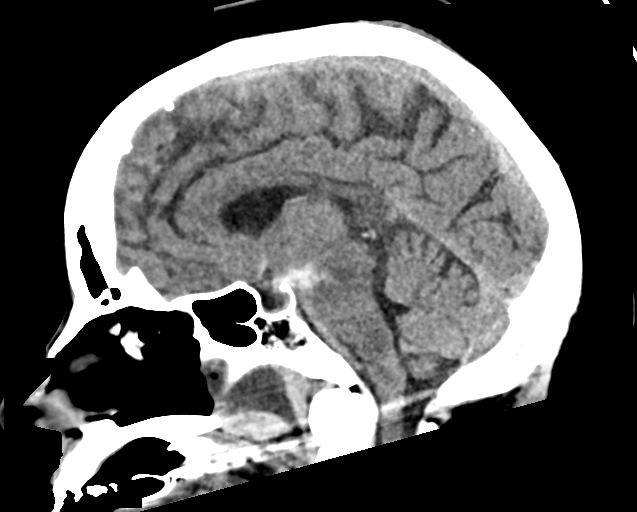
[im 37/56  brain]
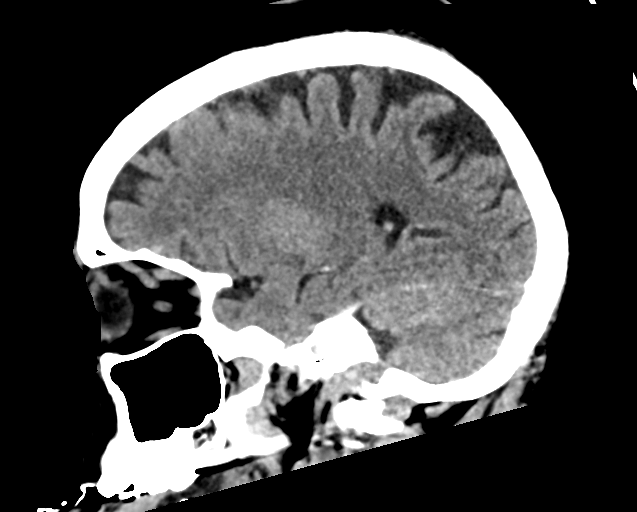

[16 of 47 positions shown; findings below may reference images not displayed]

FINDINGS: Brain: Subarachnoid hemorrhage centered at the left insula and
sylvian fissure, with progressive extent anteriorly and caudal,
reaching the interpeduncular cistern. The thickness in areas of
pre-existing subarachnoid hemorrhage is also greater. Superimposed
cytotoxic edema from insular and lateral frontal infarct. Some
parenchymal hemorrhage has also occurred at the level of the
subinsular white matter, area difficult to quantify given adjacent
thick subarachnoid hemorrhage and irregular shape. No hydrocephalus
and no midline shift.

Vascular: Limited for detecting vessel hyperdensity given the
adjacent blood products on the left.

Skull: Negative

Sinuses/Orbits: Negative

A page has been placed to Dr. TRI.
IMPRESSION: 1. Progressive subarachnoid hemorrhage reaching the basal cisterns.
No hydrocephalus.
2. Interval parenchymal hemorrhage at the level of left insular
infarction.
3. No shift.

## 2020-07-05 IMAGING — DX DG CHEST 1V PORT
1 series · 1 of 1 positions shown · non-contrast
Comparison: [DATE]

CLINICAL DATA: Endotracheal tube placement

EXAM:
PORTABLE CHEST 1 VIEW

[chest ap]
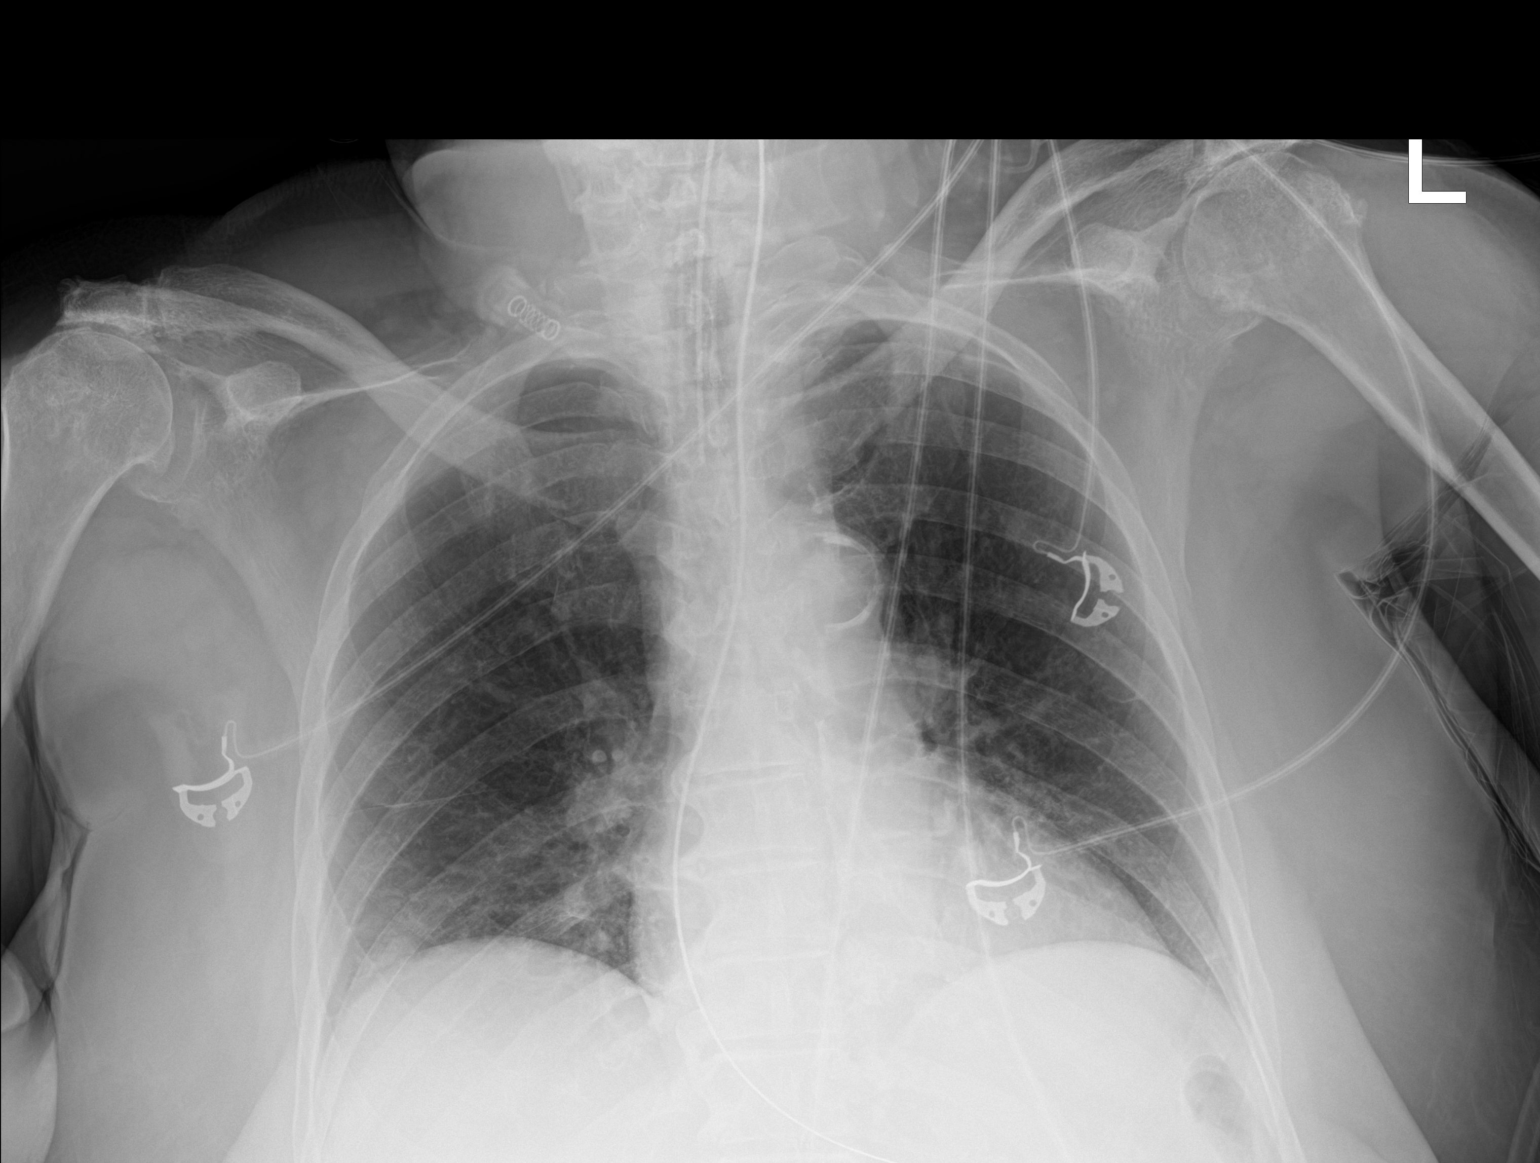

[1 of 1 positions shown; findings below may reference images not displayed]

FINDINGS: Endotracheal tube tip measures 5.2 cm above the carina. Enteric tube
tip is off the field of view but below the left hemidiaphragm.
Shallow inspiration with probable atelectasis in the right base.
Heart size and pulmonary vascularity are normal. No pleural
effusions. No pneumothorax. Mediastinal contours appear intact.
Degenerative changes in the spine and shoulders.
IMPRESSION: Appliances appear in satisfactory position. Shallow inspiration with
atelectasis in the right base.

## 2020-07-05 SURGERY — IR WITH ANESTHESIA
Anesthesia: General

## 2020-07-05 MED ORDER — POLYETHYLENE GLYCOL 3350 17 G PO PACK
17.0000 g | PACK | Freq: Every day | ORAL | Status: DC
  Administered 2020-07-05 – 2020-07-06 (×2): 17 g
  Filled 2020-07-05: qty 1

## 2020-07-05 MED ORDER — LABETALOL HCL 5 MG/ML IV SOLN
20.0000 mg | INTRAVENOUS | Status: DC | PRN
  Administered 2020-07-05: 20 mg via INTRAVENOUS
  Filled 2020-07-05: qty 4

## 2020-07-05 MED ORDER — ORAL CARE MOUTH RINSE
15.0000 mL | Freq: Two times a day (BID) | OROMUCOSAL | Status: DC
  Administered 2020-07-06 (×2): 15 mL via OROMUCOSAL

## 2020-07-05 MED ORDER — SENNOSIDES-DOCUSATE SODIUM 8.6-50 MG PO TABS: 1.0000 | ORAL_TABLET | Freq: Every evening | ORAL | Status: DC | PRN

## 2020-07-05 MED ORDER — PANTOPRAZOLE SODIUM 40 MG PO PACK
40.0000 mg | PACK | Freq: Every day | ORAL | Status: DC
  Administered 2020-07-06: 40 mg
  Filled 2020-07-05: qty 20

## 2020-07-05 MED ORDER — DOCUSATE SODIUM 50 MG/5ML PO LIQD
100.0000 mg | Freq: Two times a day (BID) | ORAL | Status: DC
  Administered 2020-07-06: 100 mg
  Filled 2020-07-05: qty 10

## 2020-07-05 MED ORDER — ATORVASTATIN CALCIUM 80 MG PO TABS
80.0000 mg | ORAL_TABLET | Freq: Every day | ORAL | Status: DC
  Administered 2020-07-06 – 2020-07-09 (×4): 80 mg via ORAL
  Filled 2020-07-05 (×6): qty 1

## 2020-07-05 MED ORDER — HYDRALAZINE HCL 20 MG/ML IJ SOLN: 20.0000 mg | Freq: Four times a day (QID) | INTRAMUSCULAR | Status: DC | PRN

## 2020-07-05 MED ORDER — POTASSIUM CHLORIDE 10 MEQ/100ML IV SOLN
10.0000 meq | INTRAVENOUS | Status: AC
  Administered 2020-07-05 (×4): 10 meq via INTRAVENOUS
  Filled 2020-07-05 (×4): qty 100

## 2020-07-05 MED ORDER — CHLORHEXIDINE GLUCONATE 0.12 % MT SOLN
15.0000 mL | Freq: Two times a day (BID) | OROMUCOSAL | Status: DC
  Administered 2020-07-05 – 2020-07-06 (×3): 15 mL via OROMUCOSAL
  Filled 2020-07-05 (×2): qty 15

## 2020-07-05 MED ORDER — POTASSIUM CHLORIDE 20 MEQ/15ML (10%) PO SOLN
40.0000 meq | Freq: Once | ORAL | Status: AC
  Administered 2020-07-05: 40 meq
  Filled 2020-07-05: qty 30

## 2020-07-05 NOTE — Progress Notes (Signed)
Attending:   Patient seen and examined independently, management discussed with Posey Boyer  Subjective:  Passing SBP, had a neuro intervention yesterday  Objective: Vitals:   07/05/20 1150 07/05/20 1155 07/05/20 1200 07/05/20 1205  BP: 124/61 (!) 141/60 (!) 145/81 (!) 129/106  Pulse: (!) 37 84 72 (!) 36  Resp: 20 15 15  (!) 21  Temp:   (!) 97.4 F (36.3 C)   TempSrc:   Oral   SpO2: 100% 100% 100% 100%  Weight:      Height:       Vent Mode: PSV;CPAP FiO2 (%):  [40 %-100 %] 40 % Set Rate:  [15 bmp] 15 bmp Vt Set:  [450 mL] 450 mL PEEP:  [5 cmH20] 5 cmH20 Pressure Support:  [5 cmH20] 5 cmH20 Plateau Pressure:  [14 cmH20-16 cmH20] 15 cmH20  Intake/Output Summary (Last 24 hours) at 07/05/2020 1236 Last data filed at 07/05/2020 1100 Gross per 24 hour  Intake 3856.86 ml  Output 1150 ml  Net 2706.86 ml    General:  Resting comfortably in bed HENT: NCAT OP clear PULM: CTA B, normal effort CV: RRR, no mgr GI: BS+, soft, nontender MSK: normal bulk and tone Neuro: awake, alert, no distress, MAEW    CBC    Component Value Date/Time   WBC 9.1 07/05/2020 0507   RBC 3.56 (L) 07/05/2020 0507   HGB 10.0 (L) 07/05/2020 0507   HCT 31.6 (L) 07/05/2020 0507   PLT 286 07/05/2020 0507   MCV 88.8 07/05/2020 0507   MCH 28.1 07/05/2020 0507   MCHC 31.6 07/05/2020 0507   RDW 14.0 07/05/2020 0507   LYMPHSABS 1.1 07/05/2020 0507   MONOABS 0.4 07/05/2020 0507   EOSABS 0.0 07/05/2020 0507   BASOSABS 0.0 07/05/2020 0507    BMET    Component Value Date/Time   NA 139 07/05/2020 0507   K 3.2 (L) 07/05/2020 0507   CL 111 07/05/2020 0507   CO2 20 (L) 07/05/2020 0507   GLUCOSE 150 (H) 07/05/2020 0507   BUN 6 (L) 07/05/2020 0507   CREATININE 0.69 07/05/2020 0507   CALCIUM 8.4 (L) 07/05/2020 0507   GFRNONAA >60 07/05/2020 0507   GFRAA >60 07/05/2020 0507    CXR images pulmonary parenchyma within normal limits  Impression/Plan: Acute respiratory failure due to inability to  protect airway> improved, passing SBT, extubate today, SLP evaluation and aspiration precautions after  L ICA occlusion and L MCA stroke > s/p revascularization by IR, secondary stroke prevention and BP management per ID  My cc time 31 minutes  07/07/2020, MD [place] P[facility]M Pager: [phone] Cell: [phone] After 3pm or if no response, call [phone]

## 2020-07-05 NOTE — Progress Notes (Signed)
RT transported patient from [id number] to CT and back without event.

## 2020-07-05 NOTE — Progress Notes (Addendum)
NAME:  Sharon Gilbert, MRN:  [id number], DOB:  [birth year], LOS: 2 ADMISSION DATE:  07/03/2020, CONSULTATION DATE:  07/04/20 REFERRING MD:  Delmer Islam , CHIEF COMPLAINT:  CVA, mechanical ventilation  Brief History   79 yo L ICA/ MCA severe stenosis/ occlusion s/p revascularization 9/29. Remains intubated, PCCM consulted for vent and BP  History of present illness   79 yo F admitted 9/28 as code stroke. Pt son noticed slurred speech and mild facial droop 9/28, and called EMS. Last known normal 07/03/20 1500. Stroke workup revealed L ICA occlusion with probably mid M2 occlusion, as well as near-occlusive stenosis of proximal R ICA. Tpa not given and patient not immediate candidate for NIR intervention due to low NIHSS and tandem L ICA and M2 occlusion. NIR recommended close monitoring, and possible intervention following morning or if clinical change occurs 9/29 Pt with R sided weakness. CT H reveals progression of infarct, patient to [facility] for revascularization of L ICA  Remains intubated after NIR. PCCM consulted for vent, BP management    Past Medical History  Pre diabetes Cataracts   Significant Hospital Events   9/28 admitted to stroke service with L ICA occlusion 9/29 L ICA revascularization with NIR, remains intubated after   Consults:  IR PCCM  Procedures:  L ICA revascularization 9/29 ETT 9/29>  R radial aline 9/29 >>  Significant Diagnostic Tests:  9/28 CT H> interval progression of L MCA territory infarct. No acute hemorrhage or mass Possible hyperdensity L M2 branch  9/29 TTE >> LVEF 70- 75%, hyperdynamic, G1DD, normal RV, moderate TR 9/29 CTH >>Interval progression of infarct now vomiting the left insula and operculum not seen on the prior MRI yesterday. No associated hemorrhage. In addition, there appears to be possible hyperdensity in the left MCA branch in the sylvian fissure which could be due to new thrombus. 9/30 CTH >> progressive SAH reaching basal cisterns,  no hydrocephalus, interval IPH at left insular infarction, no ML shift  Micro Data:  9/28 SARS Cov2> neg  9/28 MRSA PCR > neg  Antimicrobials:  9/29 ancef  Interim history/subjective:  No events overnight On Neo 10 mcg/min for SBP goals; going by cuff pressure-  Large variation in aline/cuff Propofol 20 mcg/kg/min Afebrile  +1.8L CTH overnight -> progressive SAH reaching basal cisterns, no hydrocephalus, interval IPH at left insular infarction, no ML shift  Objective   Blood pressure (!) 154/63, pulse 75, temperature 97.6 F (36.4 C), temperature source Axillary, resp. rate 15, height 5\' 5"  (1.651 m), weight 80.5 kg, SpO2 100 %.    Vent Mode: PRVC FiO2 (%):  [50 %-100 %] 50 % Set Rate:  [15 bmp] 15 bmp Vt Set:  [450 mL] 450 mL PEEP:  [5 cmH20] 5 cmH20 Plateau Pressure:  [14 cmH20-16 cmH20] 15 cmH20   Intake/Output Summary (Last 24 hours) at 07/05/2020 0819 Last data filed at 07/05/2020 0700 Gross per 24 hour  Intake 3724.23 ml  Output 1950 ml  Net 1774.23 ml   Filed Weights   07/03/20 1800 07/04/20 1245  Weight: 80.5 kg 80.5 kg    Examination: General: Elderly female lying in bed in NAD  HEENT: MM pink/moist, ETT 7.0 at 22, OGT, pupils 3/reactive, anicteric  Neuro: Opens eyes, follows gross simple commands, 5/5 on LUE/ LLE, 4/5 RLE, no movement RUE CV: SB 55, no murmur, right groin soft, 2+dp pulses PULM:  CTA, slightly diminished R base, no secretions, doing well on PSV 5/5 on my exam GI: soft, bs +,  purwick  Extremities: warm/dry, no LE edema  Skin: no rashes   9/30 CXR - ETT stable, right basilar atelectasis   Resolved Hospital Problem list     Assessment & Plan:   Acute respiratory insufficiency post procedure, requiring MV P - full MV support, PRVC 6-8 cc/kg, rate 15 - doing well on PSV, likely to extubate this morning - continue VAP/ PPI  - PAD protocol w/propofol/ prn fentanyl  - SLP post extubation  L MCA infarction with L ICA occlusion, s/p  revascularization of L ICA/ MCA w/ stent -s/p NIR 9/29, R ICA near occlusive stenosis  - 9/29 TTE hyperdynamic LV 70-75% - 9/30 repeat CTH showing progressive SAH reaching basal cisterns, no hydrocephalus, interval IPH at left insular infarction, no ML shift P - Per Neurology/ NIR  - Neosynephrine/ cleviprex for SBP goal 120-140; will check manual BP given large discrepancy b/w cuff/ aline  - continue NS 50 ml/hr for now given low dose vasopressor needs/ hyperdynamic status on TTE; UOP/ sCr remains wnl - serial neuro exams   Pre-diabetes per A1c 6.1 Hx of DM? -trend on BMET, at goal 120-180  Inadequate PO intake - start EN if not extubated   Hypokalemia - s/p replete; check mag level   Anemia - Hgb 12.9- > 14.4-> 10 - trend CBC, transfuse for Hgb < 7  Best practice:  Diet: NPO Pain/Anxiety/Delirium protocol (if indicated): prop, PRN fent  VAP protocol (if indicated): yes DVT prophylaxis: STOP lovenox given SAH GI prophylaxis: PPI Glucose control: trend on BMET Mobility: BR Code Status: Full Family Communication: per primary Disposition: ICU   Labs   CBC: Recent Labs  Lab 07/03/20 1843 07/03/20 1850 07/03/20 2151 07/04/20 2009 07/05/20 0507  WBC 6.2  --  6.2  --  9.1  NEUTROABS 2.8  --   --   --  7.5  HGB 12.9 13.9 14.4 11.6* 10.0*  HCT 39.6 41.0 43.8 34.0* 31.6*  MCV 86.1  --  87.8  --  88.8  PLT 299  --  316  --  286    Basic Metabolic Panel: Recent Labs  Lab 07/03/20 1843 07/03/20 1850 07/03/20 2151 07/04/20 2009 07/05/20 0507  NA 135 136  --  141 139  K 3.4* 3.3*  --  3.1* 3.2*  CL 101 103  --   --  111  CO2 20*  --   --   --  20*  GLUCOSE 121* 116*  --   --  150*  BUN 15 15  --   --  6*  CREATININE 1.02* 1.00 1.00  --  0.69  CALCIUM 9.4  --   --   --  8.4*   GFR: Estimated Creatinine Clearance: 58.8 mL/min (by C-G formula based on SCr of 0.69 mg/dL). Recent Labs  Lab 07/03/20 1843 07/03/20 2151 07/05/20 0507  WBC 6.2 6.2 9.1    Liver  Function Tests: Recent Labs  Lab 07/03/20 1843  AST 22  ALT 18  ALKPHOS 71  BILITOT 0.7  PROT 7.5  ALBUMIN 3.8   No results for input(s): LIPASE, AMYLASE in the last 168 hours. No results for input(s): AMMONIA in the last 168 hours.  ABG    Component Value Date/Time   PHART 7.330 (L) 07/04/2020 2009   PCO2ART 41.2 07/04/2020 2009   PO2ART 395 (H) 07/04/2020 2009   HCO3 21.7 07/04/2020 2009   TCO2 23 07/04/2020 2009   ACIDBASEDEF 4.0 (H) 07/04/2020 2009   O2SAT 100.0 07/04/2020 2009  Coagulation Profile: Recent Labs  Lab 07/03/20 1843  INR 1.1    Cardiac Enzymes: No results for input(s): CKTOTAL, CKMB, CKMBINDEX, TROPONINI in the last 168 hours.  HbA1C: Hgb A1c MFr Bld  Date/Time Value Ref Range Status  07/04/2020 07:38 AM 6.1 (H) 4.8 - 5.6 % Final    Comment:    (NOTE) Pre diabetes:          5.7%-6.4%  Diabetes:              >6.4%  Glycemic control for   <7.0% adults with diabetes     CBG: Recent Labs  Lab 07/03/20 1842  GLUCAP 125*   Critical care time: 35 minutes      Posey Boyer, ACNP [facility] 07/05/2020, 8:19 AM  See Loretha Stapler for personal pager PCCM on call pager [phone]

## 2020-07-05 NOTE — Evaluation (Signed)
Speech Language Pathology Evaluation Patient Details Name: KINZA GOUVEIA MRN: [id number] DOB: [birth year] Today's Date: 07/05/2020 Time: 1550-1620 SLP Time Calculation (min) (ACUTE ONLY): 30 min  Problem List:  Patient Active Problem List   Diagnosis Date Noted  . Middle cerebral artery embolism, left 07/04/2020  . Acute ischemic left ICA stroke (HCC) 07/03/2020  . Numbness 06/27/2020   Past Medical History:  Past Medical History:  Diagnosis Date  . Cataract   . Diabetes mellitus without complication [facility])    Past Surgical History:  Past Surgical History:  Procedure Laterality Date  . RADIOLOGY WITH ANESTHESIA N/A 07/04/2020   Procedure: IR WITH ANESTHESIA;  Surgeon: Radiologist, Medication, MD;  Location: [facility] OR;  Service: Radiology;  Laterality: N/A;   HPI:  Patient is an 79 y.o. female with PMH: prediabetes, cataracts who was admitted on 9/28 as code stroke. Patient lives with son and he noticed slurred speech and mild facial droop. Stroke workup revealed L ICA occlusion, near occlusive stenosis of proximal R ICA. 9.28 MRI brain revealed multiple small acute infarcts within left frontal and parietal lobes within left MCA vascular territory. CT on 9/30 revealed progressive subarachnoid hemorrhage reaching the basal cisterns, no hydrocephalus. Patient intubated on 9/29 for L ICA revascularization with NIR and extubated on 9/30 to O2 via nasal cannula.   Assessment / Plan / Recommendation Clinical Impression  Patient presents with a severe expressive aphasia, min receptive aphasia and motor apraxia. She was not able to name any objects or pictures and not able to produce any automatic speech even with initial word cues. She imitated parts of words at word level but this declined significantly at 2-word level. During spontaneous speech, she rarely produced any real words or parts of words, with majority of speech being jargon. Patient did demonstrate some awareness to this via facial  expressions and attempts to start again, but having the same result, eventually giving up. No significant frustration observed. Patient able to follow one step commands without significant difficulty, was able to perform two step but this started to decline in accuracy. She pointed to object pictures when named in field of 6 with 75% accuracy. She pointed to correct spelling of her name in field of two without difficulty or delay. She exhibited some motor apraxia when feeding herself with spoon, but this improved. She exhibited an instance of ideational apraxia trying to use spoon to suck like a straw.    SLP Assessment  SLP Recommendation/Assessment: Patient needs continued Speech Lanaguage Pathology Services SLP Visit Diagnosis: Aphasia (R47.01);Apraxia (R48.2)    Follow Up Recommendations  Inpatient Rehab;24 hour supervision/assistance    Frequency and Duration min 2x/week  2 weeks      SLP Evaluation Cognition  Overall Cognitive Status: Difficult to assess (patient with severe expressive aphasia) Orientation Level: Oriented to person;Other (comment) (unable to thoroughly assess due to severe aphasia)       Comprehension  Auditory Comprehension Overall Auditory Comprehension: Impaired Commands: Impaired One Step Basic Commands: 75-100% accurate Two Step Basic Commands: 75-100% accurate Multistep Basic Commands: 50-74% accurate Visual Recognition/Discrimination Discrimination: Not tested    Expression Expression Primary Mode of Expression: Verbal Verbal Expression Overall Verbal Expression: Impaired Initiation: No impairment Automatic Speech:  (unable to produce any automatic speech) Level of Generative/Spontaneous Verbalization: Word Repetition: Impaired Level of Impairment: Word level;Phrase level Naming: Impairment Confrontation: Impaired (unable to name any pictures or objects) Verbal Errors: Phonemic paraphasias;Aware of errors Pragmatics: No impairment Effective  Techniques: Written cues;Semantic cues Non-Verbal Means of  Communication: Not applicable Written Expression Written Expression: Not tested   Oral / Motor  Oral Motor/Sensory Function Overall Oral Motor/Sensory Function: Moderate impairment Facial ROM: Reduced right;Suspected CN VII (facial) dysfunction Facial Symmetry: Abnormal symmetry right;Suspected CN VII (facial) dysfunction Facial Strength: Reduced right;Suspected CN VII (facial) dysfunction Lingual Symmetry: Within Functional Limits Lingual Strength: Reduced Velum: Within Functional Limits Mandible: Within Functional Limits   GO                    Angela Nevin, MA, CCC-SLP Speech Therapy [facility] Acute Rehab

## 2020-07-05 NOTE — Evaluation (Signed)
Clinical/Bedside Swallow Evaluation Patient Details  Name: Sharon Gilbert MRN: [id number] Date of Birth: Feb 10, 1941  Today's Date: 07/05/2020 Time: SLP Start Time (ACUTE ONLY): 1530 SLP Stop Time (ACUTE ONLY): 1550 SLP Time Calculation (min) (ACUTE ONLY): 20 min  Past Medical History:  Past Medical History:  Diagnosis Date   Cataract    Diabetes mellitus without complication (HCC)    Past Surgical History:  Past Surgical History:  Procedure Laterality Date   RADIOLOGY WITH ANESTHESIA N/A 07/04/2020   Procedure: IR WITH ANESTHESIA;  Surgeon: Radiologist, Medication, MD;  Location: [facility] OR;  Service: Radiology;  Laterality: N/A;   HPI:  Patient is an 79 y.o. female with PMH: prediabetes, cataracts who was admitted on 9/28 as code stroke. Patient lives with son and he noticed slurred speech and mild facial droop. Stroke workup revealed L ICA occlusion, near occlusive stenosis of proximal R ICA. 9.28 MRI brain revealed multiple small acute infarcts within left frontal and parietal lobes within left MCA vascular territory. CT on 9/30 revealed progressive subarachnoid hemorrhage reaching the basal cisterns, no hydrocephalus. Patient intubated on 9/29 for L ICA revascularization with NIR and extubated on 9/30 to O2 via nasal cannula.   Assessment / Plan / Recommendation Clinical Impression  Patient presents with a mod oral and mild-moderate pharyngeal dysphagia with main impact on mastication and oral manipulation and transit of regular solids. Patient with weakness on right side impacting her ability to manage boluses of right buccal cavity. Anterior spillage on right side was not observed during this assessment. Patient suspected to have swallow initiation delay with thin liquids, but did not exhibit any coughing, throat clearing or other overt s/s that could be indicative of aspiration or penetration. No oral residuals or oral holding post initial swallow with puree solids or thin liquids.  Mastication and oral manipulation of regular texture bolus was impaired with prolonged transit of bolus but without significant oral residuals post initial swallow. SLP Visit Diagnosis: Dysphagia, unspecified (R13.10)    Aspiration Risk  Mild aspiration risk;Moderate aspiration risk    Diet Recommendation Dysphagia 2 (Fine chop);Thin liquid   Liquid Administration via: Cup;Straw Medication Administration: Crushed with puree Supervision: Full supervision/cueing for compensatory strategies;Staff to assist with self feeding;Patient able to self feed Compensations: Minimize environmental distractions;Slow rate;Small sips/bites Postural Changes: Seated upright at 90 degrees    Other  Recommendations Oral Care Recommendations: Oral care BID;Staff/trained caregiver to provide oral care Other Recommendations: Clarify dietary restrictions   Follow up Recommendations Inpatient Rehab;24 hour supervision/assistance      Frequency and Duration min 2x/week  2 weeks       Prognosis Prognosis for Safe Diet Advancement: Good      Swallow Study   General Date of Onset: 07/03/20 HPI: Patient is an 79 y.o. female with PMH: prediabetes, cataracts who was admitted on 9/28 as code stroke. Patient lives with son and he noticed slurred speech and mild facial droop. Stroke workup revealed L ICA occlusion, near occlusive stenosis of proximal R ICA. 9.28 MRI brain revealed multiple small acute infarcts within left frontal and parietal lobes within left MCA vascular territory. CT on 9/30 revealed progressive subarachnoid hemorrhage reaching the basal cisterns, no hydrocephalus. Patient intubated on 9/29 for L ICA revascularization with NIR and extubated on 9/30 to O2 via nasal cannula. Type of Study: Bedside Swallow Evaluation Previous Swallow Assessment: None Diet Prior to this Study: NPO Temperature Spikes Noted: No Respiratory Status: Nasal cannula History of Recent Intubation: Yes Length of Intubations  (days):  2 days Date extubated: 07/05/20 Behavior/Cognition: Alert;Pleasant mood;Cooperative Oral Cavity Assessment: Within Functional Limits Oral Care Completed by SLP: Yes Oral Cavity - Dentition: Adequate natural dentition Vision: Functional for self-feeding Self-Feeding Abilities: Able to feed self;Needs assist;Needs set up Patient Positioning: Upright in bed Baseline Vocal Quality: Normal Volitional Cough: Cognitively unable to elicit Volitional Swallow: Unable to elicit    Oral/Motor/Sensory Function Overall Oral Motor/Sensory Function: Moderate impairment Facial ROM: Reduced right Facial Symmetry: Abnormal symmetry right Facial Strength: Reduced right Lingual Symmetry: Within Functional Limits Lingual Strength: Reduced Velum: Within Functional Limits Mandible: Within Functional Limits   Ice Chips Ice chips: Impaired Oral Phase Impairments: Impaired mastication Oral Phase Functional Implications: Oral holding Other Comments: Patient exhibited minimal attempts to masticate ice chip and then seemed to indicate she didn't want it and spit it out when towel offered.   Thin Liquid Thin Liquid: Impaired Presentation: Straw Pharyngeal  Phase Impairments: Suspected delayed Swallow    Nectar Thick     Honey Thick     Puree Puree: Within functional limits Presentation: Spoon   Solid     Solid: Impaired Oral Phase Impairments: Impaired mastication;Reduced labial seal;Reduced lingual movement/coordination Oral Phase Functional Implications: Oral residue;Prolonged oral transit     Angela Nevin, MA, CCC-SLP Speech Therapy [facility] Re[facility]

## 2020-07-05 NOTE — Progress Notes (Signed)
PT Cancellation Note  Patient Details Name: Sharon Gilbert MRN: [id number] DOB: [birth year]   Cancelled Treatment:    Reason Eval/Treat Not Completed: Active bedrest order. Pt with active bedrest order, RN confirms with radiology that pt should remain on bedrest today. PT will attempt to follow up tomorrow.   Arlyss Gandy 07/05/2020, 3:43 PM

## 2020-07-05 NOTE — Progress Notes (Signed)
OT Cancellation Note  Patient Details Name: Sharon Gilbert MRN: [id number] DOB: Feb 11, 1941   Cancelled Treatment:    Reason Eval/Treat Not Completed: Other (comment) (Pt remains intubated this morning, will hold until extubated and stable to initiate OT POC.)  Dalphine Handing, MSOT, OTR/L Acute Rehabilitation Services [facility] Office Number: [phone] Pager: [phone]  Dalphine Handing 07/05/2020, 11:26 AM

## 2020-07-05 NOTE — Progress Notes (Signed)
Supervising Physician: Luanne Bras  Patient Status: [facility] - In-pt  Subjective: S/P bilateral common carotid arteriograms followed by complete revascularization of Lt MCA M1 occlusion with x 1 pass wth 32mm x 59mm embotrap retriever and aspiration achieving a TICI 3 revascularization.. S/P revascularization of symptomatic  acute occlusion of Lt ICA prox with stent assisted angioplasty. New CT this am shows progressive subarach hemorrhage and interval parenchymal hemorrhage D/w PCCM and Neuro  Objective: Physical Exam: BP (!) 154/63 Comment: neo titrated down  Pulse 75   Temp 97.6 F (36.4 C) (Axillary)   Resp 15   Ht $R'5\' 5"'Zj$  (1.651 m)   Wt 80.5 kg   SpO2 100%   BMI 29.53 kg/m  Intubated and on Propofol sedation but awake and responsive to simple commands. (R)groin soft, NT, no hematoma Moves LE equally.   Current Facility-Administered Medications:  .  0.9 %  sodium chloride infusion, , Intravenous, Continuous, Jennelle Human B, NP, Stopped at 07/04/20 1957 .  0.9 %  sodium chloride infusion, 250 mL, Intravenous, Continuous, Donnetta Simpers, MD .  acetaminophen (TYLENOL) tablet 650 mg, 650 mg, Oral, Q4H PRN **OR** acetaminophen (TYLENOL) 160 MG/5ML solution 650 mg, 650 mg, Per Tube, Q4H PRN **OR** acetaminophen (TYLENOL) suppository 650 mg, 650 mg, Rectal, Q4H PRN, Deveshwar, Sanjeev, MD .  aspirin chewable tablet 81 mg, 81 mg, Oral, Daily **OR** aspirin chewable tablet 81 mg, 81 mg, Per Tube, Daily, Deveshwar, Sanjeev, MD .  atorvastatin (LIPITOR) tablet 80 mg, 80 mg, Per Tube, q1800, Luanne Bras, MD, 80 mg at 07/04/20 2123 .  chlorhexidine gluconate (MEDLINE KIT) (PERIDEX) 0.12 % solution 15 mL, 15 mL, Mouth Rinse, BID, Bowser, Grace E, NP, 15 mL at 07/05/20 0800 .  Chlorhexidine Gluconate Cloth 2 % PADS 6 each, 6 each, Topical, Daily, Donnetta Simpers, MD .  clevidipine (CLEVIPREX) infusion 0.5 mg/mL, 0-21 mg/hr, Intravenous, Continuous, Luanne Bras,  MD, Held at 07/04/20 1947 .  docusate (COLACE) 50 MG/5ML liquid 100 mg, 100 mg, Oral, BID, Bowser, Grace E, NP .  fentaNYL (SUBLIMAZE) injection 25 mcg, 25 mcg, Intravenous, Q15 min PRN, Bowser, Grace E, NP .  fentaNYL (SUBLIMAZE) injection 25-100 mcg, 25-100 mcg, Intravenous, Q30 min PRN, Bowser, Laurel Dimmer, NP, 100 mcg at 07/05/20 0604 .  MEDLINE mouth rinse, 15 mL, Mouth Rinse, BID, Lorrin Goodell, Salman, MD .  MEDLINE mouth rinse, 15 mL, Mouth Rinse, 10 times per day, Cristal Generous, NP, 15 mL at 07/05/20 0508 .  pantoprazole (PROTONIX) injection 40 mg, 40 mg, Intravenous, Daily, Bowser, Grace E, NP, 40 mg at 07/04/20 2016 .  phenylephrine (NEOSYNEPHRINE) 10-0.9 MG/250ML-% infusion, 0-200 mcg/min, Intravenous, Titrated, Donnetta Simpers, MD, Last Rate: 45 mL/hr at 07/05/20 0700, 30 mcg/min at 07/05/20 0700 .  polyethylene glycol (MIRALAX / GLYCOLAX) packet 17 g, 17 g, Oral, Daily, Bowser, Grace E, NP .  potassium chloride 10 mEq in 100 mL IVPB, 10 mEq, Intravenous, Q1 Hr x 4, Simpson, Paula B, NP .  potassium chloride 20 MEQ/15ML (10%) solution 40 mEq, 40 mEq, Per Tube, Once, Simpson, Paula B, NP .  propofol (DIPRIVAN) 1000 MG/100ML infusion, 0-50 mcg/kg/min, Intravenous, Continuous, Bowser, Laurel Dimmer, NP, Stopped at 07/05/20 [phone] .  senna-docusate (Senokot-S) tablet 1 tablet, 1 tablet, Oral, QHS PRN, Donnetta Simpers, MD .  ticagrelor (BRILINTA) tablet 90 mg, 90 mg, Oral, BID **OR** ticagrelor (BRILINTA) tablet 90 mg, 90 mg, Per Tube, BID, Deveshwar, Sanjeev, MD, 90 mg at 07/04/20 2122  Labs: CBC Recent Labs    07/03/20  1843 07/03/20 1850 07/04/20 2009 07/05/20 0507  WBC 6.2  --   --  9.1  HGB 12.9   < > 11.6* 10.0*  HCT 39.6   < > 34.0* 31.6*  PLT 299  --   --  286   < > = values in this interval not displayed.   BMET Recent Labs    07/03/20 1843 07/03/20 1843 07/03/20 1850 07/03/20 1850 07/04/20 2009 07/05/20 0507  NA 135   < > 136   < > 141 139  K 3.4*   < > 3.3*   < >  3.1* 3.2*  CL 101   < > 103  --   --  111  CO2 20*  --   --   --   --  20*  GLUCOSE 121*   < > 116*  --   --  150*  BUN 15   < > 15  --   --  6*  CREATININE 1.02*   < > 1.00  --   --  0.69  CALCIUM 9.4  --   --   --   --  8.4*   < > = values in this interval not displayed.   LFT Recent Labs    07/03/20 1843  PROT 7.5  ALBUMIN 3.8  AST 22  ALT 18  ALKPHOS 71  BILITOT 0.7   PT/INR Recent Labs    07/03/20 1843  LABPROT 13.4  INR 1.1     Studies/Results: CT Head Wo Contrast  Result Date: 07/05/2020 CLINICAL DATA:  Stroke follow-up EXAM: CT HEAD WITHOUT CONTRAST TECHNIQUE: Contiguous axial images were obtained from the base of the skull through the vertex without intravenous contrast. COMPARISON:  Intraoperative head CT from yesterday FINDINGS: Brain: Subarachnoid hemorrhage centered at the left insula and sylvian fissure, with progressive extent anteriorly and caudal, reaching the interpeduncular cistern. The thickness in areas of pre-existing subarachnoid hemorrhage is also greater. Superimposed cytotoxic edema from insular and lateral frontal infarct. Some parenchymal hemorrhage has also occurred at the level of the subinsular white matter, area difficult to quantify given adjacent thick subarachnoid hemorrhage and irregular shape. No hydrocephalus and no midline shift. Vascular: Limited for detecting vessel hyperdensity given the adjacent blood products on the left. Skull: Negative Sinuses/Orbits: Negative A page has been placed to Dr. Leonie Man. IMPRESSION: 1. Progressive subarachnoid hemorrhage reaching the basal cisterns. No hydrocephalus. 2. Interval parenchymal hemorrhage at the level of left insular infarction. 3. No shift. Electronically Signed   By: Monte Fantasia M.D.   On: 07/05/2020 08:05   CT HEAD WO CONTRAST  Addendum Date: 07/04/2020   ADDENDUM REPORT: 07/04/2020 13:16 ADDENDUM: Code stroke imaging results were communicated on 07/04/2020 at 1:16 pm to provider Sethi via  secure text paging. Electronically Signed   By: Franchot Gallo M.D.   On: 07/04/2020 13:16   Result Date: 07/04/2020 CLINICAL DATA:  Stroke follow-up EXAM: CT HEAD WITHOUT CONTRAST TECHNIQUE: Contiguous axial images were obtained from the base of the skull through the vertex without intravenous contrast. COMPARISON:  CT angio head and neck and MRI head 07/03/2020 FINDINGS: Brain: New area of hypodensity in the left insula and left operculum compatible with acute infarct not seen on prior CT or MRI. Additional small areas of acute infarct in the left frontal and parietal cortex compatible with acute infarction best seen on MRI. No acute hemorrhage or mass.  Ventricle size normal. Vascular: Possible hyperdensity left M2 branch which is more apparent on the current  study and may be new. Skull: Negative Sinuses/Orbits: Paranasal sinuses clear.  Negative orbit Other: None IMPRESSION: Interval progression of infarct now vomiting the left insula and operculum not seen on the prior MRI yesterday. No associated hemorrhage. In addition, there appears to be possible hyperdensity in the left MCA branch in the sylvian fissure which could be due to new thrombus. Electronically Signed: By: Franchot Gallo M.D. On: 07/04/2020 12:55   MR Brain Wo Contrast (neuro protocol)  Result Date: 07/03/2020 CLINICAL DATA:  Neuro deficit, acute, stroke suspected. Additional history provided: Slurred speech, right-sided weakness, right facial droop. EXAM: MRI HEAD WITHOUT CONTRAST TECHNIQUE: Multiplanar, multiecho pulse sequences of the brain and surrounding structures were obtained without intravenous contrast. COMPARISON:  CT angiogram head/neck and non-contrast CT head performed earlier the same day 07/03/2020. FINDINGS: Brain: Mild generalized cerebral atrophy. There are multifocal small foci of restricted diffusion consistent with acute infarcts within the left frontal and parietal lobes, within the left MCA vascular territory. The  largest infarct within the left parietal lobe postcentral gyrus measures 2.1 cm (series 3, image 18). The remaining acute infarcts are punctate or less than 5 mm in size. Corresponding T2/FLAIR hyperintensity at these sites. No evidence of intracranial mass. No chronic intracranial blood products. No extra-axial fluid collection. No midline shift. Vascular: Signal abnormality within the visualized distal cervical left ICA and pre cavernous left ICA consistent with occlusion demonstrated on CTA performed earlier the same day. Additionally, a proximal left M2 MCA branch occlusion was identified on this prior examination. Skull and upper cervical spine: No focal marrow lesion. Sinuses/Orbits: Visualized orbits show no acute finding. Mild ethmoid sinus mucosal thickening. No significant mastoid effusion. IMPRESSION: Multiple small acute infarcts within the left frontal and parietal lobes within the left MCA vascular territory. The largest infarct within the left parietal lobe postcentral gyrus measures 2.1 cm. The remaining infarcts are punctate or less than 5 mm. Signal abnormality within the visualized distal cervical and pre cavernous left ICA consistent with vessel occlusion demonstrated at these sites on the prior CTA. A proximal left M2 MCA branch occlusion was better appreciated on this prior study. Electronically Signed   By: Kellie Simmering DO   On: 07/03/2020 21:09   CT CEREBRAL PERFUSION W CONTRAST  Result Date: 07/03/2020 CLINICAL DATA:  Slurred speech with right-sided weakness and facial droop. EXAM: CT PERFUSION BRAIN TECHNIQUE: Multiphase CT imaging of the brain was performed following IV bolus contrast injection. Subsequent parametric perfusion maps were calculated using RAPID software. CONTRAST:  59mL OMNIPAQUE IOHEXOL 350 MG/ML SOLN COMPARISON:  None. FINDINGS: CT Brain Perfusion Findings: CBF (<30%) Volume: 42mL Perfusion (Tmax>6.0s) volume: 58mL Mismatch Volume: 30mL ASPECTS on noncontrast CT Head:  10 at 7:05 p.m. today. Infarct Core: 0 mL Infarction Location:None IMPRESSION: 31 mL of ischemic penumbra involving the left frontal operculum without core infarct. These results were communicated to Dr. Donnetta Simpers at 9:31 pm on 07/03/2020 by text page via the [employer] messaging system. Electronically Signed   By: Ulyses Jarred M.D.   On: 07/03/2020 21:31   Portable Chest xray  Result Date: 07/05/2020 CLINICAL DATA:  Endotracheal tube placement EXAM: PORTABLE CHEST 1 VIEW COMPARISON:  07/04/2020 FINDINGS: Endotracheal tube tip measures 5.2 cm above the carina. Enteric tube tip is off the field of view but below the left hemidiaphragm. Shallow inspiration with probable atelectasis in the right base. Heart size and pulmonary vascularity are normal. No pleural effusions. No pneumothorax. Mediastinal contours appear intact. Degenerative changes in the spine  and shoulders. IMPRESSION: Appliances appear in satisfactory position. Shallow inspiration with atelectasis in the right base. Electronically Signed   By: Burman Nieves M.D.   On: 07/05/2020 05:22   Portable Chest x-ray  Result Date: 07/04/2020 CLINICAL DATA:  Intubated EXAM: PORTABLE CHEST 1 VIEW COMPARISON:  None. FINDINGS: Endotracheal tube tip about 4 cm superior to the carina. Esophageal tube tip is below the diaphragm but is incompletely visualized. No focal opacity or pleural effusion. Normal heart size. Aortic atherosclerosis. No pneumothorax. IMPRESSION: Endotracheal tube tip about 4 cm superior to the carina. No acute pulmonary infiltrate. Electronically Signed   By: Jasmine Pang M.D.   On: 07/04/2020 20:02   DG Abd Portable 1V  Result Date: 07/04/2020 CLINICAL DATA:  OG tube placement EXAM: PORTABLE ABDOMEN - 1 VIEW COMPARISON:  None. FINDINGS: Esophageal tube tip overlies the gastric fundus, side-port in the region of GE junction. Upper gas pattern is unremarkable IMPRESSION: Esophageal tube tip overlies the gastric fundus with  side-port at the GE junction, consider further advancement for more optimal positioning. Electronically Signed   By: Jasmine Pang M.D.   On: 07/04/2020 20:03   ECHOCARDIOGRAM COMPLETE  Result Date: 07/04/2020    ECHOCARDIOGRAM REPORT   Patient Name:   Sharon Gilbert Date of Exam: 07/04/2020 Medical Rec #:  [id number]       Height:       65.0 in Accession #:    [id number]      Weight:       177.5 lb Date of Birth:  [birth year]       BSA:          1.880 m Patient Age:    78 years        BP:           210/168 mmHg Patient Gender: F               HR:           74 bpm. Exam Location:  Inpatient Procedure: 2D Echo, Cardiac Doppler, Color Doppler and Intracardiac            Opacification Agent Indications:    Stroke 434.91 / I163.9  History:        Patient has no prior history of Echocardiogram examinations.                 Risk Factors:Diabetes.  Sonographer:    Tiffany Dance Referring Phys: [id number] [facility] KHALIQDINA IMPRESSIONS  1. Left ventricular ejection fraction, by estimation, is 70 to 75%. The left ventricle has hyperdynamic function. The left ventricle has no regional wall motion abnormalities. Left ventricular diastolic parameters are consistent with Grade I diastolic dysfunction (impaired relaxation).  2. Right ventricular systolic function is normal. The right ventricular size is normal.  3. The mitral valve is normal in structure. No evidence of mitral valve regurgitation. No evidence of mitral stenosis.  4. Tricuspid valve regurgitation is moderate.  5. The aortic valve is tricuspid. Aortic valve regurgitation is not visualized. Mild to moderate aortic valve sclerosis/calcification is present, without any evidence of aortic stenosis.  6. The inferior vena cava is normal in size with greater than 50% respiratory variability, suggesting right atrial pressure of 3 mmHg. Conclusion(s)/Recommendation(s): No intracardiac source of embolism detected on this transthoracic study. A transesophageal echocardiogram is  recommended to exclude cardiac source of embolism if clinically indicated. FINDINGS  Left Ventricle: Left ventricular ejection fraction, by estimation, is 70 to 75%. The left ventricle has hyperdynamic  function. The left ventricle has no regional wall motion abnormalities. The left ventricular internal cavity size was normal in size. There is no left ventricular hypertrophy. Left ventricular diastolic parameters are consistent with Grade I diastolic dysfunction (impaired relaxation). Right Ventricle: The right ventricular size is normal. No increase in right ventricular wall thickness. Right ventricular systolic function is normal. Left Atrium: Left atrial size was normal in size. Right Atrium: Right atrial size was normal in size. Pericardium: There is no evidence of pericardial effusion. Mitral Valve: The mitral valve is normal in structure. No evidence of mitral valve regurgitation. No evidence of mitral valve stenosis. Tricuspid Valve: The tricuspid valve is normal in structure. Tricuspid valve regurgitation is moderate . No evidence of tricuspid stenosis. Aortic Valve: The aortic valve is tricuspid. Aortic valve regurgitation is not visualized. Mild to moderate aortic valve sclerosis/calcification is present, without any evidence of aortic stenosis. Pulmonic Valve: The pulmonic valve was normal in structure. Pulmonic valve regurgitation is not visualized. No evidence of pulmonic stenosis. Aorta: The aortic root is normal in size and structure. Venous: The inferior vena cava is normal in size with greater than 50% respiratory variability, suggesting right atrial pressure of 3 mmHg. IAS/Shunts: No atrial level shunt detected by color flow Doppler.  LEFT VENTRICLE PLAX 2D LVIDd:         3.80 cm LVIDs:         2.50 cm LV PW:         1.00 cm LV IVS:        1.00 cm LVOT diam:     1.40 cm LV SV:         42 LV SV Index:   22 LVOT Area:     1.54 cm  RIGHT VENTRICLE          IVC RV Basal diam:  2.20 cm  IVC diam: 1.20  cm TAPSE (M-mode): 1.9 cm LEFT ATRIUM             Index       RIGHT ATRIUM           Index LA diam:        3.40 cm 1.81 cm/m  RA Area:     10.70 cm LA Vol (A2C):   57.5 ml 30.58 ml/m RA Volume:   22.20 ml  11.81 ml/m LA Vol (A4C):   33.9 ml 18.03 ml/m LA Biplane Vol: 45.1 ml 23.99 ml/m  AORTIC VALVE LVOT Vmax:   107.60 cm/s LVOT Vmean:  76.600 cm/s LVOT VTI:    0.271 m  AORTA Ao Root diam: 2.90 cm Ao Asc diam:  3.20 cm MITRAL VALVE MV Area (PHT): 2.55 cm    SHUNTS MV Decel Time: 297 msec    Systemic VTI:  0.27 m MV E velocity: 50.60 cm/s  Systemic Diam: 1.40 cm MV A velocity: 78.00 cm/s MV E/A ratio:  0.65 Candee Furbish MD Electronically signed by Candee Furbish MD Signature Date/Time: 07/04/2020/11:10:22 AM    Final    CT HEAD CODE STROKE WO CONTRAST  Result Date: 07/03/2020 CLINICAL DATA:  Code stroke. Neuro deficit, acute, stroke suspected. Additional history provided: Last known well 1500, right-sided facial droop; slurred speech. EXAM: CT HEAD WITHOUT CONTRAST TECHNIQUE: Contiguous axial images were obtained from the base of the skull through the vertex without intravenous contrast. COMPARISON:  Head CT 04/24/2006 FINDINGS: Brain: Mild generalized cerebral atrophy, progressed as compared to the head CT of 04/24/2006. There is no acute intracranial hemorrhage. No demarcated cortical  infarct is identified. No extra-axial fluid collection. No evidence of intracranial mass. No midline shift. Vascular: No hyperdense vessel. Skull: Normal. Negative for fracture or focal lesion. Sinuses/Orbits: Visualized orbits show no acute finding. No significant paranasal sinus disease or mastoid effusion at the imaged levels. ASPECTS ([place] Stroke Program Early CT Score) - Ganglionic level infarction (caudate, lentiform nuclei, internal capsule, insula, M1-M3 cortex): 7 - Supraganglionic infarction (M4-M6 cortex): 3 Total score (0-10 with 10 being normal): 10 These results were called by telephone at the time of  interpretation on 07/03/2020 at 7:03 pm to provider Dr. Theda Sers of neurology, who verbally acknowledged these results. IMPRESSION: No evidence of acute intracranial abnormality. Mild generalized cerebral atrophy, progressed as compared to the head CT of 04/24/2006. Electronically Signed   By: Kellie Simmering DO   On: 07/03/2020 19:05   CT ANGIO HEAD CODE STROKE  Result Date: 07/03/2020 CLINICAL DATA:  Neuro deficit, acute, stroke suspected. Facial droop, slurred speech. EXAM: CT ANGIOGRAPHY HEAD AND NECK TECHNIQUE: Multidetector CT imaging of the head and neck was performed using the standard protocol during bolus administration of intravenous contrast. Multiplanar CT image reconstructions and MIPs were obtained to evaluate the vascular anatomy. Carotid stenosis measurements (when applicable) are obtained utilizing NASCET criteria, using the distal internal carotid diameter as the denominator. CONTRAST:  Administered contrast not known at this time. COMPARISON:  Noncontrast head CT performed earlier the same day. FINDINGS: CTA NECK FINDINGS Aortic arch: Common origin of the innominate and left common carotid arteries. Atherosclerotic plaque within the visualized aortic arch and proximal major branch vessels of the neck. No hemodynamically significant innominate or proximal subclavian artery stenosis. Right carotid system: The CCA is patent to the bifurcation without stenosis. There is prominent mixed plaque within the proximal ICA with high-grade stenosis and radiographic string sign at this site. Distal to this, the ICA is patent within the neck without significant stenosis Left carotid system: The CCA is patent to the bifurcation without stenosis. Mixed plaque within the proximal ICA. The cervical left ICA becomes occluded shortly beyond its origin and remains occluded throughout the remainder of the neck. Vertebral arteries: The vertebral arteries are patent within the neck. Mixed plaque results in high-grade  stenosis at the origin of the left vertebral artery. No significant stenosis within the cervical right vertebral artery. Skeleton: No acute bony abnormality or aggressive osseous lesion. Other neck: No neck mass or cervical lymphadenopathy. Subcentimeter thyroid nodules not meeting consensus criteria for ultrasound follow-up. Upper chest: No consolidation within the imaged lung apices. Review of the MIP images confirms the above findings CTA HEAD FINDINGS Anterior circulation: There is reconstitution of flow within the left ICA at the level of the cavernous segment. However, this vessel remains asymmetrically diminutive in caliber. Additionally, calcified plaque results in moderate/severe stenosis of the distal cavernous/paraclinoid segment. The intracranial right ICA is patent. Calcified plaque within this vessel. Up to moderate stenosis within the cavernous segment. The M1 middle cerebral arteries are patent without significant stenosis. There is occlusion of a proximal M2 left MCA branch vessel (series 11, image 28) (series 6, images 264-266). No right M2 proximal branch occlusion or high-grade proximal stenosis is identified. The anterior cerebral arteries are patent. Hypoplastic left A1 segment. Posterior circulation: The intracranial vertebral arteries are patent. The basilar artery is patent. The posterior cerebral arteries are patent. The posterior communicating arteries are hypoplastic or absent bilaterally. Venous sinuses: Within limitations of contrast timing, no convincing thrombus. Anatomic variants: As described Review of the MIP  images confirms the above findings These results were called by telephone at the time of interpretation on 07/03/2020 at 7:37 pm to provider Dr. Thomasena Edis, who verbally acknowledged these results. IMPRESSION: CTA neck: 1. The left ICA becomes occluded shortly beyond its origin and remains occluded throughout the remainder of the neck. 2. High-grade near occlusive stenosis within  the proximal right ICA with radiographic string sign at this site. 3. The vertebral arteries are patent within the neck bilaterally. Severe atherosclerotic narrowing at the origin of the left vertebral artery CTA head: 1. There is reconstitution of the intracranial left ICA at the level of the cavernous segment. However, this vessel remains asymmetrically diminutive. Superimposed moderate/severe atherosclerotic stenosis within the distal cavernous/paraclinoid segment. 2. 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 3. Calcified plaque within the intracranial right ICA with up to moderate stenosis within the cavernous segment. Electronically Signed   By: Jackey Loge DO   On: 07/03/2020 19:38   CT ANGIO NECK CODE STROKE  Result Date: 07/03/2020 CLINICAL DATA:  Neuro deficit, acute, stroke suspected. Facial droop, slurred speech. EXAM: CT ANGIOGRAPHY HEAD AND NECK TECHNIQUE: Multidetector CT imaging of the head and neck was performed using the standard protocol during bolus administration of intravenous contrast. Multiplanar CT image reconstructions and MIPs were obtained to evaluate the vascular anatomy. Carotid stenosis measurements (when applicable) are obtained utilizing NASCET criteria, using the distal internal carotid diameter as the denominator. CONTRAST:  Administered contrast not known at this time. COMPARISON:  Noncontrast head CT performed earlier the same day. FINDINGS: CTA NECK FINDINGS Aortic arch: Common origin of the innominate and left common carotid arteries. Atherosclerotic plaque within the visualized aortic arch and proximal major branch vessels of the neck. No hemodynamically significant innominate or proximal subclavian artery stenosis. Right carotid system: The CCA is patent to the bifurcation without stenosis. There is prominent mixed plaque within the proximal ICA with high-grade stenosis and radiographic string sign at this site. Distal to this, the ICA is patent within the neck  without significant stenosis Left carotid system: The CCA is patent to the bifurcation without stenosis. Mixed plaque within the proximal ICA. The cervical left ICA becomes occluded shortly beyond its origin and remains occluded throughout the remainder of the neck. Vertebral arteries: The vertebral arteries are patent within the neck. Mixed plaque results in high-grade stenosis at the origin of the left vertebral artery. No significant stenosis within the cervical right vertebral artery. Skeleton: No acute bony abnormality or aggressive osseous lesion. Other neck: No neck mass or cervical lymphadenopathy. Subcentimeter thyroid nodules not meeting consensus criteria for ultrasound follow-up. Upper chest: No consolidation within the imaged lung apices. Review of the MIP images confirms the above findings CTA HEAD FINDINGS Anterior circulation: There is reconstitution of flow within the left ICA at the level of the cavernous segment. However, this vessel remains asymmetrically diminutive in caliber. Additionally, calcified plaque results in moderate/severe stenosis of the distal cavernous/paraclinoid segment. The intracranial right ICA is patent. Calcified plaque within this vessel. Up to moderate stenosis within the cavernous segment. The M1 middle cerebral arteries are patent without significant stenosis. There is occlusion of a proximal M2 left MCA branch vessel (series 11, image 28) (series 6, images 264-266). No right M2 proximal branch occlusion or high-grade proximal stenosis is identified. The anterior cerebral arteries are patent. Hypoplastic left A1 segment. Posterior circulation: The intracranial vertebral arteries are patent. The basilar artery is patent. The posterior cerebral arteries are patent. The posterior communicating arteries  are hypoplastic or absent bilaterally. Venous sinuses: Within limitations of contrast timing, no convincing thrombus. Anatomic variants: As described Review of the MIP images  confirms the above findings These results were called by telephone at the time of interpretation on 07/03/2020 at 7:37 pm to provider Dr. Theda Sers, who verbally acknowledged these results. IMPRESSION: CTA neck: 1. The left ICA becomes occluded shortly beyond its origin and remains occluded throughout the remainder of the neck. 2. High-grade near occlusive stenosis within the proximal right ICA with radiographic string sign at this site. 3. The vertebral arteries are patent within the neck bilaterally. Severe atherosclerotic narrowing at the origin of the left vertebral artery CTA head: 1. There is reconstitution of the intracranial left ICA at the level of the cavernous segment. However, this vessel remains asymmetrically diminutive. Superimposed moderate/severe atherosclerotic stenosis within the distal cavernous/paraclinoid segment. 2. 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 3. Calcified plaque within the intracranial right ICA with up to moderate stenosis within the cavernous segment. Electronically Signed   By: Kellie Simmering DO   On: 07/03/2020 19:38    Assessment/Plan: [facility] CVA S/P bilateral common carotid arteriograms followed by complete revascularization of Lt MCA M1 occlusion with x 1 pass wth 72mm x 15mm embotrap retriever and aspiration achieving a TICI 3 revascularization.. S/P revascularization of symptomatic  acute occlusion of Lt ICA prox with stent assisted angioplasty. Interval/progression of hemorrhage but looks better clinically than imaging would suggest. D/w neuro and PCCM, goal for extubation today.   LOS: 2 days   I spent a total of 15 minutes in face to face in clinical consultation, greater than 50% of which was counseling/coordinating care for [facility] CVA s/p intervention  Ascencion Dike PA-C 07/05/2020 9:21 AM

## 2020-07-05 NOTE — Procedures (Signed)
Extubation Procedure Note  Patient Details:   Name: ELAF CLAUSON DOB: 21-Oct-1940 MRN: [id number]   Airway Documentation:    Vent end date: 07/05/20 Vent end time: 1102   Evaluation  O2 sats: stable throughout Complications: No apparent complications Patient did tolerate procedure well. Bilateral Breath Sounds: Clear, Diminished   Patient extubated per MD order & placed on 4L [place]. Patient tolerated well. Patient attempts to cough when asked but effort is not great.  Jacqulynn Cadet 07/05/2020, 11:07 AM

## 2020-07-05 NOTE — Progress Notes (Signed)
SLP Cancellation Note  Patient Details Name: Sharon Gilbert MRN: [id number] DOB: 07/14/1941   Cancelled treatment:       Reason Eval/Treat Not Completed: Patient not medically ready. Pt intubated, will f/u for readiness   Carmita Boom, Riley Nearing 07/05/2020, 10:40 AM

## 2020-07-05 NOTE — Progress Notes (Signed)
STROKE TEAM PROGRESS NOTE   INTERVAL HISTORY Patient remains intubated for respiratory failure.  She is off sedation.  She can be aroused.  She follows simple midline commands.  She has mild right hemiparesis.  Blood pressure adequately controlled.  Repeat CT scan of the head this morning shows increase in subarachnoid hemorrhage which now extends up to the prepontine cisterns.  Cytotoxic edema from insular cortex and lateral frontal region compatible with MCA branch infarct  Vitals:   07/05/20 0600 07/05/20 0607 07/05/20 0612 07/05/20 0615  BP: (!) 192/160 (!) 79/39 (!) 94/41 (!) 126/48  Pulse: 78 (!) 52 (!) 46 (!) 37  Resp: Temp:      TempSrc:      SpO2: 100% 100% 100% 100%  Weight:      Height:       CBC:  Recent Labs  Lab 07/03/20 1843 07/03/20 1850 07/03/20 2151 07/03/20 2151 07/04/20 2009 07/05/20 0507  WBC 6.2   < > 6.2  --   --  9.1  NEUTROABS 2.8  --   --   --   --  7.5  HGB 12.9   < > 14.4   < > 11.6* 10.0*  HCT 39.6   < > 43.8   < > 34.0* 31.6*  MCV 86.1   < > 87.8  --   --  88.8  PLT 299   < > 316  --   --  286   < > = values in this interval not displayed.   Basic Metabolic Panel:  Recent Labs  Lab 07/03/20 1843 07/03/20 1843 07/03/20 1850 07/03/20 1850 07/03/20 2151 07/04/20 2009 07/05/20 0507  NA 135   < > 136   < >  --  141 139  K 3.4*   < > 3.3*   < >  --  3.1* 3.2*  CL 101   < > 103  --   --   --  111  CO2 20*  --   --   --   --   --  20*  GLUCOSE 121*   < > 116*  --   --   --  150*  BUN 15   < > 15  --   --   --  6*  CREATININE 1.02*   < > 1.00   < > 1.00  --  0.69  CALCIUM 9.4  --   --   --   --   --  8.4*   < > = values in this interval not displayed.   Lipid Panel:  Recent Labs  Lab 07/04/20 0738 07/04/20 0738 07/05/20 0507  CHOL 275*  --   --   TRIG 136   < > 156*  HDL 46  --   --   CHOLHDL 6.0  --   --   VLDL 27  --   --   LDLCALC 161*  --   --    < > = values in this interval not displayed.   HgbA1c:  Recent  Labs  Lab 07/04/20 0738  HGBA1C 6.1*   Urine Drug Screen: No results for input(s): LABOPIA, COCAINSCRNUR, LABBENZ, AMPHETMU, THCU, LABBARB in the last 168 hours.  Alcohol Level No results for input(s): ETH in the last 168 hours.  IMAGING past 24 hours CT HEAD WO CONTRAST  Addendum Date: 07/04/2020   ADDENDUM REPORT: 07/04/2020 13:16 ADDENDUM: Code stroke imaging results were communicated on 07/04/2020 at 1:16 pm  to provider [facility] via secure text paging. Electronically Signed   By: Marlan Palau M.D.   On: 07/04/2020 13:16   Result Date: 07/04/2020 CLINICAL DATA:  Stroke follow-up EXAM: CT HEAD WITHOUT CONTRAST TECHNIQUE: Contiguous axial images were obtained from the base of the skull through the vertex without intravenous contrast. COMPARISON:  CT angio head and neck and MRI head 07/03/2020 FINDINGS: Brain: New area of hypodensity in the left insula and left operculum compatible with acute infarct not seen on prior CT or MRI. Additional small areas of acute infarct in the left frontal and parietal cortex compatible with acute infarction best seen on MRI. No acute hemorrhage or mass.  Ventricle size normal. Vascular: Possible hyperdensity left M2 branch which is more apparent on the current study and may be new. Skull: Negative Sinuses/Orbits: Paranasal sinuses clear.  Negative orbit Other: None IMPRESSION: Interval progression of infarct now vomiting the left insula and operculum not seen on the prior MRI yesterday. No associated hemorrhage. In addition, there appears to be possible hyperdensity in the left MCA branch in the sylvian fissure which could be due to new thrombus. Electronically Signed: By: Marlan Palau M.D. On: 07/04/2020 12:55   Portable Chest xray  Result Date: 07/05/2020 CLINICAL DATA:  Endotracheal tube placement EXAM: PORTABLE CHEST 1 VIEW COMPARISON:  07/04/2020 FINDINGS: Endotracheal tube tip measures 5.2 cm above the carina. Enteric tube tip is off the field of view but below  the left hemidiaphragm. Shallow inspiration with probable atelectasis in the right base. Heart size and pulmonary vascularity are normal. No pleural effusions. No pneumothorax. Mediastinal contours appear intact. Degenerative changes in the spine and shoulders. IMPRESSION: Appliances appear in satisfactory position. Shallow inspiration with atelectasis in the right base. Electronically Signed   By: Burman Nieves M.D.   On: 07/05/2020 05:22   Portable Chest x-ray  Result Date: 07/04/2020 CLINICAL DATA:  Intubated EXAM: PORTABLE CHEST 1 VIEW COMPARISON:  None. FINDINGS: Endotracheal tube tip about 4 cm superior to the carina. Esophageal tube tip is below the diaphragm but is incompletely visualized. No focal opacity or pleural effusion. Normal heart size. Aortic atherosclerosis. No pneumothorax. IMPRESSION: Endotracheal tube tip about 4 cm superior to the carina. No acute pulmonary infiltrate. Electronically Signed   By: Jasmine Pang M.D.   On: 07/04/2020 20:02   DG Abd Portable 1V  Result Date: 07/04/2020 CLINICAL DATA:  OG tube placement EXAM: PORTABLE ABDOMEN - 1 VIEW COMPARISON:  None. FINDINGS: Esophageal tube tip overlies the gastric fundus, side-port in the region of GE junction. Upper gas pattern is unremarkable IMPRESSION: Esophageal tube tip overlies the gastric fundus with side-port at the GE junction, consider further advancement for more optimal positioning. Electronically Signed   By: Jasmine Pang M.D.   On: 07/04/2020 20:03   ECHOCARDIOGRAM COMPLETE  Result Date: 07/04/2020    ECHOCARDIOGRAM REPORT   Patient Name:   RALYN STLAURENT Date of Exam: 07/04/2020 Medical Rec #:  [id number]       Height:       65.0 in Accession #:    [id number]      Weight:       177.5 lb Date of Birth:  [birth year]       BSA:          1.880 m Patient Age:    78 years        BP:           210/168 mmHg Patient Gender: F  HR:           74 bpm. Exam Location:  Inpatient Procedure: 2D Echo, Cardiac  Doppler, Color Doppler and Intracardiac            Opacification Agent Indications:    Stroke 434.91 / I163.9  History:        Patient has no prior history of Echocardiogram examinations.                 Risk Factors:Diabetes.  Sonographer:    Tiffany Dance Referring Phys: [id number] [facility] KHALIQDINA IMPRESSIONS  1. Left ventricular ejection fraction, by estimation, is 70 to 75%. The left ventricle has hyperdynamic function. The left ventricle has no regional wall motion abnormalities. Left ventricular diastolic parameters are consistent with Grade I diastolic dysfunction (impaired relaxation).  2. Right ventricular systolic function is normal. The right ventricular size is normal.  3. The mitral valve is normal in structure. No evidence of mitral valve regurgitation. No evidence of mitral stenosis.  4. Tricuspid valve regurgitation is moderate.  5. The aortic valve is tricuspid. Aortic valve regurgitation is not visualized. Mild to moderate aortic valve sclerosis/calcification is present, without any evidence of aortic stenosis.  6. The inferior vena cava is normal in size with greater than 50% respiratory variability, suggesting right atrial pressure of 3 mmHg. Conclusion(s)/Recommendation(s): No intracardiac source of embolism detected on this transthoracic study. A transesophageal echocardiogram is recommended to exclude cardiac source of embolism if clinically indicated. FINDINGS  Left Ventricle: Left ventricular ejection fraction, by estimation, is 70 to 75%. The left ventricle has hyperdynamic function. The left ventricle has no regional wall motion abnormalities. The left ventricular internal cavity size was normal in size. There is no left ventricular hypertrophy. Left ventricular diastolic parameters are consistent with Grade I diastolic dysfunction (impaired relaxation). Right Ventricle: The right ventricular size is normal. No increase in right ventricular wall thickness. Right ventricular systolic  function is normal. Left Atrium: Left atrial size was normal in size. Right Atrium: Right atrial size was normal in size. Pericardium: There is no evidence of pericardial effusion. Mitral Valve: The mitral valve is normal in structure. No evidence of mitral valve regurgitation. No evidence of mitral valve stenosis. Tricuspid Valve: The tricuspid valve is normal in structure. Tricuspid valve regurgitation is moderate . No evidence of tricuspid stenosis. Aortic Valve: The aortic valve is tricuspid. Aortic valve regurgitation is not visualized. Mild to moderate aortic valve sclerosis/calcification is present, without any evidence of aortic stenosis. Pulmonic Valve: The pulmonic valve was normal in structure. Pulmonic valve regurgitation is not visualized. No evidence of pulmonic stenosis. Aorta: The aortic root is normal in size and structure. Venous: The inferior vena cava is normal in size with greater than 50% respiratory variability, suggesting right atrial pressure of 3 mmHg. IAS/Shunts: No atrial level shunt detected by color flow Doppler.  LEFT VENTRICLE PLAX 2D LVIDd:         3.80 cm LVIDs:         2.50 cm LV PW:         1.00 cm LV IVS:        1.00 cm LVOT diam:     1.40 cm LV SV:         42 LV SV Index:   22 LVOT Area:     1.54 cm  RIGHT VENTRICLE          IVC RV Basal diam:  2.20 cm  IVC diam: 1.20 cm TAPSE (M-mode): 1.9 cm LEFT  ATRIUM             Index       RIGHT ATRIUM           Index LA diam:        3.40 cm 1.81 cm/m  RA Area:     10.70 cm LA Vol (A2C):   57.5 ml 30.58 ml/m RA Volume:   22.20 ml  11.81 ml/m LA Vol (A4C):   33.9 ml 18.03 ml/m LA Biplane Vol: 45.1 ml 23.99 ml/m  AORTIC VALVE LVOT Vmax:   107.60 cm/s LVOT Vmean:  76.600 cm/s LVOT VTI:    0.271 m  AORTA Ao Root diam: 2.90 cm Ao Asc diam:  3.20 cm MITRAL VALVE MV Area (PHT): 2.55 cm    SHUNTS MV Decel Time: 297 msec    Systemic VTI:  0.27 m MV E velocity: 50.60 cm/s  Systemic Diam: 1.40 cm MV A velocity: 78.00 cm/s MV E/A ratio:  0.65  Donato Schultz MD Electronically signed by Donato Schultz MD Signature Date/Time: 07/04/2020/11:10:22 AM    Final    Neuro Interventional Radiology - Cerebral Angiogram with Intervention - Dr Corliss Skains 07/04/20 5:34 PM S/P bilateral common carotid arteriograms followed by complete revascularization of Lt MCA M1 occlusion with x 1 pass wth 62mm x 37mm embotrap retriever and aspiration achieving a TICI 3 revascularization.. S/P revascularization of symptomatic  acute occlusion of Lt ICA prox with stent assisted angioplasty.  Post treatment CT brain no mass effect. Mild to mod SAH (blood+contrast) in the left post  Sylvian fissure and ant parietal convexity.  PHYSICAL EXAM   Pleasant elderly African-American lady not in distress. . Afebrile. Head is nontraumatic. Neck is supple without bruit.    Cardiac exam no murmur or gallop. Lungs are clear to auscultation. Distal pulses are well felt.  She is intubated Neurological Exam : She is intubated and on ventilatory support.  She arouses easily.  And follows commands well.  Left gaze deviation.  Unable to cross midline when looking to the right.   s.  Blinks to threat more on the left than the right.  Mild right lower facial asymmetry when she smiles.  Tongue midline.  Mild right hemiparesis but able to withdraw right upper and lower extremity against gravity to noxious stimuli.  Purposeful movements on the left greater than the right.  Sensation appears intact.  Reflexes are symmetric.  Plantars downgoing.  Gait not tested  ASSESSMENT/PLAN Ms. TORIANN SPADONI is a 79 y.o. female with history of cataracts and diabetes presenting with slurred speech, mild face droop. Found to have a tandem L ICA and M2 occlusion w/ transient aphasia and RUE weakness. Admitted to neuro ICU. Did not receive tPA d/t mild sx.    Stroke:   Small L MCA infarct in setting of L ICA/M2 occlusion from large vessel disease with subsequent went worsening due to left M2 occlusion treated with  left ICA revascularization with telescopic stents and left M2 mechanical thrombectomy on 07/04/2020.  Postprocedure subarachnoid hemorrhage on the left   .  Code Stroke CT head No acute abnormality. Mild cerebral Atrophy. ASPECTS 10.     CTA head reconstitution of intracranial L ICA at cavernous level but diminuative. Superimposed moderate/severe atherosclerosis distal cavernous/paraclinoid segment.  Proximal L M2 occlusion. Intracranial R ICA w/ moderate stenosis cavernous segment.  CTA neck L ICA occlusion beyond origin and remains occluded through neck. High-grade near occlusive stenosis proximal R ICA w/ string sign. L VA origin w/  severe atherosclerosis narrowing.   MRI  Multiple small L frontal and parietal lobe infarcts. L ICA signal anbormality distal cervical and precavernous c/w occlusion. Proximal L M2 branch occlusion not well seen.  CT Head - 07/05/20 -increased subarachnoid hemorrhage which now extends into the basal cisterns.  Cytotoxic edema in the left insula and lateral frontal lobe   MRI - 07/03/20 -multiple small acute infarcts in left frontal and parietal lobes and left MCA territory  CT perfusion 84mL penumbra L frontal operculum w/o  Core  Ct head 9/29 (new R hand weakness) interval progression L insula & opercular infarct. No hemorrhage. Possible L MCA hyperdensity, ? New thrombus.  Cerebral angio / IR - 07/04/20 - S/P bilateral common carotid arteriograms followed by complete revascularization of Lt MCA M1 occlusion with x 1 pass wth 66mm x 12mm embotrap retriever and aspiration achieving a TICI 3 revascularization. S/P revascularization of symptomatic  acute occlusion of Lt ICA prox with stent assisted angioplasty.   2D Echo - EF 70 - 75%. No cardiac source of emboli identified.   LDL 202   HgbA1c 6.1   P2Y12 19   VTE prophylaxis - Lovenox 40 mg sq daily   aspirin 81 mg daily prior to admission, now on aspirin 81 mg daily and Brilinta (ticagrelor) 90 mg bid  following Brilinta load.    Therapy recommendations:  pending   Disposition:  pending   Keep in bed today  Bilateral Carotid Stenosis   CTA neck - L ICA occlusion beyond origin and remains occluded through neck. High-grade near occlusive stenosis proximal R ICA w/ string sign. L VA origin w/ severe atherosclerosis narrowing.   Cerebral angio / IR - 07/04/20 - S/P bilateral common carotid arteriograms followed by complete revascularization of Lt MCA M1 occlusion with x 1 pass wth 57mm x 47mm embotrap retriever and aspiration achieving a TICI 3 revascularization. S/P revascularization of symptomatic  acute occlusion of Lt ICA prox with stent assisted angioplasty.   Hypertension  Home meds:  Lisinopril-HCTZ 20-12.5 daily  Labile BP  - 192/160 -> 74/39 . Permissive hypertension (OK if < 220/120) but gradually normalize in 5-7 days . Long-term BP goal normotensive  Hyperlipidemia    Home meds:  None  LDL 202, goal < 70  Add lipitor 80  Continue statin at discharge  Pre-Diabetes   Home meds:  none  HgbA1c 6.1, goal < 7.0  Dysphagia . Secondary to stroke . NPO . Coughing w/ pos . Speech on board  Other Stroke Risk Factors  Advanced age  Obesity, Body mass index is 29.53 kg/m., recommend weight loss, diet and exercise as appropriate   Other Active Problems  Chronic right arm and upper back stiffness likely of musculoskeletal etiology as well as right hand paresthesias likely from carpal tunnel syndrome. Put on topamax. - seen by Pearlean Brownie as an OP last week - EMG pending  Bradycardia - 30's - 40's - 50's - 70's  (currently not on medications that should affect heart rate)   Hospital day # 2  Patient underwent emergent left carotid artery angioplasty stenting along with successful mechanical thrombectomy of left M2 procedure resulted in some subarachnoid hemorrhage.  Recommend close neurological monitoring and strict blood pressure control with systolic goal between  120-140.  Continue vasopressor and Cleviprex to maintain this goal.  Wean off ventilatory support and extubate as tolerated per CCM.  Continue aspirin and Brilinta as she has had a fresh carotid stent.  Long discussion over the phone with  the patient's son Mikeal Hawthorne and answered questions about her care.  I also discussed with her other son at the bedside.  Discussed with Dr. Kendrick Fries critical care medicine.  And Dr. Corliss Skains. This patient is critically ill and at significant risk of neurological worsening, death and care requires constant monitoring of vital signs, hemodynamics,respiratory and cardiac monitoring, extensive review of multiple databases, frequent neurological assessment, discussion with family, other specialists and medical decision making of high complexity.I have made any additions or clarifications directly to the above note.This critical care time does not reflect procedure time, or teaching time or supervisory time of PA/NP/Med Resident etc but could involve care discussion time.  I spent 40 minutes of neurocritical care time  in the care of  this patient.  Delia Heady, MD       To contact Stroke Continuity provider, please refer to [email or web address]. After hours, contact General Neurology

## 2020-07-06 DIAGNOSIS — I6602 Occlusion and stenosis of left middle cerebral artery: Secondary | ICD-10-CM

## 2020-07-06 DIAGNOSIS — J988 Other specified respiratory disorders: Secondary | ICD-10-CM

## 2020-07-06 DIAGNOSIS — I63232 Cerebral infarction due to unspecified occlusion or stenosis of left carotid arteries: Secondary | ICD-10-CM | POA: Diagnosis not present

## 2020-07-06 LAB — BASIC METABOLIC PANEL
Anion gap: 10 (ref 5–15)
BUN: 8 mg/dL (ref 8–23)
CO2: 19 mmol/L — ABNORMAL LOW (ref 22–32)
Calcium: 8.7 mg/dL — ABNORMAL LOW (ref 8.9–10.3)
Chloride: 111 mmol/L (ref 98–111)
Creatinine, Ser: 0.84 mg/dL (ref 0.44–1.00)
GFR calc Af Amer: >60 mL/min (ref >60)
GFR calc non Af Amer: >60 mL/min (ref >60)
Glucose, Bld: 105 mg/dL — ABNORMAL HIGH (ref 70–99)
Potassium: 3.5 mmol/L (ref 3.5–5.1)
Sodium: 140 mmol/L (ref 135–145)

## 2020-07-06 LAB — CBC
HCT: 30.2 % — ABNORMAL LOW (ref 36.0–46.0)
Hemoglobin: 10 g/dL — ABNORMAL LOW (ref 12.0–15.0)
MCH: 29 pg (ref 26.0–34.0)
MCHC: 33.1 g/dL (ref 30.0–36.0)
MCV: 87.5 fL (ref 80.0–100.0)
Platelets: 253 10*3/uL (ref 150–400)
RBC: 3.45 MIL/uL — ABNORMAL LOW (ref 3.87–5.11)
RDW: 14.1 % (ref 11.5–15.5)
WBC: 8.6 10*3/uL (ref 4.0–10.5)
nRBC: 0 % (ref 0.0–0.2)

## 2020-07-06 LAB — TRIGLYCERIDES: Triglycerides: 112 mg/dL (ref <150)

## 2020-07-06 MED ORDER — HYDRALAZINE HCL 20 MG/ML IJ SOLN
20.0000 mg | Freq: Four times a day (QID) | INTRAMUSCULAR | Status: DC | PRN
  Filled 2020-07-06: qty 1

## 2020-07-06 MED ORDER — LABETALOL HCL 5 MG/ML IV SOLN
20.0000 mg | INTRAVENOUS | Status: DC | PRN
  Administered 2020-07-06 (×2): 20 mg via INTRAVENOUS
  Filled 2020-07-06 (×2): qty 4

## 2020-07-06 MED ORDER — DORZOLAMIDE HCL-TIMOLOL MAL 2-0.5 % OP SOLN
1.0000 [drp] | Freq: Two times a day (BID) | OPHTHALMIC | Status: DC
  Administered 2020-07-06 – 2020-07-11 (×11): 1 [drp] via OPHTHALMIC
  Filled 2020-07-06 (×2): qty 10

## 2020-07-06 MED ORDER — LATANOPROST 0.005 % OP SOLN
1.0000 [drp] | Freq: Every day | OPHTHALMIC | Status: DC
  Administered 2020-07-06 – 2020-07-10 (×5): 1 [drp] via OPHTHALMIC
  Filled 2020-07-06 (×2): qty 2.5

## 2020-07-06 MED ORDER — LABETALOL HCL 5 MG/ML IV SOLN: 20.0000 mg | INTRAVENOUS | Status: DC | PRN

## 2020-07-06 MED ORDER — HYDRALAZINE HCL 20 MG/ML IJ SOLN: 20.0000 mg | Freq: Four times a day (QID) | INTRAMUSCULAR | Status: DC | PRN

## 2020-07-06 NOTE — Evaluation (Signed)
Physical Therapy Evaluation Patient Details Name: Sharon Gilbert MRN: [id number] DOB: [birth year] Today's Date: 07/06/2020   History of Present Illness  This 79 y.o. female admitted with slurred speech and mild facial droop. 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  She underwent Lt M2 revasularization with telescopic stents and M2 mechanical thrombectomy on 07/04/2020.  Post procudrue she developed Lt SAH.   MRI showed multiple Lt frontal and parietal lobe infarcts.  Repeat CT of head on 9/30 showed increased SAH which extends into the basal cisterns, as well as cytotoxic edema Lt insula and Lt frontal lobes.  PMH includes:  HTN, cataracts   Clinical Impression  Pt presents to PT with deficits in functional mobility, gait, balance, communication, strength, power, awareness. Pt with significant aphasia, making multi-step command following difficult during session. Pt requires physical assistance for all OOB mobility due to impaired balance this session. Pt with significant RUE weakness at this time impairing use of an assistive device with this arm. Pt will benefit from continued acute PT POC to improve mobility quality and reduce falls risk. PT recommends CIR placement at this time as the pt was very independent prior to admission and demonstrates the potential to make significant functional gains with high intensity inpatient PT services.    Follow Up Recommendations CIR    Equipment Recommendations   (TBD)    Recommendations for Other Services Rehab consult     Precautions / Restrictions Precautions Precautions: Fall Restrictions Weight Bearing Restrictions: No      Mobility  Bed Mobility Overal bed mobility: Needs Assistance Bed Mobility: Supine to Sit     Supine to sit: Min assist     General bed mobility comments: cues and min A to move Lt UE to EOB, and assist for trunk   Transfers Overall transfer level: Needs  assistance Equipment used: 1 person hand held assist Transfers: Sit to/from [employer] Sit to Stand: Min assist Stand pivot transfers: Min assist       General transfer comment: assist to boost into standing and assist for balance   Ambulation/Gait Ambulation/Gait assistance: Mod assist;+2 physical assistance Gait Distance (Feet): 5 Feet Assistive device: 2 person hand held assist Gait Pattern/deviations: Step-to pattern;Wide base of support;Drifts right/left Gait velocity: reduced Gait velocity interpretation: <1.31 ft/sec, indicative of household ambulator General Gait Details: pt with one significant loss of balance requiring modA x2 to correct, increased postural sway and short step length  Stairs            Wheelchair Mobility    Modified Rankin (Stroke Patients Only) Modified Rankin (Stroke Patients Only) Pre-Morbid Rankin Score: No symptoms Modified Rankin: Moderately severe disability     Balance Overall balance assessment: Needs assistance Sitting-balance support: Feet supported Sitting balance-Leahy Scale: Fair Sitting balance - Comments: minG for dynamic sitting balance   Standing balance support: Single extremity supported Standing balance-Leahy Scale: Poor Standing balance comment: requires min A                              Pertinent Vitals/Pain Pain Assessment: Faces Faces Pain Scale: No hurt    Home Living Family/patient expects to be discharged to:: Private residence Living Arrangements: Alone Available Help at Discharge:  (uncertain) Type of Home: House Home Access: Stairs to enter Entrance Stairs-Rails: Lawyer of Steps: 5 Home Layout: One level Home Equipment: None Additional Comments: pt  indicates that her son lives with her     Prior Function Level of Independence: Independent         Comments: Per RN, 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   beginning Mass grasp noted with ~10* AROM noted  RUE Sensation:  (difficult to accurately assess ) RUE Coordination: decreased fine motor;decreased gross motor    Lower Extremity Assessment Lower Extremity Assessment: Defer to PT evaluation RLE Deficits / Details: grossly 4/5, mild knee buckling noted during ambulation resulting in one LOB RLE Sensation:  (difficult to assess 2/2 communication deficits) LLE Deficits / Details: grossly 4+/5    Cervical / Trunk Assessment Cervical / Trunk Assessment: Normal  Communication   Communication: Expressive difficulties;Receptive difficulties  Cognition Arousal/Alertness: Awake/alert Behavior During Therapy: WFL for tasks assessed/performed Overall Cognitive Status: Difficult to assess                                 General Comments: Pt follows simple one step commands  consistetently, and 2 step commands inconsistently when gestural cues provided.   Perseveration noted.  She demonstrates good awareness of Rt UE weakness, but does not seem to have good awareness of the severity of her expressive communication deficits       General Comments General comments (skin integrity, edema, etc.): VSS     Exercises     Assessment/Plan    PT Assessment Patient needs continued PT services  PT Problem List Decreased strength;Decreased activity tolerance;Decreased balance;Decreased mobility;Decreased cognition;Decreased safety awareness;Decreased knowledge of use of DME;Decreased knowledge of precautions       PT Treatment Interventions DME instruction;Gait training;Stair training;Functional mobility training;Therapeutic activities;Therapeutic exercise;Balance training;Neuromuscular  re-education;Cognitive remediation;Patient/family education    PT Goals (Current goals can be found in the Care Plan section)  Acute Rehab PT Goals Patient Stated Goal: Pt unable to state  PT Goal Formulation: Patient unable to participate in goal setting Time For Goal Achievement: 07/20/20 Potential to Achieve Goals: Good    Frequency Min 4X/week   Barriers to discharge        Co-evaluation PT/OT/SLP Co-Evaluation/Treatment: Yes Reason for Co-Treatment: For patient/therapist safety;To address functional/ADL transfers PT goals addressed during session: Mobility/safety with mobility;Balance;Strengthening/ROM OT goals addressed during session: ADL's and self-care       AM-PAC PT "6 Clicks" Mobility  Outcome Measure Help needed turning from your back to your side while in a flat bed without using bedrails?: A Little Help needed moving from lying on your back to sitting on the side of a flat bed without using bedrails?: A Little Help needed moving to and from a bed to a chair (including a wheelchair)?: A Lot Help needed standing up from a chair using your arms (e.g., wheelchair or bedside chair)?: A Little Help needed to walk in hospital room?: A Lot Help needed climbing 3-5 steps with a railing? : Total 6 Click Score: 14    End of Session   Activity Tolerance: Patient tolerated treatment well Patient left: in chair;with call bell/phone within reach;with chair alarm set Nurse Communication: Mobility status PT Visit Diagnosis: Unsteadiness on feet (R26.81);Other abnormalities of gait and mobility (R26.89);Other symptoms and signs involving the nervous  system (R29.898);Muscle weakness (generalized) (M62.81)    Time: 2025-4270 PT Time Calculation (min) (ACUTE ONLY): 32 min   Charges:   PT Evaluation $PT Eval Moderate Complexity: 1 Mod          Arlyss Gandy, PT, DPT Acute Rehabilitation Pager: [phone]   Arlyss Gandy 07/06/2020, 2:08 PM

## 2020-07-06 NOTE — Progress Notes (Signed)
[name] changed SBP goal to <160.

## 2020-07-06 NOTE — Evaluation (Signed)
Occupational Therapy Evaluation Patient Details Name: Sharon Gilbert MRN: [id number] DOB: 03/13/1941 Today's Date: 07/06/2020    History of Present Illness This 79 y.o. female admitted with slurred speech and mild facial droop. 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  She underwent Lt M2 revasularization with telescopic stents and M2 mechanical thrombectomy on 07/04/2020.  Post procudrue she developed Lt SAH.   MRI showed multiple Lt frontal and parietal lobe infarcts.  Repeat CT of head on 9/30 showed increased SAH which extends into the basal cisterns, as well as cytotoxic edema Lt insula and Lt frontal lobes.  PMH includes:  HTN, cataracts    Clinical Impression   Pt admitted with above. She demonstrates the below listed deficits and will benefit from continued OT to maximize safety and independence with BADLs.  Pt presents to OT with Rt hemiparesis, impaired balance, visual/perceptual deficits, ? Ideational apraxia, perseveration, and decreased awareness of deficits as well as significant communication deficits.  She currently requires min - max A for ADLs and mod A +2 for safety with functional ambulation.   PTA, pt was independent.  Recommend CIR level rehab.       Follow Up Recommendations  CIR;Supervision/Assistance - 24 hour    Equipment Recommendations  None recommended by OT    Recommendations for Other Services Rehab consult     Precautions / Restrictions Precautions Precautions: Fall      Mobility Bed Mobility Overal bed mobility: Needs Assistance Bed Mobility: Supine to Sit     Supine to sit: Min assist     General bed mobility comments: cues and min A to move Lt UE to EOB, and assist for trunk   Transfers Overall transfer level: Needs assistance Equipment used: 1 person hand held assist Transfers: Sit to/from [employer] Sit to Stand: Min assist Stand pivot transfers: Min assist        General transfer comment: assist to boost into standing and assist for balance     Balance Overall balance assessment: Needs assistance Sitting-balance support: Feet supported Sitting balance-Leahy Scale: Fair     Standing balance support: Single extremity supported Standing balance-Leahy Scale: Poor Standing balance comment: requires min A                            ADL either performed or assessed with clinical judgement   ADL Overall ADL's : Needs assistance/impaired Eating/Feeding: Minimal assistance;Sitting   Grooming: Wash/dry hands;Wash/dry face;Oral care;Brushing hair;Moderate assistance;Sitting   Upper Body Bathing: Moderate assistance;Sitting   Lower Body Bathing: Moderate assistance;Sit to/from stand   Upper Body Dressing : Moderate assistance;Sitting   Lower Body Dressing: Maximal assistance;Sit to/from stand Lower Body Dressing Details (indicate cue type and reason): Pt able to doff socks with close min guard assist in supported sitting, and was able to don Rt sock with min A.  She went through the motions of preparing the sock to don it on the Lt sock, however, there was nothing in her hands.  She did this repetitively with no awareness.  When cued, she was able to locate her sock, and she donnned it with mod A  Toilet Transfer: Moderate assistance;+2 for safety/equipment;Stand-pivot;BSC   Toileting- Clothing Manipulation and Hygiene: Moderate assistance;Sit to/from stand       Functional mobility during ADLs: Moderate assistance;+2 for safety/equipment       Vision Baseline Vision/History: Cataracts Vision Assessment?: Yes Eye  Alignment: Within Functional Limits Ocular Range of Motion: Within Functional Limits Alignment/Gaze Preference: Within Defined Limits Tracking/Visual Pursuits: Able to track stimulus in all quads without difficulty Additional Comments: Pt indicates she wears glasses, but is unable to provide details.  with cues and  prompting, she was able to follow commands to assess pursuits.  Attempted to assess visual fields, however, pt provided inconsistent responses, especially on the Rt, and demonstrated significant difficulty following the instructions (likely secondary to communication deficits      Perception Perception Perception Tested?: Yes Perception Deficits: Inattention/neglect Inattention/Neglect: Does not attend to right side of body Comments: possible Rt visual inattention - to be further assessed    Praxis Praxis Praxis tested?: Deficits Deficits: Ideomotor    Pertinent Vitals/Pain Pain Assessment: Faces Faces Pain Scale: No hurt     Hand Dominance Right   Extremity/Trunk Assessment Upper Extremity Assessment RUE Deficits / Details: Pt demonstrates ~70* shoulder flexion and elbow flex/extension actively.   beginning Mass grasp noted with ~10* AROM noted  RUE Sensation:  (difficult to accurately assess ) RUE Coordination: decreased fine motor;decreased gross motor   Lower Extremity Assessment Lower Extremity Assessment: Defer to PT evaluation       Communication Communication Communication: Expressive difficulties;Receptive difficulties   Cognition Arousal/Alertness: Awake/alert Behavior During Therapy: WFL for tasks assessed/performed Overall Cognitive Status: Difficult to assess                                 General Comments: Pt follows simple one step commands  consistetently, and 2 step commands inconsistently when gestural cues provided.   Perseveration noted.  She demonstrates good awareness of Rt UE weakness, but does not seem to have good awareness of the severity of her expressive communication deficits    General Comments  VSS     Exercises     Shoulder Instructions      Home Living Family/patient expects to be discharged to:: Private residence Living Arrangements: Alone Available Help at Discharge:  (uncertain) Type of Home: House Home Access:  Stairs to enter [employer] of Steps: 5 Entrance Stairs-Rails: Left;Right Home Layout: One level                   Additional Comments: pt indicates that her son lives with her       Prior Functioning/Environment Level of Independence: Independent        Comments: Per RN, 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         OT Problem List: Decreased strength;Decreased range of motion;Decreased activity tolerance;Impaired balance (sitting and/or standing);Impaired vision/perception;Decreased coordination;Decreased cognition;Decreased safety awareness;Decreased knowledge of use of DME or AE;Impaired UE functional use      OT Treatment/Interventions: Self-care/ADL training;Neuromuscular education;DME and/or AE instruction;Manual therapy;Splinting;Therapeutic activities;Cognitive remediation/compensation;Visual/perceptual remediation/compensation;Patient/family education;Balance training    OT Goals(Current goals can be found in the care plan section) Acute Rehab OT Goals Patient Stated Goal: Pt unable to state  OT Goal Formulation: With patient Time For Goal Achievement: 07/20/20 Potential to Achieve Goals: Good ADL Goals Pt Will Perform Eating: with modified independence;sitting Pt Will Perform Grooming: with min guard assist;standing Pt Will Perform Upper Body Bathing: with supervision;with set-up;sitting Pt Will Perform Lower Body Bathing: with min assist;sit to/from stand Pt Will Perform Upper Body Dressing: with min assist;sitting Pt Will Perform Lower Body Dressing: with min guard assist;sit to/from stand Pt Will Transfer to Toilet: with min assist;ambulating;regular height  toilet;bedside commode;grab bars Pt Will Perform Toileting - Clothing Manipulation and hygiene: with min guard assist;sit to/from stand Additional ADL Goal #1: Will locate ADL items on the Rt with min cues.  OT Frequency: Min 2X/week   Barriers to D/C:  (uncertain )           Co-evaluation PT/OT/SLP Co-Evaluation/Treatment: Yes Reason for Co-Treatment: For patient/therapist safety;To address functional/ADL transfers   OT goals addressed during session: ADL's and self-care      AM-PAC OT "6 Clicks" Daily Activity     Outcome Measure Help from another person eating meals?: A Little Help from another person taking care of personal grooming?: A Lot Help from another person toileting, which includes using toliet, bedpan, or urinal?: A Lot Help from another person bathing (including washing, rinsing, drying)?: A Lot Help from another person to put on and taking off regular upper body clothing?: A Lot Help from another person to put on and taking off regular lower body clothing?: A Lot 6 Click Score: 13   End of Session Nurse Communication: Mobility status  Activity Tolerance: Patient tolerated treatment well Patient left: in chair;with call bell/phone within reach;with chair alarm set  OT Visit Diagnosis: Unsteadiness on feet (R26.81);Cognitive communication deficit (R41.841) Symptoms and signs involving cognitive functions: Cerebral infarction;Nontraumatic SAH                Time: 8841-6606 OT Time Calculation (min): 32 min Charges:  OT General Charges $OT Visit: 1 Visit OT Evaluation $OT Eval Moderate Complexity: 1 Mod  Eber Jones., OTR/L Acute Rehabilitation Services Pager [phone] Office [phone]   Jeani Hawking M 07/06/2020, 1:43 PM

## 2020-07-06 NOTE — Consult Note (Signed)
Reason for Consult: Stroke with functional deficits.  Referring Physician: [name].    HPI: Sharon Gilbert is a 79 y.o. RH-female with history of T2DM and glaucoma;  who was admitted on 07/03/20 with slurred speech. CTA head/neck showed occlusion of L-M2 MCA branch with  showed left ICA occlusion beyond origin, near occlusive stenosis proximal R-ICA with string sign. CT perfusion without core. Follow up MRI brain done revealing multiple small infarcts in left frontal and parietal lobe. She had developed right sided weakness overnight and underwent cerebral angio with complete revascularization of L-MCA M1 occlusion and revascularization of proximal L-ICA with stent associated angioplasty. Post op CT head showed mild ot moderate SAH and patient left intubated till 9/30. Follow up CT head showed cytotoxic edema from insular cortex and frontal region and increase in [facility]. SBP goals 120-140.  2D echo showed EF 70-75% with moderate TVR and mild to moderate aortic valve sclerosis. She was started on dysphagia 2, thins as no signs of aspiration noted. Patient with right sided weakness with right inattention and severe expressive aphasia with motor apraxia. Therapy evaluations completed and CIR recommended due to functional decline.    Review of Systems  Unable to perform ROS: Language  Respiratory: Positive for shortness of breath.   Cardiovascular: Positive for chest pain.  Neurological: Positive for speech change and weakness.     Past Medical History:  Diagnosis Date   Cataract    Diabetes mellitus without complication [facility])     Past Surgical History:  Procedure Laterality Date   IR ANGIO VERTEBRAL SEL SUBCLAVIAN INNOMINATE UNI R MOD SED  07/04/2020   IR CT HEAD LTD  07/04/2020   IR INTRA CRAN STENT  07/04/2020   IR PERCUTANEOUS ART THROMBECTOMY/INFUSION INTRACRANIAL INC DIAG ANGIO  07/04/2020   RADIOLOGY WITH ANESTHESIA N/A 07/04/2020   Procedure: IR WITH ANESTHESIA;  Surgeon:  Radiologist, Medication, MD;  Location: [facility] OR;  Service: Radiology;  Laterality: N/A;    Family History: Unable to elicit due to aphasia.    Social History:  Widowed. Was still working as a Well[employer] geologist. Has a son who lives in [place] and other children live out of state. Per reports she has never smoked. She has never used smokeless tobacco. Per reports that she does not drink alcohol and does not use drugs.    Allergies  Allergen Reactions   Lipitor [Atorvastatin] Other (See Comments)    Muscle pain   Topiramate Other (See Comments)    Vomiting & Wheezing    Medications Prior to Admission  Medication Sig Dispense Refill   aspirin EC 81 MG tablet Take 81 mg by mouth daily.     ciclopirox (PENLAC) 8 % solution Apply topically daily.     dorzolamide-timolol (COSOPT) 22.3-6.8 MG/ML ophthalmic solution Place 1 drop into both eyes daily.      hydrOXYzine (ATARAX/VISTARIL) 25 MG tablet Take 0.5 tablets (12.5 mg total) by mouth every 8 (eight) hours as needed. 12 tablet 0   latanoprost (XALATAN) 0.005 % ophthalmic solution Place 1 drop into both eyes every evening.      lisinopril-hydrochlorothiazide (PRINZIDE,ZESTORETIC) 20-12.5 MG tablet Take 1 tablet by mouth daily.   3   rosuvastatin (CRESTOR) 40 MG tablet Take 40 mg by mouth daily.     topiramate (TOPAMAX) 25 MG tablet Take 1 tablet (25 mg total) by mouth 2 (two) times daily. 60 tablet 2   fluticasone (FLONASE) 50 MCG/ACT nasal spray Place 2 sprays into both nostrils daily. 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  Functional Status:  Mobility: Bed Mobility Overal bed mobility: Needs Assistance Bed Mobility: Supine to Sit Supine to sit: Min assist General bed mobility comments: cues and min A to move Lt UE to EOB, and assist for trunk  Transfers Overall transfer level: Needs assistance Equipment used: 1 person hand held assist Transfers: Sit to/from Stand, Stand Pivot Transfers Sit to Stand: Min assist Stand pivot transfers: Min assist General transfer comment: assist to boost into standing and assist for balance  Ambulation/Gait Ambulation/Gait assistance: Mod assist, +2 physical assistance Gait Distance (Feet): 5 Feet Assistive device: 2 person hand held assist Gait Pattern/deviations: Step-to pattern, Wide base of support, Drifts right/left General Gait Details: pt with one significant loss of balance requiring modA x2 to correct, increased postural sway and short step length Gait velocity: reduced Gait velocity interpretation: <1.31 ft/sec, indicative of household ambulator    ADL: ADL Overall ADL's : Needs assistance/impaired Eating/Feeding: Minimal assistance, Sitting Grooming: Wash/dry hands, Wash/dry face, Oral care, Brushing hair, Moderate assistance, Sitting Upper Body Bathing: Moderate assistance, Sitting Lower Body Bathing: Moderate assistance, Sit to/from stand Upper Body Dressing : Moderate assistance, Sitting Lower Body Dressing: Maximal assistance, Sit to/from stand Lower Body Dressing Details (indicate cue type and reason): Pt able to doff socks with close min guard assist in supported sitting, and was able to don Rt sock with min A.  She went through the motions of preparing the sock to don it on the Lt sock, however, there was nothing in her hands.  She did this repetitively with no awareness.  When cued, she was able to  locate her sock, and she donnned it with mod A  Toilet Transfer: Moderate assistance, +2 for safety/equipment, Stand-pivot, BSC Toileting- Clothing Manipulation and Hygiene: Moderate assistance, Sit to/from stand Functional mobility during ADLs: Moderate assistance, +2 for safety/equipment  Cognition: Cognition Overall Cognitive Status: Difficult to assess Orientation Level: Oriented X4 Cognition Arousal/Alertness: Awake/alert Behavior During Therapy: WFL for tasks assessed/performed Overall Cognitive Status: Difficult to assess General Comments: Pt follows simple one step commands  consistetently, and 2 step commands inconsistently when gestural cues provided.   Perseveration noted.  She demonstrates good awareness of Rt UE weakness, but does not seem to have good awareness of the severity of her expressive communication deficits  Difficult to assess due to: Impaired communication   Blood pressure (!) 156/71, pulse (!) 32, temperature 98.2 F (36.8 C), temperature source Oral, resp. rate (!) 21, height 5\' 5"  (1.651 m), weight 80.5 kg, SpO2 100 %. Physical Exam Vitals and nursing note reviewed.  Constitutional:      Appearance: Normal appearance.  HENT:     Head: Normocephalic and atraumatic.     Nose: Nose normal.     Mouth/Throat:     Pharynx: Oropharyngeal exudate present.  Eyes:     Extraocular Movements: Extraocular movements intact.  Cardiovascular:     Rate and Rhythm: Normal rate.  Pulmonary:  Effort: Pulmonary effort is normal.  Abdominal:     General: Abdomen is flat.  Musculoskeletal:     Cervical back: Normal range of motion.  Neurological:     Mental Status: She is alert.     Comments: Right facial weakness with dysarthria and garbled speech. Expressive > receptive language deficits. She was able to follow simple motor commands but was inaccurate with more complex problem solving. RUE 2/5. RLE 4/5. LUE and LLE 5/5. Sensation 1/2 RUE, 1+/2 RLE.   Psychiatric:         Mood and Affect: Mood normal.        Thought Content: Thought content normal.     Results for orders placed or performed during the hospital encounter of 07/03/20 (from the past 24 hour(s))  Triglycerides     Status: None   Collection Time: 07/06/20  5:47 AM  Result Value Ref Range   Triglycerides 112 <150 mg/dL  Basic metabolic panel     Status: Abnormal   Collection Time: 07/06/20  5:47 AM  Result Value Ref Range   Sodium 140 135 - 145 mmol/L   Potassium 3.5 3.5 - 5.1 mmol/L   Chloride 111 98 - 111 mmol/L   CO2 19 (L) 22 - 32 mmol/L   Glucose, Bld 105 (H) 70 - 99 mg/dL   BUN 8 8 - 23 mg/dL   Creatinine, Ser 1.610.84 0.44 - 1.00 mg/dL   Calcium 8.7 (L) 8.9 - 10.3 mg/dL   GFR calc non Af Amer >60 >60 mL/min   GFR calc Af Amer >60 >60 mL/min   Anion gap 10 5 - 15  CBC     Status: Abnormal   Collection Time: 07/06/20  5:47 AM  Result Value Ref Range   WBC 8.6 4.0 - 10.5 K/uL   RBC 3.45 (L) 3.87 - 5.11 MIL/uL   Hemoglobin 10.0 (L) 12.0 - 15.0 g/dL   HCT 09.630.2 (L) 36 - 46 %   MCV 87.5 80.0 - 100.0 fL   MCH 29.0 26.0 - 34.0 pg   MCHC 33.1 30.0 - 36.0 g/dL   RDW 04.514.1 40.911.5 - 81.115.5 %   Platelets 253 150 - 400 K/uL   nRBC 0.0 0.0 - 0.2 %   CT Head Wo Contrast  Result Date: 07/05/2020 CLINICAL DATA:  Stroke follow-up EXAM: CT HEAD WITHOUT CONTRAST TECHNIQUE: Contiguous axial images were obtained from the base of the skull through the vertex without intravenous contrast. COMPARISON:  Intraoperative head CT from yesterday FINDINGS: Brain: Subarachnoid hemorrhage centered at the left insula and sylvian fissure, with progressive extent anteriorly and caudal, reaching the interpeduncular cistern. The thickness in areas of pre-existing subarachnoid hemorrhage is also greater. Superimposed cytotoxic edema from insular and lateral frontal infarct. Some parenchymal hemorrhage has also occurred at the level of the subinsular white matter, area difficult to quantify given adjacent thick  subarachnoid hemorrhage and irregular shape. No hydrocephalus and no midline shift. Vascular: Limited for detecting vessel hyperdensity given the adjacent blood products on the left. Skull: Negative Sinuses/Orbits: Negative A page has been placed to Dr. Pearlean BrownieSethi. IMPRESSION: 1. Progressive subarachnoid hemorrhage reaching the basal cisterns. No hydrocephalus. 2. Interval parenchymal hemorrhage at the level of left insular infarction. 3. No shift. Electronically Signed   By: Marnee SpringJonathon  Watts M.D.   On: 07/05/2020 08:05   IR Intra Cran Stent  Result Date: 07/06/2020 INDICATION: Worsening right-sided weakness, slurred speech, aphasia. Occluded left internal carotid artery proximally, and left middle cerebral artery M1 segment on  CT angiogram of the head and neck. EXAM: 1. EMERGENT LARGE VESSEL OCCLUSION THROMBOLYSIS (anterior CIRCULATION) COMPARISON:  CT angiogram of the head and neck of July 03, 2020, and CT of the brain of July 04, 2020. MEDICATIONS: Ancef 2 g IV antibiotic was administered within 1 hour of the procedure. ANESTHESIA/SEDATION: General anesthesia CONTRAST:  Isovue 300 approximately 130 mL FLUOROSCOPY TIME:  Fluoroscopy Time: 61 minutes 34 seconds (2451 mGy). COMPLICATIONS: None immediate. TECHNIQUE: Following a full explanation of the procedure along with the potential associated complications, an informed witnessed consent was obtained. The risks of intracranial hemorrhage of 10%, worsening neurological deficit, ventilator dependency, death and inability to revascularize were all reviewed in detail with the patient's . The patient was then put under general anesthesia by the Department of Anesthesiology at [facility]. The right groin was prepped and draped in the usual sterile fashion. Thereafter using modified Seldinger technique, transfemoral access into the right common femoral artery was obtained without difficulty. Over a 0.035 inch guidewire an 8 French 25 cm Pinnacle sheath  was inserted. Through this, and also over a 0.035 inch guidewire a 5 French Simmons 2 select catheter inside of an 087 balloon guide catheter combination was advanced to the aortic arch region and selectively positioned in the distal left common carotid artery. Guidewire, and the select diagnostic catheter were removed. Good aspiration obtained from the hub of the balloon guide catheter in the distal left common carotid artery. FINDINGS: An arteriogram performed through this continued to demonstrate occluded left internal carotid artery proximally. Distal reconstitution of the cavernous and supraclinoid left ICA was demonstrated from the left external carotid artery branches via the ipsilateral ophthalmic artery. This demonstrated the left A1 segment proximally to be patent. Occlusion of the left middle cerebral artery distal to the anterior temporal branch was noted. PROCEDURE: A Trevo ProVue 021 microcatheter was then advanced over a 0.014 inch standard Synchro micro guidewire with a slight J configuration to the distal end of the balloon guide catheter positioned at the orifice of the left internal carotid artery bulb. After using a torque device, the micro guidewire was gently advanced without difficulty to the petrous horizontal segment of the left internal carotid artery followed by the microcatheter. The guidewire was removed. Good aspiration obtained from the hub of the microcatheter. A gentle contrast injection demonstrated safe position of the tip of the microcatheter again confirming occluded left middle cerebral artery distal to the anterior temporal branch. Micro guidewire was then replaced with an 014 inch 300 cm standard Synchro exchange micro guidewire with a J configuration. A control arteriogram performed through the balloon guide in the left common carotid artery demonstrated a severe stenosis just distal to the bulb secondary to a circumferential atherosclerotic plaque. A 4 mm x 30 mm Viatrac 14  angioplasty balloon catheter was then prepped and purged with heparinized saline infusion proximally and distally. Using the rapid exchange technique, this was then advanced to the site of the severe stenosis and placed with its markers adequate distant to the site of the severe stenosis. Slow control inflation was then performed using micro inflation syringe device via micro tubing to a normal diameter of 4 mm where it was maintained for approximately 30 seconds. No evidence of bradycardia was noted on the balloon inflation. The balloon was then retrieved and removed. A control arteriogram performed through the balloon guide now demonstrated excellent significantly improved flow through the angioplastied segment. More distally moderate spasm was seen in the mid cervical left  ICA which responded to 25 mcg of nitroglycerin. More distally the left anterior cerebral artery appeared widely patent. The left middle cerebral artery M1 segment occlusion was again noted. Over the exchange micro guidewire, a combination of a Trevo ProVue microcatheter and a 134 cm 6 [place] Catalyst intermediary catheter was advanced into the proximal cavernous segment. The exchange micro guidewire was removed. This was replaced with a regular 014 inch standard Synchro micro guidewire with a J-tip configuration. The combination was then navigated through the occluded left middle cerebral artery inferior division into the M2 M3 region followed by microcatheter. The guidewire was removed. Good aspiration obtained from the hub of the microcatheter. This was then connected to continuous heparinized saline infusion. A 5 mm x 33 mm Embotrap retrieval device was then advanced to the distal end of the microcatheter. This was then deployed by retrieving the microcatheter with the proximal end of the retrieval device engaged in the proximal aspect of the occlusion. The Catalyst guide catheter was advanced into the proximal segment of the occluded left  middle cerebral artery. Thereafter proximal flow arrest in the left internal carotid artery with constant aspiration with a 60 mL syringe at the hub of the balloon guide catheter, and constant aspiration at the hub of the Catalyst aspiration catheter over 2-1/2 minutes, the combination of the retrieval device, and the microcatheter were retrieved into the 6 [place] Catalyst guide catheter and the combination was retrieved and removed. Small bits of clot were seen entangled in the interstices of the retrieval device. A control arteriogram performed through the balloon guide catheter with reversal of proximal flow arrest on the left in the internal carotid artery demonstrated complete revascularization of the left internal carotid artery left anterior cerebral artery. The left middle cerebral artery demonstrated significantly improved revascularization to a TICI 2c revascularization. There continued to be an occluded mid perisylvian M2 branch of the superior division. This prompted the advancement of a 35 160 aspiration catheter over a 0.014 inch micro guidewire to the distal end of the left internal carotid artery. The micro guidewire was then gently advanced without difficulty through the occluded superior division M2 segment into the M2 M3 region followed by the advancement of the 35 aspiration catheter as the micro guidewire was retrieved and removed. The aspiration catheter was connected to continuous aspiration to the Penumbra pump for approximately 2-1/2 minutes. Thereafter, the aspiration catheter was withdrawn completely and removed. Copious amount of thin clot was seen within the aspiration catheter. A control arteriogram performed through the balloon guide catheter in the left internal carotid artery demonstrated complete revascularization of the occluded M2 superior division. A near TICI 3 revascularization was achieved. An 021 microcatheter was again advanced over a 0.014 inch standard Synchro micro  guidewire to the horizontal petrous segment of the left internal carotid artery. This was then exchanged for the 014 inch 300 cm standard Synchro exchange micro guidewire with a J-tip configuration. Measurements were then performed of the left internal carotid artery in its most normal segment distal to the angioplastied segment, and of the distal left common carotid artery. It was decided to proceed with placement of a 6/8 mm x 40 mm Xact stent. The delivery catheter with the stent was then retrogradely purged with heparinized saline infusion. Again using the rapid exchange technique this was advanced and positioned adequate distant to the site of the angioplasty in the left internal carotid artery and the left common carotid artery. The stent was then deployed in the usual  manner without any difficulty. With the exchange wire maintained distally in the petrous segment, the delivery apparatus was retrieved and removed. A control arteriogram performed through the balloon guide immediately and at 20 and 30 minutes post deployment of the stent continued to demonstrate excellent flow through the stented segment also intracranially with maintenance of a TICI 3 revascularization. The left anterior cerebral artery territory remained widely patent. During the procedure, the patient's blood pressure and neurological status remained stable. No evidence of extravasation or mass-effect was seen. CT of the brain performed demonstrated no mass effect or midline shift. There, however, was evidence of mild to moderate contrast overlying the anterior parietal convexity, and the posterior peri-insular area. Hemostasis in the right groin puncture was obtained with an 8 French Angio-Seal closure device. The right groin appeared soft. Distal pulses remained Dopplerable bilaterally in both feet unchanged. The patient was left intubated on account of the patient's medical condition to protect airway. Patient was then transferred to the  neuro ICU for post revascularization care. IMPRESSION: Status post endovascular complete revascularization of occluded left middle cerebral artery M1 branch with 1 pass with the 5 mm x 33 mm Embotrap retrieval device and aspiration, and 1 pass with aspiration achieving a TICI 3 revascularization. 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 PLAN: Follow-up in the clinic 4-6 weeks post discharge. Electronically Signed   By: Julieanne Cotton M.D.   On: 07/05/2020 14:10   IR CT Head Ltd  Result Date: 07/06/2020 INDICATION: Worsening right-sided weakness, slurred speech, aphasia. Occluded left internal carotid artery proximally, and left middle cerebral artery M1 segment on CT angiogram of the head and neck. EXAM: 1. EMERGENT LARGE VESSEL OCCLUSION THROMBOLYSIS (anterior CIRCULATION) COMPARISON:  CT angiogram of the head and neck of July 03, 2020, and CT of the brain of July 04, 2020. MEDICATIONS: Ancef 2 g IV antibiotic was administered within 1 hour of the procedure. ANESTHESIA/SEDATION: General anesthesia CONTRAST:  Isovue 300 approximately 130 mL FLUOROSCOPY TIME:  Fluoroscopy Time: 61 minutes 34 seconds (2451 mGy). COMPLICATIONS: None immediate. TECHNIQUE: Following a full explanation of the procedure along with the potential associated complications, an informed witnessed consent was obtained. The risks of intracranial hemorrhage of 10%, worsening neurological deficit, ventilator dependency, death and inability to revascularize were all reviewed in detail with the patient's . The patient was then put under general anesthesia by the Department of Anesthesiology at [facility]. The right groin was prepped and draped in the usual sterile fashion. Thereafter using modified Seldinger technique, transfemoral access into the right common femoral artery was obtained without difficulty. Over a 0.035  inch guidewire an 8 French 25 cm Pinnacle sheath was inserted. Through this, and also over a 0.035 inch guidewire a 5 French Simmons 2 select catheter inside of an 087 balloon guide catheter combination was advanced to the aortic arch region and selectively positioned in the distal left common carotid artery. Guidewire, and the select diagnostic catheter were removed. Good aspiration obtained from the hub of the balloon guide catheter in the distal left common carotid artery. FINDINGS: An arteriogram performed through this continued to demonstrate occluded left internal carotid artery proximally. Distal reconstitution of the cavernous and supraclinoid left ICA was demonstrated from the left external carotid artery branches via the ipsilateral ophthalmic artery. This demonstrated the left A1 segment proximally to be patent. Occlusion of the left middle cerebral artery distal to the anterior temporal branch was noted.  PROCEDURE: A Trevo ProVue 021 microcatheter was then advanced over a 0.014 inch standard Synchro micro guidewire with a slight J configuration to the distal end of the balloon guide catheter positioned at the orifice of the left internal carotid artery bulb. After using a torque device, the micro guidewire was gently advanced without difficulty to the petrous horizontal segment of the left internal carotid artery followed by the microcatheter. The guidewire was removed. Good aspiration obtained from the hub of the microcatheter. A gentle contrast injection demonstrated safe position of the tip of the microcatheter again confirming occluded left middle cerebral artery distal to the anterior temporal branch. Micro guidewire was then replaced with an 014 inch 300 cm standard Synchro exchange micro guidewire with a J configuration. A control arteriogram performed through the balloon guide in the left common carotid artery demonstrated a severe stenosis just distal to the bulb secondary to a circumferential  atherosclerotic plaque. A 4 mm x 30 mm Viatrac 14 angioplasty balloon catheter was then prepped and purged with heparinized saline infusion proximally and distally. Using the rapid exchange technique, this was then advanced to the site of the severe stenosis and placed with its markers adequate distant to the site of the severe stenosis. Slow control inflation was then performed using micro inflation syringe device via micro tubing to a normal diameter of 4 mm where it was maintained for approximately 30 seconds. No evidence of bradycardia was noted on the balloon inflation. The balloon was then retrieved and removed. A control arteriogram performed through the balloon guide now demonstrated excellent significantly improved flow through the angioplastied segment. More distally moderate spasm was seen in the mid cervical left ICA which responded to 25 mcg of nitroglycerin. More distally the left anterior cerebral artery appeared widely patent. The left middle cerebral artery M1 segment occlusion was again noted. Over the exchange micro guidewire, a combination of a Trevo ProVue microcatheter and a 134 cm 6 [place] Catalyst intermediary catheter was advanced into the proximal cavernous segment. The exchange micro guidewire was removed. This was replaced with a regular 014 inch standard Synchro micro guidewire with a J-tip configuration. The combination was then navigated through the occluded left middle cerebral artery inferior division into the M2 M3 region followed by microcatheter. The guidewire was removed. Good aspiration obtained from the hub of the microcatheter. This was then connected to continuous heparinized saline infusion. A 5 mm x 33 mm Embotrap retrieval device was then advanced to the distal end of the microcatheter. This was then deployed by retrieving the microcatheter with the proximal end of the retrieval device engaged in the proximal aspect of the occlusion. The Catalyst guide catheter was  advanced into the proximal segment of the occluded left middle cerebral artery. Thereafter proximal flow arrest in the left internal carotid artery with constant aspiration with a 60 mL syringe at the hub of the balloon guide catheter, and constant aspiration at the hub of the Catalyst aspiration catheter over 2-1/2 minutes, the combination of the retrieval device, and the microcatheter were retrieved into the 6 [place] Catalyst guide catheter and the combination was retrieved and removed. Small bits of clot were seen entangled in the interstices of the retrieval device. A control arteriogram performed through the balloon guide catheter with reversal of proximal flow arrest on the left in the internal carotid artery demonstrated complete revascularization of the left internal carotid artery left anterior cerebral artery. The left middle cerebral artery demonstrated significantly improved revascularization to a TICI 2c revascularization.  There continued to be an occluded mid perisylvian M2 branch of the superior division. This prompted the advancement of a 35 160 aspiration catheter over a 0.014 inch micro guidewire to the distal end of the left internal carotid artery. The micro guidewire was then gently advanced without difficulty through the occluded superior division M2 segment into the M2 M3 region followed by the advancement of the 35 aspiration catheter as the micro guidewire was retrieved and removed. The aspiration catheter was connected to continuous aspiration to the Penumbra pump for approximately 2-1/2 minutes. Thereafter, the aspiration catheter was withdrawn completely and removed. Copious amount of thin clot was seen within the aspiration catheter. A control arteriogram performed through the balloon guide catheter in the left internal carotid artery demonstrated complete revascularization of the occluded M2 superior division. A near TICI 3 revascularization was achieved. An 021 microcatheter was again  advanced over a 0.014 inch standard Synchro micro guidewire to the horizontal petrous segment of the left internal carotid artery. This was then exchanged for the 014 inch 300 cm standard Synchro exchange micro guidewire with a J-tip configuration. Measurements were then performed of the left internal carotid artery in its most normal segment distal to the angioplastied segment, and of the distal left common carotid artery. It was decided to proceed with placement of a 6/8 mm x 40 mm Xact stent. The delivery catheter with the stent was then retrogradely purged with heparinized saline infusion. Again using the rapid exchange technique this was advanced and positioned adequate distant to the site of the angioplasty in the left internal carotid artery and the left common carotid artery. The stent was then deployed in the usual manner without any difficulty. With the exchange wire maintained distally in the petrous segment, the delivery apparatus was retrieved and removed. A control arteriogram performed through the balloon guide immediately and at 20 and 30 minutes post deployment of the stent continued to demonstrate excellent flow through the stented segment also intracranially with maintenance of a TICI 3 revascularization. The left anterior cerebral artery territory remained widely patent. During the procedure, the patient's blood pressure and neurological status remained stable. No evidence of extravasation or mass-effect was seen. CT of the brain performed demonstrated no mass effect or midline shift. There, however, was evidence of mild to moderate contrast overlying the anterior parietal convexity, and the posterior peri-insular area. Hemostasis in the right groin puncture was obtained with an 8 French Angio-Seal closure device. The right groin appeared soft. Distal pulses remained Dopplerable bilaterally in both feet unchanged. The patient was left intubated on account of the patient's medical condition to  protect airway. Patient was then transferred to the neuro ICU for post revascularization care. IMPRESSION: Status post endovascular complete revascularization of occluded left middle cerebral artery M1 branch with 1 pass with the 5 mm x 33 mm Embotrap retrieval device and aspiration, and 1 pass with aspiration achieving a TICI 3 revascularization. 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 PLAN: Follow-up in the clinic 4-6 weeks post discharge. Electronically Signed   By: Julieanne Cotton M.D.   On: 07/05/2020 14:10   Portable Chest xray  Result Date: 07/05/2020 CLINICAL DATA:  Endotracheal tube placement EXAM: PORTABLE CHEST 1 VIEW COMPARISON:  07/04/2020 FINDINGS: Endotracheal tube tip measures 5.2 cm above the carina. Enteric tube tip is off the field of view but below the left hemidiaphragm. Shallow inspiration with probable atelectasis in the right base. Heart  size and pulmonary vascularity are normal. No pleural effusions. No pneumothorax. Mediastinal contours appear intact. Degenerative changes in the spine and shoulders. IMPRESSION: Appliances appear in satisfactory position. Shallow inspiration with atelectasis in the right base. Electronically Signed   By: Burman Nieves M.D.   On: 07/05/2020 05:22   Portable Chest x-ray  Result Date: 07/04/2020 CLINICAL DATA:  Intubated EXAM: PORTABLE CHEST 1 VIEW COMPARISON:  None. FINDINGS: Endotracheal tube tip about 4 cm superior to the carina. Esophageal tube tip is below the diaphragm but is incompletely visualized. No focal opacity or pleural effusion. Normal heart size. Aortic atherosclerosis. No pneumothorax. IMPRESSION: Endotracheal tube tip about 4 cm superior to the carina. No acute pulmonary infiltrate. Electronically Signed   By: Jasmine Pang M.D.   On: 07/04/2020 20:02   DG Abd Portable 1V  Result Date: 07/04/2020 CLINICAL DATA:  OG tube  placement EXAM: PORTABLE ABDOMEN - 1 VIEW COMPARISON:  None. FINDINGS: Esophageal tube tip overlies the gastric fundus, side-port in the region of GE junction. Upper gas pattern is unremarkable IMPRESSION: Esophageal tube tip overlies the gastric fundus with side-port at the GE junction, consider further advancement for more optimal positioning. Electronically Signed   By: Jasmine Pang M.D.   On: 07/04/2020 20:03   IR PERCUTANEOUS ART THROMBECTOMY/INFUSION INTRACRANIAL INC DIAG ANGIO  Result Date: 07/06/2020 INDICATION: Worsening right-sided weakness, slurred speech, aphasia. Occluded left internal carotid artery proximally, and left middle cerebral artery M1 segment on CT angiogram of the head and neck. EXAM: 1. EMERGENT LARGE VESSEL OCCLUSION THROMBOLYSIS (anterior CIRCULATION) COMPARISON:  CT angiogram of the head and neck of July 03, 2020, and CT of the brain of July 04, 2020. MEDICATIONS: Ancef 2 g IV antibiotic was administered within 1 hour of the procedure. ANESTHESIA/SEDATION: General anesthesia CONTRAST:  Isovue 300 approximately 130 mL FLUOROSCOPY TIME:  Fluoroscopy Time: 61 minutes 34 seconds (2451 mGy). COMPLICATIONS: None immediate. TECHNIQUE: Following a full explanation of the procedure along with the potential associated complications, an informed witnessed consent was obtained. The risks of intracranial hemorrhage of 10%, worsening neurological deficit, ventilator dependency, death and inability to revascularize were all reviewed in detail with the patient's . The patient was then put under general anesthesia by the Department of Anesthesiology at [facility]. The right groin was prepped and draped in the usual sterile fashion. Thereafter using modified Seldinger technique, transfemoral access into the right common femoral artery was obtained without difficulty. Over a 0.035 inch guidewire an 8 French 25 cm Pinnacle sheath was inserted. Through this, and also over a 0.035  inch guidewire a 5 French Simmons 2 select catheter inside of an 087 balloon guide catheter combination was advanced to the aortic arch region and selectively positioned in the distal left common carotid artery. Guidewire, and the select diagnostic catheter were removed. Good aspiration obtained from the hub of the balloon guide catheter in the distal left common carotid artery. FINDINGS: An arteriogram performed through this continued to demonstrate occluded left internal carotid artery proximally. Distal reconstitution of the cavernous and supraclinoid left ICA was demonstrated from the left external carotid artery branches via the ipsilateral ophthalmic artery. This demonstrated the left A1 segment proximally to be patent. Occlusion of the left middle cerebral artery distal to the anterior temporal branch was noted. PROCEDURE: A Trevo ProVue 021 microcatheter was then advanced over a 0.014 inch standard Synchro micro guidewire with a slight J configuration to the distal end of the balloon guide catheter positioned at the orifice of  the left internal carotid artery bulb. After using a torque device, the micro guidewire was gently advanced without difficulty to the petrous horizontal segment of the left internal carotid artery followed by the microcatheter. The guidewire was removed. Good aspiration obtained from the hub of the microcatheter. A gentle contrast injection demonstrated safe position of the tip of the microcatheter again confirming occluded left middle cerebral artery distal to the anterior temporal branch. Micro guidewire was then replaced with an 014 inch 300 cm standard Synchro exchange micro guidewire with a J configuration. A control arteriogram performed through the balloon guide in the left common carotid artery demonstrated a severe stenosis just distal to the bulb secondary to a circumferential atherosclerotic plaque. A 4 mm x 30 mm Viatrac 14 angioplasty balloon catheter was then prepped and  purged with heparinized saline infusion proximally and distally. Using the rapid exchange technique, this was then advanced to the site of the severe stenosis and placed with its markers adequate distant to the site of the severe stenosis. Slow control inflation was then performed using micro inflation syringe device via micro tubing to a normal diameter of 4 mm where it was maintained for approximately 30 seconds. No evidence of bradycardia was noted on the balloon inflation. The balloon was then retrieved and removed. A control arteriogram performed through the balloon guide now demonstrated excellent significantly improved flow through the angioplastied segment. More distally moderate spasm was seen in the mid cervical left ICA which responded to 25 mcg of nitroglycerin. More distally the left anterior cerebral artery appeared widely patent. The left middle cerebral artery M1 segment occlusion was again noted. Over the exchange micro guidewire, a combination of a Trevo ProVue microcatheter and a 134 cm 6 [place] Catalyst intermediary catheter was advanced into the proximal cavernous segment. The exchange micro guidewire was removed. This was replaced with a regular 014 inch standard Synchro micro guidewire with a J-tip configuration. The combination was then navigated through the occluded left middle cerebral artery inferior division into the M2 M3 region followed by microcatheter. The guidewire was removed. Good aspiration obtained from the hub of the microcatheter. This was then connected to continuous heparinized saline infusion. A 5 mm x 33 mm Embotrap retrieval device was then advanced to the distal end of the microcatheter. This was then deployed by retrieving the microcatheter with the proximal end of the retrieval device engaged in the proximal aspect of the occlusion. The Catalyst guide catheter was advanced into the proximal segment of the occluded left middle cerebral artery. Thereafter proximal flow  arrest in the left internal carotid artery with constant aspiration with a 60 mL syringe at the hub of the balloon guide catheter, and constant aspiration at the hub of the Catalyst aspiration catheter over 2-1/2 minutes, the combination of the retrieval device, and the microcatheter were retrieved into the 6 [place] Catalyst guide catheter and the combination was retrieved and removed. Small bits of clot were seen entangled in the interstices of the retrieval device. A control arteriogram performed through the balloon guide catheter with reversal of proximal flow arrest on the left in the internal carotid artery demonstrated complete revascularization of the left internal carotid artery left anterior cerebral artery. The left middle cerebral artery demonstrated significantly improved revascularization to a TICI 2c revascularization. There continued to be an occluded mid perisylvian M2 branch of the superior division. This prompted the advancement of a 35 160 aspiration catheter over a 0.014 inch micro guidewire to the distal end of the  left internal carotid artery. The micro guidewire was then gently advanced without difficulty through the occluded superior division M2 segment into the M2 M3 region followed by the advancement of the 35 aspiration catheter as the micro guidewire was retrieved and removed. The aspiration catheter was connected to continuous aspiration to the Penumbra pump for approximately 2-1/2 minutes. Thereafter, the aspiration catheter was withdrawn completely and removed. Copious amount of thin clot was seen within the aspiration catheter. A control arteriogram performed through the balloon guide catheter in the left internal carotid artery demonstrated complete revascularization of the occluded M2 superior division. A near TICI 3 revascularization was achieved. An 021 microcatheter was again advanced over a 0.014 inch standard Synchro micro guidewire to the horizontal petrous segment of the left  internal carotid artery. This was then exchanged for the 014 inch 300 cm standard Synchro exchange micro guidewire with a J-tip configuration. Measurements were then performed of the left internal carotid artery in its most normal segment distal to the angioplastied segment, and of the distal left common carotid artery. It was decided to proceed with placement of a 6/8 mm x 40 mm Xact stent. The delivery catheter with the stent was then retrogradely purged with heparinized saline infusion. Again using the rapid exchange technique this was advanced and positioned adequate distant to the site of the angioplasty in the left internal carotid artery and the left common carotid artery. The stent was then deployed in the usual manner without any difficulty. With the exchange wire maintained distally in the petrous segment, the delivery apparatus was retrieved and removed. A control arteriogram performed through the balloon guide immediately and at 20 and 30 minutes post deployment of the stent continued to demonstrate excellent flow through the stented segment also intracranially with maintenance of a TICI 3 revascularization. The left anterior cerebral artery territory remained widely patent. During the procedure, the patient's blood pressure and neurological status remained stable. No evidence of extravasation or mass-effect was seen. CT of the brain performed demonstrated no mass effect or midline shift. There, however, was evidence of mild to moderate contrast overlying the anterior parietal convexity, and the posterior peri-insular area. Hemostasis in the right groin puncture was obtained with an 8 French Angio-Seal closure device. The right groin appeared soft. Distal pulses remained Dopplerable bilaterally in both feet unchanged. The patient was left intubated on account of the patient's medical condition to protect airway. Patient was then transferred to the neuro ICU for post revascularization care. IMPRESSION:  Status post endovascular complete revascularization of occluded left middle cerebral artery M1 branch with 1 pass with the 5 mm x 33 mm Embotrap retrieval device and aspiration, and 1 pass with aspiration achieving a TICI 3 revascularization. 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 PLAN: Follow-up in the clinic 4-6 weeks post discharge. Electronically Signed   By: Julieanne Cotton M.D.   On: 07/05/2020 14:10   IR ANGIO VERTEBRAL SEL SUBCLAVIAN INNOMINATE UNI R MOD SED  Result Date: 07/06/2020 INDICATION: Worsening right-sided weakness, slurred speech, aphasia. Occluded left internal carotid artery proximally, and left middle cerebral artery M1 segment on CT angiogram of the head and neck. EXAM: 1. EMERGENT LARGE VESSEL OCCLUSION THROMBOLYSIS (anterior CIRCULATION) COMPARISON:  CT angiogram of the head and neck of July 03, 2020, and CT of the brain of July 04, 2020. MEDICATIONS: Ancef 2 g IV antibiotic was administered within 1 hour of the procedure. ANESTHESIA/SEDATION: General anesthesia CONTRAST:  Isovue 300 approximately 130 mL FLUOROSCOPY TIME:  Fluoroscopy Time: 61 minutes 34 seconds (2451 mGy). COMPLICATIONS: None immediate. TECHNIQUE: Following a full explanation of the procedure along with the potential associated complications, an informed witnessed consent was obtained. The risks of intracranial hemorrhage of 10%, worsening neurological deficit, ventilator dependency, death and inability to revascularize were all reviewed in detail with the patient's . The patient was then put under general anesthesia by the Department of Anesthesiology at [facility]. The right groin was prepped and draped in the usual sterile fashion. Thereafter using modified Seldinger technique, transfemoral access into the right common femoral artery was obtained without difficulty. Over a 0.035 inch  guidewire an 8 French 25 cm Pinnacle sheath was inserted. Through this, and also over a 0.035 inch guidewire a 5 French Simmons 2 select catheter inside of an 087 balloon guide catheter combination was advanced to the aortic arch region and selectively positioned in the distal left common carotid artery. Guidewire, and the select diagnostic catheter were removed. Good aspiration obtained from the hub of the balloon guide catheter in the distal left common carotid artery. FINDINGS: An arteriogram performed through this continued to demonstrate occluded left internal carotid artery proximally. Distal reconstitution of the cavernous and supraclinoid left ICA was demonstrated from the left external carotid artery branches via the ipsilateral ophthalmic artery. This demonstrated the left A1 segment proximally to be patent. Occlusion of the left middle cerebral artery distal to the anterior temporal branch was noted. PROCEDURE: A Trevo ProVue 021 microcatheter was then advanced over a 0.014 inch standard Synchro micro guidewire with a slight J configuration to the distal end of the balloon guide catheter positioned at the orifice of the left internal carotid artery bulb. After using a torque device, the micro guidewire was gently advanced without difficulty to the petrous horizontal segment of the left internal carotid artery followed by the microcatheter. The guidewire was removed. Good aspiration obtained from the hub of the microcatheter. A gentle contrast injection demonstrated safe position of the tip of the microcatheter again confirming occluded left middle cerebral artery distal to the anterior temporal branch. Micro guidewire was then replaced with an 014 inch 300 cm standard Synchro exchange micro guidewire with a J configuration. A control arteriogram performed through the balloon guide in the left common carotid artery demonstrated a severe stenosis just distal to the bulb secondary to a circumferential  atherosclerotic plaque. A 4 mm x 30 mm Viatrac 14 angioplasty balloon catheter was then prepped and purged with heparinized saline infusion proximally and distally. Using the rapid exchange technique, this was then advanced to the site of the severe stenosis and placed with its markers adequate distant to the site of the severe stenosis. Slow control inflation was then performed using micro inflation syringe device via micro tubing to a normal diameter of 4 mm where it was maintained for approximately 30 seconds. No evidence of bradycardia was noted on the balloon inflation. The balloon was then retrieved and removed. A control arteriogram performed through the balloon guide now demonstrated excellent significantly improved flow through the angioplastied segment. More distally moderate spasm was seen in the mid cervical left ICA which responded to 25 mcg of nitroglycerin. More distally the left anterior cerebral artery appeared widely patent. The left middle cerebral artery M1 segment occlusion was again noted. Over the exchange micro guidewire, a combination of a Trevo ProVue microcatheter and a 134 cm 6 [place] Catalyst intermediary catheter was advanced into the proximal cavernous segment.  The exchange micro guidewire was removed. This was replaced with a regular 014 inch standard Synchro micro guidewire with a J-tip configuration. The combination was then navigated through the occluded left middle cerebral artery inferior division into the M2 M3 region followed by microcatheter. The guidewire was removed. Good aspiration obtained from the hub of the microcatheter. This was then connected to continuous heparinized saline infusion. A 5 mm x 33 mm Embotrap retrieval device was then advanced to the distal end of the microcatheter. This was then deployed by retrieving the microcatheter with the proximal end of the retrieval device engaged in the proximal aspect of the occlusion. The Catalyst guide catheter was  advanced into the proximal segment of the occluded left middle cerebral artery. Thereafter proximal flow arrest in the left internal carotid artery with constant aspiration with a 60 mL syringe at the hub of the balloon guide catheter, and constant aspiration at the hub of the Catalyst aspiration catheter over 2-1/2 minutes, the combination of the retrieval device, and the microcatheter were retrieved into the 6 [place] Catalyst guide catheter and the combination was retrieved and removed. Small bits of clot were seen entangled in the interstices of the retrieval device. A control arteriogram performed through the balloon guide catheter with reversal of proximal flow arrest on the left in the internal carotid artery demonstrated complete revascularization of the left internal carotid artery left anterior cerebral artery. The left middle cerebral artery demonstrated significantly improved revascularization to a TICI 2c revascularization. There continued to be an occluded mid perisylvian M2 branch of the superior division. This prompted the advancement of a 35 160 aspiration catheter over a 0.014 inch micro guidewire to the distal end of the left internal carotid artery. The micro guidewire was then gently advanced without difficulty through the occluded superior division M2 segment into the M2 M3 region followed by the advancement of the 35 aspiration catheter as the micro guidewire was retrieved and removed. The aspiration catheter was connected to continuous aspiration to the Penumbra pump for approximately 2-1/2 minutes. Thereafter, the aspiration catheter was withdrawn completely and removed. Copious amount of thin clot was seen within the aspiration catheter. A control arteriogram performed through the balloon guide catheter in the left internal carotid artery demonstrated complete revascularization of the occluded M2 superior division. A near TICI 3 revascularization was achieved. An 021 microcatheter was again  advanced over a 0.014 inch standard Synchro micro guidewire to the horizontal petrous segment of the left internal carotid artery. This was then exchanged for the 014 inch 300 cm standard Synchro exchange micro guidewire with a J-tip configuration. Measurements were then performed of the left internal carotid artery in its most normal segment distal to the angioplastied segment, and of the distal left common carotid artery. It was decided to proceed with placement of a 6/8 mm x 40 mm Xact stent. The delivery catheter with the stent was then retrogradely purged with heparinized saline infusion. Again using the rapid exchange technique this was advanced and positioned adequate distant to the site of the angioplasty in the left internal carotid artery and the left common carotid artery. The stent was then deployed in the usual manner without any difficulty. With the exchange wire maintained distally in the petrous segment, the delivery apparatus was retrieved and removed. A control arteriogram performed through the balloon guide immediately and at 20 and 30 minutes post deployment of the stent continued to demonstrate excellent flow through the stented segment also intracranially with maintenance of a TICI  3 revascularization. The left anterior cerebral artery territory remained widely patent. During the procedure, the patient's blood pressure and neurological status remained stable. No evidence of extravasation or mass-effect was seen. CT of the brain performed demonstrated no mass effect or midline shift. There, however, was evidence of mild to moderate contrast overlying the anterior parietal convexity, and the posterior peri-insular area. Hemostasis in the right groin puncture was obtained with an 8 French Angio-Seal closure device. The right groin appeared soft. Distal pulses remained Dopplerable bilaterally in both feet unchanged. The patient was left intubated on account of the patient's medical condition to  protect airway. Patient was then transferred to the neuro ICU for post revascularization care. IMPRESSION: Status post endovascular complete revascularization of occluded left middle cerebral artery M1 branch with 1 pass with the 5 mm x 33 mm Embotrap retrieval device and aspiration, and 1 pass with aspiration achieving a TICI 3 revascularization. 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 PLAN: Follow-up in the clinic 4-6 weeks post discharge. Electronically Signed   By: Julieanne Cotton M.D.   On: 07/05/2020 14:10     Assessment/Plan: Diagnosis: Left MCA infarct with associated left MCA/ICA occlusive disease with subsequent right hemiparesis and aphasia 1. Does the need for close, 24 hr/day medical supervision in concert with the patient's rehab needs make it unreasonable for this patient to be served in a less intensive setting? Yes 2. Co-Morbidities requiring supervision/potential complications: DM, HTN, dysphagia 3. Due to bladder management, bowel management, safety, skin/wound care, disease management, medication administration, pain management and patient education, does the patient require 24 hr/day rehab nursing? Yes 4. Does the patient require coordinated care of a physician, rehab nurse, therapy disciplines of PT, OT, SLP to address physical and functional deficits in the context of the above medical diagnosis(es)? Yes Addressing deficits in the following areas: balance, endurance, locomotion, strength, transferring, bowel/bladder control, bathing, dressing, feeding, grooming, toileting, cognition, speech, language, swallowing and psychosocial support 5. Can the patient actively participate in an intensive therapy program of at least 3 hrs of therapy per day at least 5 days per week? Yes 6. The potential for patient to make measurable gains while on inpatient rehab is  excellent 7. Anticipated functional outcomes upon discharge from inpatient rehab are modified independent and supervision  with PT, supervision with OT, min assist and mod assist with SLP. 8. Estimated rehab length of stay to reach the above functional goals is: 15-20 days 9. Anticipated discharge destination: Home 10. Overall Rehab/Functional Prognosis: good  RECOMMENDATIONS: This patient's condition is appropriate for continued rehabilitative care in the following setting: CIR Patient has agreed to participate in recommended program. Potentially Note that insurance prior authorization may be required for reimbursement for recommended care.  Comment: Need to establish who can provide supervision at home after inpatient rehab admission. Rehab Admissions Coordinator to follow up.  Thanks,  Ranelle Oyster, MD, [place] [facility]  I have personally performed a face to face diagnostic evaluation of this patient. Additionally, I have examined pertinent labs and radiographic images. I have reviewed and concur with the physician assistant's documentation above.    Jacquelynn Cree, PA-C 07/06/2020

## 2020-07-06 NOTE — Progress Notes (Signed)
STROKE TEAM PROGRESS NOTE   INTERVAL HISTORY Patient is sitting up in bed.  No family at the bedside.  Blood pressure adequately controlled.  She remains with severe expressive aphasia and mild right hemiparesis.  She has been started on a diet.  BMP this morning was normal.  Vitals:   07/06/20 0530 07/06/20 0545 07/06/20 0600 07/06/20 0700  BP: (!) 125/52 (!) 133/51 122/85 139/75  Pulse: (!) 35 (!) 42 (!) 41 (!) 41  Resp: 19 (!) 22 (!) 22 (!) 23  Temp:      TempSrc:      SpO2: 99% 100% 97% 100%  Weight:      Height:       CBC:  Recent Labs  Lab 07/03/20 1843 07/03/20 1850 07/05/20 0507 07/06/20 0547  WBC 6.2   < > 9.1 8.6  NEUTROABS 2.8  --  7.5  --   HGB 12.9   < > 10.0* 10.0*  HCT 39.6   < > 31.6* 30.2*  MCV 86.1   < > 88.8 87.5  PLT 299   < > 286 253   < > = values in this interval not displayed.   Basic Metabolic Panel:  Recent Labs  Lab 07/05/20 0507 07/06/20 0547  NA 139 140  K 3.2* 3.5  CL 111 111  CO2 20* 19*  GLUCOSE 150* 105*  BUN 6* 8  CREATININE 0.69 0.84  CALCIUM 8.4* 8.7*  MG 2.0  --    Lipid Panel:  Recent Labs  Lab 07/04/20 0738 07/05/20 0507 07/06/20 0547  CHOL 275*  --   --   TRIG 136   < > 112  HDL 46  --   --   CHOLHDL 6.0  --   --   VLDL 27  --   --   LDLCALC 638*  --   --    < > = values in this interval not displayed.   HgbA1c:  Recent Labs  Lab 07/04/20 0738  HGBA1C 6.1*   Urine Drug Screen: No results for input(s): LABOPIA, COCAINSCRNUR, LABBENZ, AMPHETMU, THCU, LABBARB in the last 168 hours.  Alcohol Level No results for input(s): ETH in the last 168 hours.  IMAGING past 24 hours No results found.   Neuro Interventional Radiology - Cerebral Angiogram with Intervention - Dr Corliss Skains 07/04/20 5:34 PM S/P bilateral common carotid arteriograms followed by complete revascularization of Lt MCA M1 occlusion with x 1 pass wth 53mm x 42mm embotrap retriever and aspiration achieving a TICI 3 revascularization.. S/P  revascularization of symptomatic  acute occlusion of Lt ICA prox with stent assisted angioplasty.  Post treatment CT brain no mass effect. Mild to mod SAH (blood+contrast) in the left post  Sylvian fissure and ant parietal convexity.  PHYSICAL EXAM    Pleasant elderly African-American lady not in distress. . Afebrile. Head is nontraumatic. Neck is supple without bruit.    Cardiac exam no murmur or gallop. Lungs are clear to auscultation. Distal pulses are well felt.   Neurological Exam : She is awake alert but with severe expressive aphasia and can speak only occasional words and very brief sentences.  She has better comprehension and follows midline and 1 and occasional two-step commands.  She is unable to name repeat.  Follows commands well.  Left gaze deviation.  Unable to cross midline when looking to the right.     Blinks to threat more on the left than the right.  Mild right lower facial asymmetry when she  smiles.  Tongue midline.  Mild right hemiparesis but significant weakness of right grip and intrinsic hand muscles.  .  Sensation appears intact.  Reflexes are symmetric.  Plantars downgoing.  Gait not tested  ASSESSMENT/PLAN [name] is a 79 y.o. female with history of cataracts and diabetes presenting with slurred speech, mild face droop. Found to have a tandem L ICA and M2 occlusion w/ transient aphasia and RUE weakness. Admitted to neuro ICU. Did not receive tPA d/t mild sx.    Stroke:   Small L MCA infarct in setting of L ICA/M2 occlusion from large vessel disease with subsequent went worsening due to left M2 occlusion treated with left ICA revascularization with telescopic stents and left M2 mechanical thrombectomy on 07/04/2020.  Postprocedure subarachnoid hemorrhage on the left   .  Code Stroke CT head No acute abnormality. Mild cerebral Atrophy. ASPECTS 10.     CTA head reconstitution of intracranial L ICA at cavernous level but diminuative. Superimposed moderate/severe  atherosclerosis distal cavernous/paraclinoid segment.  Proximal L M2 occlusion. Intracranial R ICA w/ moderate stenosis cavernous segment.  CTA neck L ICA occlusion beyond origin and remains occluded through neck. High-grade near occlusive stenosis proximal R ICA w/ string sign. L VA origin w/ severe atherosclerosis narrowing.   MRI  Multiple small L frontal and parietal lobe infarcts. L ICA signal anbormality distal cervical and precavernous c/w occlusion. Proximal L M2 branch occlusion not well seen.  CT Head - 07/05/20 - increased subarachnoid hemorrhage which now extends into the basal cisterns.  Cytotoxic edema in the left insula and lateral frontal lobe   MRI - 07/03/20 - multiple small acute infarcts in left frontal and parietal lobes and left MCA territory  MRI Brain - 07/06/20 - repeat pending  CT perfusion 6mL penumbra L frontal operculum w/o  Core  Ct head 9/29 (new R hand weakness) interval progression L insula & opercular infarct. No hemorrhage. Possible L MCA hyperdensity, ? New thrombus.  Cerebral angio / IR - 07/04/20 - S/P bilateral common carotid arteriograms followed by complete revascularization of Lt MCA M1 occlusion with x 1 pass wth 37mm x 96mm embotrap retriever and aspiration achieving a TICI 3 revascularization. S/P revascularization of symptomatic  acute occlusion of Lt ICA prox with stent assisted angioplasty.   2D Echo - EF 70 - 75%. No cardiac source of emboli identified.   LDL 202   HgbA1c 6.1   P2Y12 19   VTE prophylaxis - Lovenox 40 mg sq daily   aspirin 81 mg daily prior to admission, now on aspirin 81 mg daily and Brilinta (ticagrelor) 90 mg bid following Brilinta load.    Therapy recommendations:  pending   Disposition:  pending   Keep in bed 9/30  Bilateral Carotid Stenosis   CTA neck - L ICA occlusion beyond origin and remains occluded through neck. High-grade near occlusive stenosis proximal R ICA w/ string sign. L VA origin w/ severe  atherosclerosis narrowing.   Cerebral angio / IR - 07/04/20 - S/P bilateral common carotid arteriograms followed by complete revascularization of Lt MCA M1 occlusion with x 1 pass wth 11mm x 83mm embotrap retriever and aspiration achieving a TICI 3 revascularization. S/P revascularization of symptomatic  acute occlusion of Lt ICA prox with stent assisted angioplasty.   Hypertension  Home meds:  Lisinopril-HCTZ 20-12.5 daily . SBP goal 120 - 140 - currently at goal (120s - 130s) . Long-term BP goal normotensive . Phenylephrine ; prn Labetalol (would avoid using  with bradycardia) and hydralazine  Hyperlipidemia    Home meds:  None  LDL 202, goal < 70  Add lipitor 80  Continue statin at discharge  Pre-Diabetes   Home meds:  none  HgbA1c 6.1, goal < 7.0  Dysphagia . Secondary to stroke . NPO -> Dysphagia II diet with thin liquids . Coughing w/ pos . Speech on board  Other Stroke Risk Factors  Advanced age  Obesity, Body mass index is 29.53 kg/m., recommend weight loss, diet and exercise as appropriate   Other Active Problems  Chronic right arm and upper back stiffness likely of musculoskeletal etiology as well as right hand paresthesias likely from carpal tunnel syndrome. Put on topamax. - seen by Pearlean Brownie as an OP last week - EMG pending  Bradycardia - 30's - 40's  (currently not on medications that should affect heart rate) Would avoid using prn Labetalol with bradycardia  High-grade near occlusive stenosis proximal R ICA w/ string sign.   Anemia - Hgb - 10.0   Hypokalemia - potassium - 3.2->3.5  Extubated 07/05/20   Hospital day # 3   Continue mobilization out of bed.  Therapy consults.  Change systolic blood pressure goal to below 160.  Weaned off Cleviprex drip and use as needed IV labetalol and hydralazine.  No family available at the bedside for discussion.  Transfer to neurology floor bed.  Later today if bed needed This patient is critically ill and at  significant risk of neurological worsening, death and care requires constant monitoring of vital signs, hemodynamics,respiratory and cardiac monitoring, extensive review of multiple databases, frequent neurological assessment, discussion with family, other specialists and medical decision making of high complexity.I have made any additions or clarifications directly to the above note.This critical care time does not reflect procedure time, or teaching time or supervisory time of PA/NP/Med Resident etc but could involve care discussion time.  I spent 30 minutes of neurocritical care time  in the care of  this patient.     Sharon Heady, MD         To contact Stroke Continuity provider, please refer to [email or web address]. After hours, contact General Neurology

## 2020-07-06 NOTE — Progress Notes (Signed)
NAME:  Sharon Gilbert, MRN:  [id number], DOB:  1941-06-20, LOS: 3 ADMISSION DATE:  07/03/2020, CONSULTATION DATE:  07/04/20 REFERRING MD:  Delmer Islam , CHIEF COMPLAINT:  CVA, mechanical ventilation  Brief History   79 yo L ICA/ MCA severe stenosis / occlusion s/p revascularization 9/29. Intubated, PCCM consulted for vent and BP  History of present illness   79 yo F admitted 9/28 as code stroke. Pt son noticed slurred speech and mild facial droop 9/28, and called EMS. Last known normal 07/03/20 1500. Stroke workup revealed L ICA occlusion with probably mid M2 occlusion, as well as near-occlusive stenosis of proximal R ICA. Tpa not given and patient not immediate candidate for NIR intervention due to low NIHSS and tandem L ICA and M2 occlusion. NIR recommended close monitoring, and possible intervention following morning or if clinical change occurs.  9/29 Pt with R sided weakness. CT Head revealed progression of infarct, patient to [facility] for revascularization of L ICA  Remained intubated after NIR. PCCM consulted for vent, BP management    Past Medical History  Pre diabetes Cataracts   Significant Hospital Events   9/28 admitted to stroke service with L ICA occlusion 9/29 L ICA revascularization with NIR, remains intubated after   Consults:  IR PCCM  Procedures:  L ICA revascularization 9/29 ETT 9/29 >>  R radial aline 9/29 >>  Significant Diagnostic Tests:  9/28 CT Head >> interval progression of L MCA territory infarct. No acute hemorrhage, mass. Possible hyperdensity L M2 branch  9/29 TTE >> LVEF 70- 75%, hyperdynamic, G1DD, normal RV, moderate TR 9/29 CTH >> Interval progression of infarct now vomiting the left insula and operculum not seen on the prior MRI yesterday. No associated hemorrhage. In addition, there appears to be possible hyperdensity in the left MCA branch in the sylvian fissure which could be due to new thrombus. 9/30 CTH >> progressive SAH reaching basal  cisterns, no hydrocephalus, interval IPH at left insular infarction, no ML shift  Micro Data:  9/28 SARS Cov2 > neg  9/28 MRSA PCR > neg  Antimicrobials:  Ancef 9/29  Interim history/subjective:  Afebrile  On room air  Glucose range 105-150 I/O 550 ml UOP, +1.5L in last 24 hours   Objective   Blood pressure (!) 158/127, pulse 88, temperature 98.5 F (36.9 C), temperature source Oral, resp. rate (!) 24, height 5\' 5"  (1.651 m), weight 80.5 kg, SpO2 99 %.        Intake/Output Summary (Last 24 hours) at 07/06/2020 0930 Last data filed at 07/06/2020 0800 Gross per 24 hour  Intake 2033.73 ml  Output 550 ml  Net 1483.73 ml   Filed Weights   07/03/20 1800 07/04/20 1245  Weight: 80.5 kg 80.5 kg    Examination: General: elderly female lying in bed in NAD HEENT: MM pink/moist,  Neuro: Awake, alert. Interacts with provider, moves all extremities, RUE weakness. Dysarthric speech but does have intermittent clear words and communicates appropriately  CV: s1s2 rrr, no m/r/g PULM: non-labored on RA, lungs clear bilaterally  GI: soft, bsx4 active  Extremities: warm/dry, trace dependent edema  Skin: no rashes or lesions  Resolved Hospital Problem list     Assessment & Plan:   Acute Respiratory Insufficiency post procedure, requiring MV -tolerating extubation, on room air  -pulmonary hygiene-IS, mobilize -SLP efforts appreciated  L MCA infarction with L ICA occlusion, s/p revascularization of L ICA/ MCA w/ stent, SAH S/p NIR 9/29, R ICA near occlusive stenosis. TTE hyperdynamic  LV 70-75%.  Repeat CT head with progressive SAH reaching basal cisterns, no hydrocephalus, interval IPH at left insular infarction, no ML shift -per Neurology / Neuro IR  -follow neuro exams -PT efforts / rehab focus   Pre-diabetes per A1c 6.1 -goal glucose 140-180  Inadequate PO intake, At Risk Malnutrition  -diet as tolerated   Hypokalemia -monitor, replace as indicated   Normocytic,  Normochromic Anemia -follow CBC -transfuse for hgb <7%  Best practice:  Diet: per SLP  Pain/Anxiety/Delirium protocol (if indicated): prop, PRN fent  VAP protocol (if indicated): yes DVT prophylaxis: SCD's, no anticoagulation with SAH GI prophylaxis: PPI Glucose control: trend on BMET Mobility: BR Code Status: Full Family Communication: per primary Disposition: per primary    PCCM will sign off. Please call back if new needs arise.  Thank you for the consultation.    Labs   CBC: Recent Labs  Lab 07/03/20 1843 07/03/20 1850 07/04/20 2009 07/05/20 0507 07/06/20 0547  WBC 6.2  --   --  9.1 8.6  NEUTROABS 2.8  --   --  7.5  --   HGB 12.9 13.9 11.6* 10.0* 10.0*  HCT 39.6 41.0 34.0* 31.6* 30.2*  MCV 86.1  --   --  88.8 87.5  PLT 299  --   --  286 253    Basic Metabolic Panel: Recent Labs  Lab 07/03/20 1843 07/03/20 1850 07/04/20 2009 07/05/20 0507 07/06/20 0547  NA 135 136 141 139 140  K 3.4* 3.3* 3.1* 3.2* 3.5  CL 101 103  --  111 111  CO2 20*  --   --  20* 19*  GLUCOSE 121* 116*  --  150* 105*  BUN 15 15  --  6* 8  CREATININE 1.02* 1.00  --  0.69 0.84  CALCIUM 9.4  --   --  8.4* 8.7*  MG  --   --   --  2.0  --    GFR: Estimated Creatinine Clearance: 56 mL/min (by C-G formula based on SCr of 0.84 mg/dL). Recent Labs  Lab 07/03/20 1843 07/05/20 0507 07/06/20 0547  WBC 6.2 9.1 8.6    Liver Function Tests: Recent Labs  Lab 07/03/20 1843  AST 22  ALT 18  ALKPHOS 71  BILITOT 0.7  PROT 7.5  ALBUMIN 3.8   No results for input(s): LIPASE, AMYLASE in the last 168 hours. No results for input(s): AMMONIA in the last 168 hours.  ABG    Component Value Date/Time   PHART 7.330 (L) 07/04/2020 2009   PCO2ART 41.2 07/04/2020 2009   PO2ART 395 (H) 07/04/2020 2009   HCO3 21.7 07/04/2020 2009   TCO2 23 07/04/2020 2009   ACIDBASEDEF 4.0 (H) 07/04/2020 2009   O2SAT 100.0 07/04/2020 2009     Coagulation Profile: Recent Labs  Lab 07/03/20 1843  INR 1.1     Cardiac Enzymes: No results for input(s): CKTOTAL, CKMB, CKMBINDEX, TROPONINI in the last 168 hours.  HbA1C: Hgb A1c MFr Bld  Date/Time Value Ref Range Status  07/04/2020 07:38 AM 6.1 (H) 4.8 - 5.6 % Final    Comment:    (NOTE) Pre diabetes:          5.7%-6.4%  Diabetes:              >6.4%  Glycemic control for   <7.0% adults with diabetes     CBG: Recent Labs  Lab 07/03/20 1842  GLUCAP 125*   Critical care time: n/a    Canary Brim, MSN, NP-C,  AGACNP-BC [facility] 07/06/2020, 9:40 AM   Please see [email or web address] for pager details.

## 2020-07-06 NOTE — Progress Notes (Signed)
Inpatient Rehab Admissions Coordinator Note:   Per therapy recommendations, pt was screened for CIR candidacy by Estill Dooms, PT, DPT.  At this time we are recommending a CIR consult and I will place an order per our protocol.  Please contact me with questions.   Estill Dooms, PT, DPT [phone] 07/06/20 3:56 PM

## 2020-07-06 NOTE — Progress Notes (Addendum)
  Speech Language Pathology Treatment: Dysphagia;Cognitive-Linquistic  Patient Details Name: Sharon Gilbert MRN: [id number] DOB: 1941/05/18 Today's Date: 07/06/2020 Time: 2671-2458 SLP Time Calculation (min) (ACUTE ONLY): 28 min  Assessment / Plan / Recommendation Clinical Impression  Pt was seen for treatment and was cooperative during the session. Nursing reported that the pt has been tolerating the current diet without s/sx of aspiration but appeared to have difficulty with the meat at lunch due to its texture. No s/sx of aspiration were noted during this session. Mastication was [facility] with dysphagia 2 solids but prolonged with regular textures with reduced effectiveness. Right-sided pocketing of minimally-masticated regular textures was noted and it was removed with a tongue depressor. Pt produced fluent speech with reduced meaning and frequently held her throat and stated, "I can't". Inconsistent error patterns and groping behaviors were noted during attempts at spontaneous speech and during structured tasks, suggestive of apraxia. However, pt was able to produce some spontaneous utterances such as "I think so" and "Okay". She achieved 40% accuracy with phrase completion given phonemic cues and allowance of close approximation of words. She was unable to produce automatic sequences despite cueing. She achieved 20% accuracy with auditory comprehension of simple yes/no questions. She imitated production of /p/ with 50% accuracy but could not produce /p/(I) words. She imitated /b/ with 75% accuracy when visual cues were given. She imitated /b/ CV words with 60% accuracy given repetitions and visual placement cues. SLP will continue to follow pt.    HPI HPI: Patient is an 79 y.o. female with PMH: prediabetes, cataracts who was admitted on 9/28 as code stroke. Patient lives with son and he noticed slurred speech and mild facial droop. Stroke workup revealed L ICA occlusion, near occlusive stenosis of  proximal R ICA. 9.28 MRI brain revealed multiple small acute infarcts within left frontal and parietal lobes within left MCA vascular territory. CT on 9/30 revealed progressive subarachnoid hemorrhage reaching the basal cisterns, no hydrocephalus. Patient intubated on 9/29 for L ICA revascularization with NIR and extubated on 9/30 to O2 via nasal cannula.      SLP Plan  Continue with current plan of care       Recommendations  Diet recommendations: Dysphagia 2 (fine chop);Thin liquid Liquids provided via: Cup;Straw Medication Administration: Crushed with puree Supervision: Staff to assist with self feeding Compensations: Minimize environmental distractions;Slow rate;Small sips/bites Postural Changes and/or Swallow Maneuvers: Seated upright 90 degrees                Oral Care Recommendations: Oral care BID;Staff/trained caregiver to provide oral care Follow up Recommendations: Inpatient Rehab;24 hour supervision/assistance SLP Visit Diagnosis: Aphasia (R47.01);Apraxia (R48.2) Plan: Continue with current plan of care       Burl Tauzin I. Vear Clock, MS, CCC-SLP Acute Rehabilitation Services Office number [phone] Pager [phone]                Scheryl Marten 07/06/2020, 4:54 PM

## 2020-07-07 ENCOUNTER — Inpatient Hospital Stay (HOSPITAL_COMMUNITY): Payer: Medicare HMO

## 2020-07-07 DIAGNOSIS — I6389 Other cerebral infarction: Secondary | ICD-10-CM

## 2020-07-07 DIAGNOSIS — I6522 Occlusion and stenosis of left carotid artery: Secondary | ICD-10-CM

## 2020-07-07 DIAGNOSIS — I6521 Occlusion and stenosis of right carotid artery: Secondary | ICD-10-CM

## 2020-07-07 DIAGNOSIS — R1312 Dysphagia, oropharyngeal phase: Secondary | ICD-10-CM

## 2020-07-07 DIAGNOSIS — E7800 Pure hypercholesterolemia, unspecified: Secondary | ICD-10-CM

## 2020-07-07 LAB — BASIC METABOLIC PANEL
Anion gap: 13 (ref 5–15)
BUN: 7 mg/dL — ABNORMAL LOW (ref 8–23)
CO2: 20 mmol/L — ABNORMAL LOW (ref 22–32)
Calcium: 8.9 mg/dL (ref 8.9–10.3)
Chloride: 105 mmol/L (ref 98–111)
Creatinine, Ser: 0.83 mg/dL (ref 0.44–1.00)
GFR calc Af Amer: >60 mL/min (ref >60)
GFR calc non Af Amer: >60 mL/min (ref >60)
Glucose, Bld: 101 mg/dL — ABNORMAL HIGH (ref 70–99)
Potassium: 3.4 mmol/L — ABNORMAL LOW (ref 3.5–5.1)
Sodium: 138 mmol/L (ref 135–145)

## 2020-07-07 LAB — CBC
HCT: 32.2 % — ABNORMAL LOW (ref 36.0–46.0)
Hemoglobin: 10.1 g/dL — ABNORMAL LOW (ref 12.0–15.0)
MCH: 27.7 pg (ref 26.0–34.0)
MCHC: 31.4 g/dL (ref 30.0–36.0)
MCV: 88.5 fL (ref 80.0–100.0)
Platelets: 240 10*3/uL (ref 150–400)
RBC: 3.64 MIL/uL — ABNORMAL LOW (ref 3.87–5.11)
RDW: 14.1 % (ref 11.5–15.5)
WBC: 7.5 10*3/uL (ref 4.0–10.5)
nRBC: 0 % (ref 0.0–0.2)

## 2020-07-07 IMAGING — MR MR HEAD W/O CM
9 of 10 series · 38 of 48 positions shown · non-contrast
Comparison: Head CT [DATE], MRI [DATE], and CTA [DATE]

CLINICAL DATA: Stroke follow-up. Endovascular revascularization of
occluded left MCA and left ICA on [DATE].

EXAM:
MRI HEAD WITHOUT CONTRAST
MRA HEAD WITHOUT CONTRAST
TECHNIQUE: Multiplanar, multiecho pulse sequences of the brain and surrounding
structures were obtained without intravenous contrast. Angiographic
images of the head were obtained using MRA technique without
contrast.

[Series 5: DWI · axial · 3.0mm · 0.88mm/px · z∈[-100,+36]mm · 7 of 96 slices shown (1 of 4)]
[im 1/96]
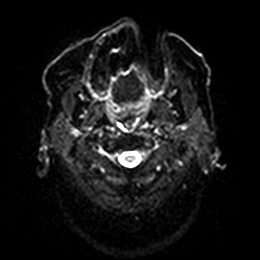
[im 16/96]
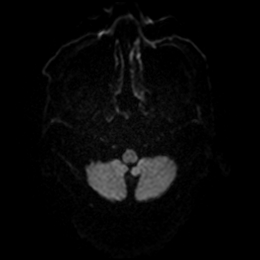
[im 32/96]
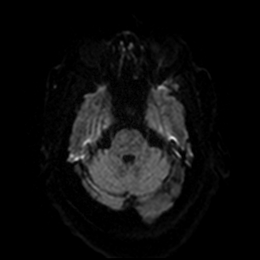
[im 48/96]
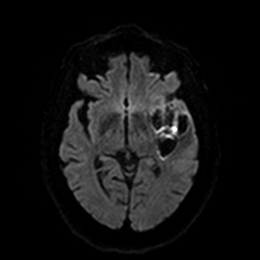
[im 64/96]
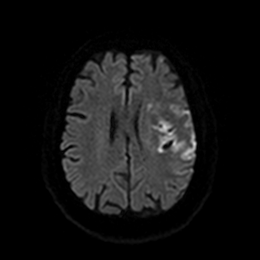
[im 80/96]
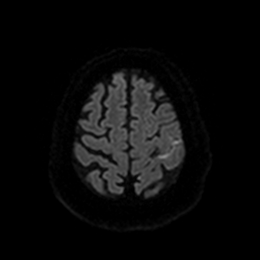
[im 96/96]
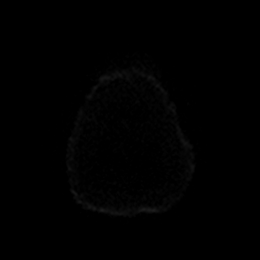

[Series 6: DWI · axial · 3.0mm · 0.88mm/px · z∈[-100,+36]mm · 4 of 48 slices shown (2 of 4)]
[im 1/48]
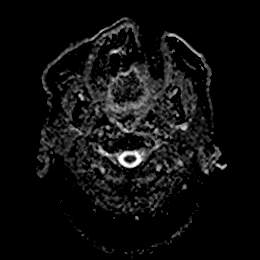
[im 16/48]
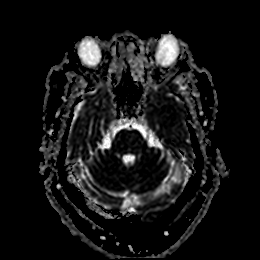
[im 32/48]
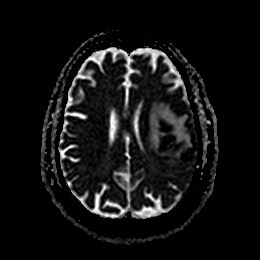
[im 48/48]
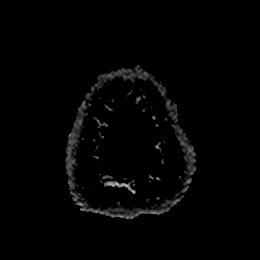

[Series 7: DWI · coronal · 4.0mm · 0.88mm/px · 5 of 64 slices shown (3 of 4)]
[im 1/64]
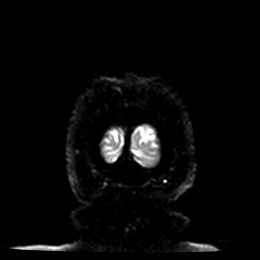
[im 16/64]
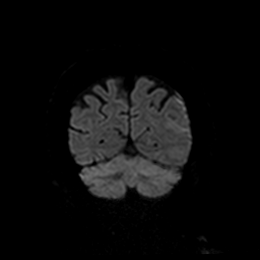
[im 32/64]
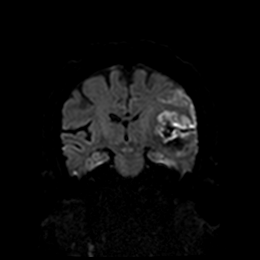
[im 48/64]
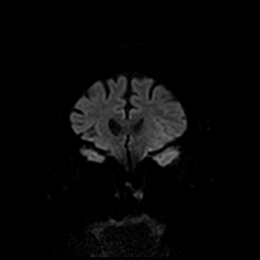
[im 64/64]
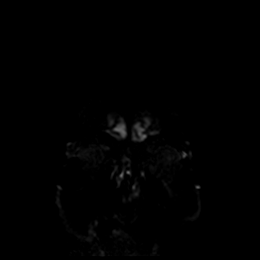

[Series 8: DWI · coronal · 4.0mm · 0.88mm/px · 2 of 32 slices shown (4 of 4)]
[im 1/32]
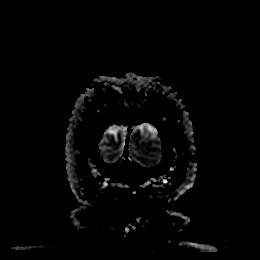
[im 32/32]
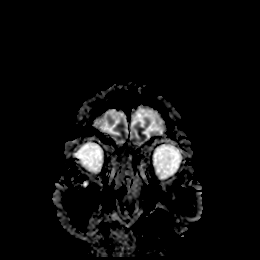

[Series 13: FLAIR · axial · 5.0mm · 0.45mm/px · z∈[-104,+36]mm · 2 of 25 slices shown]
[im 1/25]
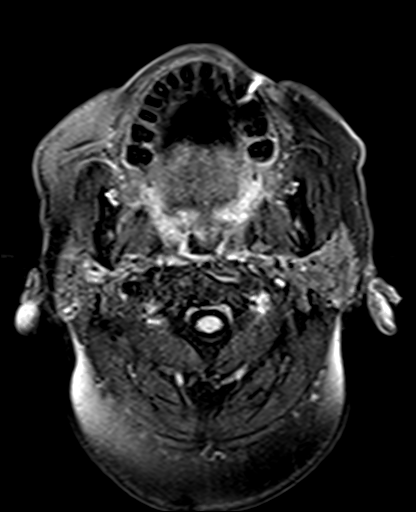
[im 25/25]
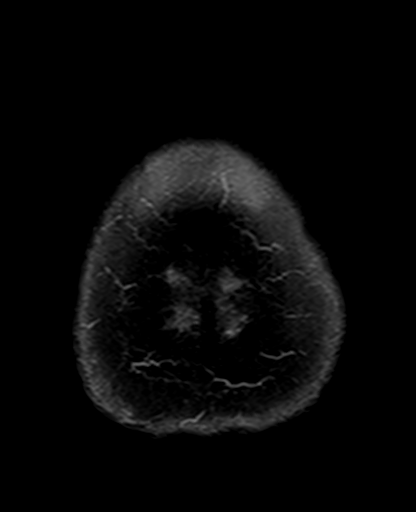

[Series 14: mag_images · axial · 3.0mm · 0.90mm/px · z∈[-120,+52]mm · 5 of 60 slices shown]
[im 1/60]
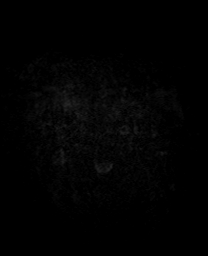
[im 15/60]
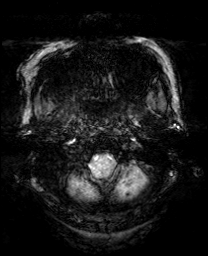
[im 30/60]
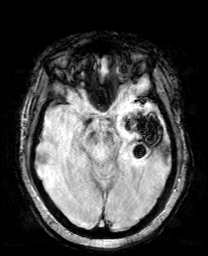
[im 45/60]
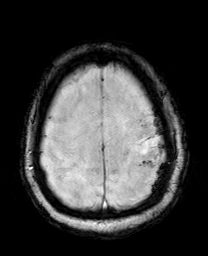
[im 60/60]
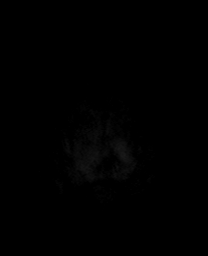

[Series 15: pha_images · axial · 3.0mm · 0.90mm/px · z∈[-120,+52]mm · 4 of 58 slices shown]
[im 1/58]
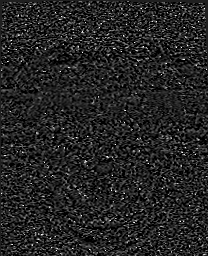
[im 20/58]
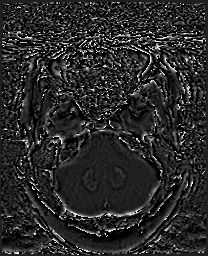
[im 39/58]
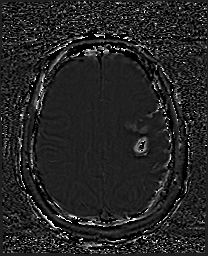
[im 58/58]
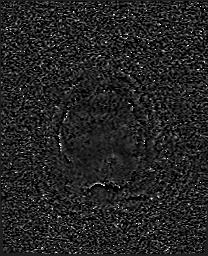

[Series 16: swi_images · axial · 3.0mm · 0.90mm/px · z∈[-120,+52]mm · 5 of 60 slices shown]
[im 1/60]
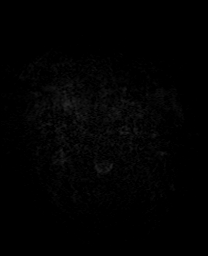
[im 15/60]
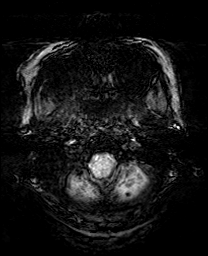
[im 30/60]
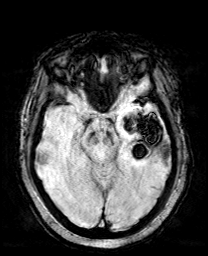
[im 45/60]
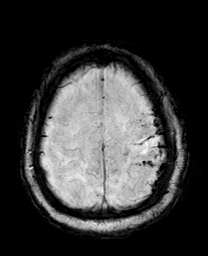
[im 60/60]
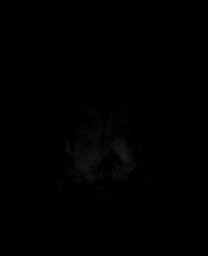

[Series 17: mip_images(sw) · axial · 24.0mm · 0.90mm/px · z∈[-109,+41]mm · 4 of 53 slices shown]
[im 1/53]
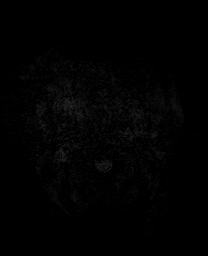
[im 18/53]
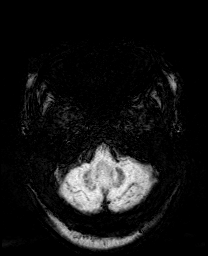
[im 35/53]
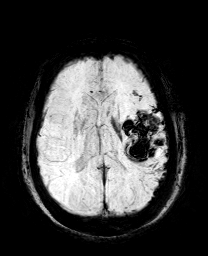
[im 53/53]
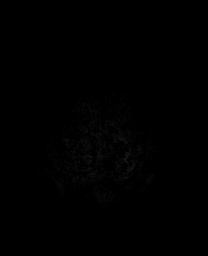

[38 of 48 positions shown; findings below may reference images not displayed]

FINDINGS: MRI HEAD FINDINGS

At the request of the ordering neurologist, only diffusion, FLAIR,
and susceptibility weighted imaging was performed.

There is a moderate-sized acute left MCA infarct primarily involving
the frontoparietal operculum and insula. As seen on the [DATE]
CT, there is large volume subarachnoid hemorrhage in the left
sylvian fissure as well as parenchymal hemorrhage in the left
subinsular region measuring approximately 1.7 cm in maximal
dimension, similar to the prior CT. Subarachnoid hemorrhage is also
again noted in multiple left-sided cerebral sulci. FLAIR
hyperintensity in right occipital sulci may also reflect
subarachnoid hemorrhage. Mass effect associated with the hemorrhagic
left MCA infarct results in partial effacement of the left lateral
ventricle and 3 mm of rightward midline shift, similar to the prior
CT.

MRA HEAD FINDINGS

The visualized distal vertebral arteries are widely patent to the
basilar and codominant. Patent PICA and SCA origins are identified
bilaterally. The basilar artery is widely patent. Posterior
communicating arteries are not identified and may be diminutive or
absent. Both PCAs are patent without evidence of a significant
proximal stenosis.

The internal carotid arteries are patent from the included distal
cervical segments through the carotid termini. Cavernous and
paraclinoid narrowing is mild on the right and mild-to-moderate on
the left. The left A1 segment is diminutive with the left ACA
primarily being supplied by the anterior communicating artery. The
right A1 segment is widely patent. The M1 segments and MCA
bifurcations are widely patent bilaterally. Left MCA branch vessel
evaluation is limited by large volume hemorrhage in the sylvian
fissure although the occluded left M2 branch vessel on the prior CT
appears grossly patent proximally. No aneurysm is identified.
IMPRESSION: 1. Evolving moderate-sized acute left MCA infarct with similar
appearance of parenchymal hemorrhage in the left insular region and
subarachnoid hemorrhage compared to the prior CT. Minimal rightward
midline shift.
2. Intracranial atherosclerosis with mild right and mild-to-moderate
left ICA stenoses. The left ICA remains patent following
revascularization. Limited detailed assessment of left MCA branch
vessels due to subarachnoid hemorrhage in the sylvian fissure.

## 2020-07-07 IMAGING — MR MR MRA HEAD W/O CM
1 series · 18 of 48 positions shown · non-contrast
Comparison: Head CT [DATE], MRI [DATE], and CTA [DATE]

CLINICAL DATA: Stroke follow-up. Endovascular revascularization of
occluded left MCA and left ICA on [DATE].

EXAM:
MRI HEAD WITHOUT CONTRAST
MRA HEAD WITHOUT CONTRAST
TECHNIQUE: Multiplanar, multiecho pulse sequences of the brain and surrounding
structures were obtained without intravenous contrast. Angiographic
images of the head were obtained using MRA technique without
contrast.

[Series 106: 3d cow · axial · 0.5mm · 0.41mm/px · z∈[-101,-7]mm · 18 of 204 slices shown]
[im 1/204]
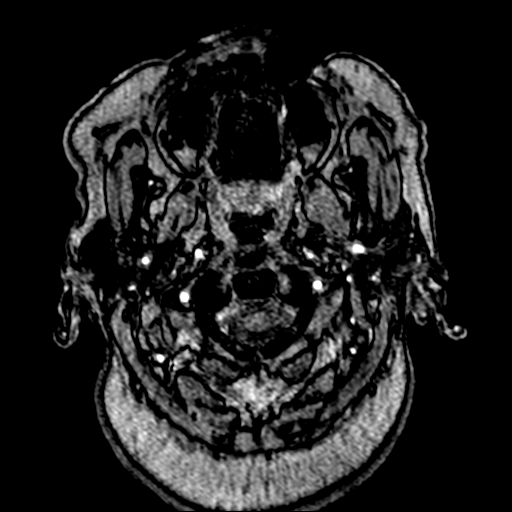
[im 5/204]
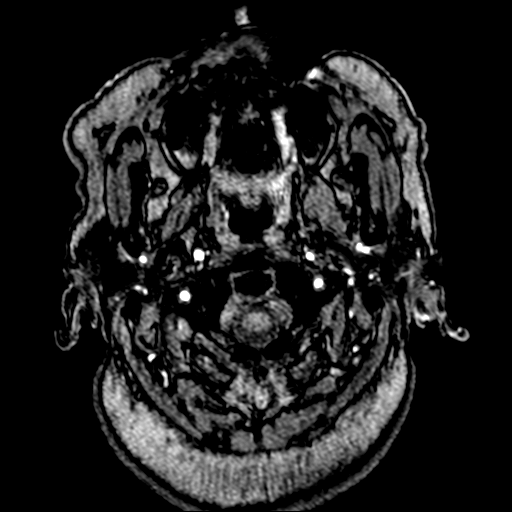
[im 9/204]
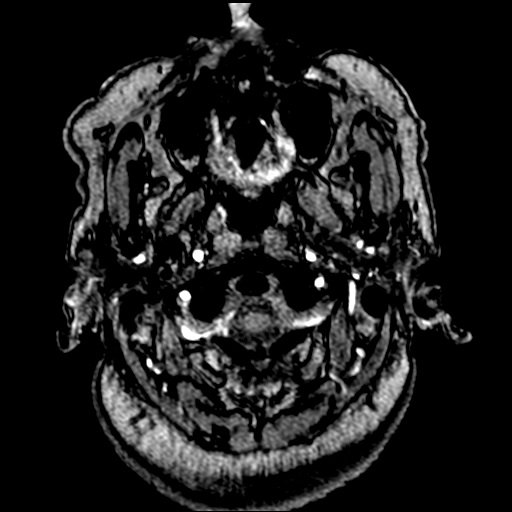
[im 13/204]
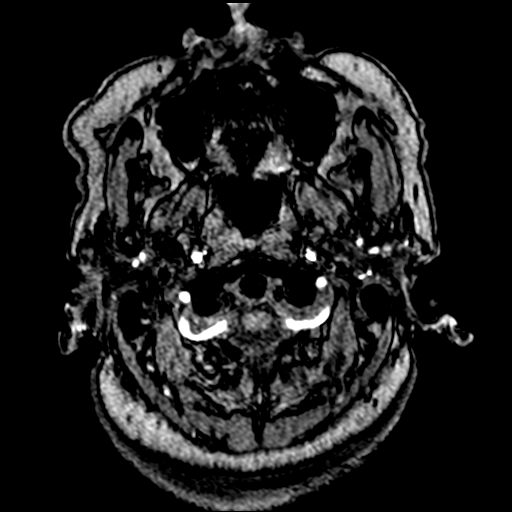
[im 18/204]
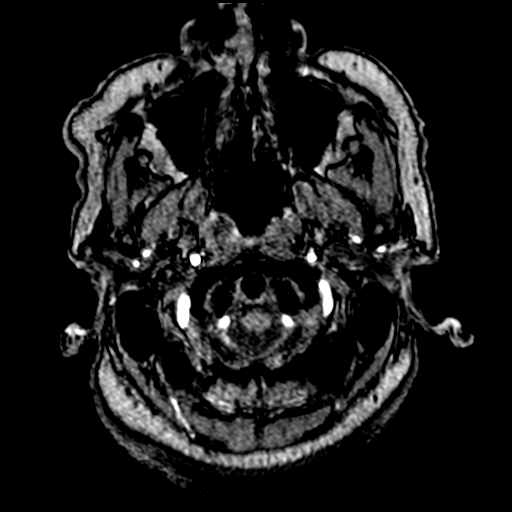
[im 22/204]
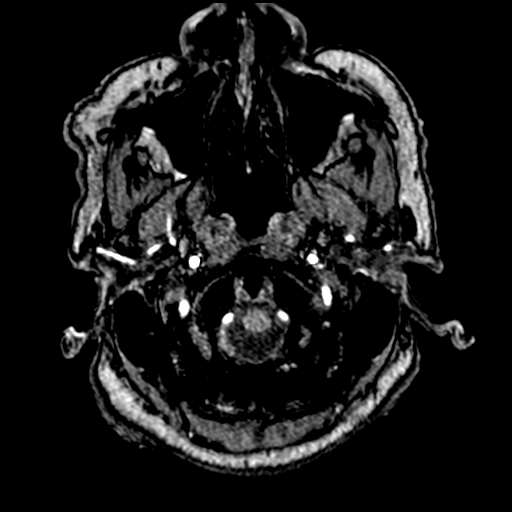
[im 26/204]
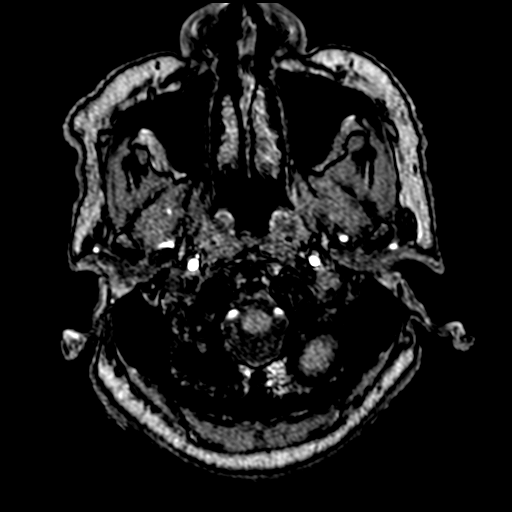
[im 31/204]
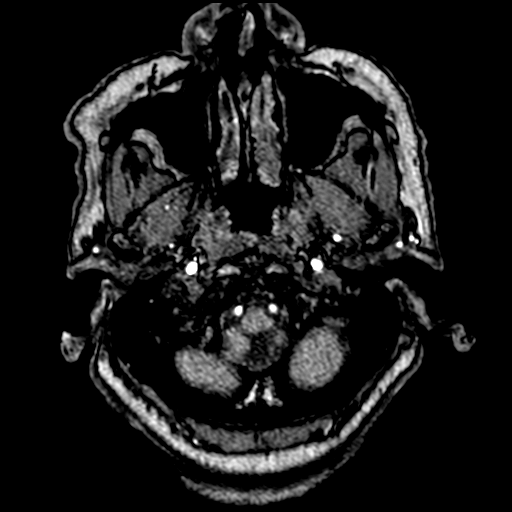
[im 35/204]
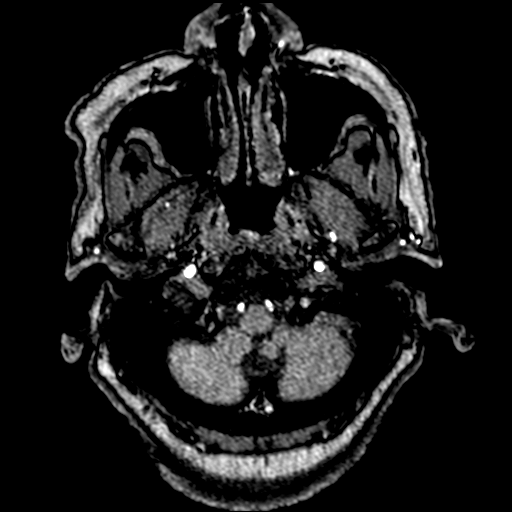
[im 39/204]
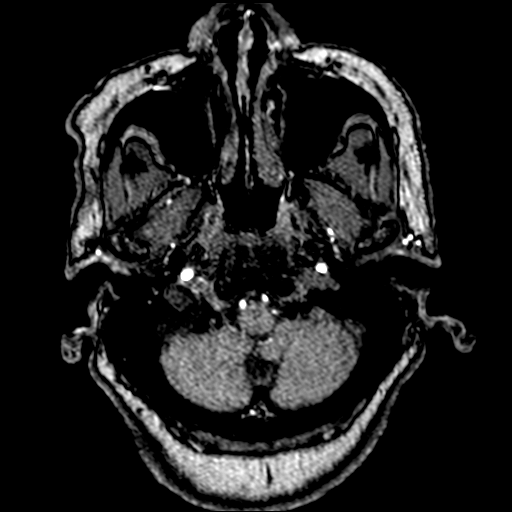
[im 65/204]
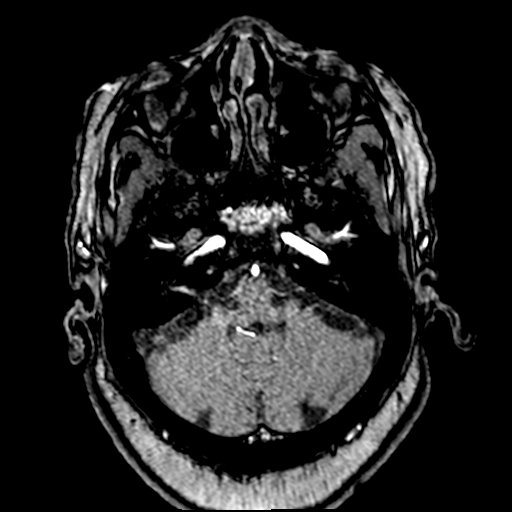
[im 91/204]
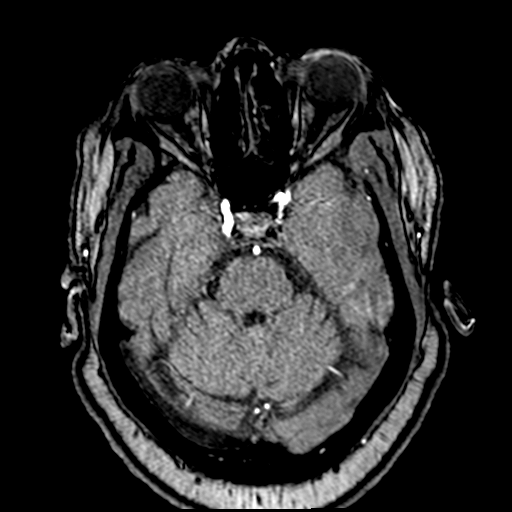
[im 104/204]
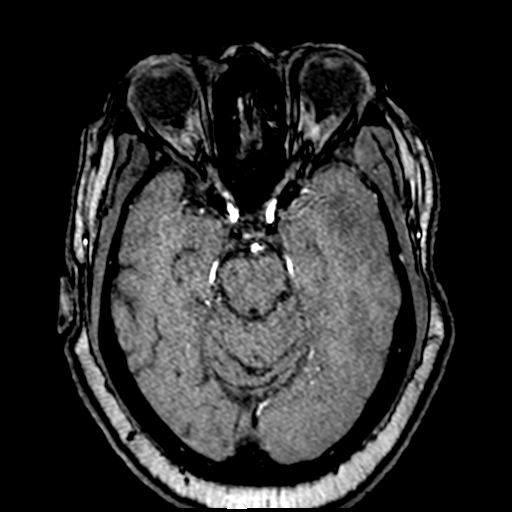
[im 117/204]
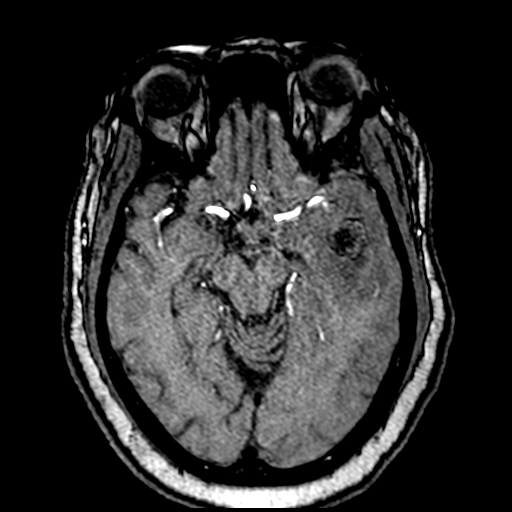
[im 143/204]
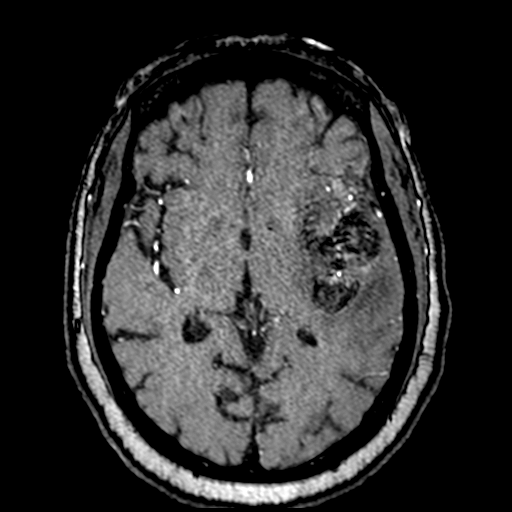
[im 169/204]
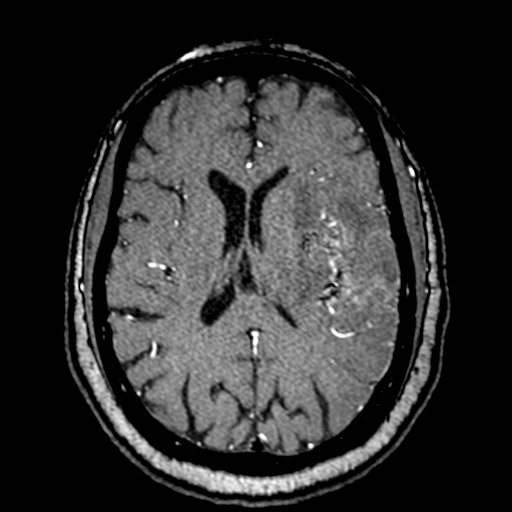
[im 173/204]
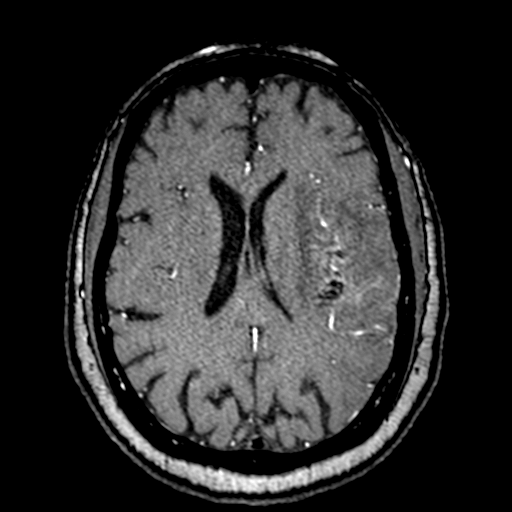
[im 195/204]
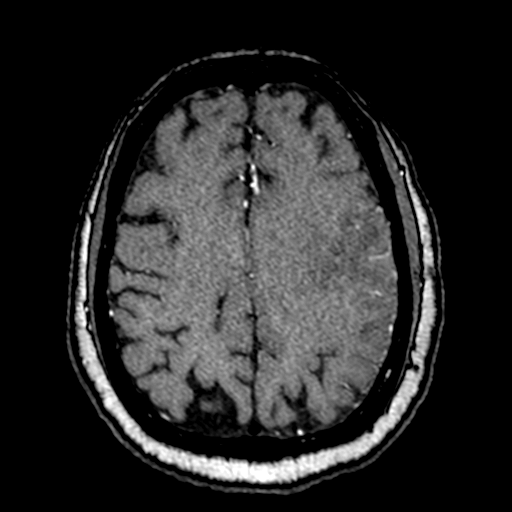

[18 of 48 positions shown; findings below may reference images not displayed]

FINDINGS: MRI HEAD FINDINGS

At the request of the ordering neurologist, only diffusion, FLAIR,
and susceptibility weighted imaging was performed.

There is a moderate-sized acute left MCA infarct primarily involving
the frontoparietal operculum and insula. As seen on the [DATE]
CT, there is large volume subarachnoid hemorrhage in the left
sylvian fissure as well as parenchymal hemorrhage in the left
subinsular region measuring approximately 1.7 cm in maximal
dimension, similar to the prior CT. Subarachnoid hemorrhage is also
again noted in multiple left-sided cerebral sulci. FLAIR
hyperintensity in right occipital sulci may also reflect
subarachnoid hemorrhage. Mass effect associated with the hemorrhagic
left MCA infarct results in partial effacement of the left lateral
ventricle and 3 mm of rightward midline shift, similar to the prior
CT.

MRA HEAD FINDINGS

The visualized distal vertebral arteries are widely patent to the
basilar and codominant. Patent PICA and SCA origins are identified
bilaterally. The basilar artery is widely patent. Posterior
communicating arteries are not identified and may be diminutive or
absent. Both PCAs are patent without evidence of a significant
proximal stenosis.

The internal carotid arteries are patent from the included distal
cervical segments through the carotid termini. Cavernous and
paraclinoid narrowing is mild on the right and mild-to-moderate on
the left. The left A1 segment is diminutive with the left ACA
primarily being supplied by the anterior communicating artery. The
right A1 segment is widely patent. The M1 segments and MCA
bifurcations are widely patent bilaterally. Left MCA branch vessel
evaluation is limited by large volume hemorrhage in the sylvian
fissure although the occluded left M2 branch vessel on the prior CT
appears grossly patent proximally. No aneurysm is identified.
IMPRESSION: 1. Evolving moderate-sized acute left MCA infarct with similar
appearance of parenchymal hemorrhage in the left insular region and
subarachnoid hemorrhage compared to the prior CT. Minimal rightward
midline shift.
2. Intracranial atherosclerosis with mild right and mild-to-moderate
left ICA stenoses. The left ICA remains patent following
revascularization. Limited detailed assessment of left MCA branch
vessels due to subarachnoid hemorrhage in the sylvian fissure.

## 2020-07-07 MED ORDER — TOPIRAMATE 25 MG PO TABS
25.0000 mg | ORAL_TABLET | Freq: Two times a day (BID) | ORAL | Status: DC
  Administered 2020-07-07 (×2): 25 mg via ORAL
  Filled 2020-07-07 (×3): qty 1

## 2020-07-07 MED ORDER — POTASSIUM CHLORIDE CRYS ER 20 MEQ PO TBCR: 40.0000 meq | EXTENDED_RELEASE_TABLET | ORAL | Status: DC

## 2020-07-07 MED ORDER — ENOXAPARIN SODIUM 40 MG/0.4ML ~~LOC~~ SOLN
40.0000 mg | SUBCUTANEOUS | Status: DC
  Administered 2020-07-07 – 2020-07-11 (×5): 40 mg via SUBCUTANEOUS
  Filled 2020-07-07 (×5): qty 0.4

## 2020-07-07 MED ORDER — LISINOPRIL 20 MG PO TABS
20.0000 mg | ORAL_TABLET | Freq: Every day | ORAL | Status: DC
  Administered 2020-07-07 – 2020-07-11 (×5): 20 mg via ORAL
  Filled 2020-07-07 (×5): qty 1

## 2020-07-07 MED ORDER — ORAL CARE MOUTH RINSE
15.0000 mL | Freq: Two times a day (BID) | OROMUCOSAL | Status: DC
  Administered 2020-07-07 – 2020-07-11 (×6): 15 mL via OROMUCOSAL

## 2020-07-07 MED ORDER — HYDRALAZINE HCL 20 MG/ML IJ SOLN
5.0000 mg | INTRAMUSCULAR | Status: DC | PRN
  Administered 2020-07-07: 10 mg via INTRAVENOUS

## 2020-07-07 MED ORDER — POTASSIUM CHLORIDE 20 MEQ PO PACK
40.0000 meq | PACK | ORAL | Status: AC
  Administered 2020-07-07 (×2): 40 meq via ORAL
  Filled 2020-07-07 (×2): qty 2

## 2020-07-07 MED ORDER — LISINOPRIL-HYDROCHLOROTHIAZIDE 20-12.5 MG PO TABS: 1.0000 | ORAL_TABLET | Freq: Every day | ORAL | Status: DC

## 2020-07-07 MED ORDER — HYDROCHLOROTHIAZIDE 12.5 MG PO CAPS
12.5000 mg | ORAL_CAPSULE | Freq: Every day | ORAL | Status: DC
  Administered 2020-07-07 – 2020-07-11 (×5): 12.5 mg via ORAL
  Filled 2020-07-07 (×5): qty 1

## 2020-07-07 NOTE — Progress Notes (Signed)
STROKE TEAM PROGRESS NOTE   INTERVAL HISTORY Son and RN at the bedside. Pt sitting up in bed, comfortable, awake alert. HR 60-70s. BP 140s. Still has expressive aphasia, able to follow one step commands but not 2-step commands. Still has right UE weakness more distally. BLEs symmetrical strength. Ate about 20% of breakfast. On IVF. will repeat MRI and do MRA.   Vitals:   07/07/20 0500 07/07/20 0517 07/07/20 0600 07/07/20 0700  BP: (!) 177/60 (!) 138/51 137/61 (!) 159/64  Pulse:   73   Resp: 19 16 (!) 21 11  Temp:      TempSrc:      SpO2: 100% 99% 100% 100%  Weight:      Height:       CBC:  Recent Labs  Lab 07/03/20 1843 07/03/20 1850 07/05/20 0507 07/05/20 0507 07/06/20 0547 07/07/20 0410  WBC 6.2   < > 9.1   < > 8.6 7.5  NEUTROABS 2.8  --  7.5  --   --   --   HGB 12.9   < > 10.0*   < > 10.0* 10.1*  HCT 39.6   < > 31.6*   < > 30.2* 32.2*  MCV 86.1   < > 88.8   < > 87.5 88.5  PLT 299   < > 286   < > 253 240   < > = values in this interval not displayed.   Basic Metabolic Panel:  Recent Labs  Lab 07/05/20 0507 07/05/20 0507 07/06/20 0547 07/07/20 0410  NA 139   < > 140 138  K 3.2*   < > 3.5 3.4*  CL 111   < > 111 105  CO2 20*   < > 19* 20*  GLUCOSE 150*   < > 105* 101*  BUN 6*   < > 8 7*  CREATININE 0.69   < > 0.84 0.83  CALCIUM 8.4*   < > 8.7* 8.9  MG 2.0  --   --   --    < > = values in this interval not displayed.   Lipid Panel:  Recent Labs  Lab 07/04/20 0738 07/05/20 0507 07/06/20 0547  CHOL 275*  --   --   TRIG 136   < > 112  HDL 46  --   --   CHOLHDL 6.0  --   --   VLDL 27  --   --   LDLCALC 073*  --   --    < > = values in this interval not displayed.   HgbA1c:  Recent Labs  Lab 07/04/20 0738  HGBA1C 6.1*   Urine Drug Screen: No results for input(s): LABOPIA, COCAINSCRNUR, LABBENZ, AMPHETMU, THCU, LABBARB in the last 168 hours.  Alcohol Level No results for input(s): ETH in the last 168 hours.  IMAGING past 24 hours No results found.    MRI and MRA BRAIN - pending  Neuro Interventional Radiology - Cerebral Angiogram with Intervention - Dr Corliss Skains 07/04/20 5:34 PM S/P bilateral common carotid arteriograms followed by complete revascularization of Lt MCA M1 occlusion with x 1 pass wth 6mm x 60mm embotrap retriever and aspiration achieving a TICI 3 revascularization.. S/P revascularization of symptomatic  acute occlusion of Lt ICA prox with stent assisted angioplasty.  Post treatment CT brain no mass effect. Mild to mod SAH (blood+contrast) in the left post  Sylvian fissure and ant parietal convexity.   PHYSICAL EXAM  Temp:  [98.2 F (36.8 C)-98.8 F (37.1 C)] 98.5  F (36.9 C) (10/02 0800) Pulse Rate:  [32-87] 61 (10/02 0900) Resp:  [11-28] 22 (10/02 0900) BP: (130-178)/(51-127) 157/90 (10/02 0900) SpO2:  [1 %-100 %] 100 % (10/02 0900)  General - Well nourished, well developed, in no apparent distress.  Ophthalmologic - fundi not visualized due to noncooperation.  Cardiovascular - Regular rhythm and rate.  Neuro - awake alert expressive aphasia, able to have some words but not sentences and still has intermittent intangible words. Able to follow one step simple commands but not 2-step commands. Not able to name or repeat. No gaze palsy, tracking bilaterally, but not quite cooperative on visual field testing. Right facial droop. Tongue midline. LUE 4/5, RUE proximal 4/5 but bicep tricep 4-/5, wrist extension 3/5 and finger grip 2+/5. BLEs 4/5 proximal and 5/5 distal symmetrical. FTN b/l intact. Sensation seems to have decreased on the right. Gait not tested.   ASSESSMENT/PLAN [name] is a 79 y.o. female with history of cataracts and diabetes presenting with slurred speech, mild face droop. Found to have a tandem L ICA and M2 occlusion w/ transient aphasia and RUE weakness. Admitted to neuro ICU. Did not receive tPA d/t mild sx.   Stroke:   L MCA infarct due to L ICA and M2 occlusion from large vessel  disease with subsequent neuro worsening s/p ICA stent and left MCA thrombectomy.  Postprocedure hemorrhagic conversion at L MCA infarct   CT head 9/28 No acute abnormality. Mild cerebral Atrophy. ASPECTS 10.   CTA head and neck L ICA occlusion beyond origin and remains occluded through neck. Proximal L M2 occlusion. High-grade near occlusive stenosis proximal R ICA w/ string sign. Intracranial R ICA w/ moderate stenosis cavernous segment. L VA origin w/ severe atherosclerosis narrowing.   CT perfusion 78mL penumbra L frontal operculum w/o  Core  MRI 9/28 Multiple small L frontal and parietal lobe infarcts.   IR - 07/04/20 - Lt MCA M1 occlusion with TICI 3 revascularization. S/P revascularization of Lt ICA prox with stent assisted angioplasty.   CT Head 9/30 - increased SAH which now extends into the basal cisterns.  Cytotoxic edema in the left insula and lateral frontal lobe   MRI Brain - 07/07/20 - repeat pending  2D Echo - EF 70 - 75%. No cardiac source of emboli identified.   LDL 202   HgbA1c 6.1   P2Y12 19   VTE prophylaxis - Lovenox 40 mg sq daily   aspirin 81 mg daily prior to admission, now on aspirin 81 mg daily and Brilinta (ticagrelor) 90 mg bid following Brilinta load.    Therapy recommendations: CIR  Disposition:  pending   Bilateral Carotid Stenosis   CTA neck - L ICA occlusion beyond origin and remains occluded through neck. High-grade near occlusive stenosis proximal R ICA w/ string sign. L VA   07/04/20 s/p Lt ICA stent.   MRA pending   Dr. Corliss Skains on board for follow up with right ICA high grade stenosis  Hypertension  Home meds:  Lisinopril-HCTZ 20-12.5 daily  On lisinopril 20  SBP goal < 160 due to HT  Avoid low BP also due to right ICA stenosis  Long-term BP goal 130-150 given ICA stenosis  Hyperlipidemia    Home meds:  None  LDL 202, goal < 70  Add lipitor 80  Continue statin at discharge  Pre-Diabetes   Home meds:  none  HgbA1c  6.1, goal < 7.0  SSI  CBG monitoring  Dysphagia  Secondary to stroke  NPO -> Dysphagia II diet with thin liquids  Encourage po intake  On IVF @ 50  Speech on board  Other Stroke Risk Factors  Advanced age  Overweight, Body mass index is 29.53 kg/m., recommend weight loss, diet and exercise as appropriate   Other Active Problems  Bradycardia - improved, today 60-70s  Anemia - Hgb - 10.0->10.1 stable  Hypokalemia - potassium - 3.2->3.5->3.4 supplement   Hospital day # 4   This patient is critically ill due to left MCA stroke status post thrombectomy and ICA stenting, carotid stenosis high-grade, hemorrhagic conversion hypertensive emergency, dysphagia and at significant risk of neurological worsening, severe bradycardia death form recurrent stroke, worsening hemorrhagic conversion, carotid stent re-occlusion, aspiration pneumonia, sepsis, heart failure. This patient's care requires constant monitoring of vital signs, hemodynamics, respiratory and cardiac monitoring, review of multiple databases, neurological assessment, discussion with family, other specialists and medical decision making of high complexity. I spent 40 minutes of neurocritical care time in the care of this patient. I had long discussion with son at bedside, updated pt current condition, treatment plan and potential prognosis, and answered all the questions. He expressed understanding and appreciation.   Marvel Plan, MD PhD Stroke Neurology 07/07/2020 7:02 PM    To contact Stroke Continuity provider, please refer to [email or web address]. After hours, contact General Neurology

## 2020-07-07 NOTE — Progress Notes (Signed)
Pt transferred with all belongings to [id number]. Son, Fredrik Cove, notified of transfer.

## 2020-07-07 NOTE — Progress Notes (Signed)
Inpatient Rehab Admissions Coordinator:   I attempted to see pt. At bedside to discuss inpatient rehab admission, but she was out of her room at time of attempt. I have not yet reached family members to discuss. I will continue attempts to speak with Pt. And family.  Megan Salon, MS, CCC-SLP Rehab Admissions Coordinator  [phone] (celll) [phone] (office)

## 2020-07-08 DIAGNOSIS — E876 Hypokalemia: Secondary | ICD-10-CM

## 2020-07-08 LAB — BASIC METABOLIC PANEL
Anion gap: 10 (ref 5–15)
BUN: 7 mg/dL — ABNORMAL LOW (ref 8–23)
CO2: 18 mmol/L — ABNORMAL LOW (ref 22–32)
Calcium: 9.3 mg/dL (ref 8.9–10.3)
Chloride: 107 mmol/L (ref 98–111)
Creatinine, Ser: 0.75 mg/dL (ref 0.44–1.00)
GFR calc Af Amer: >60 mL/min (ref >60)
GFR calc non Af Amer: >60 mL/min (ref >60)
Glucose, Bld: 125 mg/dL — ABNORMAL HIGH (ref 70–99)
Potassium: 3.4 mmol/L — ABNORMAL LOW (ref 3.5–5.1)
Sodium: 135 mmol/L (ref 135–145)

## 2020-07-08 LAB — CBC
HCT: 32.9 % — ABNORMAL LOW (ref 36.0–46.0)
Hemoglobin: 10.8 g/dL — ABNORMAL LOW (ref 12.0–15.0)
MCH: 28.3 pg (ref 26.0–34.0)
MCHC: 32.8 g/dL (ref 30.0–36.0)
MCV: 86.1 fL (ref 80.0–100.0)
Platelets: 281 10*3/uL (ref 150–400)
RBC: 3.82 MIL/uL — ABNORMAL LOW (ref 3.87–5.11)
RDW: 13.5 % (ref 11.5–15.5)
WBC: 9.2 10*3/uL (ref 4.0–10.5)
nRBC: 0 % (ref 0.0–0.2)

## 2020-07-08 MED ORDER — CHLORHEXIDINE GLUCONATE 0.12 % MT SOLN
OROMUCOSAL | Status: AC
  Administered 2020-07-08: 15 mL
  Filled 2020-07-08: qty 15

## 2020-07-08 MED ORDER — POTASSIUM CHLORIDE 20 MEQ PO PACK
20.0000 meq | PACK | Freq: Two times a day (BID) | ORAL | Status: AC
  Administered 2020-07-08 – 2020-07-10 (×6): 20 meq via ORAL
  Filled 2020-07-08 (×6): qty 1

## 2020-07-08 NOTE — Progress Notes (Signed)
STROKE TEAM PROGRESS NOTE   INTERVAL HISTORY No family at bedside. Pt lying in bed, still has expressive aphasia, neuro unchanged. Pending placement.  Vitals:   07/07/20 2320 07/08/20 0411 07/08/20 0829 07/08/20 1206  BP: (!) 149/62 (!) 142/66 137/61 (!) 150/53  Pulse: 72 71 77 70  Resp: 20 20 20 18   Temp: 98.6 F (37 C) 98.9 F (37.2 C) 98.6 F (37 C) 98.4 F (36.9 C)  TempSrc: Oral Oral Oral Oral  SpO2: 100% 100% 100% 100%  Weight:      Height:       CBC:  Recent Labs  Lab 07/03/20 1843 07/03/20 1850 07/05/20 0507 07/06/20 0547 07/07/20 0410 07/08/20 0100  WBC 6.2   < > 9.1   < > 7.5 9.2  NEUTROABS 2.8  --  7.5  --   --   --   HGB 12.9   < > 10.0*   < > 10.1* 10.8*  HCT 39.6   < > 31.6*   < > 32.2* 32.9*  MCV 86.1   < > 88.8   < > 88.5 86.1  PLT 299   < > 286   < > 240 281   < > = values in this interval not displayed.   Basic Metabolic Panel:  Recent Labs  Lab 07/05/20 0507 07/06/20 0547 07/07/20 0410 07/08/20 0100  NA 139   < > 138 135  K 3.2*   < > 3.4* 3.4*  CL 111   < > 105 107  CO2 20*   < > 20* 18*  GLUCOSE 150*   < > 101* 125*  BUN 6*   < > 7* 7*  CREATININE 0.69   < > 0.83 0.75  CALCIUM 8.4*   < > 8.9 9.3  MG 2.0  --   --   --    < > = values in this interval not displayed.   Lipid Panel:  Recent Labs  Lab 07/04/20 0738 07/05/20 0507 07/06/20 0547  CHOL 275*  --   --   TRIG 136   < > 112  HDL 46  --   --   CHOLHDL 6.0  --   --   VLDL 27  --   --   LDLCALC 09/05/20*  --   --    < > = values in this interval not displayed.   HgbA1c:  Recent Labs  Lab 07/04/20 0738  HGBA1C 6.1*   Urine Drug Screen: No results for input(s): LABOPIA, COCAINSCRNUR, LABBENZ, AMPHETMU, THCU, LABBARB in the last 168 hours.  Alcohol Level No results for input(s): ETH in the last 168 hours.  IMAGING past 24 hours No results found.   MRI / MRA BRAIN  07/07/20 IMPRESSION: 1. Evolving moderate-sized acute left MCA infarct with similar appearance of  parenchymal hemorrhage in the left insular region and subarachnoid hemorrhage compared to the prior CT. Minimal rightward midline shift. 2. Intracranial atherosclerosis with mild right and mild-to-moderate left ICA stenoses. The left ICA remains patent following revascularization. Limited detailed assessment of left MCA branch vessels due to subarachnoid hemorrhage in the sylvian fissure.  Neuro Interventional Radiology - Cerebral Angiogram with Intervention - Dr 09/06/20 07/04/20 5:34 PM S/P bilateral common carotid arteriograms followed by complete revascularization of Lt MCA M1 occlusion with x 1 pass wth 54mm x 56mm embotrap retriever and aspiration achieving a TICI 3 revascularization.. S/P revascularization of symptomatic  acute occlusion of Lt ICA prox with stent assisted angioplasty.  Post treatment  CT brain no mass effect. Mild to mod SAH (blood+contrast) in the left post  Sylvian fissure and ant parietal convexity.   PHYSICAL EXAM   Temp:  [98 F (36.7 C)-98.9 F (37.2 C)] 98.4 F (36.9 C) (10/03 1206) Pulse Rate:  [29-88] 70 (10/03 1206) Resp:  [18-28] 18 (10/03 1206) BP: (137-166)/(50-105) 150/53 (10/03 1206) SpO2:  [99 %-100 %] 100 % (10/03 1206)  General - Well nourished, well developed, in no apparent distress.  Ophthalmologic - fundi not visualized due to noncooperation.  Cardiovascular - Regular rhythm and rate.  Neuro - awake alert expressive aphasia, able to have some words but not sentences and still has intermittent intangible words. Able to follow one step simple commands but not 2-step commands. Not able to name or repeat. No gaze palsy, tracking bilaterally, but not quite cooperative on visual field testing. Right facial droop. Tongue midline. LUE 4/5, RUE proximal 4/5 but bicep tricep 4-/5, wrist extension 3/5 and finger grip 2+/5. BLEs 4/5 proximal and 5/5 distal symmetrical. FTN b/l intact. Sensation seems to have decreased on the right. Gait not  tested.   ASSESSMENT/PLAN Sharon Gilbert is a 79 y.o. female with history of cataracts and diabetes presenting with slurred speech, mild face droop. Found to have a tandem L ICA and M2 occlusion w/ transient aphasia and RUE weakness. Admitted to neuro ICU. Did not receive tPA d/t mild sx.   Stroke:   L MCA infarct due to L ICA and M2 occlusion from large vessel disease with subsequent neuro worsening s/p ICA stent and left MCA thrombectomy.  Postprocedure hemorrhagic conversion at L MCA infarct   CT head 9/28 No acute abnormality. Mild cerebral Atrophy. ASPECTS 10.   CTA head and neck L ICA occlusion beyond origin and remains occluded through neck. Proximal L M2 occlusion. High-grade near occlusive stenosis proximal R ICA w/ string sign. Intracranial R ICA w/ moderate stenosis cavernous segment. L VA origin w/ severe atherosclerosis narrowing.   CT perfusion 73mL penumbra L frontal operculum w/o  Core  MRI 9/28 Multiple small L frontal and parietal lobe infarcts.   IR - 07/04/20 - Lt MCA M1 occlusion with TICI 3 revascularization. S/P revascularization of Lt ICA prox with stent assisted angioplasty.   CT Head 9/30 - increased SAH which now extends into the basal cisterns.  Cytotoxic edema in the left insula and lateral frontal lobe   MRI 07/07/20 - Evolving moderate-sized acute left MCA infarct with similar appearance of parenchymal hemorrhage in the left insular region and subarachnoid hemorrhage compared to the prior CT.   MRA 07/07/20 -  Intracranial atherosclerosis with mild right and mild-to-moderate left ICA stenoses. The left ICA remains patent following revascularization.   2D Echo - EF 70 - 75%. No cardiac source of emboli identified.   LDL 202   HgbA1c 6.1   P2Y12 19   VTE prophylaxis - Lovenox 40 mg sq daily   aspirin 81 mg daily prior to admission, now on aspirin 81 mg daily and Brilinta (ticagrelor) 90 mg bid following Brilinta load.    Therapy recommendations:  CIR  Disposition:  pending   Bilateral Carotid Stenosis   CTA neck - L ICA occlusion beyond origin and remains occluded through neck. High-grade near occlusive stenosis proximal R ICA w/ string sign. L VA   07/04/20 s/p Lt ICA stent.   MRA 07/07/20 - The left ICA remains patent following revascularization.   Dr. Corliss Skains on board for follow up with right ICA high  grade stenosis  Hypertension  Home meds:  Lisinopril-HCTZ 20-12.5 daily . On lisinopril 20 . SBP goal < 160 due to HT . Avoid low BP also due to right ICA stenosis . Long-term BP goal 130-150 given ICA stenosis  Hyperlipidemia    Home meds:  None  LDL 202, goal < 70  Add lipitor 80  Continue statin at discharge  Pre-Diabetes   Home meds:  none  HgbA1c 6.1, goal < 7.0  SSI  CBG monitoring  Dysphagia . Secondary to stroke . NPO -> Dysphagia II diet with thin liquids . PO intake improved . On IVF @ 50 -> 25 . Speech on board  Other Stroke Risk Factors  Advanced age  Overweight, Body mass index is 29.53 kg/m., recommend weight loss, diet and exercise as appropriate   Other Active Problems  Bradycardia - improved, today 60-70s  Anemia - Hgb - 10.0->10.1 stable  Hypokalemia - potassium - 3.2->3.5->3.4->3.4 - supplement   Hospital day # 5   Marvel Plan, MD PhD Stroke Neurology 07/08/2020 6:54 PM  To contact Stroke Continuity provider, please refer to [email or web address]. After hours, contact General Neurology

## 2020-07-08 NOTE — PMR Pre-admission (Signed)
PMR Admission Coordinator Pre-Admission Assessment  Patient: Sharon Gilbert is an 79 y.o., female MRN: [id number] DOB: [birth year] Height: _0  (165.1 cm) Weight: 80.5 kg              Insurance Information HMO:     PPO: yes    PCP:      IPA:      80/20:      OTHER:  PRIMARY: [employer]      Policy#: [id number]      Subscriber: Pt.  CM Name: Monique      Phone#:  n/a - online at [email or web address] provider portal     Fax#: [phone], Concurrent Review due 09/47/0962 Pre-Cert#: [id number]     Employer:  Benefits:  Phone #:      Name:  Eff. Date: 10/06/2017 - 10/05/2020     Deduct: $0     Out of Pocket Max:  $5,000 ($225 met)      Life Max: n/a CIR: $295/day co-pay with a max co-pay of $1,770/admission (6 days)    SNF: $0/day co-pay for days 1-20, $184/day co-pay for days 21-100; limited to 100 days Outpatient: $35/visit co-pay; limited by medical necessityHome Health:100% coverage, 0% co-insurance; limited by medical necessity DME: DME: 80% coverage; 20% co-insurance Providers: In network   SECONDARY:  none  Financial Counselor:    Phone#:   [employer]" for patients in Inpatient Rehabilitation Facilities with attached "Privacy Act [place] Records" was provided and verbally reviewed with: Patient  Emergency Contact Information Contact Information    Name Relation Home Work Mobile   [place] Spouse [place] Sister [phone]     [name]   [phone]     Current Medical History  Patient Admitting Diagnosis:CVA History of Present Illness: Sharon Gilbert is a 79 y.o. RH-female with history of T2DM and glaucoma;  who was admitted on 07/03/20 with slurred speech. CTA head/neck showed occlusion of L-M2 MCA branch with  showed left ICA occlusion beyond origin, near occlusive stenosis proximal R-ICA with string sign. CT perfusion without core. Follow up MRI brain done revealing multiple small infarcts in left frontal  and parietal lobe. She had developed right sided weakness overnight and underwent cerebral angio with complete revascularization of L-MCA M1 occlusion and revascularization of proximal L-ICA with stent associated angioplasty. Post op CT head showed mild ot moderate SAH and patient left intubated till 9/30. Follow up CT head showed cytotoxic edema from insular cortex and frontal region and increase in [facility]. SBP goals 120-140.  2D echo showed EF 70-75% with moderate TVR and mild to moderate aortic valve sclerosis. She was started on dysphagia 2, thins as no signs of aspiration noted. Patient with right sided weakness with right inattention and severe expressive aphasia with motor apraxia. Therapy evaluations completed and CIR recommended due to functional decline.    Complete NIHSS TOTAL: 8 Glasgow Coma Scale Score: 15  Past Medical History  Past Medical History:  Diagnosis Date  . Cataract   . Diabetes mellitus without complication (HCC)     Family History  family history is not on file.  Prior Rehab/Hospitalizations:  Has the patient had prior rehab or hospitalizations prior to admission? Yes  Has the patient had major surgery during 100 days prior to admission? Yes  Current Medications   Current Facility-Administered Medications:  .  0.9 %  sodium chloride infusion, , Intravenous, Continuous, Rosalin Hawking, MD, Last Rate: 25 mL/hr at 07/10/20 1101,  New Bag at 07/10/20 1101 .  0.9 %  sodium chloride infusion, 250 mL, Intravenous, Continuous, Donnetta Simpers, MD .  acetaminophen (TYLENOL) tablet 650 mg, 650 mg, Oral, Q4H PRN, 650 mg at 07/10/20 2006 **OR** acetaminophen (TYLENOL) 160 MG/5ML solution 650 mg, 650 mg, Per Tube, Q4H PRN **OR** acetaminophen (TYLENOL) suppository 650 mg, 650 mg, Rectal, Q4H PRN, Deveshwar, Sanjeev, MD .  aspirin chewable tablet 81 mg, 81 mg, Oral, Daily, 81 mg at 07/10/20 1027 **OR** aspirin chewable tablet 81 mg, 81 mg, Per Tube, Daily, Deveshwar, Sanjeev, MD,  81 mg at 07/05/20 0947 .  atorvastatin (LIPITOR) tablet 80 mg, 80 mg, Oral, q1800, Donnetta Simpers, MD, 80 mg at 07/09/20 1758 .  Chlorhexidine Gluconate Cloth 2 % PADS 6 each, 6 each, Topical, Daily, Donnetta Simpers, MD, 6 each at 07/09/20 0955 .  dorzolamide-timolol (COSOPT) 22.3-6.8 MG/ML ophthalmic solution 1 drop, 1 drop, Both Eyes, BID, Ollis, Brandi L, NP, 1 drop at 07/10/20 2228 .  enoxaparin (LOVENOX) injection 40 mg, 40 mg, Subcutaneous, Q24H, Rosalin Hawking, MD, 40 mg at 07/10/20 1030 .  hydrALAZINE (APRESOLINE) injection 5-10 mg, 5-10 mg, Intravenous, Q4H PRN, Rosalin Hawking, MD, 10 mg at 07/07/20 2107 .  lisinopril (ZESTRIL) tablet 20 mg, 20 mg, Oral, Daily, 20 mg at 07/10/20 1027 **AND** hydrochlorothiazide (MICROZIDE) capsule 12.5 mg, 12.5 mg, Oral, Daily, Rosalin Hawking, MD, 12.5 mg at 07/10/20 1027 .  latanoprost (XALATAN) 0.005 % ophthalmic solution 1 drop, 1 drop, Both Eyes, QHS, Ollis, Brandi L, NP, 1 drop at 07/10/20 2229 .  MEDLINE mouth rinse, 15 mL, Mouth Rinse, BID, Rosalin Hawking, MD, 15 mL at 07/09/20 0955 .  ticagrelor (BRILINTA) tablet 90 mg, 90 mg, Oral, BID, 90 mg at 07/10/20 2228 **OR** ticagrelor (BRILINTA) tablet 90 mg, 90 mg, Per Tube, BID, Deveshwar, Sanjeev, MD, 90 mg at 07/05/20 0947  Patients Current Diet:  Diet Order            DIET DYS 2 Room service appropriate? Yes with Assist; Fluid consistency: Thin  Diet effective now                 Precautions / Restrictions Precautions Precautions: Fall Precaution Comments: R inattention, R weakness, incontinence, expressive and receptive deficits Restrictions Weight Bearing Restrictions: No   Has the patient had 2 or more falls or a fall with injury in the past year?No  Prior Activity Level Limited Community (1-2x/wk): pt. 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  Independent  Indoor Mobility: Did the patient need assistance with walking from room to room (with or without device)? Independent  Stairs: Did the patient need assistance with internal or external stairs (with or without device)? Independent  Functional Cognition: Did the patient need help planning regular tasks such as shopping or remembering to take medications? Independent  Home Assistive Devices / Equipment Home Equipment: None  Prior Device Use: Indicate devices/aids used by the patient prior to current illness, exacerbation or injury? None of the above  Current Functional Level Cognition  Overall Cognitive Status: Impaired/Different from baseline Difficult to assess due to: Impaired communication Orientation Level: Oriented to person, Oriented to place Following Commands: Follows one step commands consistently General Comments: OT trialed yes/no reponses to questions, at this time pt with 90% accuracy during  exercises for completion of ADL's. able to correctly identfiy written yes/no options as well as provide correct response. immediate verbalizations with fair accuracy i.e. identify color, item. clock draw demos decreased attention to R field/environment, with poor spatial awareness and increased difficulty noted d/t use of non-dom hand.    Extremity Assessment (includes Sensation/Coordination)  Upper Extremity Assessment: Defer to OT evaluation RUE Deficits / Details: Pt demonstrates ~70* shoulder flexion and elbow flex/extension actively.   beginning Mass grasp noted with ~10* AROM noted  RUE Sensation:  (difficult to accurately assess ) RUE Coordination: decreased fine motor, decreased gross motor  Lower Extremity Assessment: Defer to PT evaluation RLE Deficits / Details: grossly 4/5, mild knee buckling noted during ambulation resulting in one LOB RLE Sensation:  (difficult to assess 2/2 communication  deficits) LLE Deficits / Details: grossly 4+/5    ADLs  Overall ADL's : Needs assistance/impaired Eating/Feeding: Set up Grooming: Wash/dry face, Wash/dry hands, Oral care, Minimal assistance, Sitting Upper Body Bathing: Moderate assistance, Cueing for sequencing, Sitting, Cueing for compensatory techniques Lower Body Bathing: Moderate assistance, Sit to/from stand Upper Body Dressing : Moderate assistance, Cueing for compensatory techniques, Cueing for sequencing Upper Body Dressing Details (indicate cue type and reason): doff/donn front and back gown, decreased carry over noted Lower Body Dressing: Maximal assistance, Sit to/from stand Lower Body Dressing Details (indicate cue type and reason): Pt able to doff socks with close min guard assist in supported sitting, and was able to don Rt sock with min A.  She went through the motions of preparing the sock to don it on the Lt sock, however, there was nothing in her hands.  She did this repetitively with no awareness.  When cued, she was able to locate her sock, and she donnned it with mod A  Toilet Transfer: Minimal assistance, Cueing for sequencing, Cueing for safety Toileting- Clothing Manipulation and Hygiene: Moderate assistance Toileting - Clothing Manipulation Details (indicate cue type and reason): A for thoroughness, fair sequencing but noted L UE is not dominant side, Functional mobility during ADLs: Minimal assistance General ADL Comments: Hand hold assist provided during toileting and standing ADL's for steadying, no significant LOB observed, increased cues provided for safety.    Mobility  Overal bed mobility: Needs Assistance Bed Mobility: Supine to Sit Supine to sit: Min assist General bed mobility comments: Bed flat with pt utilizing L bed rail, providing repetitive TC's and VC's to manage LEs off EOB.     Transfers  Overall transfer level: Needs assistance Equipment used: 1 person hand held assist Transfers: Sit to/from  Stand Sit to Stand: Min assist Stand pivot transfers: Min assist General transfer comment: Pt comes to stand swiftly, requiring assistance to steady self intermittently and occasionally 2 attempts to come to stand.     Ambulation / Gait / Stairs / Wheelchair Mobility  Ambulation/Gait Ambulation/Gait assistance: Herbalist (Feet): 100 Feet (x2 bouts with additional 2 bouts of 20 ft) Assistive device: Straight cane, None Gait Pattern/deviations: Step-through pattern, Decreased step length - right (R trunk sway) General Gait Details: Required 1 step commands to sequence utilization of cane with visual, verbal, and tactile cues to attempt 3-point and 2-point gait pattern, with pt moving slowly. Attempted ambulating without assistive device with pt able to ambulate faster with intermittent mod trunk sway to R, x2 minor LOB with minA for safety to regain balance. Gait velocity: reduced (TUG performed 2x with avergae of 27.49 sec no device) Gait velocity interpretation: <1.31 ft/sec, indicative of  household ambulator Stairs: Yes Stairs assistance: Min assist Stair Management: Two rails, Step to pattern Number of Stairs: 6 (on ~6 inch high steps in gym) General stair comments: Ascending and descending with step-to gait pattern and use of B hand rails, displaying unsteadiness and requiring extra time to complete esp when descending. HOH assistance to maintain R hand grip on rail, providing visual and verbal cues to ascend leading with L and descend leading with R. Cued pt to bring B UEs with her as she negotiated stairs. Provided faded feedback with these steps, with good carryover and learning noted but pt still requiring assistance for safety due to unsteadiness.    Posture / Balance Dynamic Sitting Balance Sitting balance - Comments: No LOB with sitting EOB. Balance Overall balance assessment: Needs assistance Sitting-balance support: Feet supported Sitting balance-Leahy Scale:  Fair Sitting balance - Comments: No LOB with sitting EOB. Standing balance support: Single extremity supported, No upper extremity supported (progressed from [facility] in L UE to no device) Standing balance-Leahy Scale: Poor Standing balance comment: Displays bouts of LOB toward R with R LE stance in gait, requiring minA to recover balance.    Special needs/care consideration Special service needs Pt. with severe aphasia     Previous Home Environment (from acute therapy documentation) Living Arrangements: Alone  Lives With: Alone Available Help at Discharge:  (uncertain) Type of Home: [place]: One level Home Access: Stairs to enter Entrance Stairs-Rails: Left, Right Entrance Stairs-Number of Steps: 5 Additional Comments: pt indicates that her son lives with her   Discharge Living Setting Plans for Discharge Living Setting: House Type of Home at Discharge: House Discharge Home Layout: One level Discharge Home Access: Stairs to enter Entrance Stairs-Rails: Can reach both Entrance Stairs-Number of Steps:  (2) Discharge Bathroom Shower/Tub: Tub/shower unit, Walk-in shower Discharge Bathroom Toilet: Handicapped height Discharge Bathroom Accessibility: Yes How Accessible: Accessible via walker  Social/Family/Support Systems Patient Roles: Other (Comment) Contact Information: [phone] Anticipated Caregiver: Elba Barman (sister) Anticipated Caregiver's Contact Information: [phone] Pt. Has two sons who are making decisions for her. Her son, Mikle Bosworth. is local and his contact is [phone]. Her other son, Francee Piccolo, lives in [place] and his number is [phone]. Ability/Limitations of Caregiver: Can provide supervision, is elderly Caregiver Availability: 24/7 Discharge Plan Discussed with Primary Caregiver: Yes Is Caregiver In Agreement with Plan?: Yes Does Caregiver/Family have Issues with Lodging/Transportation while Pt is in Rehab?: No   Goals Patient/Family Goal for  Rehab: PT/OT/SLP Supervision Expected length of stay: 18-21 days Pt/Family Agrees to Admission and willing to participate: Yes Program Orientation Provided & Reviewed with Pt/Caregiver Including Roles  & Responsibilities: Yes   Decrease burden of Care through IP rehab admission: Specialzed equipment needs, Diet advancement, Decrease number of caregivers, Bowel and bladder program and Patient/family education   Possible need for SNF placement upon discharge: not anticipated   Patient Condition: This patient's condition remains as documented in the consult dated 07/09/2020, in which the Rehabilitation Physician determined and documented that the patient's condition is appropriate for intensive rehabilitative care in an inpatient rehabilitation facility. Will admit to inpatient rehab today.  Preadmission Screen Completed By:  Genella Mech, CCC-SLP, 07/11/2020 9:17 AM ______________________________________________________________________   Discussed status with Dr. Ranell Patrick on 07/11/2020 at 9:30 AM and received approval for admission today.  Admission Coordinator:  Genella Mech, time 10:30 AM/Date 07/11/2020

## 2020-07-09 DIAGNOSIS — I6523 Occlusion and stenosis of bilateral carotid arteries: Secondary | ICD-10-CM

## 2020-07-09 LAB — BASIC METABOLIC PANEL
Anion gap: 5 (ref 5–15)
BUN: 11 mg/dL (ref 8–23)
CO2: 26 mmol/L (ref 22–32)
Calcium: 9 mg/dL (ref 8.9–10.3)
Chloride: 105 mmol/L (ref 98–111)
Creatinine, Ser: 0.96 mg/dL (ref 0.44–1.00)
GFR calc Af Amer: >60 mL/min (ref >60)
GFR calc non Af Amer: 56 mL/min — ABNORMAL LOW (ref >60)
Glucose, Bld: 108 mg/dL — ABNORMAL HIGH (ref 70–99)
Potassium: 3.7 mmol/L (ref 3.5–5.1)
Sodium: 136 mmol/L (ref 135–145)

## 2020-07-09 LAB — CBC
HCT: 33.1 % — ABNORMAL LOW (ref 36.0–46.0)
Hemoglobin: 11.1 g/dL — ABNORMAL LOW (ref 12.0–15.0)
MCH: 28.8 pg (ref 26.0–34.0)
MCHC: 33.5 g/dL (ref 30.0–36.0)
MCV: 85.8 fL (ref 80.0–100.0)
Platelets: 316 10*3/uL (ref 150–400)
RBC: 3.86 MIL/uL — ABNORMAL LOW (ref 3.87–5.11)
RDW: 13.6 % (ref 11.5–15.5)
WBC: 9 10*3/uL (ref 4.0–10.5)
nRBC: 0 % (ref 0.0–0.2)

## 2020-07-09 NOTE — NC FL2 (Signed)
[place] MEDICAID FL2 LEVEL OF CARE SCREENING TOOL     IDENTIFICATION  Patient Name: Sharon Gilbert Birthdate: 18-Mar-1941 Sex: female Admission Date (Current Location): 07/03/2020  [facility] and [place] Number:  Producer, television/film/video and Address:  [facility], [address] [place], [place] [address]      Provider Number: [id number]  Attending Physician Name and Address:  Marvel Plan, MD  Relative Name and Phone Number:       Current Level of Care: Hospital Recommended Level of Care: Skilled Nursing Facility Prior Approval Number:    Date Approved/Denied:   PASRR Number: [id number]  Discharge Plan: SNF    Current Diagnoses: Patient Active Problem List   Diagnosis Date Noted   Middle cerebral artery embolism, left 07/04/2020   Acute ischemic left ICA stroke (HCC) 07/03/2020   Numbness 06/27/2020    Orientation RESPIRATION BLADDER Height & Weight     Self, Place  Normal Incontinent Weight: 177 lb 7.5 oz (80.5 kg) Height:  5\' 5"  (165.1 cm)  BEHAVIORAL SYMPTOMS/MOOD NEUROLOGICAL BOWEL NUTRITION STATUS      Incontinent Diet (see DC summary)  AMBULATORY STATUS COMMUNICATION OF NEEDS Skin   Extensive Assist Verbally Normal                       Personal Care Assistance Level of Assistance  Bathing, Feeding, Dressing Bathing Assistance: Maximum assistance Feeding assistance: Limited assistance Dressing Assistance: Maximum assistance     Functional Limitations Info  Speech     Speech Info: Impaired (expressive aphasia, dysarthria)    SPECIAL CARE FACTORS FREQUENCY  PT (By licensed PT), OT (By licensed OT), Speech therapy     PT Frequency: 5x/wk OT Frequency: 5x/wk     Speech Therapy Frequency: 5x/wk      Contractures Contractures Info: Not present    Additional Factors Info  Code Status, Allergies Code Status Info: Full Allergies Info: Lipitor (Atorvastatin), Topiramate           Current Medications  (07/09/2020):  This is the current hospital active medication list Current Facility-Administered Medications  Medication Dose Route Frequency Provider Last Rate Last Admin   0.9 %  sodium chloride infusion   Intravenous Continuous 09/08/2020, MD   Paused at 07/08/20 1723   0.9 %  sodium chloride infusion  250 mL Intravenous Continuous 09/07/20, MD       acetaminophen (TYLENOL) tablet 650 mg  650 mg Oral Q4H PRN Erick Blinks, MD   650 mg at 07/07/20 0901   Or   acetaminophen (TYLENOL) 160 MG/5ML solution 650 mg  650 mg Per Tube Q4H PRN Deveshwar, 09/06/20, MD       Or   acetaminophen (TYLENOL) suppository 650 mg  650 mg Rectal Q4H PRN Deveshwar, Sanjeev, MD       aspirin chewable tablet 81 mg  81 mg Oral Daily Deveshwar, Simonne Maffucci, MD   81 mg at 07/09/20 09/08/20   Or   aspirin chewable tablet 81 mg  81 mg Per Tube Daily Deveshwar, Sanjeev, MD   81 mg at 07/05/20 0947   atorvastatin (LIPITOR) tablet 80 mg  80 mg Oral q1800 07/07/20, MD   80 mg at 07/08/20 1808   Chlorhexidine Gluconate Cloth 2 % PADS 6 each  6 each Topical Daily 09/07/20, MD   6 each at 07/09/20 0955   dorzolamide-timolol (COSOPT) 22.3-6.8 MG/ML ophthalmic solution 1 drop  1 drop Both Eyes BID 09/08/20  L, NP   1 drop at 07/09/20 0954   enoxaparin (LOVENOX) injection 40 mg  40 mg Subcutaneous Q24H Marvel Plan, MD   40 mg at 07/09/20 0953   hydrALAZINE (APRESOLINE) injection 5-10 mg  5-10 mg Intravenous Q4H PRN Marvel Plan, MD   10 mg at 07/07/20 2107   lisinopril (ZESTRIL) tablet 20 mg  20 mg Oral Daily Marvel Plan, MD   20 mg at 07/09/20 8756   And   hydrochlorothiazide (MICROZIDE) capsule 12.5 mg  12.5 mg Oral Daily Marvel Plan, MD   12.5 mg at 07/09/20 0952   latanoprost (XALATAN) 0.005 % ophthalmic solution 1 drop  1 drop Both Eyes QHS Ollis, Brandi L, NP   1 drop at 07/08/20 2209   MEDLINE mouth rinse  15 mL Mouth Rinse BID Marvel Plan, MD   15 mL at 07/09/20 0955    potassium chloride (KLOR-CON) packet 20 mEq  20 mEq Oral BID Rinehuls, David L, PA-C   20 mEq at 07/09/20 4332   ticagrelor (BRILINTA) tablet 90 mg  90 mg Oral BID Julieanne Cotton, MD   90 mg at 07/09/20 9518   Or   ticagrelor (BRILINTA) tablet 90 mg  90 mg Per Tube BID Julieanne Cotton, MD   90 mg at 07/05/20 8416     Discharge Medications: Please see discharge summary for a list of discharge medications.  Relevant Imaging Results:  Relevant Lab Results:   Additional Information SS#: [id number]  Baldemar Lenis, LCSW

## 2020-07-09 NOTE — Progress Notes (Addendum)
Inpatient Rehab Admissions Coordinator:   I spoke with Pt.'s son, Fredrik [place]ove, to discuss potential rehab options. He is not sure family can provide sufficient support at d/c but will reach out to his brother and aunt to discuss. I have opened a case for insurance auth for CIR with pt.'s insurance and SW is looking into SNFs if family is not able to provide support at d/c.   Megan Salon, MS, CCC-SLP Rehab Admissions Coordinator  [phone] (celll) [phone] (office)

## 2020-07-09 NOTE — Progress Notes (Signed)
Physical Therapy Treatment Patient Details Name: Sharon Gilbert MRN: [id number] DOB: [birth year] Today's Date: 07/09/2020    History of Present Illness This 79 y.o. female admitted with slurred speech and mild facial droop. 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  She underwent Lt M2 revasularization with telescopic stents and M2 mechanical thrombectomy on 07/04/2020.  Post procudrue she developed Lt SAH.   MRI showed multiple Lt frontal and parietal lobe infarcts.  Repeat CT of head on 9/30 showed increased SAH which extends into the basal cisterns, as well as cytotoxic edema Lt insula and Lt frontal lobes.  PMH includes:  HTN, cataracts     PT Comments    The patient displays R sided weakness, affecting her safety and independence with functional mobility. She required utilization of the bed rail for bed mobility and continuous VC's throughout each task to perform appropriately and safely. Pt is progressing towards her goals as she was able to ambulate further distances this date with her progressing from 2 HHA to 1 HHA and then to utilizing a SPC in her L UE. She was also able to negotiate 6 steps with utilization of B hand rails. However, she displays trunk sway and unsteadiness resulting in her requiring minA to maintain her balance and safety. She requires extensive periods of time for all mobility and single step directions continuously. She displays difficulty following multiple step commands or performing several tasks simultaneously. Pt continues to have expressive aphasia. Secondary to the patient being at a high level of function previously and her current necessity for assistance with all tasks, inpatient rehab continues to be an appropriate recommendation for PT follow-up once discharged from the hospital. Will continue to follow up with pt to address her mentioned deficits to maximize her independence and safety with all functional  mobility.   Follow Up Recommendations  CIR;Supervision for mobility/OOB     Equipment Recommendations  Cane    Recommendations for Other Services Rehab consult     Precautions / Restrictions Precautions Precautions: Fall Restrictions Weight Bearing Restrictions: No    Mobility  Bed Mobility Overal bed mobility: Needs Assistance Bed Mobility: Supine to Sit     Supine to sit: Min assist     General bed mobility comments: Bed flat and VC's provided to bring UEs and LEs to EOB, utilizing L bed rail to come to sit L EOB with assistance to bring hips anteriorly to square up with EOB.  Transfers Overall transfer level: Needs assistance Equipment used: 1 person hand held assist Transfers: Sit to/from Stand Sit to Stand: Min assist         General transfer comment: HOH cues provided for pt to maintain R hand placement on arm rests of chair during transfer to assist by pushing up. Pt displays unsteadiness and thus requires assistance for safety.  Ambulation/Gait Ambulation/Gait assistance: Min assist (chair follow throughout for safety) Gait Distance (Feet): 100 Feet (x2 bouts with seated rest break between bouts) Assistive device: 2 person hand held assist;1 person hand held assist;Straight cane Gait Pattern/deviations: Step-to pattern;Decreased step length - right;Decreased stride length   Gait velocity interpretation: <1.31 ft/sec, indicative of household ambulator General Gait Details: During 1st bout, pt progressed from 2 HHA to 1 R HHA, cuing pt visually and verbally to utilize squares on floor to inc R step length and to keep feet shoulder width-apart as the R LE would intermittently adduct to make a narrow BOS, with  success but requiring extra time and pt continuously looking down at feet despite continual cues to look anteriorly. During 2nd bout, pt utilized SPC in L UE and was provided visual and verbal cues and single step commands throughout to advance cane > R LE > L  LE, with success but extra time and pt able to maintain pattern minimally when cued to look up rather than at feet. Unsteadiness noted during gait, requiring TCs and assistance to maintain balance and advance cane initially.    Stairs Stairs: Yes Stairs assistance: Min assist Stair Management: Two rails;Step to pattern Number of Stairs: 6 (on ~6 inch high steps in gym) General stair comments: Ascending and descending with step-to gait pattern and use of B hand rails, displaying unsteadiness and requiring extra time to complete esp when descending. HOH assistance to maintain R hand grip on rail, providing visual and verbal cues to ascend leading with L and descend leading with R. Cued pt to bring B UEs with her as she negotiated stairs. Provided faded feedback with these steps, with good carryover and learning noted but pt still requiring assistance for safety due to unsteadiness.   Wheelchair Mobility    Modified Rankin (Stroke Patients Only) Modified Rankin (Stroke Patients Only) Pre-Morbid Rankin Score: No symptoms Modified Rankin: Moderately severe disability     Balance Overall balance assessment: Needs assistance Sitting-balance support: Feet supported       Standing balance support: Single extremity supported   Standing balance comment: requires minA due to unsteadiness and trunk sway                            Cognition Arousal/Alertness: Awake/alert Behavior During Therapy: WFL for tasks assessed/performed Overall Cognitive Status: Impaired/Different from baseline Area of Impairment: Following commands                       Following Commands: Follows one step commands consistently;Follows multi-step commands inconsistently       General Comments: Pt follows one step commands consistently, but when provided with a series of instructions or to do multiple tasks simultaneously she has difficulty.      Exercises      General Comments         Pertinent Vitals/Pain Pain Assessment: No/denies pain    Home Living                      Prior Function            PT Goals (current goals can now be found in the care plan section) Acute Rehab PT Goals Patient Stated Goal: Pt unable to state  PT Goal Formulation: Patient unable to participate in goal setting Time For Goal Achievement: 07/20/20 Potential to Achieve Goals: Good Progress towards PT goals: Progressing toward goals    Frequency    Min 4X/week      PT Plan Current plan remains appropriate    Co-evaluation     PT goals addressed during session: Mobility/safety with mobility;Proper use of DME        AM-PAC PT "6 Clicks" Mobility   Outcome Measure  Help needed turning from your back to your side while in a flat bed without using bedrails?: A Little Help needed moving from lying on your back to sitting on the side of a flat bed without using bedrails?: A Little Help needed moving to and from a bed to a chair (  including a wheelchair)?: A Little Help needed standing up from a chair using your arms (e.g., wheelchair or bedside chair)?: A Little Help needed to walk in hospital room?: A Little Help needed climbing 3-5 steps with a railing? : A Little 6 Click Score: 18    End of Session Equipment Utilized During Treatment: Gait belt Activity Tolerance: Patient limited by fatigue;Patient tolerated treatment well (seated rest breaks between gait bouts and stair bouts) Patient left: in chair;with call bell/phone within reach;with chair alarm set   PT Visit Diagnosis: Unsteadiness on feet (R26.81);Other abnormalities of gait and mobility (R26.89);Muscle weakness (generalized) (M62.81);Difficulty in walking, not elsewhere classified (R26.2);Other symptoms and signs involving the nervous system (R29.898);Hemiplegia and hemiparesis Hemiplegia - Right/Left: Right Hemiplegia - caused by: Cerebral infarction     Time: 4235-3614 PT Time Calculation (min)  (ACUTE ONLY): 47 min  Charges:  $Gait Training: 23-37 mins $Therapeutic Activity: 8-22 mins                     Raymond Gurney, PT, DPT Acute Rehabilitation Services  Pager: [phone] Office: [phone]    Jewel Baize 07/09/2020, 1:26 PM

## 2020-07-09 NOTE — Progress Notes (Addendum)
STROKE TEAM PROGRESS NOTE    Interval History   No acute events overnight. Patient is up in chair with no family at bedside. Pending dispo; CIR versus SNF.   Pertinent Lab Work and Imaging    07/03/20 CT Head WO IV Contrast Mild generalized cerebral atrophy, no acute intracranial hemorrhage, no demarcated cortical infarct, ASPECTS 10   07/03/20 CT Angio Head and Neck W WO IV Contrast CTA Neck:  1. The left ICA becomes occluded shortly beyond its origin and remains occluded throughout the remainder of the neck. 2. High-grade near occlusive stenosis within the proximal right ICA with radiographic string sign at this site. 3. The vertebral arteries are patent within the neck bilaterally. Severe atherosclerotic narrowing at the origin of the left vertebral artery  CTA head: 1. There is reconstitution of the intracranial left ICA at the level of the cavernous segment. However, this vessel remains asymmetrically diminutive. Superimposed moderate/severe atherosclerotic stenosis within the distal cavernous/paraclinoid segment. 2. 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 3. Calcified plaque within the intracranial right ICA with up to moderate stenosis within the cavernous segment.  07/03/20 MRI Brain WO IV Contrast Multiple small acute infarcts within the left frontal and parietal lobes within the left MCA vascular territory. The largest infarct within the left parietal lobe postcentral gyrus measures 2.1 cm. The remaining infarcts are punctate or less than 5 mm.Signal abnormality within the visualized distal cervical and pre cavernous left ICA consistent with vessel occlusion demonstrated at these sites on the prior CTA. A proximal left M2 MCA branch occlusion was better appreciated on this prior study.  07/04/20 Echocardiogram Complete  1. Left ventricular ejection fraction, by estimation, is 70 to 75%. The left ventricle has hyperdynamic function. The left ventricle has no  regional wall motion abnormalities. Left ventricular diastolic parameters are consistent with Grade I diastolic dysfunction (impaired relaxation).  2. Right ventricular systolic function is normal. The right ventricular size is normal.  3. The mitral valve is normal in structure. No evidence of mitral valve regurgitation. No evidence of mitral stenosis.  4. Tricuspid valve regurgitation is moderate.  5. The aortic valve is tricuspid. Aortic valve regurgitation is not  visualized. Mild to moderate aortic valve sclerosis/calcification is  present, without any evidence of aortic stenosis.  6. The inferior vena cava is normal in size with greater than 50%  respiratory variability, suggesting right atrial pressure of 3 mmHg.   Physical Examination   Constitutional: Calm, appropriate for condition  Cardiovascular: Normal RR Respiratory: No increased WOB   Mental status: Oriented to self only, difficult for her to answer LOC questions due to aphasia, is able to follow simple commands  Speech: Dense aphasia, only able to state her first name, naming and repetition are not intact, speech is dysarthric  Cranial nerves: Tracks examiner, UTA VF, Right facial droop, tongue midline Motor: Normal bulk and tone. RUE pronator drift. LUE 4/5, RUE proximal 4/5 but bicep tricep 4-/5, wrist extension 3/5 and finger grip 2+/5. BLEs 4/5 proximal and 5/5 distal symmetrical. Sensory: Unable to assess due to comprehension difficulties, did not induce noxious stimulation  Coordination: UTA  Reflexes: Deferred Gait: Deferred   Assessment and Plan   [name] is a 79 y.o. female w/pmh of cataracts, diabetes who presents with slurred speech, mild facial droop. She was found to have a tandem L ICA and M2 occlusion on her CTA Head and Neck. She did not receive IVTPA but did undergo mechanical thrombectomy with  stenting. Post procedure she developed SAH + parenchymal hemorrhage at the level of the left  insular infarction.   NEURO #L MCA Stroke w/hemorrhagic conversion  Patient presented with the symptoms described above. At this time, her stroke work up is complete. Her MRI revealed small left frontal and parietal lobe strokes. Her Echocardiogram was non revealing for intracardiac source of stroke w/EF 70-75 %. Repeat MRI on 10/21 showed an evolving moderate size L MCA stroke with similar appearance of parenchymal hemorrhage in the left insular region and SAH. Stroke labs were completed including Lipid panel w/LDL 202 and Hemoglobin A1C 6.1. Her stroke etiology is thought to be due to athero embolism given high grade near occlusive stenosis of the proximal R ICA w/string sign.  -Continue Aspirin 81 mg for secondary stroke prevention -Continue Brilinta 90 mg given stenting  -Continue Atorvastatin 80 mg for secondary stroke prevention  -At discharge she will need stroke follow up   #Bilateral Carotid Stenosis  CTA neck - L ICA occlusion beyond origin and remains occluded through neck. High-grade near occlusive stenosis proximal R ICA w/ string sign. L VA  - 07/04/20 s/p Lt ICA stent.  - MRA 07/07/20 - The left ICA remains patent following revascularization.  - Dr. Corliss Skains on board to follow up for R ICA high grade stenosis   CARDS #Hypertension She has a history of HTN and takes Lisinopril-HCTZ 20-12.5 mg QD at home. Currently blood pressure is trending in the 130-150 range. Current BP goal is <160 given high grade stenosis.   - Long term blood pressure goal is 130-150 given ICA stenosis  - Avoid low BP due to right ICA stenosis   #Hyperlipidemia From a stroke prevention stand point, the LDL goal is < 70. Her LDL is 202 and her home Crestor 40 mg QD was transitioned to Atorvastatin 80 mg this admission for better LDL control.  RESP Oxygenating well on RA. No acute respiratory issues at this time  GI #Dysphagia  Originally was recommended to be NPO by ST but then transitioned to a  Dysphagia II Diet with thin liquids. Given poor oral intake she is on IVF NaCI 25 ml/hour. Will re evaluate the need for fluids on a daily basis   RENAL Monitoring chemistries. Last BMP WNL.   ENDO #Diabetes Hemoglobin A1C this admission noted to be 6.1, at goal < 7 from a stroke prevention standpoint.   HEME Hemoglobin hematocrit and platelet count stable   ID Afebrile. No infectious processes at this time.   Hospital day # 6  Stark Jock, NP  [facility] Nurse Practitioner Patient seen and discussed with attending physician Dr. Roda Shutters  ATTENDING NOTE: I reviewed above note and agree with the assessment and plan. Pt was seen and examined.   Patient reclining in the bed, still has expressive aphasia, neurologically unchanged, no acute event overnight, no complaints.  On aspirin and Brilinta, tolerating well.  On statin.  Still low-dose IV fluid, p.o. intake improving.  Pending CIR versus SNF.  Marvel Plan, MD PhD Stroke Neurology 07/09/2020 7:00 PM   To contact Stroke Continuity provider, please refer to [email or web address]. After hours, contact General Neurology

## 2020-07-10 ENCOUNTER — Encounter (HOSPITAL_COMMUNITY): Payer: Self-pay

## 2020-07-10 NOTE — Progress Notes (Signed)
Physical Therapy Treatment Patient Details Name: Sharon Gilbert MRN: [id number] DOB: 05/21/1941 Today's Date: 07/10/2020    History of Present Illness This 78 y.o. female admitted with slurred speech and mild facial droop. 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  She underwent Lt M2 revasularization with telescopic stents and M2 mechanical thrombectomy on 07/04/2020.  Post procudrue she developed Lt SAH.   MRI showed multiple Lt frontal and parietal lobe infarcts.  Repeat CT of head on 9/30 showed increased SAH which extends into the basal cisterns, as well as cytotoxic edema Lt insula and Lt frontal lobes.  PMH includes:  HTN, cataracts     PT Comments    Pt making progress towards her goals by progressing from utilizing [facility] at start of session to not utilizing an assistive device for further gait. However, she continues to display unsteadiness when coming to stand and with gait, placing her at risk for falls. This is supported by her current TUG average time of 27.49 seconds. Her R side weakness, coordination deficits, and balance deficits result in her having R trunk sway and intermittent bouts of LOB to the R during R stance time in gait. Pt continues to be appropriate for CIR at this time as she displays deficits that place her at risk for falls, she is capable of withstanding several hours of therapy, and her current functional status is significantly declined compared to her PLOF. Pt will continue to benefit from acute PT services to address her mentioned deficits to maximize her safety and independence with mobility.  Follow Up Recommendations  CIR;Supervision for mobility/OOB     Equipment Recommendations  3in1 (PT)    Recommendations for Other Services       Precautions / Restrictions Precautions Precautions: Fall Restrictions Weight Bearing Restrictions: No    Mobility  Bed Mobility Overal bed mobility: Needs  Assistance Bed Mobility: Supine to Sit     Supine to sit: Min assist     General bed mobility comments: Bed flat with pt utilizing L bed rail, providing repetitive TC's and VC's to manage LEs off EOB.   Transfers Overall transfer level: Needs assistance Equipment used: Straight cane;None (Utilized SPC initially, progressing to no assistive device) Transfers: Sit to/from Stand Sit to Stand: Min assist         General transfer comment: Pt comes to stand swiftly, requiring assistance to steady self intermittently and occasionally 2 attempts to come to stand.   Ambulation/Gait Ambulation/Gait assistance: Min assist Gait Distance (Feet): 100 Feet (x2 bouts with additional 2 bouts of 20 ft) Assistive device: Straight cane;None Gait Pattern/deviations: Step-through pattern;Decreased step length - right (R trunk sway) Gait velocity: reduced (TUG performed 2x with avergae of 27.49 sec no device) Gait velocity interpretation: <1.31 ft/sec, indicative of household ambulator General Gait Details: Required 1 step commands to sequence utilization of cane with visual, verbal, and tactile cues to attempt 3-point and 2-point gait pattern, with pt moving slowly. Attempted ambulating without assistive device with pt able to ambulate faster with intermittent mod trunk sway to R, x2 minor LOB with minA for safety to regain balance.   Stairs             Wheelchair Mobility    Modified Rankin (Stroke Patients Only) Modified Rankin (Stroke Patients Only) Pre-Morbid Rankin Score: No symptoms Modified Rankin: Moderately severe disability     Balance Overall balance assessment: Needs assistance Sitting-balance support: Feet supported  Sitting balance - Comments: No LOB with sitting EOB.   Standing balance support: Single extremity supported;No upper extremity supported (progressed from [facility] in L UE to no device)   Standing balance comment: Displays bouts of LOB toward R with R LE stance  in gait, requiring minA to recover balance.                            Cognition Arousal/Alertness: Awake/alert Behavior During Therapy: WFL for tasks assessed/performed Overall Cognitive Status: Impaired/Different from baseline Area of Impairment: Following commands                       Following Commands: Follows one step commands consistently;Follows multi-step commands inconsistently       General Comments: Requires step-by-step instruction to coordinate functional mobility or use of assistive device.      Exercises      General Comments        Pertinent Vitals/Pain Pain Assessment: No/denies pain    Home Living                      Prior Function            PT Goals (current goals can now be found in the care plan section) Acute Rehab PT Goals Patient Stated Goal: Pt unable to state  PT Goal Formulation: Patient unable to participate in goal setting Time For Goal Achievement: 07/20/20 Potential to Achieve Goals: Good Progress towards PT goals: Progressing toward goals    Frequency    Min 4X/week      PT Plan Current plan remains appropriate    Co-evaluation     PT goals addressed during session: Mobility/safety with mobility;Balance;Proper use of DME        AM-PAC PT "6 Clicks" Mobility   Outcome Measure  Help needed turning from your back to your side while in a flat bed without using bedrails?: A Little Help needed moving from lying on your back to sitting on the side of a flat bed without using bedrails?: A Little Help needed moving to and from a bed to a chair (including a wheelchair)?: A Little Help needed standing up from a chair using your arms (e.g., wheelchair or bedside chair)?: A Little Help needed to walk in hospital room?: A Little Help needed climbing 3-5 steps with a railing? : A Little 6 Click Score: 18    End of Session Equipment Utilized During Treatment: Gait belt Activity Tolerance: Patient  tolerated treatment well Patient left: in chair;with call bell/phone within reach;with chair alarm set;with family/visitor present (father in room)   PT Visit Diagnosis: Unsteadiness on feet (R26.81);Other abnormalities of gait and mobility (R26.89);Muscle weakness (generalized) (M62.81);Difficulty in walking, not elsewhere classified (R26.2);Other symptoms and signs involving the nervous system (R29.898);Hemiplegia and hemiparesis Hemiplegia - Right/Left: Right Hemiplegia - caused by: Cerebral infarction     Time: 0867-6195 PT Time Calculation (min) (ACUTE ONLY): 24 min  Charges:  $Gait Training: 23-37 mins                     Raymond Gurney, PT, DPT Acute Rehabilitation Services  Pager: [phone] Office: [phone]    Jewel Baize 07/10/2020, 12:03 PM

## 2020-07-10 NOTE — Progress Notes (Signed)
Occupational Therapy Treatment Patient Details Name: Sharon Gilbert MRN: [id number] DOB: 01/24/1941 Today's Date: 07/10/2020   History of present illness This 79 y.o. female admitted with slurred speech and mild facial droop. 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 She underwent Lt M2 revasularization with telescopic stents and M2 mechanical thrombectomy on 07/04/2020. Post procudrue she developed Lt SAH. MRI showed multiple Lt frontal and parietal lobe infarcts. Repeat CT of head on 9/30 showed increased SAH which extends into the basal cisterns, as well as cytotoxic edema Lt insula and Lt frontal lobes. PMH includes: HTN, cataracts   OT comments  Pt pleasant and agreeable to participation with skilled OT services, noted during session with fluctuating command following. Inconsistently initially following 1 step, improved with slowed direction and increased time for processing. Improved with limited options and single word responses. Trial of clock draw and brief visual assessment both consistent with R inattention, with decreased spatial awareness. ?mild field cut to R eye ~45 degrees. Pt with need for Min A for safety with STS and standing ADL tasks and increased A for UB dressing/bathing and toileting tasks while in recliner. At this time pt continues to be excellent candidate for CIR which is recommended below, with OT to continue to follow acutely. Family present and OT completed extensive education on rec at time of d/c.    Follow Up Recommendations  CIR;Supervision/Assistance - 24 hour    Equipment Recommendations   (TBD)    Recommendations for Other Services      Precautions / Restrictions Precautions Precautions: Fall Precaution Comments: R inattention, R weakness, incontinence, expressive and receptive deficits Restrictions Weight Bearing Restrictions: No       Mobility Bed Mobility Pt received seated in recliner on  arrival;   Transfers Overall transfer level: Needs assistance Equipment used: 1 person hand held assist Transfers: Sit to/from Stand Sit to Stand: Min assist Stand pivot transfers: Min assist       General transfer comment:  CGA-min A for safe transition to standing with cues for slowed speed.     Balance        ADL either performed or assessed with clinical judgement   ADL Overall ADL's : Needs assistance/impaired Eating/Feeding: Set up   Grooming: Wash/dry face;Wash/dry hands;Oral care;Minimal assistance;Sitting   Upper Body Bathing: Moderate assistance;Cueing for sequencing;Sitting;Cueing for compensatory techniques       Upper Body Dressing : Moderate assistance;Cueing for compensatory techniques;Cueing for sequencing Upper Body Dressing Details (indicate cue type and reason): doff/donn front and back gown, decreased carry over noted     Toilet Transfer: Minimal assistance;Cueing for sequencing;Cueing for safety   Toileting- Clothing Manipulation and Hygiene: Moderate assistance Toileting - Clothing Manipulation Details (indicate cue type and reason): A for thoroughness, fair sequencing but noted L UE is not dominant side,     Functional mobility during ADLs: Minimal assistance General ADL Comments: Hand hold assist provided during toileting and standing ADL's for steadying, no significant LOB observed, increased cues provided for safety.     Vision Baseline Vision/History: Wears glasses Wears Glasses: Reading only (confirmed by family) Vision Assessment?: Yes (partial assessment) Eye Alignment: Within Functional Limits Ocular Range of Motion: Within Functional Limits Visual Fields: Right visual field deficit (R field deficits to ?45 degrees, inconsistent reports limited by command following and aphasia)   Perception     Praxis      Cognition Arousal/Alertness: Awake/alert Behavior During Therapy: [facility] for tasks assessed/performed  Overall Cognitive Status:  Impaired/Different from baseline Area of Impairment: Following commands                       Following Commands: Follows one step commands consistently       General Comments: OT trialed yes/no reponses to questions, at this time pt with 90% accuracy during exercises for completion of ADL's. able to correctly identfiy written yes/no options as well as provide correct response. immediate verbalizations with fair accuracy i.e. identify color, item. clock draw demos decreased attention to R field/environment, with poor spatial awareness and increased difficulty noted d/t use of non-dom hand.        Exercises General Exercises - Upper Extremity Shoulder Flexion: AAROM;10 reps;Right;Seated Shoulder ABduction: 10 reps;AAROM;Right;Seated Elbow Flexion: 10 reps;AAROM;Right;Seated Elbow Extension: 10 reps;AAROM;Right Wrist Extension: 10 reps;AAROM;Right;Seated   Shoulder Instructions       General Comments      Pertinent Vitals/ Pain       Pain Assessment: No/denies pain  Home Living                                          Prior Functioning/Environment              Frequency  Min 2X/week     Progress Toward Goals  OT Goals(current goals can now be found in the care plan section)   Pt making slow and steady progress towards established goals.   Acute Rehab OT Goals Patient Stated Goal: Pt unable to state   Plan Discharge plan remains appropriate    Co-evaluation         OT goals addressed during session: ADL's and self-care;Other (comment) (visual attention and cognition)      AM-PAC OT "6 Clicks" Daily Activity     Outcome Measure   Help from another person eating meals?: A Little Help from another person taking care of personal grooming?: A Little Help from another person toileting, which includes using toliet, bedpan, or urinal?: Total Help from another person bathing (including washing, rinsing, drying)?: A Lot Help from  another person to put on and taking off regular upper body clothing?: A Little Help from another person to put on and taking off regular lower body clothing?: A Lot 6 Click Score: 14    End of Session Equipment Utilized During Treatment: Gait belt  OT Visit Diagnosis: Cognitive communication deficit (R41.841);Hemiplegia and hemiparesis Hemiplegia - Right/Left: Right   Activity Tolerance Patient tolerated treatment well   Patient Left in chair;with call bell/phone within reach;with chair alarm set;with family/visitor present   Nurse Communication Mobility status    Time: 0973-5329 OT Time Calculation (min): 31 min  Charges: OT General Charges $OT Visit: 1 Visit OT Treatments $Self Care/Home Management : 23-37 mins  Keenya Matera OTR/L acute rehab services Office: [phone]  Wilhemena Durie 07/10/2020, 2:40 PM

## 2020-07-10 NOTE — Progress Notes (Addendum)
STROKE TEAM PROGRESS NOTE    Interval History   No acute events overnight. Patient is up in chair eating lunch with family at bedside.   Pertinent Lab Work and Imaging    07/03/20 CT Head WO IV Contrast Mild generalized cerebral atrophy, no acute intracranial hemorrhage, no demarcated cortical infarct, ASPECTS 10   07/03/20 CT Angio Head and Neck W WO IV Contrast CTA Neck:  1. The left ICA becomes occluded shortly beyond its origin and remains occluded throughout the remainder of the neck. 2. High-grade near occlusive stenosis within the proximal right ICA with radiographic string sign at this site. 3. The vertebral arteries are patent within the neck bilaterally. Severe atherosclerotic narrowing at the origin of the left vertebral artery  CTA head: 1. There is reconstitution of the intracranial left ICA at the level of the cavernous segment. However, this vessel remains asymmetrically diminutive. Superimposed moderate/severe atherosclerotic stenosis within the distal cavernous/paraclinoid segment. 2. 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 3. Calcified plaque within the intracranial right ICA with up to moderate stenosis within the cavernous segment.  07/03/20 MRI Brain WO IV Contrast Multiple small acute infarcts within the left frontal and parietal lobes within the left MCA vascular territory. The largest infarct within the left parietal lobe postcentral gyrus measures 2.1 cm. The remaining infarcts are punctate or less than 5 mm.Signal abnormality within the visualized distal cervical and pre cavernous left ICA consistent with vessel occlusion demonstrated at these sites on the prior CTA. A proximal left M2 MCA branch occlusion was better appreciated on this prior study.  07/04/20 Echocardiogram Complete  1. Left ventricular ejection fraction, by estimation, is 70 to 75%. The left ventricle has hyperdynamic function. The left ventricle has no regional wall motion  abnormalities. Left ventricular diastolic parameters are consistent with Grade I diastolic dysfunction (impaired relaxation).  2. Right ventricular systolic function is normal. The right ventricular size is normal.  3. The mitral valve is normal in structure. No evidence of mitral valve regurgitation. No evidence of mitral stenosis.  4. Tricuspid valve regurgitation is moderate.  5. The aortic valve is tricuspid. Aortic valve regurgitation is not  visualized. Mild to moderate aortic valve sclerosis/calcification is  present, without any evidence of aortic stenosis.  6. The inferior vena cava is normal in size with greater than 50%  respiratory variability, suggesting right atrial pressure of 3 mmHg.   Physical Examination   Constitutional: Calm, appropriate for condition  Cardiovascular: Normal RR Respiratory: No increased WOB   Mental status: Oriented to self only, difficult for Sharon Gilbert to answer LOC questions due to aphasia, follows commands ( closes eyes opens them, closes fist opens it, shows two fingers, shows thumb)   Speech: Dense aphasia, only able to state Sharon Gilbert first name, naming and repetition are not intact, speech is dysarthric  Cranial nerves: Tracks examiner, UTA VF, Right facial droop, tongue midline Motor: Normal bulk and tone. RUE pronator drift. LUE 4/5, RUE proximal 4/5 but bicep tricep 4-/5, wrist extension 3/5 and finger grip 2+/5. BLEs 4/5 proximal and 5/5 distal symmetrical. Sensory: Unable to assess due to comprehension difficulties, did not induce noxious stimulation  Coordination: UTA  Reflexes: Deferred Gait: Deferred   Assessment and Plan   Sharon Gilbert is a 79 y.o. female w/pmh of cataracts, diabetes who presents with slurred speech, mild facial droop. She was found to have a tandem L ICA and M2 occlusion on Sharon Gilbert CTA Head and Neck. She did not receive  IVTPA but did undergo mechanical thrombectomy with stenting. Post procedure she developed SAH +  parenchymal hemorrhage at the level of the left insular infarction.   NEURO #L MCA Stroke w/hemorrhagic conversion  Patient presented with the symptoms described above. At this time, Sharon Gilbert stroke work up is complete. Sharon Gilbert MRI revealed small left frontal and parietal lobe strokes. Sharon Gilbert Echocardiogram was non revealing for intracardiac source of stroke w/EF 70-75 %. Repeat MRI on 10/21 showed an evolving moderate size L MCA stroke with similar appearance of parenchymal hemorrhage in the left insular region and SAH. Stroke labs were completed including Lipid panel w/LDL 202 and Hemoglobin A1C 6.1. Sharon Gilbert stroke etiology is thought to be due to athero embolism given high grade near occlusive stenosis of the proximal R ICA w/string sign.  -Continue Aspirin 81 mg for secondary stroke prevention -Continue Brilinta 90 mg given stenting  -Continue Atorvastatin 80 mg for secondary stroke prevention  -At discharge she will need stroke follow up   #Bilateral Carotid Stenosis  CTA neck - L ICA occlusion beyond origin and remains occluded through neck. High-grade near occlusive stenosis proximal R ICA w/ string sign. L [facility]  - 07/04/20 s/p Lt ICA stent.  - MRA 07/07/20 - The left ICA remains patent following revascularization.  - Dr. Corliss Skains on board to follow up for R ICA high grade stenosis   CARDS #Hypertension She has a history of HTN and takes Lisinopril-HCTZ 20-12.5 mg QD at home. Currently blood pressure is trending in the 130-150 range. Current BP goal is <160 given high grade stenosis.   - Long term blood pressure goal is 130-150 given ICA stenosis  - Avoid low BP due to right ICA stenosis   #Hyperlipidemia From a stroke prevention stand point, the LDL goal is < 70. Sharon Gilbert LDL is 202 and Sharon Gilbert home Crestor 40 mg QD was transitioned to Atorvastatin 80 mg this admission for better LDL control.  RESP Oxygenating well on RA. No acute respiratory issues at this time  GI #Dysphagia  Originally was recommended  to be NPO by ST but then transitioned to a Dysphagia II Diet with thin liquids. Given poor oral intake she is on IVF NaCI 25 ml/hour. Will re evaluate the need for fluids on a daily basis   RENAL Monitoring chemistries. Last BMP WNL.   ENDO #Diabetes Hemoglobin A1C this admission noted to be 6.1, at goal < 7 from a stroke prevention standpoint.   HEME Hemoglobin hematocrit and platelet count stable   ID Afebrile. No infectious processes at this time.   Hospital day # 7  Stark Jock, NP  [facility] Nurse Practitioner Patient seen and discussed with attending physician Dr. Roda Shutters  ATTENDING NOTE: I reviewed above note and agree with the assessment and plan. Pt was seen and examined.   Patient son at bedside.  Patient still has expressive aphasia, right facial droop and right arm weakness.  Neurologically unchanged.  PT/OT recommend CIR.  Son is to discuss with Sharon Gilbert aunt to secure 24/7 supervision after CIR.  Marvel Plan, MD PhD Stroke Neurology 07/10/2020 8:38 AM   To contact Stroke Continuity provider, please refer to [email or web address]. After hours, contact General Neurology

## 2020-07-10 NOTE — Progress Notes (Signed)
Inpatient Rehab Admissions Coordinator:   I met with this Pt. And her son at bedside and discussed potential CIR admission. I have received authorization from pt.'s insurance for CIR and believe she will be able to progress to supervision or mod I level by d/c from CIR. Plan would be for her to go and stay with her sister, who is elderly, and can provide supervision only and family is deciding if they are comfortable with this. Pt.'s son, LC to follow up with a decision by tomorrow.

## 2020-07-10 NOTE — Progress Notes (Signed)
Inpatient Rehab Admissions Coordinator:   I spoke with pt.'s son, Fredrik Cove, who stated that they do wish for Pt. To come to CIR when a bed becomes available.   Megan Salon, MS, CCC-SLP Rehab Admissions Coordinator  [phone] (celll) [phone] (office)

## 2020-07-11 ENCOUNTER — Encounter (HOSPITAL_COMMUNITY): Payer: Self-pay | Admitting: Physical Medicine & Rehabilitation

## 2020-07-11 ENCOUNTER — Other Ambulatory Visit: Payer: Self-pay

## 2020-07-11 ENCOUNTER — Inpatient Hospital Stay (HOSPITAL_COMMUNITY)
Admission: RE | Admit: 2020-07-11 | Discharge: 2020-07-26 | DRG: 057 | Disposition: A | Discharge status: Home Health | Payer: Medicare HMO | Source: Intra-hospital | Attending: Physical Medicine & Rehabilitation | Admitting: Physical Medicine & Rehabilitation

## 2020-07-11 DIAGNOSIS — I6522 Occlusion and stenosis of left carotid artery: Secondary | ICD-10-CM | POA: Diagnosis present

## 2020-07-11 DIAGNOSIS — I69020 Aphasia following nontraumatic subarachnoid hemorrhage: Secondary | ICD-10-CM | POA: Diagnosis not present

## 2020-07-11 DIAGNOSIS — I69098 Other sequelae following nontraumatic subarachnoid hemorrhage: Secondary | ICD-10-CM | POA: Diagnosis not present

## 2020-07-11 DIAGNOSIS — Z6829 Body mass index (BMI) 29.0-29.9, adult: Secondary | ICD-10-CM | POA: Diagnosis not present

## 2020-07-11 DIAGNOSIS — I6523 Occlusion and stenosis of bilateral carotid arteries: Secondary | ICD-10-CM | POA: Diagnosis not present

## 2020-07-11 DIAGNOSIS — I69092 Facial weakness following nontraumatic subarachnoid hemorrhage: Secondary | ICD-10-CM | POA: Diagnosis not present

## 2020-07-11 DIAGNOSIS — Z888 Allergy status to other drugs, medicaments and biological substances status: Secondary | ICD-10-CM | POA: Diagnosis not present

## 2020-07-11 DIAGNOSIS — I69391 Dysphagia following cerebral infarction: Secondary | ICD-10-CM | POA: Diagnosis not present

## 2020-07-11 DIAGNOSIS — Z7982 Long term (current) use of aspirin: Secondary | ICD-10-CM

## 2020-07-11 DIAGNOSIS — E118 Type 2 diabetes mellitus with unspecified complications: Secondary | ICD-10-CM

## 2020-07-11 DIAGNOSIS — R748 Abnormal levels of other serum enzymes: Secondary | ICD-10-CM | POA: Diagnosis not present

## 2020-07-11 DIAGNOSIS — I69090 Apraxia following nontraumatic subarachnoid hemorrhage: Secondary | ICD-10-CM | POA: Diagnosis not present

## 2020-07-11 DIAGNOSIS — I082 Rheumatic disorders of both aortic and tricuspid valves: Secondary | ICD-10-CM | POA: Diagnosis present

## 2020-07-11 DIAGNOSIS — E876 Hypokalemia: Secondary | ICD-10-CM

## 2020-07-11 DIAGNOSIS — D62 Acute posthemorrhagic anemia: Secondary | ICD-10-CM | POA: Diagnosis not present

## 2020-07-11 DIAGNOSIS — R1312 Dysphagia, oropharyngeal phase: Secondary | ICD-10-CM | POA: Diagnosis not present

## 2020-07-11 DIAGNOSIS — I63512 Cerebral infarction due to unspecified occlusion or stenosis of left middle cerebral artery: Secondary | ICD-10-CM | POA: Diagnosis present

## 2020-07-11 DIAGNOSIS — E785 Hyperlipidemia, unspecified: Secondary | ICD-10-CM

## 2020-07-11 DIAGNOSIS — I6521 Occlusion and stenosis of right carotid artery: Secondary | ICD-10-CM

## 2020-07-11 DIAGNOSIS — H409 Unspecified glaucoma: Secondary | ICD-10-CM | POA: Diagnosis present

## 2020-07-11 DIAGNOSIS — I69390 Apraxia following cerebral infarction: Secondary | ICD-10-CM

## 2020-07-11 DIAGNOSIS — Z79899 Other long term (current) drug therapy: Secondary | ICD-10-CM

## 2020-07-11 DIAGNOSIS — H538 Other visual disturbances: Secondary | ICD-10-CM | POA: Diagnosis present

## 2020-07-11 DIAGNOSIS — E663 Overweight: Secondary | ICD-10-CM | POA: Diagnosis present

## 2020-07-11 DIAGNOSIS — G8191 Hemiplegia, unspecified affecting right dominant side: Secondary | ICD-10-CM

## 2020-07-11 DIAGNOSIS — R1310 Dysphagia, unspecified: Secondary | ICD-10-CM | POA: Diagnosis not present

## 2020-07-11 DIAGNOSIS — G3184 Mild cognitive impairment, so stated: Secondary | ICD-10-CM | POA: Diagnosis present

## 2020-07-11 DIAGNOSIS — K5901 Slow transit constipation: Secondary | ICD-10-CM | POA: Diagnosis not present

## 2020-07-11 DIAGNOSIS — R7303 Prediabetes: Secondary | ICD-10-CM | POA: Diagnosis present

## 2020-07-11 DIAGNOSIS — Z23 Encounter for immunization: Secondary | ICD-10-CM | POA: Diagnosis not present

## 2020-07-11 DIAGNOSIS — I63232 Cerebral infarction due to unspecified occlusion or stenosis of left carotid arteries: Secondary | ICD-10-CM | POA: Diagnosis not present

## 2020-07-11 DIAGNOSIS — K5900 Constipation, unspecified: Secondary | ICD-10-CM | POA: Diagnosis not present

## 2020-07-11 DIAGNOSIS — I10 Essential (primary) hypertension: Secondary | ICD-10-CM | POA: Diagnosis present

## 2020-07-11 DIAGNOSIS — R7401 Elevation of levels of liver transaminase levels: Secondary | ICD-10-CM | POA: Diagnosis not present

## 2020-07-11 DIAGNOSIS — Z95820 Peripheral vascular angioplasty status with implants and grafts: Secondary | ICD-10-CM | POA: Diagnosis not present

## 2020-07-11 DIAGNOSIS — I952 Hypotension due to drugs: Secondary | ICD-10-CM | POA: Diagnosis not present

## 2020-07-11 DIAGNOSIS — R7309 Other abnormal glucose: Secondary | ICD-10-CM

## 2020-07-11 DIAGNOSIS — I69051 Hemiplegia and hemiparesis following nontraumatic subarachnoid hemorrhage affecting right dominant side: Principal | ICD-10-CM

## 2020-07-11 DIAGNOSIS — I6602 Occlusion and stenosis of left middle cerebral artery: Secondary | ICD-10-CM | POA: Diagnosis not present

## 2020-07-11 DIAGNOSIS — L723 Sebaceous cyst: Secondary | ICD-10-CM | POA: Diagnosis present

## 2020-07-11 DIAGNOSIS — I69091 Dysphagia following nontraumatic subarachnoid hemorrhage: Secondary | ICD-10-CM

## 2020-07-11 DIAGNOSIS — Z8679 Personal history of other diseases of the circulatory system: Secondary | ICD-10-CM | POA: Diagnosis not present

## 2020-07-11 LAB — GLUCOSE, CAPILLARY: Glucose-Capillary: 109 mg/dL — ABNORMAL HIGH (ref 70–99)

## 2020-07-11 MED ORDER — HYDROCHLOROTHIAZIDE 12.5 MG PO CAPS
12.5000 mg | ORAL_CAPSULE | Freq: Every day | ORAL | Status: DC
  Administered 2020-07-12 – 2020-07-13 (×2): 12.5 mg via ORAL
  Filled 2020-07-11 (×2): qty 1

## 2020-07-11 MED ORDER — ATORVASTATIN CALCIUM 80 MG PO TABS: 80.0000 mg | ORAL_TABLET | Freq: Every day | ORAL | Status: DC

## 2020-07-11 MED ORDER — FLEET ENEMA 7-19 GM/118ML RE ENEM: 1.0000 | ENEMA | Freq: Once | RECTAL | Status: DC | PRN

## 2020-07-11 MED ORDER — LATANOPROST 0.005 % OP SOLN
1.0000 [drp] | Freq: Every day | OPHTHALMIC | Status: DC
  Administered 2020-07-11 – 2020-07-25 (×15): 1 [drp] via OPHTHALMIC
  Filled 2020-07-11: qty 2.5

## 2020-07-11 MED ORDER — GUAIFENESIN-DM 100-10 MG/5ML PO SYRP: 5.0000 mL | ORAL_SOLUTION | Freq: Four times a day (QID) | ORAL | Status: DC | PRN

## 2020-07-11 MED ORDER — INSULIN ASPART 100 UNIT/ML ~~LOC~~ SOLN: 0.0000 [IU] | Freq: Every day | SUBCUTANEOUS | Status: DC

## 2020-07-11 MED ORDER — ATORVASTATIN CALCIUM 80 MG PO TABS
80.0000 mg | ORAL_TABLET | Freq: Every day | ORAL | Status: DC
  Administered 2020-07-11 – 2020-07-25 (×15): 80 mg via ORAL
  Filled 2020-07-11 (×15): qty 1

## 2020-07-11 MED ORDER — ALUM & MAG HYDROXIDE-SIMETH 200-200-20 MG/5ML PO SUSP: 30.0000 mL | ORAL | Status: DC | PRN

## 2020-07-11 MED ORDER — DIPHENHYDRAMINE HCL 12.5 MG/5ML PO ELIX: 12.5000 mg | ORAL_SOLUTION | Freq: Four times a day (QID) | ORAL | Status: DC | PRN

## 2020-07-11 MED ORDER — ASPIRIN 81 MG PO CHEW: 81.0000 mg | CHEWABLE_TABLET | Freq: Every day | ORAL | Status: DC

## 2020-07-11 MED ORDER — TICAGRELOR 90 MG PO TABS
90.0000 mg | ORAL_TABLET | Freq: Two times a day (BID) | ORAL | Status: DC
  Administered 2020-07-11 – 2020-07-26 (×29): 90 mg via ORAL
  Filled 2020-07-11 (×30): qty 1

## 2020-07-11 MED ORDER — ASPIRIN 81 MG PO CHEW
81.0000 mg | CHEWABLE_TABLET | Freq: Every day | ORAL | Status: DC
  Administered 2020-07-12 – 2020-07-26 (×15): 81 mg via ORAL
  Filled 2020-07-11 (×15): qty 1

## 2020-07-11 MED ORDER — TROLAMINE SALICYLATE 10 % EX CREA: TOPICAL_CREAM | Freq: Two times a day (BID) | CUTANEOUS | Status: DC

## 2020-07-11 MED ORDER — DORZOLAMIDE HCL-TIMOLOL MAL 2-0.5 % OP SOLN: 1.0000 [drp] | Freq: Two times a day (BID) | OPHTHALMIC | 12 refills | Status: AC

## 2020-07-11 MED ORDER — PROCHLORPERAZINE 25 MG RE SUPP: 12.5000 mg | Freq: Four times a day (QID) | RECTAL | Status: DC | PRN

## 2020-07-11 MED ORDER — INSULIN ASPART 100 UNIT/ML ~~LOC~~ SOLN
0.0000 [IU] | Freq: Three times a day (TID) | SUBCUTANEOUS | Status: DC
  Administered 2020-07-15 – 2020-07-18 (×3): 1 [IU] via SUBCUTANEOUS
  Administered 2020-07-18: 2 [IU] via SUBCUTANEOUS
  Administered 2020-07-24: 1 [IU] via SUBCUTANEOUS

## 2020-07-11 MED ORDER — DORZOLAMIDE HCL-TIMOLOL MAL 2-0.5 % OP SOLN
1.0000 [drp] | Freq: Two times a day (BID) | OPHTHALMIC | Status: DC
  Administered 2020-07-11 – 2020-07-25 (×29): 1 [drp] via OPHTHALMIC
  Filled 2020-07-11 (×2): qty 10

## 2020-07-11 MED ORDER — ENOXAPARIN SODIUM 40 MG/0.4ML ~~LOC~~ SOLN
40.0000 mg | SUBCUTANEOUS | Status: DC
  Administered 2020-07-12 – 2020-07-26 (×15): 40 mg via SUBCUTANEOUS
  Filled 2020-07-11 (×15): qty 0.4

## 2020-07-11 MED ORDER — ASPIRIN 81 MG PO CHEW
81.0000 mg | CHEWABLE_TABLET | Freq: Every day | ORAL | Status: DC
  Filled 2020-07-11 (×6): qty 1

## 2020-07-11 MED ORDER — BISACODYL 10 MG RE SUPP
10.0000 mg | Freq: Every day | RECTAL | Status: DC | PRN
  Administered 2020-07-14: 10 mg via RECTAL
  Filled 2020-07-11: qty 1

## 2020-07-11 MED ORDER — ENOXAPARIN SODIUM 40 MG/0.4ML ~~LOC~~ SOLN: 40.0000 mg | SUBCUTANEOUS | Status: DC

## 2020-07-11 MED ORDER — TICAGRELOR 90 MG PO TABS: 90.0000 mg | ORAL_TABLET | Freq: Two times a day (BID) | ORAL | Status: DC

## 2020-07-11 MED ORDER — POLYETHYLENE GLYCOL 3350 17 G PO PACK
17.0000 g | PACK | Freq: Every day | ORAL | Status: DC | PRN
  Administered 2020-07-14 – 2020-07-15 (×2): 17 g via ORAL
  Filled 2020-07-11 (×2): qty 1

## 2020-07-11 MED ORDER — TICAGRELOR 90 MG PO TABS
90.0000 mg | ORAL_TABLET | Freq: Two times a day (BID) | ORAL | Status: DC
  Administered 2020-07-20: 90 mg
  Filled 2020-07-11 (×10): qty 1

## 2020-07-11 MED ORDER — ACETAMINOPHEN 325 MG PO TABS: 650.0000 mg | ORAL_TABLET | ORAL | Status: DC | PRN

## 2020-07-11 MED ORDER — MUSCLE RUB 10-15 % EX CREA
TOPICAL_CREAM | Freq: Two times a day (BID) | CUTANEOUS | Status: DC
  Administered 2020-07-13 – 2020-07-14 (×2): 1 via TOPICAL
  Filled 2020-07-11 (×2): qty 85

## 2020-07-11 MED ORDER — LISINOPRIL 20 MG PO TABS
20.0000 mg | ORAL_TABLET | Freq: Every day | ORAL | Status: DC
  Administered 2020-07-12 – 2020-07-14 (×3): 20 mg via ORAL
  Filled 2020-07-11 (×4): qty 1

## 2020-07-11 MED ORDER — ACETAMINOPHEN 325 MG PO TABS
325.0000 mg | ORAL_TABLET | ORAL | Status: DC | PRN
  Administered 2020-07-15 – 2020-07-24 (×2): 650 mg via ORAL
  Filled 2020-07-11 (×2): qty 2

## 2020-07-11 MED ORDER — LATANOPROST 0.005 % OP SOLN: 1.0000 [drp] | Freq: Every day | OPHTHALMIC | 12 refills | Status: AC

## 2020-07-11 MED ORDER — PROCHLORPERAZINE MALEATE 5 MG PO TABS: 5.0000 mg | ORAL_TABLET | Freq: Four times a day (QID) | ORAL | Status: DC | PRN

## 2020-07-11 MED ORDER — PROCHLORPERAZINE EDISYLATE 10 MG/2ML IJ SOLN: 5.0000 mg | Freq: Four times a day (QID) | INTRAMUSCULAR | Status: DC | PRN

## 2020-07-11 NOTE — Care Management Important Message (Signed)
Important Message  Patient Details  Name: Sharon Gilbert MRN: [id number] Date of Birth: [birth year]   Medicare Important Message Given:  Yes     [name] 07/11/2020, 10:08 AM

## 2020-07-11 NOTE — Progress Notes (Signed)
Inpatient Rehabilitation Medication Review by a Pharmacist  A complete drug regimen review was completed for this patient to identify any potential clinically significant medication issues.  Clinically significant medication issues were identified:  no  Check AMION for pharmacist assigned to patient if future medication questions/issues arise during this admission.   Time spent performing this drug regimen review (minutes):  10   Noah Delaine, [place] Clinical Pharmacist 07/11/2020 5:40 PM

## 2020-07-11 NOTE — H&P (Signed)
Physical Medicine and Rehabilitation Admission H&P    Chief Complaint  Patient presents with  . Stroke with functional deficits.     HPI:  Sharon Gilbert is an 79 year old RH-female with history of T2DM and glaucoma who was admitted on 07/03/20 with slurred speech. CTA head/neck showed occlusive stenosis of proximal R-ICA with string sign, L-ICA occlusion beyound origin and throughout the neck. CT perfusion with 31 ml penumbra and and without core infarct. Follow up MRI brain done revealing multiple small infarcts in left frontal and parietal lobes. She developed right sided weakness overnight with onset of of aphasia and underwent cerebral angio with complete revascularization of proximal L-ICA with stent angioplasty and complete revascularization of L-MCA M1 occlusion.  Follow up CT head showed mild to moderate SAH due to hemorrhagic conversion of L-MCA infarct. She was kept intubated till 09/30. Follow up CT head showed cytotoxic edema from insular cortex to frontal region and increase in [facility] --neurology recommended SBP goals 120-140 range.   2D echo done revealing EF 70-75% with moderate TVR and mild to moderate AV sclerosis.  She was started on dysphagia 2, thin liquids due to prolonged mastication and mild right sided pocketing but intake has been poor requiring IVF for hydration. She continued to have RUE weakness and follow up MRI/MRA 10/02 showed evolving moderate acute L-MCA infarct with no change in IPH/SAH and minimal rightward midline shift and prior occluded left M2 branch of ICA now patent.  Dr. 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 Dr.Deveshwar to follow up on R-ICA stenosis.long term BP goal 130-150 range given ICA stenosis.  Patient with resultant right facial droop with expressive deficits, difficulty with multi-step commands, question of right visual field deficits per OT,   dysphagia and RUE  weakness affecting ADLs and mobility. CIR recommended due to functional decline.     Review of Systems  Unable to perform ROS: Language  Musculoskeletal: Positive for myalgias (right shoulder/back issues per records review).  Neurological: Positive for speech change and focal weakness.      Past Medical History:  Diagnosis Date  . Cataract   . Diabetes mellitus without complication [facility])     Past Surgical History:  Procedure Laterality Date  . IR CT HEAD LTD  07/04/2020  . IR INTRAVSC STENT CERV CAROTID W/O EMB-PROT MOD SED INC ANGIO  07/04/2020  . IR PERCUTANEOUS ART THROMBECTOMY/INFUSION INTRACRANIAL INC DIAG ANGIO  07/04/2020  . RADIOLOGY WITH ANESTHESIA N/A 07/04/2020   Procedure: IR WITH ANESTHESIA;  Surgeon: Radiologist, Medication, MD;  Location: [facility] OR;  Service: Radiology;  Laterality: N/A;    Family History: Unable to elicit due to aphasia.    Social History:  Widowed. Was still working as a Wellsite geologist. Has a son who lives in [place] and other children live out of state. Plans for elderly sister to provide supervision after discharge?  Per reports that she has never smoked. She has never used smokeless tobacco. She reports that she does not drink alcohol and does not use drugs.   Allergies  Allergen Reactions  . Lipitor [Atorvastatin] Other (See Comments)    Muscle pain  . Topiramate Other (See Comments)    Vomiting & Wheezing    Medications Prior to Admission  Medication Sig Dispense Refill  . aspirin EC 81 MG tablet Take 81 mg by mouth daily.    . ciclopirox (PENLAC) 8 % solution Apply  topically daily.    . dorzolamide-timolol (COSOPT) 22.3-6.8 MG/ML ophthalmic solution Place 1 drop into both eyes daily.     . hydrOXYzine (ATARAX/VISTARIL) 25 MG tablet Take 0.5 tablets (12.5 mg total) by mouth every 8 (eight) hours as needed. 12 tablet 0  . latanoprost (XALATAN) 0.005 % ophthalmic solution Place 1 drop into both eyes every evening.     Marland Kitchen  lisinopril-hydrochlorothiazide (PRINZIDE,ZESTORETIC) 20-12.5 MG tablet Take 1 tablet by mouth daily.   3  . rosuvastatin (CRESTOR) 40 MG tablet Take 40 mg by mouth daily.    Marland Kitchen topiramate (TOPAMAX) 25 MG tablet Take 1 tablet (25 mg total) by mouth 2 (two) times daily. 60 tablet 2  . fluticasone (FLONASE) 50 MCG/ACT nasal spray Place 2 sprays into both nostrils daily. (Patient not taking: Reported on 07/06/2020) 16 g 2    Drug Regimen Review  Drug regimen was reviewed and remains appropriate with no significant issues identified  Home: Home Living Family/patient expects to be discharged to:: Private residence Living Arrangements: Alone Available Help at Discharge:  (uncertain) Type of Home: House Home Access: Stairs to enter [employer] of Steps: 5 Entrance Stairs-Rails: Left, Right Home Layout: One level Home Equipment: None Additional Comments: pt indicates that her son lives with her   Lives With: Alone   Functional History: Prior Function Level of Independence: Independent Comments: Per RN, 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  Transfers Overall transfer level: Needs assistance Equipment used: 1 person hand held assist Transfers: Sit to/from Stand Sit to Stand: Min assist Stand pivot transfers: Min assist General transfer comment: Pt comes to stand swiftly, requiring assistance to steady self intermittently and occasionally 2 attempts to come to stand.  Ambulation/Gait Ambulation/Gait assistance: Min assist Gait Distance (Feet): 100 Feet (x2 bouts with additional 2 bouts of 20 ft) Assistive device: Straight cane, None Gait Pattern/deviations: Step-through pattern, Decreased step length  - right (R trunk sway) General Gait Details: Required 1 step commands to sequence utilization of cane with visual, verbal, and tactile cues to attempt 3-point and 2-point gait pattern, with pt moving slowly. Attempted ambulating without assistive device with pt able to ambulate faster with intermittent mod trunk sway to R, x2 minor LOB with minA for safety to regain balance. Gait velocity: reduced (TUG performed 2x with avergae of 27.49 sec no device) Gait velocity interpretation: <1.31 ft/sec, indicative of household ambulator Stairs: Yes Stairs assistance: Min assist Stair Management: Two rails, Step to pattern Number of Stairs: 6 (on ~6 inch high steps in gym) General stair comments: Ascending and descending with step-to gait pattern and use of B hand rails, displaying unsteadiness and requiring extra time to complete esp when descending. HOH assistance to maintain R hand grip on rail, providing visual and verbal cues to ascend leading with L and descend leading with R. Cued pt to bring B UEs with her as she negotiated stairs. Provided faded feedback with these steps, with good carryover and learning noted but pt still requiring assistance for safety due to unsteadiness.    ADL: ADL Overall ADL's : Needs assistance/impaired Eating/Feeding: Set up Grooming: Wash/dry face, Wash/dry hands, Oral care, Minimal assistance, Sitting Upper Body Bathing: Moderate assistance, Cueing for sequencing, Sitting, Cueing  for compensatory techniques Lower Body Bathing: Moderate assistance, Sit to/from stand Upper Body Dressing : Moderate assistance, Cueing for compensatory techniques, Cueing for sequencing Upper Body Dressing Details (indicate cue type and reason): doff/donn front and back gown, decreased carry over noted Lower Body Dressing: Maximal assistance, Sit to/from stand Lower Body Dressing Details (indicate cue type and reason): Pt able to doff socks with close min guard assist in supported sitting,  and was able to don Rt sock with min A.  She went through the motions of preparing the sock to don it on the Lt sock, however, there was nothing in her hands.  She did this repetitively with no awareness.  When cued, she was able to locate her sock, and she donnned it with mod A  Toilet Transfer: Minimal assistance, Cueing for sequencing, Cueing for safety Toileting- Clothing Manipulation and Hygiene: Moderate assistance Toileting - Clothing Manipulation Details (indicate cue type and reason): A for thoroughness, fair sequencing but noted L UE is not dominant side, Functional mobility during ADLs: Minimal assistance General ADL Comments: Hand hold assist provided during toileting and standing ADL's for steadying, no significant LOB observed, increased cues provided for safety.  Cognition: Cognition Overall Cognitive Status: Impaired/Different from baseline Orientation Level: Oriented to person, Oriented to place Cognition Arousal/Alertness: Awake/alert Behavior During Therapy: WFL for tasks assessed/performed Overall Cognitive Status: Impaired/Different from baseline Area of Impairment: Following commands Following Commands: Follows one step commands consistently General Comments: OT trialed yes/no reponses to questions, at this time pt with 90% accuracy during exercises for completion of ADL's. able to correctly identfiy written yes/no options as well as provide correct response. immediate verbalizations with fair accuracy i.e. identify color, item. clock draw demos decreased attention to R field/environment, with poor spatial awareness and increased difficulty noted d/t use of non-dom hand. Difficult to assess due to: Impaired communication   Blood pressure 139/73, pulse 74, temperature 98.6 F (37 C), temperature source Oral, resp. rate 18, height 5\' 5"  (1.651 m), weight 80.5 kg, SpO2 100 %. Physical Exam General: Alert, No apparent distress HEENT: Head is normocephalic, atraumatic,  PERRLA, EOMI, sclera anicteric, oral mucosa pink and moist, dentition intact, ext ear canals clear,  Neck: Supple without JVD or lymphadenopathy Heart: Reg rate and rhythm. No murmurs rubs or gallops Chest: CTA bilaterally without wheezes, rales, or rhonchi; no distress Abdomen: Soft, non-tender, non-distended, bowel sounds positive. Extremities: No clubbing, cyanosis, or edema. Pulses are 2+ Skin: Clean and intact without signs of breakdown Neuro: AOx1 (self). Global aphasia. Right facial weakness. Speech limited to jargon with occasional appropriate 1-2 words but able to use gestures. RUE weakness with apraxia. She was able to follow simple one and two step commands with cues. RUE: 4/5 SA, 4-/5 EF, EE, 3/5 WE, 2/5 hand grip RLE: 4/5 Left side 5/5 Decreased sensation throughout right side Psych: Pt's affect is bright and pleasant. Pt is cooperative  No results found for this or any previous visit (from the past 48 hour(s)). No results found.  Medical Problem List and Plan: 1.  Impaired mobility and ADLs secondary to   -patient may shower  -ELOS/Goals: 15- 20 days 2.  L-ICA stent/Antithrombotics: -DVT/anticoagulation:  Pharmaceutical: Lovenox 40mg  q24H.   -antiplatelet therapy: ON ASA/Brilinta 3. Pain Management: N/A 4. Mood: LCSW to follow for evaluation and support.   -antipsychotic agents: N/A 5. Neuropsych: This patient may be capable of making decisions on her own behalf--limited by aphasia. 6. Skin/Wound Care: Routine pressure relief measures.  7. Fluids/Electrolytes/Nutrition: Monitor I/O.  Check lytes in am.  8. HTN: Monitor BP TID---to avoid hypotension given high graded R-ICA stenosis. Per neurology SBP goals 130-150 range. Continue Lisinopril/HCTZ daily. Well controlled 9. T2DM- diet controlled?: Hgb A1C-6.1. Will monitor BS ac/hs and use SSI for elevated BS as indicated.  10. Dyslipidemia: On Zocoer 80 mg.  11. Intermittent Hypokalemia: Likely due to IVF and was being  supplemented.  12. ABLA: H/H recovering  12.9--->10.1--->11.1 13. Glaucoma: Stable on Xalatan and Cosopt.   14. Overweight (BMI 29.53): provide dietary counseling  Jacquelynn Cree, PA-C 07/11/2020   I have personally performed a face to face diagnostic evaluation, including, but not limited to relevant history and physical exam findings, of this patient and developed relevant assessment and plan.  Additionally, I have reviewed and concur with the physician assistant's documentation above.  Sula Soda, MD

## 2020-07-11 NOTE — H&P (Addendum)
Physical Medicine and Rehabilitation Admission H&P    Chief Complaint  Patient presents with  . Stroke with functional deficits.     HPI:  Sharon Gilbert is an 79 year old RH-female with history of T2DM and glaucoma who was admitted on 07/03/20 with slurred speech. CTA head/neck showed occlusive stenosis of proximal R-ICA with string sign, L-ICA occlusion beyound origin and throughout the neck. CT perfusion with 31 ml penumbra and and without core infarct. Follow up MRI brain done revealing multiple small infarcts in left frontal and parietal lobes. She developed right sided weakness overnight with onset of of aphasia and underwent cerebral angio with complete revascularization of proximal L-ICA with stent angioplasty and complete revascularization of L-MCA M1 occlusion.  Follow up CT head showed mild to moderate SAH due to hemorrhagic conversion of L-MCA infarct. She was kept intubated till 09/30. Follow up CT head showed cytotoxic edema from insular cortex to frontal region and increase in [facility] --neurology recommended SBP goals 120-140 range.   2D echo done revealing EF 70-75% with moderate TVR and mild to moderate AV sclerosis.  She was started on dysphagia 2, thin liquids due to prolonged mastication and mild right sided pocketing but intake has been poor requiring IVF for hydration. She continued to have RUE weakness and follow up MRI/MRA 10/02 showed evolving moderate acute L-MCA infarct with no change in IPH/SAH and minimal rightward midline shift and prior occluded left M2 branch of ICA now patent.  Dr. 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 Dr.Deveshwar to follow up on R-ICA stenosis.long term BP goal 130-150 range given ICA stenosis.  Patient with resultant right facial droop with expressive deficits, difficulty with multi-step commands, question of right visual field deficits per OT,   dysphagia and RUE  weakness affecting ADLs and mobility. CIR recommended due to functional decline.     Review of Systems  Unable to perform ROS: Language  Musculoskeletal: Positive for myalgias (right shoulder/back issues per records review).  Neurological: Positive for speech change and focal weakness.      Past Medical History:  Diagnosis Date  . Cataract   . Diabetes mellitus without complication [facility])     Past Surgical History:  Procedure Laterality Date  . IR CT HEAD LTD  07/04/2020  . IR INTRAVSC STENT CERV CAROTID W/O EMB-PROT MOD SED INC ANGIO  07/04/2020  . IR PERCUTANEOUS ART THROMBECTOMY/INFUSION INTRACRANIAL INC DIAG ANGIO  07/04/2020  . RADIOLOGY WITH ANESTHESIA N/A 07/04/2020   Procedure: IR WITH ANESTHESIA;  Surgeon: Radiologist, Medication, MD;  Location: [facility] OR;  Service: Radiology;  Laterality: N/A;    Family History: Unable to elicit due to aphasia.    Social History:  Widowed. Was still working as a Wellsite geologist. Has a son who lives in [place] and other children live out of state. Plans for elderly sister to provide supervision after discharge?  Per reports that she has never smoked. She has never used smokeless tobacco. She reports that she does not drink alcohol and does not use drugs.   Allergies  Allergen Reactions  . Lipitor [Atorvastatin] Other (See Comments)    Muscle pain  . Topiramate Other (See Comments)    Vomiting & Wheezing    Medications Prior to Admission  Medication Sig Dispense Refill  . aspirin EC 81 MG tablet Take 81 mg by mouth daily.    . ciclopirox (PENLAC) 8 % solution Apply  topically daily.    . dorzolamide-timolol (COSOPT) 22.3-6.8 MG/ML ophthalmic solution Place 1 drop into both eyes daily.     . hydrOXYzine (ATARAX/VISTARIL) 25 MG tablet Take 0.5 tablets (12.5 mg total) by mouth every 8 (eight) hours as needed. 12 tablet 0  . latanoprost (XALATAN) 0.005 % ophthalmic solution Place 1 drop into both eyes every evening.     Marland Kitchen  lisinopril-hydrochlorothiazide (PRINZIDE,ZESTORETIC) 20-12.5 MG tablet Take 1 tablet by mouth daily.   3  . rosuvastatin (CRESTOR) 40 MG tablet Take 40 mg by mouth daily.    Marland Kitchen topiramate (TOPAMAX) 25 MG tablet Take 1 tablet (25 mg total) by mouth 2 (two) times daily. 60 tablet 2  . fluticasone (FLONASE) 50 MCG/ACT nasal spray Place 2 sprays into both nostrils daily. (Patient not taking: Reported on 07/06/2020) 16 g 2    Drug Regimen Review  Drug regimen was reviewed and remains appropriate with no significant issues identified  Home: Home Living Family/patient expects to be discharged to:: Private residence Living Arrangements: Alone Available Help at Discharge:  (uncertain) Type of Home: House Home Access: Stairs to enter [employer] of Steps: 5 Entrance Stairs-Rails: Left, Right Home Layout: One level Home Equipment: None Additional Comments: pt indicates that her son lives with her   Lives With: Alone   Functional History: Prior Function Level of Independence: Independent Comments: Per RN, 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  Transfers Overall transfer level: Needs assistance Equipment used: 1 person hand held assist Transfers: Sit to/from Stand Sit to Stand: Min assist Stand pivot transfers: Min assist General transfer comment: Pt comes to stand swiftly, requiring assistance to steady self intermittently and occasionally 2 attempts to come to stand.  Ambulation/Gait Ambulation/Gait assistance: Min assist Gait Distance (Feet): 100 Feet (x2 bouts with additional 2 bouts of 20 ft) Assistive device: Straight cane, None Gait Pattern/deviations: Step-through pattern, Decreased step length  - right (R trunk sway) General Gait Details: Required 1 step commands to sequence utilization of cane with visual, verbal, and tactile cues to attempt 3-point and 2-point gait pattern, with pt moving slowly. Attempted ambulating without assistive device with pt able to ambulate faster with intermittent mod trunk sway to R, x2 minor LOB with minA for safety to regain balance. Gait velocity: reduced (TUG performed 2x with avergae of 27.49 sec no device) Gait velocity interpretation: <1.31 ft/sec, indicative of household ambulator Stairs: Yes Stairs assistance: Min assist Stair Management: Two rails, Step to pattern Number of Stairs: 6 (on ~6 inch high steps in gym) General stair comments: Ascending and descending with step-to gait pattern and use of B hand rails, displaying unsteadiness and requiring extra time to complete esp when descending. HOH assistance to maintain R hand grip on rail, providing visual and verbal cues to ascend leading with L and descend leading with R. Cued pt to bring B UEs with her as she negotiated stairs. Provided faded feedback with these steps, with good carryover and learning noted but pt still requiring assistance for safety due to unsteadiness.  ADL: ADL Overall ADL's : Needs assistance/impaired Eating/Feeding: Set up Grooming: Wash/dry face, Wash/dry hands, Oral care, Minimal assistance, Sitting Upper Body Bathing: Moderate assistance, Cueing for sequencing, Sitting, Cueing for compensatory  techniques Lower Body Bathing: Moderate assistance, Sit to/from stand Upper Body Dressing : Moderate assistance, Cueing for compensatory techniques, Cueing for sequencing Upper Body Dressing Details (indicate cue type and reason): doff/donn front and back gown, decreased carry over noted Lower Body Dressing: Maximal assistance, Sit to/from stand Lower Body Dressing Details (indicate cue type and reason): Pt able to doff socks with close min guard assist in supported sitting, and  was able to don Rt sock with min A.  She went through the motions of preparing the sock to don it on the Lt sock, however, there was nothing in her hands.  She did this repetitively with no awareness.  When cued, she was able to locate her sock, and she donnned it with mod A  Toilet Transfer: Minimal assistance, Cueing for sequencing, Cueing for safety Toileting- Clothing Manipulation and Hygiene: Moderate assistance Toileting - Clothing Manipulation Details (indicate cue type and reason): A for thoroughness, fair sequencing but noted L UE is not dominant side, Functional mobility during ADLs: Minimal assistance General ADL Comments: Hand hold assist provided during toileting and standing ADL's for steadying, no significant LOB observed, increased cues provided for safety.  Cognition: Cognition Overall Cognitive Status: Impaired/Different from baseline Orientation Level: Oriented to person, Oriented to place Cognition Arousal/Alertness: Awake/alert Behavior During Therapy: WFL for tasks assessed/performed Overall Cognitive Status: Impaired/Different from baseline Area of Impairment: Following commands Following Commands: Follows one step commands consistently General Comments: OT trialed yes/no reponses to questions, at this time pt with 90% accuracy during exercises for completion of ADL's. able to correctly identfiy written yes/no options as well as provide correct response. immediate verbalizations with fair accuracy i.e. identify color, item. clock draw demos decreased attention to R field/environment, with poor spatial awareness and increased difficulty noted d/t use of non-dom hand. Difficult to assess due to: Impaired communication   Blood pressure (!) 160/68, pulse 61, temperature 98.3 F (36.8 C), temperature source Oral, resp. rate 18, height 5\' 5"  (1.651 m), SpO2 100 %. Physical Exam General: Alert, No apparent distress HEENT: Head is normocephalic, atraumatic, PERRLA, EOMI,  sclera anicteric, oral mucosa pink and moist, dentition intact, ext ear canals clear,  Neck: Supple without JVD or lymphadenopathy Heart: Reg rate and rhythm. No murmurs rubs or gallops Chest: CTA bilaterally without wheezes, rales, or rhonchi; no distress Abdomen: Soft, non-tender, non-distended, bowel sounds positive. Extremities: No clubbing, cyanosis, or edema. Pulses are 2+ Skin: Clean and intact without signs of breakdown Neuro: AOx1 (self). Global aphasia. Right facial weakness. Speech limited to jargon with occasional appropriate 1-2 words but able to use gestures. RUE weakness with apraxia. She was able to follow simple one and two step commands with cues. RUE: 4/5 SA, 4-/5 EF, EE, 3/5 WE, 2/5 hand grip RLE: 4/5 Left side 5/5 Decreased sensation throughout right side Psych: Pt's affect is bright and pleasant. Pt is cooperative  No results found for this or any previous visit (from the past 48 hour(s)). No results found.  Medical Problem List and Plan: 1.  Impaired mobility and ADLs secondary to acute ischemic left ICA stroke  -patient may shower  -ELOS/Goals: 15- 20 days 2.  L-ICA stent/Antithrombotics: -DVT/anticoagulation:  Pharmaceutical: Lovenox 40mg  q24H.   -antiplatelet therapy: ON ASA/Brilinta 3. Pain Management: N/A 4. Mood: LCSW to follow for evaluation and support.   -antipsychotic agents: N/A 5. Neuropsych: This patient may be capable of making decisions on her own behalf--limited by aphasia. 6. Skin/Wound Care: Routine pressure relief measures.  7. Fluids/Electrolytes/Nutrition: Monitor I/O.  Check lytes in am.  8. HTN: Monitor BP TID---to avoid hypotension given high graded R-ICA stenosis. Per neurology SBP goals 130-150 range. Continue Lisinopril/HCTZ daily. Well controlled 9. T2DM- diet controlled?: Hgb A1C-6.1. Will monitor BS ac/hs and use SSI for elevated BS as indicated.  10. Dyslipidemia: On Zocoer 80 mg.  11. Intermittent Hypokalemia: Likely due to IVF  and was being supplemented.  12. ABLA: H/H recovering  12.9--->10.1--->11.1 13. Glaucoma: Stable on Xalatan and Cosopt.   14. Overweight (BMI 29.53): provide dietary counseling  Pamela S Jacquelynn CreeLove, PA-C 07/11/2020   I have personally performed a face to face diagnostic evaluation, including, but not limited to relevant history and physical exam findings, of this patient and developed relevant assessment and plan.  Additionally, I have reviewed and concur with the physician assistant's documentation above.  The patient's status has not changed. The original post admission physician evaluation remains appropriate, and any changes from the pre-admission screening or documentation from the acute chart are noted above.   Sula SodaKrutika Jourdan Maldonado, MD

## 2020-07-11 NOTE — Progress Notes (Signed)
Physical Therapy Treatment Patient Details Name: Sharon Gilbert MRN: [id number] DOB: Dec 26, 1940 Today's Date: 07/11/2020    History of Present Illness This 79 y.o. female admitted with slurred speech and mild facial droop. 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 She underwent Lt M2 revasularization with telescopic stents and M2 mechanical thrombectomy on 07/04/2020. Post procudrue she developed Lt SAH. MRI showed multiple Lt frontal and parietal lobe infarcts. Repeat CT of head on 9/30 showed increased SAH which extends into the basal cisterns, as well as cytotoxic edema Lt insula and Lt frontal lobes. PMH includes: HTN, cataracts    PT Comments    Pt continues to display unsteadiness and deviations during gait that impact her safety with mobility. In support, her DGI score this date was 6/24, indicative of risk for falls. She also has difficulty with sequencing multiple step tasks and with problem solving as she required multiple guesses and extra time to find her room this date. She continues to lose her balance laterally to the R during gait and attempts to watch her feet rather than look anteriorly, despite cues to correct. Secondary to her being at high risk for falls, her current functional status varying greatly from her PLOF, and her ability to tolerate several hours of therapy the pt continues to be appropriate for CIR. Will continue to follow-up with pt with acute PT services to address her deficits to inc her independence and safety with functional mobility.   Follow Up Recommendations  CIR;Supervision for mobility/OOB     Equipment Recommendations  3in1 (PT)    Recommendations for Other Services Rehab consult     Precautions / Restrictions Precautions Precautions: Fall Precaution Comments: R inattention, R weakness, incontinence, expressive and receptive deficits Restrictions Weight Bearing Restrictions: No     Mobility  Bed Mobility Overal bed mobility: Needs Assistance Bed Mobility: Rolling;Sidelying to Sit Rolling: Min guard Sidelying to sit: Min assist       General bed mobility comments: Bed flat with pt utilizing L bed rail despite cues not to. Provided VC's and TC's to bring LEs off EOB and ascend trunk simultaneously, with difficulty understanding commands noted. Required assistance to manage R hip scooting anteriorly to EOB to obtain upright sitting position.  Transfers Overall transfer level: Needs assistance   Transfers: Sit to/from Stand Sit to Stand: Min assist         General transfer comment: Required 2 attempts to successfully come to stand, with assistance to maintain balance.  Ambulation/Gait Ambulation/Gait assistance: Min assist Gait Distance (Feet): 250 Feet Assistive device: None Gait Pattern/deviations: Step-through pattern;Decreased step length - right;Decreased dorsiflexion - right (dec heel>toe sequence R LE; inc R trunk sway) Gait velocity: reduced Gait velocity interpretation: <1.31 ft/sec, indicative of household ambulator General Gait Details: DGI performed with results of 6/24. Ambulates with excessive R trunk sway during stance, resulting in intermittent bouts of LOB (5x during gait) in which pt requires minA to recover balance. MinA to dec unsteadiness on feet. VC's to inc R step length, heel-to-toe sequence, and inc L weight shift with TC's at hips while simultaneously looking anteriorly, min-mod success.    Stairs             Wheelchair Mobility    Modified Rankin (Stroke Patients Only) Modified Rankin (Stroke Patients Only) Pre-Morbid Rankin Score: No symptoms Modified Rankin: Moderately severe disability     Balance Overall balance assessment: Needs assistance Sitting-balance support: Feet supported Sitting  balance-Leahy Scale: Fair Sitting balance - Comments: Sitting EOB with B UE support, LOB noted with lateral weight shifts.    Standing balance support: No upper extremity supported;During functional activity Standing balance-Leahy Scale: Fair Standing balance comment: Displays bouts of LOB toward R with R LE stance in gait, requiring minA to recover balance.                            Cognition Arousal/Alertness: Awake/alert Behavior During Therapy: WFL for tasks assessed/performed Overall Cognitive Status: Impaired/Different from baseline Area of Impairment: Following commands;Problem solving                       Following Commands: Follows one step commands consistently;Follows multi-step commands with increased time;Follows multi-step commands inconsistently     Problem Solving: Slow processing;Difficulty sequencing General Comments: Cued pt to find her way back to room, requiring extra time and pt quessed several directions before choosing correct path.      Exercises      General Comments        Pertinent Vitals/Pain Pain Assessment: No/denies pain Faces Pain Scale: No hurt    Home Living                      Prior Function            PT Goals (current goals can now be found in the care plan section) Acute Rehab PT Goals Patient Stated Goal: Pt unable to state  PT Goal Formulation: Patient unable to participate in goal setting Time For Goal Achievement: 07/20/20 Potential to Achieve Goals: Good Progress towards PT goals: Progressing toward goals    Frequency    Min 4X/week      PT Plan Current plan remains appropriate    Co-evaluation     PT goals addressed during session: Mobility/safety with mobility;Balance        AM-PAC PT "6 Clicks" Mobility   Outcome Measure  Help needed turning from your back to your side while in a flat bed without using bedrails?: A Little Help needed moving from lying on your back to sitting on the side of a flat bed without using bedrails?: A Little Help needed moving to and from a bed to a chair (including a  wheelchair)?: A Little Help needed standing up from a chair using your arms (e.g., wheelchair or bedside chair)?: A Little Help needed to walk in hospital room?: A Little Help needed climbing 3-5 steps with a railing? : A Little 6 Click Score: 18    End of Session Equipment Utilized During Treatment: Gait belt Activity Tolerance: Patient tolerated treatment well Patient left: in chair;with call bell/phone within reach;with chair alarm set;with family/visitor present (son in room)   PT Visit Diagnosis: Unsteadiness on feet (R26.81);Other abnormalities of gait and mobility (R26.89);Muscle weakness (generalized) (M62.81);Difficulty in walking, not elsewhere classified (R26.2);Other symptoms and signs involving the nervous system (R29.898);Hemiplegia and hemiparesis Hemiplegia - Right/Left: Right Hemiplegia - caused by: Cerebral infarction     Time: 1130-1150 PT Time Calculation (min) (ACUTE ONLY): 20 min  Charges:  $Gait Training: 8-22 mins                     Raymond Gurney, PT, DPT Acute Rehabilitation Services  Pager: [phone] Office: [phone]    Jewel Baize 07/11/2020, 1:10 PM

## 2020-07-11 NOTE — Progress Notes (Signed)
Patient ID: Sharon Gilbert, female   DOB: 02/10/1941, 79 y.o.   MRN: [id number]  Received patient to room via wheelchair with staff in stable condition.  Assisted to chair with 1 person standby assist. Patient noted to be slightly unsteady.  Skin assessment completed with Hassie Bruce, RN.  VSS. Son at bedside to complete admission history.  Patient oriented but with expressive aphasia, denies pain or current needs.  Oriented to environment with call light within reach.

## 2020-07-11 NOTE — Progress Notes (Signed)
Inpatient Rehab Admissions Coordinator:   I have a CIR bed for this Pt. Today. I have met with Pt. And her son and obtained necessary consents. Will plan to admit today. RN may call CIR nurse at [phone] for report.   Clemens Catholic, [place], [place] Admissions Coordinator  [phone] ([place]) [phone] (office)

## 2020-07-11 NOTE — Discharge Summary (Signed)
Stroke Discharge Summary  Patient ID: Sharon Gilbert   MRN: [id number]      DOB: Oct 07, 1940  Date of Admission: 07/03/2020 Date of Discharge: 07/11/2020  Attending Physician:  No att. providers found, Stroke MD Consultant(s):   Roxanne Gates) Corliss Skains, MD (Interventional Neuroradiologist), Chilton Greathouse, MD (pulmonary/intensive care), Faith Rogue, MD (Physical Medicine & Rehabilitation)  Patient's PCP:  Pearson Grippe, MD  Discharge Diagnoses:  Principal Problem:   Acute ischemic left ICA stroke (HCC) d/t LAA R ICA Active Problems:   Carotid stenosis, left, symptomatic s/p L ICA stent   Carotid stenosis, asymptomatic, right   Essential hypertension   Hyperlipidemia LDL goal <70   Dysphagia due to recent stroke   Prediabetes   Medications to be continued on Rehab Allergies as of 07/11/2020      Reactions   Lipitor [atorvastatin] Other (See Comments)   Muscle pain   Topiramate Other (See Comments)   Vomiting & Wheezing      Medication List    STOP taking these medications   aspirin EC 81 MG tablet Replaced by: aspirin 81 MG chewable tablet   fluticasone 50 MCG/ACT nasal spray Commonly known as: FLONASE   hydrOXYzine 25 MG tablet Commonly known as: ATARAX/VISTARIL   rosuvastatin 40 MG tablet Commonly known as: CRESTOR   topiramate 25 MG tablet Commonly known as: Topamax     TAKE these medications   acetaminophen 325 MG tablet Commonly known as: TYLENOL Take 2 tablets (650 mg total) by mouth every 4 (four) hours as needed for mild pain (or temp > 37.5 C (99.5 F)).   aspirin 81 MG chewable tablet Chew 1 tablet (81 mg total) by mouth daily. Start taking on: July 12, 2020 Replaces: aspirin EC 81 MG tablet   atorvastatin 80 MG tablet Commonly known as: LIPITOR Take 1 tablet (80 mg total) by mouth daily at 6 PM.   ciclopirox 8 % solution Commonly known as: PENLAC Apply topically daily.   dorzolamide-timolol 22.3-6.8 MG/ML ophthalmic solution Commonly  known as: COSOPT Place 1 drop into both eyes 2 (two) times daily. What changed: when to take this   enoxaparin 40 MG/0.4ML injection Commonly known as: LOVENOX Inject 0.4 mLs (40 mg total) into the skin daily. Start taking on: July 12, 2020   latanoprost 0.005 % ophthalmic solution Commonly known as: XALATAN Place 1 drop into both eyes at bedtime. What changed: when to take this   lisinopril-hydrochlorothiazide 20-12.5 MG tablet Commonly known as: ZESTORETIC Take 1 tablet by mouth daily.   ticagrelor 90 MG Tabs tablet Commonly known as: BRILINTA Take 1 tablet (90 mg total) by mouth 2 (two) times daily.       LABORATORY STUDIES CBC    Component Value Date/Time   WBC 9.0 07/09/2020 0224   RBC 3.86 (L) 07/09/2020 0224   HGB 11.1 (L) 07/09/2020 0224   HCT 33.1 (L) 07/09/2020 0224   PLT 316 07/09/2020 0224   MCV 85.8 07/09/2020 0224   MCH 28.8 07/09/2020 0224   MCHC 33.5 07/09/2020 0224   RDW 13.6 07/09/2020 0224   LYMPHSABS 1.1 07/05/2020 0507   MONOABS 0.4 07/05/2020 0507   EOSABS 0.0 07/05/2020 0507   BASOSABS 0.0 07/05/2020 0507   CMP    Component Value Date/Time   NA 136 07/09/2020 0224   K 3.7 07/09/2020 0224   CL 105 07/09/2020 0224   CO2 26 07/09/2020 0224   GLUCOSE 108 (H) 07/09/2020 0224   BUN 11 07/09/2020 0224  CREATININE 0.96 07/09/2020 0224   CALCIUM 9.0 07/09/2020 0224   PROT 7.5 07/03/2020 1843   ALBUMIN 3.8 07/03/2020 1843   AST 22 07/03/2020 1843   ALT 18 07/03/2020 1843   ALKPHOS 71 07/03/2020 1843   BILITOT 0.7 07/03/2020 1843   GFRNONAA 56 (L) 07/09/2020 0224   GFRAA >60 07/09/2020 0224   COAGS Lab Results  Component Value Date   INR 1.1 07/03/2020   Lipid Panel    Component Value Date/Time   CHOL 275 (H) 07/04/2020 0738   TRIG 112 07/06/2020 0547   HDL 46 07/04/2020 0738   CHOLHDL 6.0 07/04/2020 0738   VLDL 27 07/04/2020 0738   LDLCALC 202 (H) 07/04/2020 0738   HgbA1C  Lab Results  Component Value Date   HGBA1C 6.1  (H) 07/04/2020    SIGNIFICANT DIAGNOSTIC STUDIES  07/03/20 CT Head WO IV Contrast Mild generalized cerebral atrophy, no acute intracranial hemorrhage, no demarcated cortical infarct, ASPECTS 10   07/03/20 CT Angio Head and Neck W WO IV Contrast CTA Neck:  1. The left ICA becomes occluded shortly beyond its origin and remains occluded throughout the remainder of the neck. 2. High-grade near occlusive stenosis within the proximal right ICA with radiographic string sign at this site. 3. The vertebral arteries are patent within the neck bilaterally. Severe atherosclerotic narrowing at the origin of the left vertebral artery  CTA head: 1. There is reconstitution of the intracranial left ICA at the level of the cavernous segment. However, this vessel remains asymmetrically diminutive. Superimposed moderate/severe atherosclerotic stenosis within the distal cavernous/paraclinoid segment. 2. 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 3. Calcified plaque within the intracranial right ICA with up to moderate stenosis within the cavernous segment.  07/03/2020 CT CEREBRAL PERFUSION W CONTRAST 31 mL of ischemic penumbra involving the left frontal operculum without core infarct.   07/03/20 MRI Brain WO IV Contrast Multiple small acute infarcts within the left frontal and parietal lobes within the left MCA vascular territory. The largest infarct within the left parietal lobe postcentral gyrus measures 2.1 cm. The remaining infarcts are punctate or less than 5 mm.Signal abnormality within the visualized distal cervical and pre cavernous left ICA consistent with vessel occlusion demonstrated at these sites on the prior CTA. A proximal left M2 MCA branch occlusion was better appreciated on this prior study.  07/04/2020 IR PERCUTANEOUS ART THROMBECTOMY/INFUSION INTRACRANIAL INC DIAG ANGIO + IR CT Head Ltd Status post endovascular complete revascularization of occluded left middle cerebral artery M1  branch with 1 pass with the 5 mm x 33 mm Embotrap retrieval device and aspiration, and 1 pass with aspiration achieving a TICI 3 revascularization. 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   07/04/2020  CT HEAD WO CONTRAST Interval progression of infarct now vomiting the left insula and operculum not seen on the prior MRI yesterday. No associated hemorrhage. In addition, there appears to be possible hyperdensity in the left MCA branch in the sylvian fissure which could be due to new thrombus.   07/05/2020 CT Head Wo Contrast 1. Progressive subarachnoid hemorrhage reaching the basal cisterns. No hydrocephalus.  2. Interval parenchymal hemorrhage at the level of left insular infarction.  3. No shift.   07/07/2020 MR BRAIN WO CONTRAST + MR ANGIO HEAD WO CONTRAST 1. Evolving moderate-sized acute left MCA infarct with similar appearance of parenchymal hemorrhage in the left insular region and subarachnoid hemorrhage compared to the prior CT.  Minimal rightward midline shift.  2. Intracranial atherosclerosis with mild right and mild-to-moderate left ICA stenoses. The left ICA remains patent following revascularization. Limited detailed assessment of left MCA branch vessels due to subarachnoid hemorrhage in the sylvian fissure.   07/04/20 Echocardiogram Complete  1. Left ventricular ejection fraction, by estimation, is 70 to 75%. The left ventricle has hyperdynamic function. The left ventricle has no regional wall motion abnormalities. Left ventricular diastolic parameters are consistent with Grade I diastolic dysfunction (impaired relaxation).  2. Right ventricular systolic function is normal. The right ventricular size is normal.  3. The mitral valve is normal in structure. No evidence of mitral valve regurgitation. No evidence of mitral stenosis.  4. Tricuspid valve regurgitation is moderate.  5.  The aortic valve is tricuspid. Aortic valve regurgitation is not visualized. Mild to moderate aortic valve sclerosis/calcification is present, without any evidence of aortic stenosis.  6. The inferior vena cava is normal in size with greater than 50% respiratory variability, suggesting right atrial pressure of 3 mmHg.   07/05/2020 Portable Chest xray Appliances appear in satisfactory position. Shallow inspiration with atelectasis in the right base.   07/04/2020 Portable Chest x-ray Endotracheal tube tip about 4 cm superior to the carina. No acute pulmonary infiltrate.   07/04/2020 DG Abd Portable 1V Esophageal tube tip overlies the gastric fundus with side-port at the GE junction, consider further advancement for more optimal positioning.    HISTORY OF PRESENT ILLNESS Sharon Gilbert is a 79 y.o. female right handed independent (her son lives with her) got home from work and her son noticed slurred speech, mild face droop and trouble closing the zipper on her jeans so called EMS. She is on aspirin 81mg  daily. She was LKW at 1500 on 07/03/2020. tPA not given as no significant deficits. IR Thrombectomy not performed d/t Low NIHSS. Modified Rankin Scale: 0. NIHSS: 2    HOSPITAL COURSE Sharon Gilbert is a 79 y.o. female with history of cataracts and diabetes presenting with slurred speech, mild face droop. Found to have a tandem L ICA and M2 occlusion w/ transient aphasia and RUE weakness. Admitted to neuro ICU. Did not receive tPA d/t mild sx.   Stroke:   L MCA infarct due to L ICA and M2 occlusion from large vessel disease with subsequent neuro worsening s/p ICA stent and left MCA thrombectomy.  Postprocedure hemorrhagic conversion at L MCA infarct   CT head 9/28 No acute abnormality. Mild cerebral Atrophy. ASPECTS 10.   CTA head and neck L ICA occlusion beyond origin and remains occluded through neck. Proximal L M2 occlusion. High-grade near occlusive stenosis proximal R ICA w/ string  sign. Intracranial R ICA w/ moderate stenosis cavernous segment. L VA origin w/ severe atherosclerosis narrowing.   CT perfusion 29mL penumbra L frontal operculum w/o  Core  MRI 9/28 Multiple small L frontal and parietal lobe infarcts.   IR - 07/04/20 - Lt MCA M1 occlusion with TICI 3 revascularization. S/P revascularization of Lt ICA prox with stent assisted angioplasty.   CT Head 9/30 - increased SAH which now extends into the basal cisterns.  Cytotoxic edema in the left insula and lateral frontal lobe   MRI 07/07/20 - Evolving moderate-sized acute left MCA infarct with similar appearance of parenchymal hemorrhage in the left insular region and subarachnoid hemorrhage compared to the prior CT.   MRA 07/07/20 -  Intracranial atherosclerosis with mild right and mild-to-moderate left ICA stenoses. The left ICA remains patent following revascularization.  2D Echo - EF 70 - 75%. No cardiac source of emboli identified.   LDL 202   HgbA1c 6.1   P2Y12 19   VTE prophylaxis - Lovenox 40 mg sq daily   aspirin 81 mg daily prior to admission, now on aspirin 81 mg daily and Brilinta (ticagrelor) 90 mg bid. Continue on discharge.  Therapy recommendations: CIR  Disposition:  pending   Bilateral Carotid Stenosis   CTA neck - L ICA occlusion beyond origin and remains occluded through neck. High-grade near occlusive stenosis proximal R ICA w/ string sign. L VA   07/04/20 s/p Lt ICA stent.   MRA 07/07/20 - The left ICA remains patent following revascularization.   Dr. Corliss Skains on board for follow up with right ICA high grade stenosis  Hypertension  Home meds:  Lisinopril-HCTZ 20-12.5 daily  On lisinopril 20  SBP goal < 160 due to HT  Avoid low BP also due to right ICA stenosis  Long-term BP goal 130-150 given ICA stenosis  Hyperlipidemia    Home meds:  None  LDL 202, goal < 70  Add lipitor 80  Continue statin at discharge  Pre-Diabetes   Home meds:  none  HgbA1c 6.1,  goal < 7.0  SSI  CBG monitoring  Dysphagia  Secondary to stroke  NPO -> Dysphagia II diet with thin liquids  PO intake improved  On IVF @ 50 -> 25 -> off  Speech on board  Other Stroke Risk Factors  Advanced age  Overweight, Body mass index is 29.53 kg/m., recommend weight loss, diet and exercise as appropriate   Other Active Problems  Bradycardia, likely related to carotid stenting, resolved  Anemia - Hgb - 10.0->10.1->10.8->11.1  Hypokalemia - potassium - 3.2->3.5->3.4->3.4->3.7   DISCHARGE EXAM Blood pressure 139/73, pulse 74, temperature 98.6 F (37 C), temperature source Oral, resp. rate 18, height 5\' 5"  (1.651 m), weight 80.5 kg, SpO2 100 %.  General- Well nourished, well developed, in no apparent distress.  Ophthalmologic- fundi not visualized due to noncooperation.  Cardiovascular - Regular rhythm and rate.  Neuro - awake alert expressive aphasia, able to have some words but not sentences and still has intermittent intangible words. Able to follow one step simple commands but not 2-step commands.Not able to name or repeat. No gaze palsy, tracking bilaterally, but not quite cooperative on visual field testing. Right facial droop. Tongue midline. LUE 4/5, RUE proximal 4/5 but bicep tricep 4-/5, wrist extension 3/5 and finger grip 2+/5. BLEs 4/5 proximal and 5/5 distal symmetrical. FTN b/l intact. Sensation seems to have decreased on the right. Gait not tested.  Discharge Diet  Dysphagia 2 thin liquids  DISCHARGE PLAN  Disposition:  Transfer to [facility] for ongoing PT, OT and ST  aspirin 81 mg daily and Brilinta (ticagrelor) 90 mg bid for secondary stroke prevention in setting of stenting  Recommend ongoing stroke risk factor control by Primary Care Physician at time of discharge from inpatient rehabilitation.  Follow-up PCP [facility], MD in 2 weeks following discharge from rehab.  Follow-up in [facility] in 4 weeks following discharge from rehab, office to schedule an appointment.   45 minutes were spent preparing discharge.  Pearson Grippe, MD PhD Stroke Neurology 07/11/2020 7:22 PM

## 2020-07-11 NOTE — TOC Transition Note (Signed)
Transition of Care [facility]) - CM/SW Discharge Note   Patient Details  Name: Sharon Gilbert MRN: [id number] Date of Birth: 18-Oct-1940  [facility] CM/SW Contact:  Kermit Balo, RN Phone Number: 07/11/2020, 10:30 AM   Clinical Narrative:    Pt is discharging to CIR today. CM signing off.   Final next level of care: IP Rehab Facility Barriers to Discharge: No Barriers Identified   Patient Goals and CMS Choice        Discharge Placement                       Discharge Plan and Services                                     Social Determinants of Health (SDOH) Interventions     Readmission Risk Interventions No flowsheet data found.

## 2020-07-11 NOTE — Consult Note (Signed)
PMR Admission Coordinator Pre-Admission Assessment  Patient: Sharon Gilbert is an 79 y.o., female MRN: [id number] DOB: 1941/05/01 Height: _0  (165.1 cm) Weight: 80.5 kg                                                                                                                                                  Insurance Information HMO:     PPO: yes    PCP:      IPA:      80/20:      OTHER:  PRIMARY: [employer]      Policy#: [id number]      Subscriber: Pt.  CM Name: Monique      Phone#:  n/a - online at [email or web address] provider portal     Fax#: [phone], Concurrent Review due 70/35/0093 Pre-Cert#: [id number]     Employer:  Benefits:  Phone #:      Name:  Eff. Date: 10/06/2017 - 10/05/2020     Deduct: $0     Out of Pocket Max:  $5,000 ($225 met)      Life Max: n/a CIR: $295/day co-pay with a max co-pay of $1,770/admission (6 days)    SNF: $0/day co-pay for days 1-20, $184/day co-pay for days 21-100; limited to 100 days Outpatient: $35/visit co-pay; limited by medical necessityHome Health:100% coverage, 0% co-insurance; limited by medical necessity DME: DME: 80% coverage; 20% co-insurance Providers: In network   SECONDARY:  none  Financial Counselor:    Phone#:   [employer]" for patients in Inpatient Rehabilitation Facilities with attached "Privacy Act [place] Records" was provided and verbally reviewed with: Patient  Emergency Contact Information         Contact Information    Name Relation Home Work Mobile   Sharon Gilbert Spouse [place] Sister [phone]     Sharon Gilbert   [phone]     Current Medical History  Patient Admitting Diagnosis:CVA History of Present Illness: Sharon Gilbert a 79 y.o.RH-femalewith history of T2DMand glaucoma;who was admitted on 07/03/20 with slurred speech. CTA head/neck showed occlusion of L-M2 MCA branch with showed left ICA occlusion beyond origin,  near occlusive stenosis proximal R-ICA with string sign. CT perfusion without core. Follow up MRI brain done revealing multiple small infarcts in left frontal and parietal lobe. She had developed right sided weakness overnight and underwent cerebral angio with complete revascularization of L-MCA M1 occlusion and revascularization of proximal L-ICA with stent associated angioplasty. Post op CT head showed mild ot moderate SAH and patient left intubated till 9/30. Follow up CT head showed cytotoxic edema from insular cortex and frontal region and increase in [facility]. SBP goals 120-140. 2D echo showed EF 70-75% with moderate TVR and mild to moderate aortic valve sclerosis. She was started on dysphagia 2, thins as no signs of aspiration noted.  Patient with right sided weakness with right inattention and severe expressive aphasia with motor apraxia. Therapy evaluations completed and CIR recommended due to functional decline.    Complete NIHSS TOTAL: 8 Glasgow Coma Scale Score: 15  Past Medical History      Past Medical History:  Diagnosis Date  . Cataract   . Diabetes mellitus without complication (HCC)     Family History  family history is not on file.  Prior Rehab/Hospitalizations:  Has the patient had prior rehab or hospitalizations prior to admission? Yes  Has the patient had major surgery during 100 days prior to admission? Yes  Current Medications   Current Facility-Administered Medications:  .  0.9 %  sodium chloride infusion, , Intravenous, Continuous, Rosalin Hawking, MD, Last Rate: 25 mL/hr at 07/10/20 1101, New Bag at 07/10/20 1101 .  0.9 %  sodium chloride infusion, 250 mL, Intravenous, Continuous, Donnetta Simpers, MD .  acetaminophen (TYLENOL) tablet 650 mg, 650 mg, Oral, Q4H PRN, 650 mg at 07/10/20 2006 **OR** acetaminophen (TYLENOL) 160 MG/5ML solution 650 mg, 650 mg, Per Tube, Q4H PRN **OR** acetaminophen (TYLENOL) suppository 650 mg, 650 mg, Rectal, Q4H PRN, Deveshwar,  Sanjeev, MD .  aspirin chewable tablet 81 mg, 81 mg, Oral, Daily, 81 mg at 07/10/20 1027 **OR** aspirin chewable tablet 81 mg, 81 mg, Per Tube, Daily, Deveshwar, Sanjeev, MD, 81 mg at 07/05/20 0947 .  atorvastatin (LIPITOR) tablet 80 mg, 80 mg, Oral, q1800, Donnetta Simpers, MD, 80 mg at 07/09/20 1758 .  Chlorhexidine Gluconate Cloth 2 % PADS 6 each, 6 each, Topical, Daily, Donnetta Simpers, MD, 6 each at 07/09/20 0955 .  dorzolamide-timolol (COSOPT) 22.3-6.8 MG/ML ophthalmic solution 1 drop, 1 drop, Both Eyes, BID, Ollis, Brandi L, NP, 1 drop at 07/10/20 2228 .  enoxaparin (LOVENOX) injection 40 mg, 40 mg, Subcutaneous, Q24H, Rosalin Hawking, MD, 40 mg at 07/10/20 1030 .  hydrALAZINE (APRESOLINE) injection 5-10 mg, 5-10 mg, Intravenous, Q4H PRN, Rosalin Hawking, MD, 10 mg at 07/07/20 2107 .  lisinopril (ZESTRIL) tablet 20 mg, 20 mg, Oral, Daily, 20 mg at 07/10/20 1027 **AND** hydrochlorothiazide (MICROZIDE) capsule 12.5 mg, 12.5 mg, Oral, Daily, Rosalin Hawking, MD, 12.5 mg at 07/10/20 1027 .  latanoprost (XALATAN) 0.005 % ophthalmic solution 1 drop, 1 drop, Both Eyes, QHS, Ollis, Brandi L, NP, 1 drop at 07/10/20 2229 .  MEDLINE mouth rinse, 15 mL, Mouth Rinse, BID, Rosalin Hawking, MD, 15 mL at 07/09/20 0955 .  ticagrelor (BRILINTA) tablet 90 mg, 90 mg, Oral, BID, 90 mg at 07/10/20 2228 **OR** ticagrelor (BRILINTA) tablet 90 mg, 90 mg, Per Tube, BID, Deveshwar, Sanjeev, MD, 90 mg at 07/05/20 0947  Patients Current Diet:     Diet Order                  DIET DYS 2 Room service appropriate? Yes with Assist; Fluid consistency: Thin  Diet effective now                  Precautions / Restrictions Precautions Precautions: Fall Precaution Comments: R inattention, R weakness, incontinence, expressive and receptive deficits Restrictions Weight Bearing Restrictions: No   Has the patient had 2 or more falls or a fall with injury in the past year?No  Prior Activity Level Limited Community  (1-2x/wk): pt. 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  Independent  Indoor Mobility: Did the patient need assistance with walking from room to room (with or without device)? Independent  Stairs: Did the patient need assistance with internal or external stairs (with or without device)? Independent  Functional Cognition: Did the patient need help planning regular tasks such as shopping or remembering to take medications? Independent  Home Assistive Devices / Equipment Home Equipment: None  Prior Device Use: Indicate devices/aids used by the patient prior to current illness, exacerbation or injury? None of the above  Current Functional Level Cognition  Overall Cognitive Status: Impaired/Different from baseline Difficult to assess due to: Impaired communication Orientation Level: Oriented to person, Oriented to place Following Commands: Follows one step commands consistently General Comments: OT trialed yes/no reponses to questions, at this time pt with 90% accuracy during exercises for completion of ADL's. able to correctly identfiy written yes/no options as well as provide correct response. immediate verbalizations with fair accuracy i.e. identify color, item. clock draw demos decreased attention to R field/environment, with poor spatial awareness and increased difficulty noted d/t use of non-dom hand.    Extremity Assessment (includes Sensation/Coordination)  Upper Extremity Assessment: Defer to OT evaluation RUE Deficits / Details: Pt demonstrates ~70* shoulder flexion and elbow flex/extension actively.   beginning Mass grasp noted with ~10* AROM noted  RUE Sensation:  (difficult to accurately assess ) RUE Coordination: decreased fine motor, decreased gross  motor  Lower Extremity Assessment: Defer to PT evaluation RLE Deficits / Details: grossly 4/5, mild knee buckling noted during ambulation resulting in one LOB RLE Sensation:  (difficult to assess 2/2 communication deficits) LLE Deficits / Details: grossly 4+/5    ADLs  Overall ADL's : Needs assistance/impaired Eating/Feeding: Set up Grooming: Wash/dry face, Wash/dry hands, Oral care, Minimal assistance, Sitting Upper Body Bathing: Moderate assistance, Cueing for sequencing, Sitting, Cueing for compensatory techniques Lower Body Bathing: Moderate assistance, Sit to/from stand Upper Body Dressing : Moderate assistance, Cueing for compensatory techniques, Cueing for sequencing Upper Body Dressing Details (indicate cue type and reason): doff/donn front and back gown, decreased carry over noted Lower Body Dressing: Maximal assistance, Sit to/from stand Lower Body Dressing Details (indicate cue type and reason): Pt able to doff socks with close min guard assist in supported sitting, and was able to don Rt sock with min A.  She went through the motions of preparing the sock to don it on the Lt sock, however, there was nothing in her hands.  She did this repetitively with no awareness.  When cued, she was able to locate her sock, and she donnned it with mod A  Toilet Transfer: Minimal assistance, Cueing for sequencing, Cueing for safety Toileting- Clothing Manipulation and Hygiene: Moderate assistance Toileting - Clothing Manipulation Details (indicate cue type and reason): A for thoroughness, fair sequencing but noted L UE is not dominant side, Functional mobility during ADLs: Minimal assistance General ADL Comments: Hand hold assist provided during toileting and standing ADL's for steadying, no significant LOB observed, increased cues provided for safety.    Mobility  Overal bed mobility: Needs Assistance Bed Mobility: Supine to Sit Supine to sit: Min assist General bed mobility comments: Bed  flat with pt utilizing L bed rail, providing repetitive TC's and VC's to manage LEs off EOB.     Transfers  Overall transfer level: Needs assistance Equipment used: 1 person hand held assist Transfers: Sit to/from Stand Sit to Stand: Min assist Stand pivot transfers: Min assist General transfer  comment: Pt comes to stand swiftly, requiring assistance to steady self intermittently and occasionally 2 attempts to come to stand.     Ambulation / Gait / Stairs / Wheelchair Mobility  Ambulation/Gait Ambulation/Gait assistance: Herbalist (Feet): 100 Feet (x2 bouts with additional 2 bouts of 20 ft) Assistive device: Straight cane, None Gait Pattern/deviations: Step-through pattern, Decreased step length - right (R trunk sway) General Gait Details: Required 1 step commands to sequence utilization of cane with visual, verbal, and tactile cues to attempt 3-point and 2-point gait pattern, with pt moving slowly. Attempted ambulating without assistive device with pt able to ambulate faster with intermittent mod trunk sway to R, x2 minor LOB with minA for safety to regain balance. Gait velocity: reduced (TUG performed 2x with avergae of 27.49 sec no device) Gait velocity interpretation: <1.31 ft/sec, indicative of household ambulator Stairs: Yes Stairs assistance: Min assist Stair Management: Two rails, Step to pattern Number of Stairs: 6 (on ~6 inch high steps in gym) General stair comments: Ascending and descending with step-to gait pattern and use of B hand rails, displaying unsteadiness and requiring extra time to complete esp when descending. HOH assistance to maintain R hand grip on rail, providing visual and verbal cues to ascend leading with L and descend leading with R. Cued pt to bring B UEs with her as she negotiated stairs. Provided faded feedback with these steps, with good carryover and learning noted but pt still requiring assistance for safety due to unsteadiness.     Posture / Balance Dynamic Sitting Balance Sitting balance - Comments: No LOB with sitting EOB. Balance Overall balance assessment: Needs assistance Sitting-balance support: Feet supported Sitting balance-Leahy Scale: Fair Sitting balance - Comments: No LOB with sitting EOB. Standing balance support: Single extremity supported, No upper extremity supported (progressed from [facility] in L UE to no device) Standing balance-Leahy Scale: Poor Standing balance comment: Displays bouts of LOB toward R with R LE stance in gait, requiring minA to recover balance.    Special needs/care consideration Special service needs Pt. with severe aphasia     Previous Home Environment (from acute therapy documentation) Living Arrangements: Alone  Lives With: Alone Available Help at Discharge:  (uncertain) Type of Home: [place]: One level Home Access: Stairs to enter Entrance Stairs-Rails: Left, Right Entrance Stairs-Number of Steps: 5 Additional Comments: pt indicates that her son lives with her   Discharge Living Setting Plans for Discharge Living Setting: House Type of Home at Discharge: House Discharge Home Layout: One level Discharge Home Access: Stairs to enter Entrance Stairs-Rails: Can reach both Entrance Stairs-Number of Steps:  (2) Discharge Bathroom Shower/Tub: Tub/shower unit, Walk-in shower Discharge Bathroom Toilet: Handicapped height Discharge Bathroom Accessibility: Yes How Accessible: Accessible via walker  Social/Family/Support Systems Patient Roles: Other (Comment) Contact Information: [phone] Anticipated Caregiver: Elba Barman (sister) Anticipated Caregiver's Contact Information: [phone] Pt. Has two sons who are making decisions for her. Her son, Mikle Bosworth. is local and his contact is [phone]. Her other son, Francee Piccolo, lives in [place] and his number is [phone]. Ability/Limitations of Caregiver: Can provide supervision, is elderly Caregiver Availability:  24/7 Discharge Plan Discussed with Primary Caregiver: Yes Is Caregiver In Agreement with Plan?: Yes Does Caregiver/Family have Issues with Lodging/Transportation while Pt is in Rehab?: No   Goals Patient/Family Goal for Rehab: PT/OT/SLP Supervision Expected length of stay: 18-21 days Pt/Family Agrees to Admission and willing to participate: Yes Program Orientation Provided & Reviewed with Pt/Caregiver Including Roles  & Responsibilities: Yes  Decrease burden of Care through IP rehab admission: Specialzed equipment needs, Diet advancement, Decrease number of caregivers, Bowel and bladder program and Patient/family education   Possible need for SNF placement upon discharge: not anticipated   Patient Condition: This patient's condition remains as documented in the consult dated 07/09/2020, in which the Rehabilitation Physician determined and documented that the patient's condition is appropriate for intensive rehabilitative care in an inpatient rehabilitation facility. Will admit to inpatient rehab today.  Preadmission Screen Completed By:  Genella Mech, CCC-SLP, 07/11/2020 9:17 AM ______________________________________________________________________   Discussed status with Dr. Ranell Patrick on 07/11/2020 at 9:30 AM and received approval for admission today.

## 2020-07-12 ENCOUNTER — Inpatient Hospital Stay (HOSPITAL_COMMUNITY): Payer: Medicare HMO | Admitting: Occupational Therapy

## 2020-07-12 ENCOUNTER — Inpatient Hospital Stay (HOSPITAL_COMMUNITY): Payer: Medicare HMO | Admitting: Speech Pathology

## 2020-07-12 ENCOUNTER — Inpatient Hospital Stay (HOSPITAL_COMMUNITY): Payer: Medicare HMO

## 2020-07-12 DIAGNOSIS — I63232 Cerebral infarction due to unspecified occlusion or stenosis of left carotid arteries: Secondary | ICD-10-CM | POA: Diagnosis not present

## 2020-07-12 DIAGNOSIS — D62 Acute posthemorrhagic anemia: Secondary | ICD-10-CM | POA: Diagnosis not present

## 2020-07-12 DIAGNOSIS — I69391 Dysphagia following cerebral infarction: Secondary | ICD-10-CM | POA: Diagnosis not present

## 2020-07-12 DIAGNOSIS — E876 Hypokalemia: Secondary | ICD-10-CM | POA: Diagnosis not present

## 2020-07-12 DIAGNOSIS — I10 Essential (primary) hypertension: Secondary | ICD-10-CM

## 2020-07-12 DIAGNOSIS — I6521 Occlusion and stenosis of right carotid artery: Secondary | ICD-10-CM

## 2020-07-12 DIAGNOSIS — R7401 Elevation of levels of liver transaminase levels: Secondary | ICD-10-CM

## 2020-07-12 DIAGNOSIS — E118 Type 2 diabetes mellitus with unspecified complications: Secondary | ICD-10-CM

## 2020-07-12 LAB — GLUCOSE, CAPILLARY
Glucose-Capillary: 99 mg/dL (ref 70–99)
Glucose-Capillary: 92 mg/dL (ref 70–99)
Glucose-Capillary: 111 mg/dL — ABNORMAL HIGH (ref 70–99)
Glucose-Capillary: 103 mg/dL — ABNORMAL HIGH (ref 70–99)

## 2020-07-12 LAB — COMPREHENSIVE METABOLIC PANEL
ALT: 45 U/L — ABNORMAL HIGH (ref 0–44)
AST: 36 U/L (ref 15–41)
Albumin: 3.3 g/dL — ABNORMAL LOW (ref 3.5–5.0)
Alkaline Phosphatase: 53 U/L (ref 38–126)
Anion gap: 12 (ref 5–15)
BUN: 9 mg/dL (ref 8–23)
CO2: 21 mmol/L — ABNORMAL LOW (ref 22–32)
Calcium: 9.2 mg/dL (ref 8.9–10.3)
Chloride: 104 mmol/L (ref 98–111)
Creatinine, Ser: 0.89 mg/dL (ref 0.44–1.00)
GFR calc non Af Amer: >60 mL/min (ref >60)
Glucose, Bld: 107 mg/dL — ABNORMAL HIGH (ref 70–99)
Potassium: 3.5 mmol/L (ref 3.5–5.1)
Sodium: 137 mmol/L (ref 135–145)
Total Bilirubin: 0.9 mg/dL (ref 0.3–1.2)
Total Protein: 6.8 g/dL (ref 6.5–8.1)

## 2020-07-12 LAB — CBC WITH DIFFERENTIAL/PLATELET
Abs Immature Granulocytes: 0.02 10*3/uL (ref 0.00–0.07)
Basophils Absolute: 0 10*3/uL (ref 0.0–0.1)
Basophils Relative: 1 %
Eosinophils Absolute: 0.2 10*3/uL (ref 0.0–0.5)
Eosinophils Relative: 3 %
HCT: 33.2 % — ABNORMAL LOW (ref 36.0–46.0)
Hemoglobin: 10.9 g/dL — ABNORMAL LOW (ref 12.0–15.0)
Immature Granulocytes: 0 %
Lymphocytes Relative: 28 %
Lymphs Abs: 1.7 10*3/uL (ref 0.7–4.0)
MCH: 28.4 pg (ref 26.0–34.0)
MCHC: 32.8 g/dL (ref 30.0–36.0)
MCV: 86.5 fL (ref 80.0–100.0)
Monocytes Absolute: 0.9 10*3/uL (ref 0.1–1.0)
Monocytes Relative: 14 %
Neutro Abs: 3.5 10*3/uL (ref 1.7–7.7)
Neutrophils Relative %: 54 %
Platelets: 375 10*3/uL (ref 150–400)
RBC: 3.84 MIL/uL — ABNORMAL LOW (ref 3.87–5.11)
RDW: 13.6 % (ref 11.5–15.5)
WBC: 6.3 10*3/uL (ref 4.0–10.5)
nRBC: 0 % (ref 0.0–0.2)

## 2020-07-12 NOTE — IPOC Note (Signed)
Individualized overall Plan of Care (IPOC) Patient Details Name: Sharon Gilbert MRN: [id number] DOB: 10-31-1940  Admitting Diagnosis: Acute ischemic left ICA stroke [facility])  Hospital Problems: Principal Problem:   Acute ischemic left ICA stroke (HCC) d/t LAA R ICA Active Problems:   Acute ischemic left MCA stroke (HCC)   Dysphagia, post-stroke   Acute blood loss anemia   Hypokalemia   Controlled type 2 diabetes mellitus with complication, without long-term current use of insulin (HCC)   Stenosis of right internal carotid artery   Transaminitis     Functional Problem List: Nursing Behavior, Bladder, Bowel, Motor, Perception, Safety  PT Balance, Behavior, Endurance, Perception, Safety, Sensory  OT Balance, Perception, Safety, Cognition, Endurance, Vision, Motor  SLP Cognition  TR         Basic ADL's: OT Eating, Grooming, Bathing, Dressing, Toileting     Advanced  ADL's: OT Simple Meal Preparation, Laundry, Light Housekeeping     Transfers: PT Bed Mobility, Bed to Chair, Furniture, Customer service manager, Research scientist (life sciences): PT Ambulation, Stairs     Additional Impairments: OT Fuctional Use of Upper Extremity  SLP Swallowing, Social Cognition   Social Interaction, Problem Solving, Attention  TR      Anticipated Outcomes Item Anticipated Outcome  Self Feeding mod I  Swallowing  mod I   Basic self-care  mod I  Toileting  mod I   Bathroom Transfers mod I  Bowel/Bladder  maintain regular pattern of emptying  Transfers  mod I with LRAD  Locomotion  Supervision with LRAD  Communication  max A verbal, minA multimodal communication and receptive language  Cognition  supervision to mod I for basic functional problem solving  Pain  no pain  Safety/Judgment  remain free of falls, skin breakdown and infection   Therapy Plan: PT Intensity: Minimum of 1-2 x/day ,45 to 90 minutes PT Frequency: 5 out of 7 days PT Duration Estimated Length of Stay: 2 weeks OT  Intensity: Minimum of 1-2 x/day, 45 to 90 minutes OT Frequency: 5 out of 7 days OT Duration/Estimated Length of Stay: 2 weeks SLP Intensity: Minumum of 1-2 x/day, 30 to 90 minutes SLP Frequency: 3 to 5 out of 7 days SLP Duration/Estimated Length of Stay: 2 weeks    Team Interventions: Nursing Interventions Patient/Family Education, Bladder Management, Bowel Management, Disease Management/Prevention, Medication Management, Cognitive Remediation/Compensation, Dysphagia/Aspiration Precaution Training, Discharge Planning, Psychosocial Support  PT interventions Ambulation/gait training, Discharge planning, Functional mobility training, Psychosocial support, Therapeutic Activities, Visual/perceptual remediation/compensation, Wheelchair propulsion/positioning, Therapeutic Exercise, Skin care/wound management, Neuromuscular re-education, Disease management/prevention, Warden/ranger, Cognitive remediation/compensation, DME/adaptive equipment instruction, Pain management, Splinting/orthotics, UE/LE Strength taining/ROM, Stair training, UE/LE Coordination activities, Patient/family education, Functional electrical stimulation, Community reintegration  OT Interventions Warden/ranger, Discharge planning, Functional electrical stimulation, Self Care/advanced ADL retraining, Therapeutic Activities, UE/LE Coordination activities, Cognitive remediation/compensation, Disease mangement/prevention, Functional mobility training, Patient/family education, Therapeutic Exercise, Visual/perceptual remediation/compensation, DME/adaptive equipment instruction, Neuromuscular re-education, Psychosocial support, UE/LE Strength taining/ROM, Splinting/orthotics  SLP Interventions Dysphagia/aspiration precaution training, Cognitive remediation/compensation, Environmental controls, Functional tasks, Multimodal communication approach, Patient/family education, Speech/Language facilitation  TR Interventions     SW/CM Interventions Discharge Planning, Psychosocial Support, Patient/Family Education   Barriers to Discharge MD  Medical stability, Behavior, and aphasia  Nursing      PT Inaccessible home environment, Home environment access/layout, Decreased caregiver support, Incontinence expressive & receptive aphasia. Will likely require 24/7 S/A.  OT      SLP Decreased caregiver support    SW Decreased caregiver support  Not confirmed dc plan-checking with sister   Team Discharge Planning: Destination: PT-Home ,OT- Home , SLP-  Projected Follow-up: PT-24 hour supervision/assistance, Outpatient PT, OT-  Other (comment) (HH versus OP OT), SLP-Home Health SLP, 24 hour supervision/assistance Projected Equipment Needs: PT-To be determined, OT- To be determined, SLP-None recommended by SLP Equipment Details: PT-Will need to confirm equipment available at home when family present, OT-  Patient/family involved in discharge planning: PT- Patient,  OT-Patient, SLP-Patient  MD ELOS: 12-15 days. Medical Rehab Prognosis:  Good Assessment: 79 year old RH-female with history of T2DM and glaucoma who was admitted on 07/03/20 with slurred speech. CTA head/neck showed occlusive stenosis of proximal R-ICA with string sign, L-ICA occlusion beyound origin and throughout the neck. CT perfusion with 31 ml penumbra and and without core infarct. Follow up MRI brain done revealing multiple small infarcts in left frontal and parietal lobes. She developed right sided weakness overnight with onset of of aphasia and underwent cerebral angio with complete revascularization of proximal L-ICA with stent angioplasty and complete revascularization of L-MCA M1 occlusion.  Follow up CT head showed mild to moderate SAH due to hemorrhagic conversion of L-MCA infarct. She was kept intubated till 09/30. Follow up CT head showed cytotoxic edema from insular cortex to frontal region and increase in [facility] --neurology recommended SBP goals  120-140 range. 2D echo done revealing EF 70-75% with moderate TVR and mild to moderate AV sclerosis.  She was started on dysphagia 2, thin liquids due to prolonged mastication and mild right sided pocketing but intake has been poor requiring IVF for hydration. She continued to have RUE weakness and follow up MRI/MRA 10/02 showed evolving moderate acute L-MCA infarct with no change in IPH/SAH and minimal rightward midline shift and prior occluded left M2 branch of ICA now patent.  Dr. 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 Dr.Deveshwar to follow up on R-ICA stenosis.long term BP goal 130-150 range given ICA stenosis.  Patient with resultant right facial droop with expressive deficits, difficulty with multi-step commands, question of right visual field deficits per OT, dysphagia and RUE weakness affecting ADLs and mobility. Will set goals for mod I with PT/OT and mod I with cognition/swallowing, however Max A with verbal communication with SLP.  Due to the current state of emergency, patients may not be receiving their 3-hours of Medicare-mandated therapy.  See Team Conference Notes for weekly updates to the plan of care

## 2020-07-12 NOTE — Progress Notes (Signed)
Speech Language Pathology Daily Session Note  Patient Details  Name: Sharon Gilbert MRN: [id number] Date of Birth: Jan 26, 1941  Today's Date: 07/12/2020 SLP Individual Time: 1500-1530 SLP Individual Time Calculation (min): 30 min  Short Term Goals: Week 1: SLP Short Term Goal 1 (Week 1): Patient will identify object/noun pictures in field of 6 with 85% accuracy. SLP Short Term Goal 2 (Week 1): Patient will point to verb/action pictures in field of 3-4 with 80% accuracy. SLP Short Term Goal 3 (Week 1): Patient will imitate at word level with maxA cues for 70% accuracy. SLP Short Term Goal 4 (Week 1): Patient will use task-specific communication board during structured tasks with minA SLP Short Term Goal 5 (Week 1): Patient will tolerate trials of Dys 3 solids with trace to minimal oral residuals and minA to clear.  Skilled Therapeutic Interventions:   Patient was seen for skilled ST session focusing on receptive and expressive language. Patient able to imitate to produce CV (consonant-vowel) portions of words but highly inconsistent. She pointed to object pictures in field of 6 with 80% accuracy following 2 trials and being acclimated to pictures used. She pointed to printed, unrelated words in field of 6 with initially 70% accuracy and then improved to 80% accuracy for second trial. Patient continues to benefit from skilled SLP intervention to maximize speech-language and swallow function prior to discharge.  Pain Pain Assessment Pain Scale: 0-10 Pain Score: 0-No pain Faces Pain Scale: No hurt  Therapy/Group: Individual Therapy  Angela Nevin, MA, CCC-SLP Speech Therapy

## 2020-07-12 NOTE — Progress Notes (Signed)
Inpatient Rehabilitation Center Individual Statement of Services  Patient Name:  Sharon Gilbert  Date:  07/12/2020  Welcome to the Inpatient Rehabilitation [facility].  Our goal is to provide you with an individualized program based on your diagnosis and situation, designed to meet your specific needs.  With this comprehensive rehabilitation program, you will be expected to participate in at least 3 hours of rehabilitation therapies Monday-Friday, with modified therapy programming on the weekends.  Your rehabilitation program will include the following services:  Physical Therapy (PT), Occupational Therapy (OT), Speech Therapy (ST), 24 hour per day rehabilitation nursing, Care Coordinator, Rehabilitation Medicine, Nutrition Services and Pharmacy Services  Weekly team conferences will be held on Wednesday to discuss your progress.  Your Inpatient Rehabilitation Care Coordinator will talk with you frequently to get your input and to update you on team discussions.  Team conferences with you and your family in attendance may also be held.  Expected length of stay: 2 weeks  Overall anticipated outcome: supervision-mod/i level  Depending on your progress and recovery, your program may change. Your Inpatient Rehabilitation Care Coordinator will coordinate services and will keep you informed of any changes. Your Inpatient Rehabilitation Care Coordinator's name and contact numbers are listed  below.  The following services may also be recommended but are not provided by the Inpatient Rehabilitation Center:   Driving Evaluations  Home Health Rehabiltiation Services  Outpatient Rehabilitation Services  Vocational Rehabilitation   Arrangements will be made to provide these services after discharge if needed.  Arrangements include referral to agencies that provide these services.  Your insurance has been verified to be:  [employer] Your primary doctor is:  Pearson Grippe  Pertinent information will be  shared with your doctor and your insurance company.  Inpatient Rehabilitation Care Coordinator:  Dossie Der, [place] [phone] or Luna Glasgow  Information discussed with and copy given to patient by: Lucy Chris, 07/12/2020, 2:07 PM

## 2020-07-12 NOTE — Progress Notes (Signed)
Inpatient Rehabilitation  Patient information reviewed and entered into eRehab system by Desarea Ohagan M. Katalyn Matin, M.A., CCC/SLP, PPS Coordinator.  Information including medical coding, functional ability and quality indicators will be reviewed and updated through discharge.    

## 2020-07-12 NOTE — Progress Notes (Signed)
Patient Details  Name: Sharon Gilbert MRN: [id number] Date of Birth: 05/07/1941  Today's Date: 07/12/2020  Hospital Problems: Principal Problem:   Acute ischemic left ICA stroke (HCC) d/t LAA R ICA Active Problems:   Acute ischemic left MCA stroke (HCC)   Dysphagia, post-stroke   Acute blood loss anemia   Hypokalemia   Controlled type 2 diabetes mellitus with complication, without long-term current use of insulin (HCC)   Stenosis of right internal carotid artery   Transaminitis  Past Medical History:  Past Medical History:  Diagnosis Date  . Cataract   . Diabetes mellitus without complication [facility])    Past Surgical History:  Past Surgical History:  Procedure Laterality Date  . IR CT HEAD LTD  07/04/2020  . IR INTRAVSC STENT CERV CAROTID W/O EMB-PROT MOD SED INC ANGIO  07/04/2020  . IR PERCUTANEOUS ART THROMBECTOMY/INFUSION INTRACRANIAL INC DIAG ANGIO  07/04/2020  . RADIOLOGY WITH ANESTHESIA N/A 07/04/2020   Procedure: IR WITH ANESTHESIA;  Surgeon: Radiologist, Medication, MD;  Location: [facility] OR;  Service: Radiology;  Laterality: N/A;   Social History:  reports that she has never smoked. She has never used smokeless tobacco. She reports that she does not drink alcohol and does not use drugs.  Family / Support Systems Marital Status: Widow/Widower Patient Roles: Parent, Other (Comment) (employee) Children: Assunta Curtis [phone] local  Roger-son [phone]-cell [place] Other Supports: Doris-sister [phone] Robin-sister Anticipated Caregiver: Doris sister but will need to confirm TL-son was not sure and could not confirm. This worker will contact her herself Ability/Limitations of Caregiver: Sister is elderly and can only provide supervision level Caregiver Availability: 24/7 Family Dynamics: Close knit family son does not live with pt she lived alone. Pt is close wth her sister's and son who is local, but was very independent and took care of herself and worked prior to  admission  Social History Preferred language: English Religion:  Cultural Background: No issues Education: CNA certified Read: Yes Write: Yes Employment Status: Employed Name of Employer: Aeronautical engineer Return to Work Plans: Plans to retire now but did enjoy it and looked forward to seeing participants Marine scientist Issues: No issues Guardian/Conservator: None-according to MD pt is capable of making her own decisions while here. Will include son per pt's preference.   Abuse/Neglect Abuse/Neglect Assessment Can Be Completed: Yes Physical Abuse: Denies Verbal Abuse: Denies Sexual Abuse: Denies Exploitation of patient/patient's resources: Denies Self-Neglect: Denies  Emotional Status Pt's affect, behavior and adjustment status: Pt is motivated to do well and hopes to get her speech back. This is frustrating to her due to she knows what to say but it doesn't come out right. She has always been independent and taken care of others not the other way around. Recent Psychosocial Issues: healthy prior to admission Psychiatric History: No history-deferred depression screen due to aphasia will continue to reassess to see if appropriate for neuro-psych to see while here Substance Abuse History: NO issues  Patient / Family Perceptions, Expectations & Goals Pt/Family understanding of illness & functional limitations: Pt and son seem to havre a basic understanding of her stroke and deficits. She works with folks like her and has seen this before. Son hopes she improves and can be independent again. Premorbid pt/family roles/activities: Mom, employee, sister, church member Anticipated changes in roles/activities/participation: resume Pt/family expectations/goals: Pt states: ' I will"  Son states: " I hope she can be home alone again, but will check with my aunt."  [employer]: None Premorbid Home  Care/DME Agencies: None Transportation available at  discharge: Self will need to rely upon family now  Discharge Planning Living Arrangements: Alone Support Systems: Children, Other relatives, Church/faith community Type of Residence: Private residence Insurance Resources: Media planner (specify) Administrator Medicare) Financial Resources: Employment, Restaurant manager, fast food Screen Referred: No Living Expenses: Own Money Management: Patient Does the patient have any problems obtaining your medications?: No Home Management: Self Patient/Family Preliminary Plans: Return home with her sister staying and/or son checking on. Discharge plan not confirmed and will need to prior to team conference. Informed son will need someone there with due to her speech issues for safety even if she is mobile. Care Coordinator Barriers to Discharge: Decreased caregiver support Care Coordinator Barriers to Discharge Comments: Not confirmed dc plan-checking with sister Care Coordinator Anticipated Follow Up Needs: HH/OP  Clinical Impression Pleasant female who has worked hard all of her life and was still working when this happened. Her sister's and local son are supportive. Will confirm discharge plan with sister, since son was non-committal with plan. He is somewhat unrealistic regarding pt being able to return home independently. Will need at least supervision due to speech issues for safety. Will await evaluations and work on best plan for pt.  Lucy Chris 07/12/2020, 1:28 PM

## 2020-07-12 NOTE — Evaluation (Signed)
Occupational Therapy Assessment and Plan  Patient Details  Name: Sharon Gilbert MRN: [id number] Date of Birth: 1941-03-06  OT Diagnosis: cognitive deficits, disturbance of vision and hemiplegia affecting dominant side Rehab Potential: Rehab Potential (ACUTE ONLY): Excellent ELOS: 2 weeks   Today's Date: 07/12/2020 OT Individual Time: 1302-1400 OT Individual Time Calculation (min): 74 min     Hospital Problem: Principal Problem:   Acute ischemic left ICA stroke (HCC) d/t LAA R ICA Active Problems:   Acute ischemic left MCA stroke (HCC)   Dysphagia, post-stroke   Acute blood loss anemia   Hypokalemia   Controlled type 2 diabetes mellitus with complication, without long-term current use of insulin (HCC)   Stenosis of right internal carotid artery   Transaminitis   Past Medical History:  Past Medical History:  Diagnosis Date   Cataract    Diabetes mellitus without complication ([place])    Past Surgical History:  Past Surgical History:  Procedure Laterality Date   IR CT HEAD LTD  07/04/2020   IR INTRAVSC STENT CERV CAROTID W/O EMB-PROT MOD SED INC ANGIO  07/04/2020   IR PERCUTANEOUS ART THROMBECTOMY/INFUSION INTRACRANIAL INC DIAG ANGIO  07/04/2020   RADIOLOGY WITH ANESTHESIA N/A 07/04/2020   Procedure: IR WITH ANESTHESIA;  Surgeon: Radiologist, Medication, MD;  Location: [facility];  Service: Radiology;  Laterality: N/A;    Assessment & Plan Clinical Impression:  Sharon Gilbert is an 79 year old RH-female with history of T2DM and glaucoma who was admitted on 07/03/20 with slurred speech. CTA head/neck showed occlusive stenosis of proximal R-ICA with string sign, L-ICA occlusion beyound origin and throughout the neck. CT perfusion with 31 ml penumbra and and without core infarct. Follow up MRI brain done revealing multiple small infarcts in left frontal and parietal lobes. She developed right sided weakness overnight with onset of of aphasia and underwent cerebral angio with complete  revascularization of proximal L-ICA with stent angioplasty and complete revascularization of L-MCA M1 occlusion.  Follow up CT head showed mild to moderate SAH due to hemorrhagic conversion of L-MCA infarct. She was kept intubated till 09/30. Follow up CT head showed cytotoxic edema from insular cortex to frontal region and increase in [facility] --neurology recommended SBP goals 120-140 range.   2D echo done revealing EF 70-75% with moderate TVR and mild to moderate AV sclerosis.  She was started on dysphagia 2, thin liquids due to prolonged mastication and mild right sided pocketing but intake has been poor requiring IVF for hydration. She continued to have RUE weakness and follow up MRI/MRA 10/02 showed evolving moderate acute L-MCA infarct with no change in IPH/SAH and minimal rightward midline shift and prior occluded left M2 branch of ICA now patent.  Dr. Erlinda Hong felt that stroke was due to atherosclerotic embolism and she continues on ASA/Brilinta as well as high dose statin. Dr.Deveshwar to follow up on R-ICA stenosis.long term BP goal 130-150 range given ICA stenosis.  Patient with resultant right facial droop with expressive deficits, difficulty with multi-step commands, question of right visual field deficits per OT,   dysphagia and RUE weakness affecting ADLs and mobility.  Patient transferred to CIR on 07/11/2020 .    Patient currently requires min with basic self-care skills secondary to muscle weakness, decreased cardiorespiratoy endurance, unbalanced muscle activation, decreased coordination and decreased motor planning, decreased visual perceptual skills and field cut, right side neglect and decreased motor planning, decreased attention, decreased awareness, decreased problem solving and decreased safety awareness and decreased sitting balance, decreased standing balance,  hemiplegia and decreased balance strategies.  Prior to hospitalization, patient could complete ADLs and IADLs with independent  .  Patient will benefit from skilled intervention to increase independence with basic self-care skills prior to discharge home independently.  Anticipate patient will require intermittent supervision and follow up to be determined..  OT - End of Session Activity Tolerance: Tolerates 30+ min activity with multiple rests Endurance Deficit: Yes Endurance Deficit Description: Benefits from short, brief rest breaks OT Assessment Rehab Potential (ACUTE ONLY): Excellent OT Patient demonstrates impairments in the following area(s): Balance;Perception;Safety;Cognition;Endurance;Vision;Motor OT Basic ADL's Functional Problem(s): Eating;Grooming;Bathing;Dressing;Toileting OT Advanced ADL's Functional Problem(s): Simple Meal Preparation;Laundry;Light Housekeeping OT Transfers Functional Problem(s): Toilet;Tub/Shower OT Additional Impairment(s): Fuctional Use of Upper Extremity OT Plan OT Intensity: Minimum of 1-2 x/day, 45 to 90 minutes OT Frequency: 5 out of 7 days OT Treatment/Interventions: Balance/vestibular training;Discharge planning;Functional electrical stimulation;Self Care/advanced ADL retraining;Therapeutic Activities;UE/LE Coordination activities;Cognitive remediation/compensation;Disease mangement/prevention;Functional mobility training;Patient/family education;Therapeutic Exercise;Visual/perceptual remediation/compensation;DME/adaptive equipment instruction;Neuromuscular re-education;Psychosocial support;UE/LE Strength taining/ROM;Splinting/orthotics OT Self Feeding Anticipated Outcome(s): mod I OT Basic Self-Care Anticipated Outcome(s): mod I OT Toileting Anticipated Outcome(s): mod I OT Bathroom Transfers Anticipated Outcome(s): mod I OT Recommendation Recommendations for Other Services: Other (comment) Patient destination: Home Follow Up Recommendations: Other (comment) (HH versus OP OT) Equipment Recommended: To be determined   OT Evaluation Precautions/Restrictions   Precautions Precautions: Fall Precaution Comments: R inattention, R HB weakness (RUE>RLE), expressive > receptive aphasia Restrictions Weight Bearing Restrictions: No General Chart Reviewed: Yes Pain Pain Assessment Pain Scale: Faces Faces Pain Scale: No hurt Home Living/Prior Functioning Home Living Family/patient expects to be discharged to:: Private residence Living Arrangements: Alone Available Help at Discharge: Other (Comment) (to be determined) Type of Home: House Home Access: Stairs to enter [employer] of Steps: 5 Entrance Stairs-Rails: Left, Right Home Layout: One level Additional Comments: pt indicates that her son lives with her   Lives With: Alone Prior Function Level of Independence: Independent with transfers, Independent with gait, Independent with basic ADLs, Independent with homemaking with ambulation  Able to Take Stairs?: Yes Comments: Per chart review, 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 Vision Baseline Vision/History: Wears glasses Wears Glasses: Reading only Vision Assessment?: Vision impaired- to be further tested in functional context Eye Alignment: Within Functional Limits Ocular Range of Motion: Within Functional Limits Alignment/Gaze Preference: Within Defined Limits Tracking/Visual Pursuits: Able to track stimulus in all quads without difficulty Additional Comments: visual field cut versus inattention on right Perception  Perception: Impaired Comments: possible right inattention- to be further assessed Praxis Praxis: Intact Cognition Overall Cognitive Status: Impaired/Different from baseline Arousal/Alertness: Awake/alert Orientation Level: Person;Place;Situation Person: Oriented Place: Oriented Situation: Oriented Year: 2021 Month: October Day of Week: Incorrect Memory: Impaired Memory Impairment: Other (comment) (difficult to assess due to significant expressive and mild receptive aphasia) Immediate Memory  Recall:  (difficult to assess due to significant expressive and mild receptive aphasia) Memory Recall Sock: Not able to recall (difficult to assess due to significant expressive and mild receptive aphasia) Memory Recall Blue: Not able to recall (difficult to assess due to significant expressive and mild receptive aphasia) Memory Recall Bed: Not able to recall (difficult to assess due to significant expressive and mild receptive aphasia) Attention: Focused;Sustained Focused Attention: Impaired Focused Attention Impairment: Functional basic Sustained Attention: Impaired Sustained Attention Impairment: Functional basic Awareness: Impaired Awareness Impairment: Emergent impairment Problem Solving: Impaired Problem Solving Impairment: Functional basic Executive Function: Sequencing;Initiating Sequencing: Appears intact Initiating: Appears intact Safety/Judgment: Impaired Sensation Sensation Light Touch: Impaired by gross assessment Hot/Cold:  Appears Intact Proprioception: Impaired by gross assessment Stereognosis: Not tested Coordination Gross Motor Movements are Fluid and Coordinated: No Fine Motor Movements are Fluid and Coordinated: No Finger Nose Finger Test: LUE WFL. RUE limited by digit extension Motor  Motor Motor: Hemiplegia;Motor apraxia Motor - Skilled Clinical Observations: Possible R inattention - requires reminders for RUE placement during functional tasks and often leaves it hanging.  Trunk/Postural Assessment  Cervical Assessment Cervical Assessment: Within Functional Limits Thoracic Assessment Thoracic Assessment: Within Functional Limits Lumbar Assessment Lumbar Assessment: Within Functional Limits Postural Control Postural Control: Within Functional Limits  Balance Balance Balance Assessed: Yes Static Sitting Balance Static Sitting - Level of Assistance: 5: Stand by assistance Dynamic Sitting Balance Dynamic Sitting - Level of Assistance: 4: Min  assist Sitting balance - Comments: Sitting EOB with B UE support, LOB noted with lateral weight shifts. Static Standing Balance Static Standing - Level of Assistance: 4: Min assist Extremity/Trunk Assessment RUE Assessment RUE Assessment: Exceptions to [facility] General Strength Comments: 2-/5 wrist and digit flexion, trace digit/wrist extension, bicep 4-/5, tricep 4-/5, shldr 3+/5 LUE Assessment LUE Assessment: Within Functional Limits  Care Tool Care Tool Self Care Eating   Eating Assist Level: Set up assist    Oral Care    Oral Care Assist Level: Supervision/Verbal cueing    Bathing   Body parts bathed by patient: Right arm;Chest;Abdomen;Front perineal area;Buttocks;Right upper leg;Left upper leg;Face     Assist Level: Minimal Assistance - Patient > 75%    Upper Body Dressing(including orthotics)   What is the patient wearing?: Pull over shirt   Assist Level: Moderate Assistance - Patient 50 - 74%    Lower Body Dressing (excluding footwear)   What is the patient wearing?: Underwear/pull up;Pants Assist for lower body dressing: Moderate Assistance - Patient 50 - 74%    Putting on/Taking off footwear   What is the patient wearing?: Non-skid slipper socks Assist for footwear: Minimal Assistance - Patient > 75%       Care Tool Toileting Toileting activity   Assist for toileting: Minimal Assistance - Patient > 75%     Care Tool Bed Mobility Roll left and right activity        Sit to lying activity        Lying to sitting edge of bed activity         Care Tool Transfers Sit to stand transfer        Chair/bed transfer         Toilet transfer   Assist Level: Minimal Assistance - Patient > 75%     Care Tool Cognition Expression of Ideas and Wants Expression of Ideas and Wants: Rarely/Never expressess or very difficult - rarely/never expresses self or speech is very difficult to understand   Understanding Verbal and Non-Verbal Content Understanding Verbal and  Non-Verbal Content: Sometimes understands - understands only basic conversations or simple, direct phrases. Frequently requires cues to understand   Memory/Recall Ability *first 3 days only Memory/Recall Ability *first 3 days only: None of the above were recalled    Refer to Care Plan for Long Term Goals  SHORT TERM GOAL WEEK 1 OT Short Term Goal 1 (Week 1): Pt will complete UB dressing with supervision OT Short Term Goal 2 (Week 1): Pt will complete bathing with CGA. OT Short Term Goal 3 (Week 1): Pt will complete LB dressing with supervision. OT Short Term Goal 4 (Week 1): Pt will complete toilet transfer with supervision.  Recommendations for other services: None  Skilled Therapeutic Intervention ADL ADL Grooming: Supervision/safety Where Assessed-Grooming: Sitting at sink Upper Body Bathing: Minimal assistance Where Assessed-Upper Body Bathing: Sitting at sink Lower Body Bathing: Minimal assistance Where Assessed-Lower Body Bathing: Sitting at sink;Standing at sink Upper Body Dressing: Moderate assistance Where Assessed-Upper Body Dressing: Sitting at sink Lower Body Dressing: Moderate assistance Where Assessed-Lower Body Dressing: Sitting at sink;Standing at sink Toileting: Minimal assistance Where Assessed-Toileting: Glass blower/designer: Therapist, music Method: Counselling psychologist: Grab bars Mobility  Transfers Sit to Stand: Contact Guard/Touching assist Stand to Sit: Contact Guard/Touching assist  Skilled Intervention: Pt sitting up in recliner, no appearance of pain, agreeable to OT session.  Initial evaluation completed, educated pt on OT scope of practice, and collaborated with pt on POC.  Pt completed self care ad functional mobility per above levels of assist. Pt had continent episode of urine also at toilet. Pt returned to recliner at end of session, call bell in reach, seat belt alarm on.  Discharge Criteria: Patient will be  discharged from OT if patient refuses treatment 3 consecutive times without medical reason, if treatment goals not met, if there is a change in medical status, if patient makes no progress towards goals or if patient is discharged from hospital.  The above assessment, treatment plan, treatment alternatives and goals were discussed and mutually agreed upon: by patient  Ezekiel Slocumb 07/12/2020, 4:59 PM

## 2020-07-12 NOTE — Progress Notes (Signed)
[place] PHYSICAL MEDICINE & REHABILITATION PROGRESS NOTE  Subjective/Complaints: Patient seen laying in bed this morning.  She indicates she slept well overnight.  No reported issues overnight.  ROS: Limited due to aphasia  Objective: Vital Signs: Blood pressure (!) 134/53, pulse 71, temperature 98.7 F (37.1 C), temperature source Axillary, resp. rate 18, height 5\' 5"  (1.651 m), weight 76.3 kg, SpO2 99 %. No results found. Recent Labs    07/12/20 0626  WBC 6.3  HGB 10.9*  HCT 33.2*  PLT 375   Recent Labs    07/12/20 0626  NA 137  K 3.5  CL 104  CO2 21*  GLUCOSE 107*  BUN 9  CREATININE 0.89  CALCIUM 9.2    Intake/Output Summary (Last 24 hours) at 07/12/2020 1314 Last data filed at 07/12/2020 0833 Gross per 24 hour  Intake 360 ml  Output --  Net 360 ml        Physical Exam: BP (!) 134/53 (BP Location: Left Arm)   Pulse 71   Temp 98.7 F (37.1 C) (Axillary)   Resp 18   Ht 5\' 5"  (1.651 m)   Wt 76.3 kg   SpO2 99%   BMI 27.99 kg/m  Constitutional: No distress . Vital signs reviewed. HENT: Normocephalic.  Atraumatic. Eyes: EOMI. No discharge. Cardiovascular: No JVD.  RRR. Respiratory: Normal effort.  No stridor.  Bilateral clear to auscultation. GI: Non-distended.  BS +. Skin: Warm and dry.  Intact. Psych: Normal mood.  Normal behavior. Musc: No edema in extremities.  No tenderness in extremities. Neuro: Alert Expressive>> receptive aphasia Right facial weakness.  Motor: RUE: Grossly 4 -/5 proximal distal, limited by apraxia RLE: 4+/5 proximal to distal  Assessment/Plan: 1. Functional deficits secondary to acute ischemic left ICA stroke which require 3+ hours per day of interdisciplinary therapy in a comprehensive inpatient rehab setting.  Physiatrist is providing close team supervision and 24 hour management of active medical problems listed below.  Physiatrist and rehab team continue to assess barriers to discharge/monitor patient progress  toward functional and medical goals   Care Tool:  Bathing              Bathing assist       Upper Body Dressing/Undressing Upper body dressing   What is the patient wearing?: Hospital gown only    Upper body assist Assist Level: Moderate Assistance - Patient 50 - 74%    Lower Body Dressing/Undressing Lower body dressing      What is the patient wearing?: Underwear/pull up     Lower body assist Assist for lower body dressing: Moderate Assistance - Patient 50 - 74%     Toileting Toileting    Toileting assist Assist for toileting: Moderate Assistance - Patient 50 - 74%     Transfers Chair/bed transfer  Transfers assist     Chair/bed transfer assist level: Minimal Assistance - Patient > 75%     Locomotion Ambulation   Ambulation assist      Assist level: Minimal Assistance - Patient > 75% Assistive device: No Device Max distance: 50   Walk 10 feet activity   Assist     Assist level: Minimal Assistance - Patient > 75% Assistive device: No Device   Walk 50 feet activity   Assist    Assist level: Minimal Assistance - Patient > 75% Assistive device: No Device    Walk 150 feet activity   Assist Walk 150 feet activity did not occur: Safety/medical concerns (fatigue)  Walk 10 feet on uneven surface  activity   Assist Walk 10 feet on uneven surfaces activity did not occur: Safety/medical concerns         Wheelchair     Assist Will patient use wheelchair at discharge?: No             Wheelchair 50 feet with 2 turns activity    Assist            Wheelchair 150 feet activity     Assist          Medical Problem List and Plan: 1.  Impaired mobility and ADLs secondary to acute ischemic left ICA stroke  Begin CIR evaluations 2.  L-ICA stent/Antithrombotics: -DVT/anticoagulation:  Pharmaceutical: Lovenox 40mg  q24H.              -antiplatelet therapy: ON ASA/Brilinta 3. Pain Management: N/A 4.  Mood: LCSW to follow for evaluation and support.              -antipsychotic agents: N/A 5. Neuropsych: This patient is not fully capable of making decisions on her own behalf. 6. Skin/Wound Care: Routine pressure relief measures.  7. Fluids/Electrolytes/Nutrition: Monitor I/Os.  8. HTN: Monitor BP, high graded R-ICA stenosis. Per neurology SBP goals 130-150 range.   Continue Lisinopril/HCTZ daily.   Monitor with increased mobility 9. T2DM- diet controlled?: Hgb A1C-6.1. Will monitor BS ac/hs and use SSI for elevated BS as indicated.   Monitor with increased mobility 10. Dyslipidemia: On Zocor 80 mg.  11. Intermittent Hypokalemia:   Potassium 3.5 on 10/7  Continue to monitor 12. ABLA:   Hemoglobin 10.9 on 10/7  Continue to monitor 13. Glaucoma: Stable on Xalatan and Cosopt.   14. Overweight (BMI 29.53): provide dietary counseling 15.  Post stroke dysphagia  D2 thins, advance diet as tolerated 16.  Mild transaminitis  ALT elevated on 10/7, continue to monitor given statin  LOS: 1 days A FACE TO FACE EVALUATION WAS PERFORMED  Legaci Tarman 12/7 07/12/2020, 1:14 PM

## 2020-07-12 NOTE — Progress Notes (Deleted)
Sutures removed from left hip per order. Patient tolerated ; Surgical site clean dry intact.

## 2020-07-12 NOTE — Evaluation (Signed)
Speech Language Pathology Assessment and Plan  Patient Details  Name: Sharon Gilbert MRN: [id number] Date of Birth: November 15, 1940  SLP Diagnosis: Aphasia;Dysphagia;Speech and Language deficits;Cognitive Impairments  Rehab Potential: Good ELOS: 2 weeks    Today's Date: 07/12/2020 SLP Individual Time: 0900-1000 SLP Individual Time Calculation (min): 60 min   Hospital Problem: Principal Problem:   Acute ischemic left ICA stroke (HCC) d/t LAA R ICA Active Problems:   Acute ischemic left MCA stroke (HCC)   Dysphagia, post-stroke   Acute blood loss anemia   Hypokalemia   Controlled type 2 diabetes mellitus with complication, without long-term current use of insulin (HCC)   Stenosis of right internal carotid artery   Transaminitis  Past Medical History:  Past Medical History:  Diagnosis Date   Cataract    Diabetes mellitus without complication ([place])    Past Surgical History:  Past Surgical History:  Procedure Laterality Date   IR CT HEAD LTD  07/04/2020   IR INTRAVSC STENT CERV CAROTID W/O EMB-PROT MOD SED INC ANGIO  07/04/2020   IR PERCUTANEOUS ART THROMBECTOMY/INFUSION INTRACRANIAL INC DIAG ANGIO  07/04/2020   RADIOLOGY WITH ANESTHESIA N/A 07/04/2020   Procedure: IR WITH ANESTHESIA;  Surgeon: Radiologist, Medication, MD;  Location: [facility];  Service: Radiology;  Laterality: N/A;    Assessment / Plan / Recommendation Clinical Impression    Skilled Therapeutic Interventions          Sharon Gilbert is an 79 year old RH-female with history of T2DM and glaucoma who was admitted on 07/03/20 with slurred speech. CTA head/neck showed occlusive stenosis of proximal R-ICA with string sign, L-ICA occlusion beyound origin and throughout the neck. CT perfusion with 31 ml penumbra and and without core infarct. Follow up MRI brain done revealing multiple small infarcts in left frontal and parietal lobes. She developed right sided weakness overnight with onset of of aphasia and underwent  cerebral angio with complete revascularization of proximal L-ICA with stent angioplasty and complete revascularization of L-MCA M1 occlusion.  Follow up CT head showed mild to moderate SAH due to hemorrhagic conversion of L-MCA infarct. She was kept intubated till 09/30. Follow up CT head showed cytotoxic edema from insular cortex to frontal region and increase in [facility] --neurology recommended SBP goals 120-140 range.   2D echo done revealing EF 70-75% with moderate TVR and mild to moderate AV sclerosis.  She was started on dysphagia 2, thin liquids due to prolonged mastication and mild right sided pocketing but intake has been poor requiring IVF for hydration. She continued to have RUE weakness and follow up MRI/MRA 10/02 showed evolving moderate acute L-MCA infarct with no change in IPH/SAH and minimal rightward midline shift and prior occluded left M2 branch of ICA now patent.  Dr. Erlinda Gilbert felt that stroke was due to atherosclerotic embolism and she continues on ASA/Brilinta as well as high dose statin. Dr.Deveshwar to follow up on R-ICA stenosis.long term BP goal 130-150 range given ICA stenosis.  Patient with resultant right facial droop with expressive deficits, difficulty with multi-step commands, question of right visual field deficits per OT,   dysphagia and RUE weakness affecting ADLs and mobility. CIR recommended due to functional decline.   Patient presents with a severe expressive aphasia and a mild-mod receptive aphasia and mild cognitive impairments. She was able to name two different objects but not consistently and did produce phrases such as "that's right", "I know that" but overall was not able to express even basic thoughts/information verbally. She exhibits significant  impairments in repetition, imitation, automatic speech production. She pointed to objects when named in field of 3 with 75% accuracy and pictures in field of 3 with 70% accuracy. She gestured and demonstrated understanding of object  use (comb, spoon, etc.) She demonstrated some awareness to the fact that she was making errors, but did not appear to understand the severity when trying to respond to open-ended questions, etc. As far as dysphagia, patient has decreased mastication and oral manipulation of regular solids and requires cues to clear oral pocketing. She tolerates Dys 1 and 2 textures and thin liquids without overt s/s of difficulty or aspiration/penetration s/s. Patient will benefit from skilled SLP intervention to maximize her expressive language, receptive language and swallow function with expectation of functional communication being multimodal at time of discharge from CIR and for further SLP intervention beyond CIR stay.   SLP Assessment  Patient will need skilled Speech Lanaguage Pathology Services during CIR admission    Recommendations  Liquid Administration via: Cup;Straw Medication Administration: Whole meds with puree Compensations: Minimize environmental distractions;Slow rate;Small sips/bites Postural Changes and/or Swallow Maneuvers: Seated upright 90 degrees Oral Care Recommendations: Oral care BID Follow up Recommendations: Home Health SLP;24 hour supervision/assistance Equipment Recommended: None recommended by SLP    SLP Frequency 3 to 5 out of 7 days   SLP Duration  SLP Intensity  SLP Treatment/Interventions 2 weeks  Minumum of 1-2 x/day, 30 to 90 minutes  Dysphagia/aspiration precaution training;Cognitive remediation/compensation;Environmental controls;Functional tasks;Multimodal communication approach;Patient/family education;Speech/Language facilitation    Pain Pain Assessment Pain Scale: Faces Pain Score: 0-No pain Faces Pain Scale: No hurt  Prior Functioning Cognitive/Linguistic Baseline: Within functional limits Type of Home: House  Lives With: Alone Available Help at Discharge: Other (Comment)  SLP Evaluation Cognition Overall Cognitive Status: Impaired/Different from  baseline Arousal/Alertness: Awake/alert Orientation Level: Oriented to person;Disoriented to place Attention: Sustained Focused Attention: Impaired Focused Attention Impairment: Functional basic Sustained Attention: Impaired Sustained Attention Impairment: Verbal basic;Functional basic Memory: Impaired Memory Impairment: Other (comment) (difficult to assess secondary to severe expressive language disorder) Immediate Memory Recall:  (difficult to assess due to significant expressive and mild receptive aphasia) Memory Recall Sock: Not able to recall (difficult to assess due to significant expressive and mild receptive aphasia) Memory Recall Blue: Not able to recall (difficult to assess due to significant expressive and mild receptive aphasia) Memory Recall Bed: Not able to recall (difficult to assess due to significant expressive and mild receptive aphasia) Awareness: Impaired Awareness Impairment: Emergent impairment Problem Solving: Impaired Problem Solving Impairment: Functional basic Executive Function: Self Monitoring;Initiating Sequencing: Impaired Sequencing Impairment: Verbal basic;Functional basic Initiating: Impaired Initiating Impairment: Verbal basic Self Monitoring: Impaired Self Monitoring Impairment: Verbal basic Safety/Judgment: Impaired  Comprehension Auditory Comprehension Overall Auditory Comprehension: Impaired One Step Basic Commands: 75-100% accurate Two Step Basic Commands: 75-100% accurate Multistep Basic Commands: 50-74% accurate Visual Recognition/Discrimination Discrimination: Not tested Expression Expression Primary Mode of Expression: Verbal Verbal Expression Overall Verbal Expression: Impaired Initiation: No impairment Level of Generative/Spontaneous Verbalization: Word Repetition: Impaired Level of Impairment: Word level Naming: Impairment Responsive: 0-25% accurate Confrontation: Impaired Convergent: Not tested Divergent: Not tested Verbal  Errors: Phonemic paraphasias;Semantic paraphasias;Aware of errors;Not aware of errors (shows some awareness in structured tasks only) Pragmatics: No impairment Effective Techniques: Semantic cues Non-Verbal Means of Communication: Not applicable Written Expression Dominant Hand: Right Written Expression: Not tested Oral Motor Oral Motor/Sensory Function Overall Oral Motor/Sensory Function: Moderate impairment Facial ROM: Reduced right;Suspected CN VII (facial) dysfunction Facial Symmetry: Abnormal symmetry right;Suspected CN VII (facial) dysfunction Facial  Strength: Reduced right;Suspected CN VII (facial) dysfunction Lingual Symmetry: Within Functional Limits Lingual Strength: Reduced Velum: Within Functional Limits Mandible: Within Functional Limits Motor Speech Overall Motor Speech: Other (comment) Phonation: Normal Resonance: Within functional limits Articulation: Impaired Level of Impairment: Word Intelligibility: Intelligibility reduced (difficult to assess as patient is severely aphasic and unable to consistently repeat words, very few real words produced) Motor Planning: Not tested  Care Tool Care Tool Cognition Expression of Ideas and Wants Expression of Ideas and Wants: Rarely/Never expressess or very difficult - rarely/never expresses self or speech is very difficult to understand   Understanding Verbal and Non-Verbal Content Understanding Verbal and Non-Verbal Content: Sometimes understands - understands only basic conversations or simple, direct phrases. Frequently requires cues to understand   Memory/Recall Ability *first 3 days only Memory/Recall Ability *first 3 days only: None of the above were recalled     PMSV Assessment  PMSV Trial Intelligibility: Intelligibility reduced (difficult to assess as patient is severely aphasic and unable to consistently repeat words, very few real words produced)  Bedside Swallowing Assessment General Date of Onset:  07/03/20 Previous Swallow Assessment: BSE on acute Diet Prior to this Study: Dysphagia 2 (chopped);Thin liquids Temperature Spikes Noted: No Respiratory Status: Room air History of Recent Intubation: Yes Length of Intubations (days): 2 days Date extubated: 07/05/20 Behavior/Cognition: Alert;Cooperative;Pleasant mood Oral Cavity - Dentition: Adequate natural dentition Self-Feeding Abilities: Able to feed self Vision: Functional for self-feeding Patient Positioning: Upright in bed Baseline Vocal Quality: Normal Volitional Cough: Cognitively unable to elicit Volitional Swallow: Unable to elicit  Oral Care Assessment   Ice Chips Ice chips: Not tested Thin Liquid Thin Liquid: Within functional limits Presentation: Straw Nectar Thick   Honey Thick   Puree Puree: Within functional limits Presentation: Spoon Solid Solid: Impaired Oral Phase Impairments: Impaired mastication;Reduced lingual movement/coordination Oral Phase Functional Implications: Oral residue;Prolonged oral transit BSE Assessment Risk for Aspiration Impact on safety and function: Mild aspiration risk Other Related Risk Factors: Deconditioning  Short Term Goals: Week 1: SLP Short Term Goal 1 (Week 1): Patient will identify object/noun pictures in field of 6 with 85% accuracy. SLP Short Term Goal 2 (Week 1): Patient will point to verb/action pictures in field of 3-4 with 80% accuracy. SLP Short Term Goal 3 (Week 1): Patient will imitate at word level with maxA cues for 70% accuracy. SLP Short Term Goal 4 (Week 1): Patient will use task-specific communication board during structured tasks with minA SLP Short Term Goal 5 (Week 1): Patient will tolerate trials of Dys 3 solids with trace to minimal oral residuals and minA to clear.  Refer to Care Plan for Long Term Goals  Recommendations for other services: None   Discharge Criteria: Patient will be discharged from SLP if patient refuses treatment 3 consecutive  times without medical reason, if treatment goals not met, if there is a change in medical status, if patient makes no progress towards goals or if patient is discharged from hospital.  The above assessment, treatment plan, treatment alternatives and goals were discussed and mutually agreed upon: by patient   Sonia Baller, MA, CCC-SLP Speech Therapy

## 2020-07-12 NOTE — Evaluation (Signed)
Physical Therapy Assessment and Plan  Patient Details  Name: Sharon Gilbert MRN: [id number] Date of Birth: [birth year]  PT Diagnosis: Abnormality of gait, Cognitive deficits, Difficulty walking, Hemiparesis dominant, Impaired cognition, Impaired sensation and Muscle weakness Rehab Potential: Good ELOS: 2 weeks   Today's Date: 07/12/2020 PT Individual Time: 1100-1158 PT Individual Time Calculation (min): 58 min    Hospital Problem: Principal Problem:   Acute ischemic left ICA stroke (HCC) d/t LAA R ICA Active Problems:   Acute ischemic left MCA stroke [facility])   Past Medical History:  Past Medical History:  Diagnosis Date  . Cataract   . Diabetes mellitus without complication [facility])    Past Surgical History:  Past Surgical History:  Procedure Laterality Date  . IR CT HEAD LTD  07/04/2020  . IR INTRAVSC STENT CERV CAROTID W/O EMB-PROT MOD SED INC ANGIO  07/04/2020  . IR PERCUTANEOUS ART THROMBECTOMY/INFUSION INTRACRANIAL INC DIAG ANGIO  07/04/2020  . RADIOLOGY WITH ANESTHESIA N/A 07/04/2020   Procedure: IR WITH ANESTHESIA;  Surgeon: Radiologist, Medication, MD;  Location: [place];  Service: Radiology;  Laterality: N/A;    Assessment & Plan Clinical Impression: Patient is a 79 year old RH-female with history of T2DM and glaucoma who was admitted on 07/03/20 with slurred speech. CTA head/neck showed occlusive stenosis of proximal R-ICA with string sign, L-ICA occlusion beyound origin and throughout the neck. CT perfusion with 31 ml penumbra and and without core infarct. Follow up MRI brain done revealing multiple small infarcts in left frontal and parietal lobes. She developed right sided weakness overnight with onset of of aphasia and underwent cerebral angio with complete revascularization of proximal L-ICA with stent angioplasty and complete revascularization of L-MCA M1 occlusion.  Follow up CT head showed mild to moderate SAH due to hemorrhagic conversion of L-MCA infarct. She was kept  intubated till 09/30. Follow up CT head showed cytotoxic edema from insular cortex to frontal region and increase in [facility] --neurology recommended SBP goals 120-140 range.   2D echo done revealing EF 70-75% with moderate TVR and mild to moderate AV sclerosis.  She was started on dysphagia 2, thin liquids due to prolonged mastication and mild right sided pocketing but intake has been poor requiring IVF for hydration. She continued to have RUE weakness and follow up MRI/MRA 10/02 showed evolving moderate acute L-MCA infarct with no change in IPH/SAH and minimal rightward midline shift and prior occluded left M2 branch of ICA now patent.  Dr. Erlinda Hong felt that stroke was due to atherosclerotic embolism and she continues on ASA/Brilinta as well as high dose statin. Dr.Deveshwar to follow up on R-ICA stenosis.long term BP goal 130-150 range given ICA stenosis.  Patient with resultant right facial droop with expressive deficits, difficulty with multi-step commands, question of right visual field deficits per OT,   dysphagia and RUE weakness affecting ADLs and mobility. CIR recommended due to functional decline.   Patient currently requires min with mobility secondary to muscle weakness, impaired timing and sequencing, unbalanced muscle activation and decreased motor planning, decreased attention to right and decreased attention, decreased awareness, decreased problem solving and decreased safety awareness.  Prior to hospitalization, patient was independent  with mobility and lived with Alone in a House home.  Home access is 5Stairs to enter.  Patient will benefit from skilled PT intervention to maximize safe functional mobility, minimize fall risk and decrease caregiver burden for planned discharge home with 24 hour supervision.  Anticipate patient will benefit from follow up OP at discharge.  PT - End of Session Activity Tolerance: Tolerates 30+ min activity with multiple rests Endurance Deficit: Yes Endurance  Deficit Description: Benefits from short, brief rest breaks PT Assessment Rehab Potential (ACUTE/IP ONLY): Good PT Barriers to Discharge: [place] home environment;Home environment access/layout;Decreased caregiver support;Incontinence PT Barriers to Discharge Comments: expressive & receptive aphasia. Will likely require 24/7 S/A. PT Patient demonstrates impairments in the following area(s): Balance;Behavior;Endurance;Perception;Safety;Sensory PT Transfers Functional Problem(s): Bed Mobility;Bed to Chair;Furniture;Car PT Locomotion Functional Problem(s): Ambulation;Stairs PT Plan PT Intensity: Minimum of 1-2 x/day ,45 to 90 minutes PT Frequency: 5 out of 7 days PT Duration Estimated Length of Stay: 2 weeks PT Treatment/Interventions: Ambulation/gait training;Discharge planning;Functional mobility training;Psychosocial support;Therapeutic Activities;Visual/perceptual remediation/compensation;Wheelchair propulsion/positioning;Therapeutic Exercise;Skin care/wound management;Neuromuscular re-education;Disease management/prevention;Balance/vestibular training;Cognitive remediation/compensation;DME/adaptive equipment instruction;Pain management;Splinting/orthotics;UE/LE Strength taining/ROM;Stair training;UE/LE Coordination activities;Patient/family education;Functional electrical stimulation;Community reintegration PT Transfers Anticipated Outcome(s): mod I with LRAD PT Locomotion Anticipated Outcome(s): Supervision with LRAD PT Recommendation Follow Up Recommendations: 24 hour supervision/assistance;Outpatient PT Patient destination: Home Equipment Recommended: To be determined Equipment Details: Will need to confirm equipment available at home when family present   PT Evaluation Precautions/Restrictions Precautions Precautions: Fall Precaution Comments: R inattention, R HB weakness (RUE>RLE), expressive > receptive aphasia Restrictions Weight Bearing Restrictions: No General Chart  Reviewed: Yes Additional Pertinent History: T2DM and glaucoma Family/Caregiver Present: No    Home Living/Prior Functioning Home Living Available Help at Discharge:  (aphasia limiting. no family at bedside to confirm) Type of Home: House Home Access: Stairs to enter [employer] of Steps: 5 Entrance Stairs-Rails: Left;Right Home Layout: One level Additional Comments: pt indicates that her son lives with her   Lives With: Alone Prior Function Level of Independence: Independent with transfers;Independent with gait (Per chart review. 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 Vision/Perception  Vision - Assessment Eye Alignment: Within Functional Limits Ocular Range of Motion: Within Functional Limits Alignment/Gaze Preference: Within Defined Limits Perception Perception: Impaired Comments: Possible R inattention - to be further assessed Praxis Praxis: Intact  Cognition Overall Cognitive Status: Impaired/Different from baseline Arousal/Alertness: Awake/alert Orientation Level: Oriented to place;Oriented to person;Oriented to situation (aphasia limiting) Attention: Sustained;Focused Focused Attention: Impaired Focused Attention Impairment: Functional basic Sustained Attention: Impaired Sustained Attention Impairment: Functional basic Awareness: Impaired Problem Solving: Impaired Problem Solving Impairment: Functional basic Safety/Judgment: Impaired Sensation Sensation Light Touch: Impaired by gross assessment Hot/Cold: Not tested Proprioception: Not tested Stereognosis: Not tested Coordination Gross Motor Movements are Fluid and Coordinated: No Fine Motor Movements are Fluid and Coordinated: No Coordination and Movement Description: Delayed and inconsistent. No overt dysmetria or BLE dyscoordination on functional exam Finger Nose Finger Test: LUE WFL. RUE limited by digit  extension Heel Shin Test: [facility] Motor  Motor Motor: Hemiplegia;Motor apraxia Motor - Skilled Clinical Observations: Possible R inattention - requires reminders for RUE placement during functional tasks and often leaves it hanging outside of w/c with transport   Trunk/Postural Assessment  Cervical Assessment Cervical Assessment: Exceptions to [facility] (forward head) Thoracic Assessment Thoracic Assessment: Exceptions to [facility] (mild kyphosis) Lumbar Assessment Lumbar Assessment: Within Functional Limits Postural Control Postural Control: Deficits on evaluation (delayed protective responses with RUE)  Balance Balance Balance Assessed: Yes Static Sitting Balance Static Sitting - Balance Support: Feet unsupported;No upper extremity supported Static Sitting - Level of Assistance: 5: Stand by assistance Dynamic Sitting Balance Dynamic Sitting - Balance Support: During functional activity;Feet supported Dynamic Sitting - Level of Assistance: 4: Min assist Static Standing Balance  Static Standing - Balance Support: No upper extremity supported Static Standing - Level of Assistance: 4: Min assist Extremity Assessment  RUE Assessment RUE Assessment: Exceptions to [facility] General Strength Comments: trace digit flexion, absent digit/wrist extension, bicep 4-/5, tricep 4-/5, shldr 3+/5 (limited by expressive & receptive aphasia) LUE Assessment LUE Assessment: Within Functional Limits General Strength Comments: Grossly 4+/5 RLE Assessment RLE Assessment: Exceptions to [facility] General Strength Comments: Grossly 4/5 LLE Assessment LLE Assessment: Within Functional Limits General Strength Comments: Grossly 5/5  Care Tool Care Tool Bed Mobility Roll left and right activity   Roll left and right assist level: Minimal Assistance - Patient > 75%    Sit to lying activity   Sit to lying assist level: Minimal Assistance - Patient > 75%    Lying to sitting edge of bed activity   Lying to sitting edge of bed  assist level: Minimal Assistance - Patient > 75%     Care Tool Transfers Sit to stand transfer   Sit to stand assist level: Minimal Assistance - Patient > 75%    Chair/bed transfer   Chair/bed transfer assist level: Minimal Assistance - Patient > 75%     Physiological scientist transfer assist level: Minimal Assistance - Patient > 75%      Care Tool Locomotion Ambulation   Assist level: Minimal Assistance - Patient > 75% Assistive device: No Device Max distance: 50  Walk 10 feet activity   Assist level: Minimal Assistance - Patient > 75% Assistive device: No Device   Walk 50 feet with 2 turns activity   Assist level: Minimal Assistance - Patient > 75% Assistive device: No Device  Walk 150 feet activity Walk 150 feet activity did not occur: Safety/medical concerns (fatigue)      Walk 10 feet on uneven surfaces activity Walk 10 feet on uneven surfaces activity did not occur: Safety/medical concerns      Stairs   Assist level: Minimal Assistance - Patient > 75% Stairs assistive device: 1 hand rail Max number of stairs: 8  Walk up/down 1 step activity   Walk up/down 1 step (curb) assist level: Minimal Assistance - Patient > 75% Walk up/down 1 step or curb assistive device: 1 hand rail    Walk up/down 4 steps activity Walk up/down 4 steps assist level: Minimal Assistance - Patient > 75% Walk up/down 4 steps assistive device: 1 hand rail  Walk up/down 12 steps activity Walk up/down 12 steps activity did not occur: Safety/medical concerns (fatigue after 8 tseps)      Pick up small objects from floor   Pick up small object from the floor assist level: Minimal Assistance - Patient > 75%    Wheelchair Will patient use wheelchair at discharge?: No          Wheel 50 feet with 2 turns activity      Wheel 150 feet activity        Refer to Care Plan for Long Term Goals  SHORT TERM GOAL WEEK 1 PT Short Term Goal 1 (Week 1): Pt will perform bed  mobility with CGA and no bed features PT Short Term Goal 2 (Week 1): Pt will perform bed<>chair transfers with LRAD and CGA PT Short Term Goal 3 (Week 1): Pt will ambulate at least 112f with CGA and LRAD PT Short Term Goal 4 (Week 1): Pt will navigate up/down at least x4 steps with CGA and LRAD  Recommendations for  other services: None   Skilled Therapeutic Intervention Mobility Bed Mobility Bed Mobility: Rolling Right;Rolling Left;Supine to Sit Rolling Right: Supervision/verbal cueing Rolling Left: Minimal Assistance - Patient > 75% Supine to Sit: Minimal Assistance - Patient > 75% Transfers Transfers: Sit to Stand;Stand Pivot Transfers;Stand to Sit Sit to Stand: Minimal Assistance - Patient > 75% Stand to Sit: Minimal Assistance - Patient > 75% Stand Pivot Transfers: Minimal Assistance - Patient > 75% Stand Pivot Transfer Details: Verbal cues for gait pattern;Verbal cues for precautions/safety;Verbal cues for technique;Visual cues/gestures for sequencing;Visual cues/gestures for precautions/safety Transfer (Assistive device): None Locomotion  Gait Ambulation: Yes Gait Assistance: Minimal Assistance - Patient > 75% Gait Distance (Feet): 50 Feet Assistive device: None Gait Assistance Details: Verbal cues for precautions/safety;Verbal cues for gait pattern;Verbal cues for technique;Verbal cues for sequencing Gait Gait: Yes Gait Pattern: Impaired Gait Pattern: Decreased step length - right;Decreased step length - left;Decreased hip/knee flexion - right;Decreased weight shift to right;Narrow base of support Gait velocity: significantly decreased Stairs / Additional Locomotion Stairs: Yes Stairs Assistance: Minimal Assistance - Patient > 75% Stair Management Technique: One rail Left Number of Stairs: 8 Height of Stairs: 6 Wheelchair Mobility Wheelchair Mobility: No  Skilled intervention: Pt received supine in bed, agreeable to PT session, denies pain however expressive and  receptive aphasia limiting. UTA accurate PLOF and social factors due to aphasia, spoke with CSW afterwards to confirm with family. Supine<>sit with minA for trunk control. Donned scrub pants and shirt with modA while seated EOB. Stand<>Pivot transfer with minA and no AD from EOB to w/c. Provided her with updated cushion for w/c support. Performed car transfer with minA and no AD, she performed via lateral stepping technique. She then ambulated ~25f with 2 turns with minA and no AD, demo's significantly reduced gait speed with narrow BOS. Navigated up/down x8 steps with minA and U HR, demo's reciprocal pattern without knee buckling but required assist for truncal support. Concern for possible R inattention as she frequently will leave RUE hanging off of w/c and would not correct without cueing. Returned to her room in w/c, stand<>pivot with minA from w/c to recliner. Ended session with BLE elevated, needs in reach, belt alarm on.  Instructed pt in results of PT evaluation as detailed above, PT POC, rehab potential, rehab goals, and discharge recommendations. Additionally discussed CIR's policies regarding fall safety and use of chair alarm and/or quick release belt. Pt verbalized understanding and in agreement. Will update pt's family members as they become available.   Discharge Criteria: Patient will be discharged from PT if patient refuses treatment 3 consecutive times without medical reason, if treatment goals not met, if there is a change in medical status, if patient makes no progress towards goals or if patient is discharged from hospital.  The above assessment, treatment plan, treatment alternatives and goals were discussed and mutually agreed upon: by patient  CSouth HillPT 07/12/2020, 12:41 PM

## 2020-07-13 ENCOUNTER — Inpatient Hospital Stay (HOSPITAL_COMMUNITY): Payer: Medicare HMO | Admitting: Speech Pathology

## 2020-07-13 ENCOUNTER — Inpatient Hospital Stay (HOSPITAL_COMMUNITY): Payer: Medicare HMO | Admitting: Occupational Therapy

## 2020-07-13 ENCOUNTER — Inpatient Hospital Stay (HOSPITAL_COMMUNITY): Payer: Medicare HMO

## 2020-07-13 DIAGNOSIS — Z8679 Personal history of other diseases of the circulatory system: Secondary | ICD-10-CM

## 2020-07-13 DIAGNOSIS — I63512 Cerebral infarction due to unspecified occlusion or stenosis of left middle cerebral artery: Secondary | ICD-10-CM

## 2020-07-13 DIAGNOSIS — I952 Hypotension due to drugs: Secondary | ICD-10-CM

## 2020-07-13 LAB — GLUCOSE, CAPILLARY
Glucose-Capillary: 116 mg/dL — ABNORMAL HIGH (ref 70–99)
Glucose-Capillary: 102 mg/dL — ABNORMAL HIGH (ref 70–99)
Glucose-Capillary: 98 mg/dL (ref 70–99)
Glucose-Capillary: 131 mg/dL — ABNORMAL HIGH (ref 70–99)

## 2020-07-13 NOTE — Progress Notes (Signed)
Speech Language Pathology Daily Session Note  Patient Details  Name: Sharon Gilbert MRN: [id number] Date of Birth: 08/25/1941  Today's Date: 07/13/2020 SLP Individual Time: 0800-0900 SLP Individual Time Calculation (min): 60 min  Short Term Goals: Week 1: SLP Short Term Goal 1 (Week 1): Patient will identify object/noun pictures in field of 6 with 85% accuracy. SLP Short Term Goal 2 (Week 1): Patient will point to verb/action pictures in field of 3-4 with 80% accuracy. SLP Short Term Goal 3 (Week 1): Patient will imitate at word level with maxA cues for 70% accuracy. SLP Short Term Goal 4 (Week 1): Patient will use task-specific communication board during structured tasks with minA SLP Short Term Goal 5 (Week 1): Patient will tolerate trials of Dys 3 solids with trace to minimal oral residuals and minA to clear.  Skilled Therapeutic Interventions:   Patient seen to address swallow and speech-language goals. She was finishing breakfast when SLP arrived and did exhibit a trace-mild amount of anterior spillage on right side of mouth, but only trace oral residuals. Patient performed oral care with setup assistance from SLP. She exhibited two instances of immediate coughing when taking sips of coffee, however no change in vocal quality and she did not exhibit coughing with other thin liquids (juice). SLP will continue to monitor this. Patient matched object pictures to words in field of three with 90% accuracy. She pointed to identify object pictures in field of 4 with 80% accuracy. She named "tee" (teeth), "dog", "mall" (ball), "salt" (salt shaker). She answered yes/no with object pictures with 85% accuracy, pointed to verb photos in field of two with 75% accuracy. Patient continues to benefit from skilled SLP intervention to maximize speech-language and swallow function prior to discharge.  Pain Pain Assessment Pain Scale: 0-10 Pain Score: 0-No pain  Therapy/Group: Individual Therapy  Angela Nevin, MA, CCC-SLP Speech Therapy

## 2020-07-13 NOTE — Progress Notes (Signed)
Physical Therapy Session Note  Patient Details  Name: Sharon Gilbert MRN: [id number] Date of Birth: December 19, 1940  Today's Date: 07/13/2020 PT Individual Time: 1300-1413 PT Individual Time Calculation (min): 73 min   Short Term Goals: Week 1:  PT Short Term Goal 1 (Week 1): Pt will perform bed mobility with CGA and no bed features PT Short Term Goal 2 (Week 1): Pt will perform bed<>chair transfers with LRAD and CGA PT Short Term Goal 3 (Week 1): Pt will ambulate at least 158ft with CGA and LRAD PT Short Term Goal 4 (Week 1): Pt will navigate up/down at least x4 steps with CGA and LRAD  Skilled Therapeutic Interventions/Progress Updates:    Pt received sleeping in recliner, awakens easily to voice, agreeable to PT session. She appears to deny any pain. Continues to show significant expressive aphasia, mostly unintelligble vocalizations. Sit<>stand with CGA from recliner with no AD. She ambulated with CGA from recliner to sink where she brushed her teeth with setupA, using LUE. She was able to maintain standing balance while brushing teeth with CGA. Noted poor orofacial/bucal strength with associated drooling and water leaking from lips while rinsing. Stand<>sit with CGA to w/c where she was transported to main therapy gym for time management. Performed standing balance tasks using peg board and hand-over-hand assist for RUE to reach, grasp, and place peg's (mostly totalA for grasp). She then removed the pegs, placing back into bucket, with LUE. She completed this standing task with CGA and no UE support. After seated rest, she then performed gait training without AD, stepping over x6 cones with minA for stability and lateral stepping L<>R over x6 cones with minA. During this activity, she was frequently circumduct her LE over obstacle rather than stepping over it; aphasia likely limiting. She then picked up x6 cones with CGA/minA with LUE. After seated rest break, she then ambulated from main therapy gym  to ortho gym (~170ft) with minA and no AD, noted increased lateral sway with longer distance gait as well as running into objects on R side (doorway thresholds, environmental objects). Cues for correcting but limited by aphasia. After seated rest break, then performed 1x10 step-ups on 6inch platform with modA and no BUE support, increased difficulty managing RLE and coordinating R foot placement. Also performed 1x10 standing toe taps on 6inch platform with minA and no BUE support. After seated rest, performed NMR on blue air-ex foam pad, unsupported with feet apart. Required minA for stepping onto airex and minA guard for stability while standing. Performed cervical rotations L<>R while standing on foam pad, again requiring minA for stability. Also performed standing reaching tasks while on blue air-ex with hand-over-hand assist for RUE to grasp and place cones, working on RUE coordination, attention, and strength. Stand<>pivot with CGA from mat table to w/c, and WC transport back to her room with totalA for time management. Stand<>pivot with CGA from w/c to EOB, sit>supine with CGA. She remained semi-reclined in bed with bed alarm on, 3/4 rails up, needs in reach at end of session.  Therapy Documentation Precautions:  Precautions Precautions: Fall Precaution Comments: R inattention, R HB weakness (RUE>RLE), expressive > receptive aphasia Restrictions Weight Bearing Restrictions: No  Therapy/Group: Individual Therapy  Galia Rahm P Earlin Sweeden PT 07/13/2020, 1:24 PM

## 2020-07-13 NOTE — Progress Notes (Signed)
Occupational Therapy Session Note  Patient Details  Name: Sharon Gilbert MRN: [id number] Date of Birth: 1941/09/18  Today's Date: 07/13/2020 OT Individual Time: 1000-1100 OT Individual Time Calculation (min): 60 min    Short Term Goals: Week 1:  OT Short Term Goal 1 (Week 1): Pt will complete UB dressing with supervision OT Short Term Goal 2 (Week 1): Pt will complete bathing with CGA. OT Short Term Goal 3 (Week 1): Pt will complete LB dressing with supervision. OT Short Term Goal 4 (Week 1): Pt will complete toilet transfer with supervision.  Skilled Therapeutic Interventions/Progress Updates:    Pt sitting up in recliner, no signs of pain throughout session, agreeable to OT session.  Pt ambulated recliner to bathroom for shower level bathing and stand to sit at TTB with CGA.  Pt doffed shirt overhead with min assist and doffed brief and pants with min assist.  Pt completed UB bathing with min assist and LB bathing with CGA.  Pt attempting to wash LUE using right however due to impaired grip, hand over hand provided to secure wash cloth in hand.  Pt donned shirt with mod assist and VC to orient shirt front from back.  Pt donned brief and pants with min assist to thread over feet and pull over right hip.  Pt ambulated to large gym with CGA due to avoid doorframes on right side.  Pt participated in neuro re-ed right wrist and digits using visual demonstration and tactile cues to facilitate wrist and digit extension/flexion.  Pt ambulated back to room with CGA, no LOB noted and returned to recliner.  Call bell in reach, seat belt alarm on.  Therapy Documentation Precautions:  Precautions Precautions: Fall Precaution Comments: R inattention, R HB weakness (RUE>RLE), expressive > receptive aphasia Restrictions Weight Bearing Restrictions: No    Therapy/Group: Individual Therapy  Amie Critchley 07/13/2020, 12:36 PM

## 2020-07-13 NOTE — Progress Notes (Signed)
[place] PHYSICAL MEDICINE & REHABILITATION PROGRESS NOTE  Subjective/Complaints: Patient seen sitting up in her chair this morning.  She indicates she slept well overnight.  No reported issues overnight.  She appears to indicate she had a good first day of therapy yesterday.  ROS: Limited due to aphasia  Objective: Vital Signs: Blood pressure (!) 112/53, pulse 68, temperature 98.3 F (36.8 C), resp. rate 16, height 5\' 5"  (1.651 m), weight 75.5 kg, SpO2 100 %. No results found. Recent Labs    07/12/20 0626  WBC 6.3  HGB 10.9*  HCT 33.2*  PLT 375   Recent Labs    07/12/20 0626  NA 137  K 3.5  CL 104  CO2 21*  GLUCOSE 107*  BUN 9  CREATININE 0.89  CALCIUM 9.2    Intake/Output Summary (Last 24 hours) at 07/13/2020 1040 Last data filed at 07/13/2020 0901 Gross per 24 hour  Intake 840 ml  Output --  Net 840 ml        Physical Exam: BP (!) 112/53 (BP Location: Left Arm)   Pulse 68   Temp 98.3 F (36.8 C)   Resp 16   Ht 5\' 5"  (1.651 m)   Wt 75.5 kg   SpO2 100%   BMI 27.70 kg/m  Constitutional: No distress . Vital signs reviewed. HENT: Normocephalic.  Atraumatic. Eyes: EOMI. No discharge. Cardiovascular: No JVD.  RRR. Respiratory: Normal effort.  No stridor.  Bilateral clear to auscultation. GI: Non-distended.  BS +. Skin: Warm and dry.  Intact. Psych: Normal mood.  Normal behavior. Musc: No edema in extremities.  No tenderness in extremities. Neuro: Alert Expressive>> receptive aphasia, unchanged Right facial weakness.  Motor: ?RUE: 4/5 shoulder abduction, elbow flexion/extension, distally?  0/5 RLE: 4+/5 proximal to distal  Assessment/Plan: 1. Functional deficits secondary to acute ischemic left ICA stroke which require 3+ hours per day of interdisciplinary therapy in a comprehensive inpatient rehab setting.  Physiatrist is providing close team supervision and 24 hour management of active medical problems listed below.  Physiatrist and rehab team  continue to assess barriers to discharge/monitor patient progress toward functional and medical goals   Care Tool:  Bathing    Body parts bathed by patient: Right arm, Chest, Abdomen, Front perineal area, Buttocks, Right upper leg, Left upper leg, Face   Body parts bathed by helper: Left arm, Left lower leg, Right lower leg     Bathing assist Assist Level: Minimal Assistance - Patient > 75%     Upper Body Dressing/Undressing Upper body dressing   What is the patient wearing?: Pull over shirt    Upper body assist Assist Level: Moderate Assistance - Patient 50 - 74%    Lower Body Dressing/Undressing Lower body dressing      What is the patient wearing?: Underwear/pull up, Pants     Lower body assist Assist for lower body dressing: Moderate Assistance - Patient 50 - 74%     Toileting Toileting    Toileting assist Assist for toileting: Minimal Assistance - Patient > 75%     Transfers Chair/bed transfer  Transfers assist     Chair/bed transfer assist level: Minimal Assistance - Patient > 75%     Locomotion Ambulation   Ambulation assist      Assist level: Minimal Assistance - Patient > 75% Assistive device: No Device Max distance: 50   Walk 10 feet activity   Assist     Assist level: Minimal Assistance - Patient > 75% Assistive device: No Device  Walk 50 feet activity   Assist    Assist level: Minimal Assistance - Patient > 75% Assistive device: No Device    Walk 150 feet activity   Assist Walk 150 feet activity did not occur: Safety/medical concerns (fatigue)         Walk 10 feet on uneven surface  activity   Assist Walk 10 feet on uneven surfaces activity did not occur: Safety/medical concerns         Wheelchair     Assist Will patient use wheelchair at discharge?: No             Wheelchair 50 feet with 2 turns activity    Assist            Wheelchair 150 feet activity     Assist           Medical Problem List and Plan: 1.    Global aphasia, right hemiparesis with impaired mobility and ADLs secondary to acute ischemic left ICA stroke  Continue CIR  WHO ordered 2.  L-ICA stent/Antithrombotics: -DVT/anticoagulation:  Pharmaceutical: Lovenox 40mg  q24H.              -antiplatelet therapy: On ASA/Brilinta 3. Pain Management: N/A 4. Mood: LCSW to follow for evaluation and support.              -antipsychotic agents: N/A 5. Neuropsych: This patient is not fully capable of making decisions on her own behalf. 6. Skin/Wound Care: Routine pressure relief measures.  7. Fluids/Electrolytes/Nutrition: Monitor I/Os.  8. HTN: Monitor BP, high graded R-ICA stenosis. Per neurology SBP goals 130-150 range.   Continue Lisinopril  HCTZ daily DC'd on 10/9 (already received morning dose on 10/8).   Relatively low on 10/8  Monitor with increased mobility 9. T2DM- diet controlled?: Hgb A1C-6.1. Will monitor BS ac/hs and use SSI for elevated BS as indicated.   Minimally elevated on 10/8  Monitor with increased mobility 10. Dyslipidemia: On Zocor 80 mg.  11. Intermittent Hypokalemia:   Potassium 3.5 on 10/7, labs ordered for Monday  Continue to monitor 12. ABLA:   Hemoglobin 10.9 on 10/7  Continue to monitor 13. Glaucoma: Stable on Xalatan and Cosopt.   14. Overweight (BMI 29.53): provide dietary counseling 15.  Post stroke dysphagia  D2 thins, advance diet as tolerated 16.  Mild transaminitis  ALT elevated on 10/7, continue to monitor given statin  Labs ordered for Monday  LOS: 2 days A FACE TO FACE EVALUATION WAS PERFORMED  Adyan Palau Thursday 07/13/2020, 10:40 AM

## 2020-07-13 NOTE — Progress Notes (Signed)
Orthopedic Tech Progress Note Patient Details:  Sharon Gilbert 07-Apr-1941 [id number] Called In order to [employer] for a [employer] Patient ID: Sharon Gilbert, female   DOB: [birth year], 79 y.o.   MRN: [id number]   Donald Pore 07/13/2020, 10:52 AM

## 2020-07-14 ENCOUNTER — Inpatient Hospital Stay (HOSPITAL_COMMUNITY): Payer: Medicare HMO | Admitting: Speech Pathology

## 2020-07-14 ENCOUNTER — Inpatient Hospital Stay (HOSPITAL_COMMUNITY): Payer: Medicare HMO | Admitting: Occupational Therapy

## 2020-07-14 ENCOUNTER — Inpatient Hospital Stay (HOSPITAL_COMMUNITY): Payer: Medicare HMO

## 2020-07-14 LAB — GLUCOSE, CAPILLARY
Glucose-Capillary: 114 mg/dL — ABNORMAL HIGH (ref 70–99)
Glucose-Capillary: 98 mg/dL (ref 70–99)
Glucose-Capillary: 112 mg/dL — ABNORMAL HIGH (ref 70–99)
Glucose-Capillary: 112 mg/dL — ABNORMAL HIGH (ref 70–99)

## 2020-07-14 NOTE — Progress Notes (Signed)
Speech Language Pathology Daily Session Note  Patient Details  Name: ALOIS MINCER MRN: [id number] Date of Birth: 1940-10-20  Today's Date: 07/14/2020 SLP Individual Time: 1445-1530 SLP Individual Time Calculation (min): 45 min  Short Term Goals: Week 1: SLP Short Term Goal 1 (Week 1): Patient will identify object/noun pictures in field of 6 with 85% accuracy. SLP Short Term Goal 2 (Week 1): Patient will point to verb/action pictures in field of 3-4 with 80% accuracy. SLP Short Term Goal 3 (Week 1): Patient will imitate at word level with maxA cues for 70% accuracy. SLP Short Term Goal 4 (Week 1): Patient will use task-specific communication board during structured tasks with minA SLP Short Term Goal 5 (Week 1): Patient will tolerate trials of Dys 3 solids with trace to minimal oral residuals and minA to clear.  Skilled Therapeutic Interventions:   Patient seen for skilled ST session focusing on expressive-receptive language goals. Patient was alert and sitting in recliner when SLP arrived. She pointed to object pictures in field of 4 with 85% accuracy. She was able to produce some phonemes of words when she attempted to name them but she continues with severely impaired expressive language. She matched verb photos to printed words field of two, ie picture of boy brushing teeth and printed words "brushing teeth" and "brushing hair" with 80% accuracy following SLP demonstrating and 2 trials. Patient continues to pair gestures with facial expressions and verbalizations to communicate wants/needs, such as pointing to tissues and table to indicate she wanted to wipe off the salt that had spilled. Patient seems pleased when she is successful in speech tasks and continues to be highly motivated. She continues to benefit from skilled SLP intervention to maximize expressive-receptive and swallow function goals prior to discharge.  Pain Pain Assessment Pain Scale: 0-10 Pain Score: 0-No pain Faces Pain  Scale: No hurt  Therapy/Group: Individual Therapy  Angela Nevin, MA, CCC-SLP Speech Therapy

## 2020-07-14 NOTE — Progress Notes (Signed)
[place] PHYSICAL MEDICINE & REHABILITATION PROGRESS NOTE  Subjective/Complaints:   Pt indicates "OK"- slept OK- denies pain- LBM in last 1-2 days- actually was 10/6- - denies constipation. If no BM by tomorrow, will give meds.   Voiding well.    ROS: limited due to aphasia  Objective: Vital Signs: Blood pressure (!) 106/57, pulse 70, temperature 98.4 F (36.9 C), temperature source Oral, resp. rate 17, height 5\' 5"  (1.651 m), weight 78.3 kg, SpO2 100 %. No results found. Recent Labs    07/12/20 0626  WBC 6.3  HGB 10.9*  HCT 33.2*  PLT 375   Recent Labs    07/12/20 0626  NA 137  K 3.5  CL 104  CO2 21*  GLUCOSE 107*  BUN 9  CREATININE 0.89  CALCIUM 9.2    Intake/Output Summary (Last 24 hours) at 07/14/2020 1058 Last data filed at 07/14/2020 0700 Gross per 24 hour  Intake 840 ml  Output --  Net 840 ml        Physical Exam: BP (!) 106/57 (BP Location: Left Arm)   Pulse 70   Temp 98.4 F (36.9 C) (Oral)   Resp 17   Ht 5\' 5"  (1.651 m)   Wt 78.3 kg   SpO2 100%   BMI 28.73 kg/m  Constitutional: No distress . Vital signs reviewed. Sitting up in bed; appropriate, aphasic- trying to interact, NAD HENT: Normocephalic.  Atraumatic. Eyes: EOMI. No discharge. Cardiovascular: RRR, no JVD Respiratory: CTA B/L- no W/R/R- good air movement GI: Soft, NT, ND, (+)BS  Skin: Warm and dry.  Intact. Psych: appropriate Musc: No edema in extremities.  No tenderness in extremities. Neuro: Alert Expressive>> receptive aphasia, unchanged Right facial weakness.  Motor: ?RUE: 4/5 shoulder abduction, elbow flexion/extension, distally?  0/5 RLE: 4+/5 proximal to distal  Assessment/Plan: 1. Functional deficits secondary to acute ischemic left ICA stroke which require 3+ hours per day of interdisciplinary therapy in a comprehensive inpatient rehab setting.  Physiatrist is providing close team supervision and 24 hour management of active medical problems listed  below.  Physiatrist and rehab team continue to assess barriers to discharge/monitor patient progress toward functional and medical goals   Care Tool:  Bathing    Body parts bathed by patient: Right arm, Chest, Abdomen, Front perineal area, Buttocks, Right upper leg, Left upper leg, Face, Left lower leg, Right lower leg   Body parts bathed by helper: Left arm, Left lower leg, Right lower leg     Bathing assist Assist Level: Minimal Assistance - Patient > 75%     Upper Body Dressing/Undressing Upper body dressing   What is the patient wearing?: Pull over shirt    Upper body assist Assist Level: Moderate Assistance - Patient 50 - 74%    Lower Body Dressing/Undressing Lower body dressing      What is the patient wearing?: Pants, Incontinence brief     Lower body assist Assist for lower body dressing: Moderate Assistance - Patient 50 - 74%     Toileting Toileting    Toileting assist Assist for toileting: Moderate Assistance - Patient 50 - 74%     Transfers Chair/bed transfer  Transfers assist     Chair/bed transfer assist level: Minimal Assistance - Patient > 75%     Locomotion Ambulation   Ambulation assist      Assist level: Minimal Assistance - Patient > 75% Assistive device: No Device Max distance: 50   Walk 10 feet activity   Assist  Assist level: Minimal Assistance - Patient > 75% Assistive device: No Device   Walk 50 feet activity   Assist    Assist level: Minimal Assistance - Patient > 75% Assistive device: No Device    Walk 150 feet activity   Assist Walk 150 feet activity did not occur: Safety/medical concerns (fatigue)         Walk 10 feet on uneven surface  activity   Assist Walk 10 feet on uneven surfaces activity did not occur: Safety/medical concerns         Wheelchair     Assist Will patient use wheelchair at discharge?: No             Wheelchair 50 feet with 2 turns activity    Assist             Wheelchair 150 feet activity     Assist          Medical Problem List and Plan: 1.    Global aphasia, right hemiparesis with impaired mobility and ADLs secondary to acute ischemic left ICA stroke  Continue CIR  WHO ordered 2.  L-ICA stent/Antithrombotics: -DVT/anticoagulation:  Pharmaceutical: Lovenox 40mg  q24H.              -antiplatelet therapy: On ASA/Brilinta 3. Pain Management: N/A 4. Mood: LCSW to follow for evaluation and support.              -antipsychotic agents: N/A 5. Neuropsych: This patient is not fully capable of making decisions on her own behalf. 6. Skin/Wound Care: Routine pressure relief measures.  7. Fluids/Electrolytes/Nutrition: Monitor I/Os.  8. HTN: Monitor BP, high graded R-ICA stenosis. Per neurology SBP goals 130-150 range.   Continue Lisinopril  HCTZ daily DC'd on 10/9 (already received morning dose on 10/8).   Relatively low on 10/8  10/9- BP 106/57- soft- will monitor since HCTZ just got d/c'd. Don't want too low.   Monitor with increased mobility 9. T2DM- diet controlled?: Hgb A1C-6.1. Will monitor BS ac/hs and use SSI for elevated BS as indicated.   Minimally elevated on 10/8  10/9- BGs 98-131- OK- con't regimen  Monitor with increased mobility 10. Dyslipidemia: On Zocor 80 mg.  11. Intermittent Hypokalemia:   Potassium 3.5 on 10/7,   10/9- labs Monday   Continue to monitor 12. ABLA:   Hemoglobin 10.9 on 10/7  Continue to monitor 13. Glaucoma: Stable on Xalatan and Cosopt.   14. Overweight (BMI 29.53): provide dietary counseling 15.  Post stroke dysphagia  D2 thins, advance diet as tolerated 16.  Mild transaminitis  ALT elevated on 10/7, continue to monitor given statin  10/9- CMP Monday  LOS: 3 days A FACE TO FACE EVALUATION WAS PERFORMED  Sharon Gilbert 07/14/2020, 10:58 AM

## 2020-07-14 NOTE — Progress Notes (Signed)
Occupational Therapy Session Note  Patient Details  Name: Sharon Gilbert MRN: [id number] Date of Birth: [birth year]  Today's Date: 07/14/2020 OT Individual Time: 4098-1191 OT Individual Time Calculation (min): 40 min    Short Term Goals: Week 1:  OT Short Term Goal 1 (Week 1): Pt will complete UB dressing with supervision OT Short Term Goal 2 (Week 1): Pt will complete bathing with CGA. OT Short Term Goal 3 (Week 1): Pt will complete LB dressing with supervision. OT Short Term Goal 4 (Week 1): Pt will complete toilet transfer with supervision.  Skilled Therapeutic Interventions/Progress Updates:    Session 1: (4782-9562)  Pt sitting on the EOB with NT to start session working on dressing. When asked if she needed to wash up, she was able to shake her head yes.  She completed transfer to the wheelchair over at the sink for bathing and dressing with min assist.  She completed oral hygiene with setup.  She consistently tries to integrate the RUE throughout grooming and bathing tasks, but needs mod assist overall secondary to decreased active wrist and digit function.  She completed all bathing sit to stand with min assist.  Therapist provided hand over hand for washing the LUE with the right hand as well as for washing the right foot, as she could not reach it efficiently.  She was able to stand with min guard assist for washing her front and back peri area with the LUE.  Mod assist was needed for donning new brief and pants with mod assist as well for donning pullover following hemi-techniques.  Slight motor planning difficulty noted when attempting to apply deodorant.  She needed mod assist for donning gripper socks with one handed method as well.  Finished session with transfer to the recliner from the wheelchair to rest.  Call button and phone in reach as well as safety alarm belt.   Session 2: (1352-1430)  Pt in recliner to start session.  Had her stand without an assistive device and ambulate to  the sink to clean her face where she had some food particles left on it. She then ambulated to the ortho gym where she sat on a mat and worked on right hand digit and wrist flexion and extension.  NMES was applied to the flexors and extensors of the right hand.  Intensity on the flexors was at 21 with intensity on the extensors at 25.  She was instructed to assist with the digit flexion and extension along with the machine as it facilitated active movement.  She tolerated 15 mins of alternating stimulation without any adverse reactions.  Settings were as follows: PPS 35, Pulse Width at 300, ramp up 2 seconds with time on 10 seconds for each with no lag in between.  Returned to the room at the end of the session with pt left sitting up in the recliner in preparation for SLP session in 15 mins.  Call button and phone in reach with safety belt in place.     Therapy Documentation Precautions:  Precautions Precautions: Fall Precaution Comments: R inattention, R HB weakness (RUE>RLE), expressive > receptive aphasia Restrictions Weight Bearing Restrictions: No  Pain: Pain Assessment Pain Scale: Faces Pain Score: 0-No pain ADL: See Care Tool Section for some details of mobility and selfcare  Therapy/Group: Individual Therapy  Brienna Bass OTR/L  07/14/2020, 12:46 PM

## 2020-07-14 NOTE — Progress Notes (Signed)
Physical Therapy Session Note  Patient Details  Name: Sharon Gilbert MRN: [id number] Date of Birth: 1941-09-11  Today's Date: 07/14/2020 PT Individual Time: 0900-1016 PT Individual Time Calculation (min): 76 min   Short Term Goals: Week 1:  PT Short Term Goal 1 (Week 1): Pt will perform bed mobility with CGA and no bed features PT Short Term Goal 2 (Week 1): Pt will perform bed<>chair transfers with LRAD and CGA PT Short Term Goal 3 (Week 1): Pt will ambulate at least 132ft with CGA and LRAD PT Short Term Goal 4 (Week 1): Pt will navigate up/down at least x4 steps with CGA and LRAD  Skilled Therapeutic Interventions/Progress Updates:    Patient received supine in bed agreeable to PT. She shook her head "no" when asked if she had any pain. Patient able to verbalize simple words, such as "yes," "no," and "okay" throughout session. She was able to ambulate to bathroom with CGA and verbal cuing. Patient indicating that she wanted to put pants on and new brief. Peri hygiene completed with supervision and verbal cuing- she was able to don pants seated with ModA and finish pulling up clothing with MinA. R UE engagement noted, but no functional success. Patient standing at sink for hand hygiene with CGA. Apraxia noted as patient got soap on her hands and then turned off faucet without washing soap off until PT instructed her to do so. PT propelling patient in wc to therapy gym for time management. She was able to transfer to therapy mat via stand pivot with stand by assist. Dynamic sitting task completed while attending to R. Tasked to complete leggo model following picture. Patient required Mod verbal cuing for accuracy when following level 1 picture, but Max verbal cuing to follow level 2 picture for accuracy. Patient initially had greatest difficulty copying pieces that were meant to be perpendicular to base often placing them in parallel instead. R UE engagement noted without functional success again.  Patient completing standing dynamic task on foam with CGA. Tasked to make eggs in egg crate match picture. Max verbal cuing needed for accuracy. Patient often turning to PT for insight prior to placing egg in crate to check for accuracy. Step ups onto 6" box with CGA, 2x20 with seated rest break between. Patient with mild posterior lean noted during transition onto box. x10 mins on NuStep for reciprocal stepping completed. Patient ambulated back to room with CGA and no AD for carryover from NuStep with good success. Bed alarm on, call light within reach.   Therapy Documentation Precautions:  Precautions Precautions: Fall Precaution Comments: R inattention, R HB weakness (RUE>RLE), expressive > receptive aphasia Restrictions Weight Bearing Restrictions: No    Therapy/Group: Individual Therapy  Elizebeth Koller, PT, DPT, CBIS 07/14/2020, 7:40 AM

## 2020-07-15 LAB — GLUCOSE, CAPILLARY
Glucose-Capillary: 132 mg/dL — ABNORMAL HIGH (ref 70–99)
Glucose-Capillary: 112 mg/dL — ABNORMAL HIGH (ref 70–99)
Glucose-Capillary: 116 mg/dL — ABNORMAL HIGH (ref 70–99)
Glucose-Capillary: 172 mg/dL — ABNORMAL HIGH (ref 70–99)

## 2020-07-15 MED ORDER — LISINOPRIL 10 MG PO TABS
10.0000 mg | ORAL_TABLET | Freq: Every day | ORAL | Status: DC
  Administered 2020-07-16 – 2020-07-26 (×10): 10 mg via ORAL
  Filled 2020-07-15 (×11): qty 1

## 2020-07-15 MED ORDER — SORBITOL 70 % SOLN
30.0000 mL | Freq: Every day | Status: DC | PRN
  Administered 2020-07-23 – 2020-07-24 (×2): 30 mL via ORAL
  Filled 2020-07-15 (×3): qty 30

## 2020-07-15 MED ORDER — BISACODYL 10 MG RE SUPP: 10.0000 mg | Freq: Every day | RECTAL | Status: DC | PRN

## 2020-07-15 NOTE — Progress Notes (Addendum)
[place] PHYSICAL MEDICINE & REHABILITATION PROGRESS NOTE  Subjective/Complaints:   Pt indicated ate breakfast- ~50-75% of meal this AM- Per nursing, needs to have BM- already had Miralax and suppository - will order Sorbitol prn.     ROS: limited due to severe aphasia  Objective: Vital Signs: Blood pressure 94/66, pulse 72, temperature 98.8 F (37.1 C), resp. rate 16, height 5\' 5"  (1.651 m), weight 78.3 kg, SpO2 100 %. No results found. No results for input(s): WBC, HGB, HCT, PLT in the last 72 hours. No results for input(s): NA, K, CL, CO2, GLUCOSE, BUN, CREATININE, CALCIUM in the last 72 hours.  Intake/Output Summary (Last 24 hours) at 07/15/2020 1212 Last data filed at 07/15/2020 0830 Gross per 24 hour  Intake 640 ml  Output --  Net 640 ml        Physical Exam: BP 94/66   Pulse 72   Temp 98.8 F (37.1 C)   Resp 16   Ht 5\' 5"  (1.651 m)   Wt 78.3 kg   SpO2 100%   BMI 28.73 kg/m  Constitutional: No distress . Sitting up after breakfast, plate 09/14/2020 empty, NAD; severely aphasic- said a few words, didn't make sense HENT: Normocephalic.  Atraumatic. Eyes: EOMI. No discharge. Cardiovascular: RRR Respiratory: CTA B/L- no W/R/R- good air movement GI: soft, NT;  ND; (+) hypoactive BS  Skin: Warm and dry.  Intact. Psych: appropriate Musc: No edema in extremities.  No tenderness in extremities. Neuro: Alert- appropriate Expressive>> receptive aphasia, slightly worse this AM Right facial weakness.  Motor: ?RUE: 4/5 shoulder abduction, elbow flexion/extension, distally?  0/5 RLE: 4+/5 proximal to distal  Assessment/Plan: 1. Functional deficits secondary to acute ischemic left ICA stroke which require 3+ hours per day of interdisciplinary therapy in a comprehensive inpatient rehab setting.  Physiatrist is providing close team supervision and 24 hour management of active medical problems listed below.  Physiatrist and rehab team continue to assess barriers to  discharge/monitor patient progress toward functional and medical goals   Care Tool:  Bathing    Body parts bathed by patient: Right arm, Chest, Abdomen, Front perineal area, Buttocks, Right upper leg, Left upper leg, Face, Right lower leg   Body parts bathed by helper: Left lower leg, Left arm     Bathing assist Assist Level: Minimal Assistance - Patient > 75%     Upper Body Dressing/Undressing Upper body dressing   What is the patient wearing?: Pull over shirt    Upper body assist Assist Level: Moderate Assistance - Patient 50 - 74%    Lower Body Dressing/Undressing Lower body dressing      What is the patient wearing?: Pants, Incontinence brief     Lower body assist Assist for lower body dressing: Moderate Assistance - Patient 50 - 74%     Toileting Toileting    Toileting assist Assist for toileting: Moderate Assistance - Patient 50 - 74%     Transfers Chair/bed transfer  Transfers assist     Chair/bed transfer assist level: Minimal Assistance - Patient > 75%     Locomotion Ambulation   Ambulation assist      Assist level: Minimal Assistance - Patient > 75% Assistive device: No Device Max distance: 50   Walk 10 feet activity   Assist     Assist level: Minimal Assistance - Patient > 75% Assistive device: No Device   Walk 50 feet activity   Assist    Assist level: Minimal Assistance - Patient > 75% Assistive device:  No Device    Walk 150 feet activity   Assist Walk 150 feet activity did not occur: Safety/medical concerns (fatigue)         Walk 10 feet on uneven surface  activity   Assist Walk 10 feet on uneven surfaces activity did not occur: Safety/medical concerns         Wheelchair     Assist Will patient use wheelchair at discharge?: No             Wheelchair 50 feet with 2 turns activity    Assist            Wheelchair 150 feet activity     Assist          Medical Problem List and  Plan: 1.    Global aphasia, right hemiparesis with impaired mobility and ADLs secondary to acute ischemic left ICA stroke  Continue CIR  WHO ordered 2.  L-ICA stent/Antithrombotics: -DVT/anticoagulation:  Pharmaceutical: Lovenox 40mg  q24H.              -antiplatelet therapy: On ASA/Brilinta 3. Pain Management: N/A 4. Mood: LCSW to follow for evaluation and support.              -antipsychotic agents: N/A 5. Neuropsych: This patient is not fully capable of making decisions on her own behalf. 6. Skin/Wound Care: Routine pressure relief measures.  7. Fluids/Electrolytes/Nutrition: Monitor I/Os.  8. HTN: Monitor BP, high graded R-ICA stenosis. Per neurology SBP goals 130-150 range.   Continue Lisinopril  HCTZ daily DC'd on 10/9 (already received morning dose on 10/8).   Relatively low on 10/8  10/9- BP 106/57- soft- will monitor since HCTZ just got d/c'd. Don't want too low.  10/10- BP 94/66- too low- reduced Lisinopril to 10 mg daily- will get reduced dose first time on Monday   Monitor with increased mobility 9. T2DM- diet controlled?: Hgb A1C-6.1. Will monitor BS ac/hs and use SSI for elevated BS as indicated.   Minimally elevated on 10/8  10/10- minimal elevation -98-132 BGs- con't regimen  Monitor with increased mobility 10. Dyslipidemia: On Zocor 80 mg.  11. Intermittent Hypokalemia:   Potassium 3.5 on 10/7,   101/10- labs Monday   Continue to monitor 12. ABLA:   Hemoglobin 10.9 on 10/7  Continue to monitor 13. Glaucoma: Stable on Xalatan and Cosopt.   14. Overweight (BMI 29.53): provide dietary counseling 15.  Post stroke dysphagia  D2 thins, advance diet as tolerated  10/10- tolerating current diet- ate ~75% of breakfast today 16.  Mild transaminitis  ALT elevated on 10/7, continue to monitor given statin  10/9- CMP Monday 17. Constipation  10/10- LBM 3 days ago- tried miralax and suppository- will give Sorbitol prn   LOS: 4 days A FACE TO FACE EVALUATION WAS  PERFORMED  Sharon Gilbert 07/15/2020, 12:12 PM

## 2020-07-15 NOTE — Plan of Care (Signed)
  Problem: RH BOWEL ELIMINATION Goal: RH STG MANAGE BOWEL WITH ASSISTANCE Description: STG Manage Bowel with min Assistance. Outcome: Progressing   Problem: RH BLADDER ELIMINATION Goal: RH STG MANAGE BLADDER WITH ASSISTANCE Description: STG Manage Bladder With min Assistance Outcome: Progressing   Problem: RH SAFETY Goal: RH STG ADHERE TO SAFETY PRECAUTIONS W/ASSISTANCE/DEVICE Description: STG Adhere to Safety Precautions With min Assistance/Device. Outcome: Progressing

## 2020-07-16 ENCOUNTER — Inpatient Hospital Stay (HOSPITAL_COMMUNITY): Payer: Medicare HMO

## 2020-07-16 ENCOUNTER — Inpatient Hospital Stay (HOSPITAL_COMMUNITY): Payer: Medicare HMO | Admitting: Occupational Therapy

## 2020-07-16 DIAGNOSIS — K5901 Slow transit constipation: Secondary | ICD-10-CM

## 2020-07-16 DIAGNOSIS — R7309 Other abnormal glucose: Secondary | ICD-10-CM

## 2020-07-16 LAB — COMPREHENSIVE METABOLIC PANEL
ALT: 38 U/L (ref 0–44)
AST: 29 U/L (ref 15–41)
Albumin: 3.2 g/dL — ABNORMAL LOW (ref 3.5–5.0)
Alkaline Phosphatase: 54 U/L (ref 38–126)
Anion gap: 12 (ref 5–15)
BUN: 18 mg/dL (ref 8–23)
CO2: 22 mmol/L (ref 22–32)
Calcium: 9.4 mg/dL (ref 8.9–10.3)
Chloride: 105 mmol/L (ref 98–111)
Creatinine, Ser: 0.84 mg/dL (ref 0.44–1.00)
GFR, Estimated: >60 mL/min (ref >60)
Glucose, Bld: 113 mg/dL — ABNORMAL HIGH (ref 70–99)
Potassium: 3.8 mmol/L (ref 3.5–5.1)
Sodium: 139 mmol/L (ref 135–145)
Total Bilirubin: 0.9 mg/dL (ref 0.3–1.2)
Total Protein: 7 g/dL (ref 6.5–8.1)

## 2020-07-16 LAB — CBC
HCT: 33 % — ABNORMAL LOW (ref 36.0–46.0)
Hemoglobin: 10.6 g/dL — ABNORMAL LOW (ref 12.0–15.0)
MCH: 27.9 pg (ref 26.0–34.0)
MCHC: 32.1 g/dL (ref 30.0–36.0)
MCV: 86.8 fL (ref 80.0–100.0)
Platelets: 427 10*3/uL — ABNORMAL HIGH (ref 150–400)
RBC: 3.8 MIL/uL — ABNORMAL LOW (ref 3.87–5.11)
RDW: 13.5 % (ref 11.5–15.5)
WBC: 7.4 10*3/uL (ref 4.0–10.5)
nRBC: 0 % (ref 0.0–0.2)

## 2020-07-16 LAB — GLUCOSE, CAPILLARY
Glucose-Capillary: 122 mg/dL — ABNORMAL HIGH (ref 70–99)
Glucose-Capillary: 100 mg/dL — ABNORMAL HIGH (ref 70–99)
Glucose-Capillary: 96 mg/dL (ref 70–99)
Glucose-Capillary: 97 mg/dL (ref 70–99)

## 2020-07-16 NOTE — Progress Notes (Signed)
Occupational Therapy Session Note  Patient Details  Name: Sharon Gilbert MRN: [id number] Date of Birth: [birth year]  Today's Date: 07/16/2020 OT Individual Time: 1416-1530 OT Individual Time Calculation (min): 74 min    Short Term Goals: Week 1:  OT Short Term Goal 1 (Week 1): Pt will complete UB dressing with supervision OT Short Term Goal 2 (Week 1): Pt will complete bathing with CGA. OT Short Term Goal 3 (Week 1): Pt will complete LB dressing with supervision. OT Short Term Goal 4 (Week 1): Pt will complete toilet transfer with supervision.  Skilled Therapeutic Interventions/Progress Updates:    Pt supine in bed, no c/o pain, agreeable to OT session.  Pt ambulated to ADL suite bathroom and completed TTB transfer with CGA.  Pt doffed shirt with min assist and doffed brief and pants with CGA.  Pt bathed UB with min assist for LUE and LB with CGA sitting and standing.  Pt donned shirt with min assist with poor carryover noted of compensatory technique needing TCs and visual cues to implement due to receptive/expressive aphasia.Pt donned brief and pants with mod assist to thread over feet and pull up on right side due to impaired right wrist and hand strength.  Pt completed TTB transfer and ambulated to room with CGA and intermittent visual and tactile cues to attend to right side due to noting pt not attending to right side and bumping into doorway and corners.  Pt able to attend with cueing and then avoids obstacles.  Pt participated in neuro re-ed of right wrist and hand and Ktape applied to dorsum of wrist and hand to facilitate extensors.  Pt completed wrist flex/ext, RD/UD, digital flex/ext.  2-/5 wrist extension and 1/5 digital extension noted.  Trace thumb IP flexion noted, however no abduction at this time.  Call bell in reach, seat belt alarm on.  Therapy Documentation Precautions:  Precautions Precautions: Fall Precaution Comments: R inattention, R HB weakness (RUE>RLE), expressive >  receptive aphasia Restrictions Weight Bearing Restrictions: No   Therapy/Group: Individual Therapy  Amie Critchley 07/16/2020, 5:13 PM

## 2020-07-16 NOTE — Progress Notes (Signed)
Speech Language Pathology Daily Session Note  Patient Details  Name: Sharon Gilbert MRN: [id number] Date of Birth: 04-29-1941  Today's Date: 07/16/2020 SLP Individual Time: 0832-0929 SLP Individual Time Calculation (min): 57 min  Short Term Goals: Week 1: SLP Short Term Goal 1 (Week 1): Patient will identify object/noun pictures in field of 6 with 85% accuracy. SLP Short Term Goal 2 (Week 1): Patient will point to verb/action pictures in field of 3-4 with 80% accuracy. SLP Short Term Goal 3 (Week 1): Patient will imitate at word level with maxA cues for 70% accuracy. SLP Short Term Goal 4 (Week 1): Patient will use task-specific communication board during structured tasks with minA SLP Short Term Goal 5 (Week 1): Patient will tolerate trials of Dys 3 solids with trace to minimal oral residuals and minA to clear.  Skilled Therapeutic Interventions:Skilled ST services focused on language skills. Pt expressed desire to change brief and clothes with gestures and in response to yes/no questions requiring min A multimodal cues. SLP assisted with changing brief and clothes, as well as transferring pt to chair with walker requiring min A verbal cues for problem solving. When SLP asked pt what name she goes by pt indicated a nickname but we unintelligible. SLP provided paper/pen for pt attempt to write name, but pt was unable to do so appearing to notice 50% of written errors. SLP provided a letter board, pt was unable to select letters in her nickname, but eventually verbalized "[name]" following sentence completion cues. Pt confirmed several times via yes/no questions and in witting that she goes by "[name]." SLP facilitated writing of name " [name]", pt demonstrated increase ability to notice written errors, trace dotted lines and eventually copy 4 letter name with 75% accuracy. SLP left pt with homework to practice tracing dotted letter lines and witting her name given spaces. SLP also facilitated picture  identification in a field of 5, pt demonstrated ability to select requested photo of object in 10/10 trials and select requested black/white drawing of object in 9/10 trials with both mod A sentence completion and demonstration cues. Pt was unable to identify help button on call bell throughout the session, however after SLP placed tape with the word "help" over call bell pt was able to return demonstration. Pt was left in room with call bell within reach and bed alarm set. SLP recommends to continue skilled services.     Pain Pain Assessment Pain Score: 0-No pain  Therapy/Group: Individual Therapy  Sharon Gilbert  [facility] 07/16/2020, 3:56 PM

## 2020-07-16 NOTE — Progress Notes (Signed)
[name]  Subjective/Complaints: Patient seen sitting up in bed this morning.  She indicates she slept well overnight.  No reported issues.  ROS: limited due to aphasia  Objective: Vital Signs: Blood pressure (!) 113/57, pulse 73, temperature 99.8 F (37.7 C), temperature source Oral, resp. rate 18, height 5\' 5"  (1.651 m), weight 74.3 kg, SpO2 97 %. No results found. Recent Labs    07/16/20 0653  WBC 7.4  HGB 10.6*  HCT 33.0*  PLT 427*   Recent Labs    07/16/20 0653  NA 139  K 3.8  CL 105  CO2 22  GLUCOSE 113*  BUN 18  CREATININE 0.84  CALCIUM 9.4    Intake/Output Summary (Last 24 hours) at 07/16/2020 0855 Last data filed at 07/15/2020 2030 Gross per 24 hour  Intake 460 ml  Output --  Net 460 ml        Physical Exam: BP (!) 113/57 (BP Location: Left Arm)   Pulse 73   Temp 99.8 F (37.7 C) (Oral)   Resp 18   Ht 5\' 5"  (1.651 m)   Wt 74.3 kg   SpO2 97%   BMI 27.26 kg/m  Constitutional: No distress . Vital signs reviewed. HENT: Normocephalic.  Atraumatic. Eyes: EOMI. No discharge. Cardiovascular: No JVD.  RRR. Respiratory: Normal effort.  No stridor.  Bilateral clear to auscultation. GI: Non-distended.  BS +. Skin: Warm and dry.  Intact. Psych: Normal mood.  Normal behavior. Musc: No edema in extremities.  No tenderness in extremities. Neuro: Alert Expressive>> receptive aphasia, stable Right facial weakness.  Motor: ?RUE: 4/5 shoulder abduction, elbow flexion/extension, distally?  0/5, appears unchanged RLE: 4+/5 proximal to distal  Assessment/Plan: 1. Functional deficits secondary to acute ischemic left ICA stroke which require 3+ hours per day of interdisciplinary therapy in a comprehensive inpatient rehab setting.  Physiatrist is providing close team supervision and 24 hour management of active medical problems listed below.  Physiatrist and rehab team continue to assess barriers to  discharge/monitor patient progress toward functional and medical goals   Care Tool:  Bathing    Body parts bathed by patient: Right arm, Chest, Abdomen, Front perineal area, Buttocks, Right upper leg, Left upper leg, Face, Right lower leg   Body parts bathed by helper: Left lower leg, Left arm     Bathing assist Assist Level: Minimal Assistance - Patient > 75%     Upper Body Dressing/Undressing Upper body dressing   What is the patient wearing?: Pull over shirt    Upper body assist Assist Level: Moderate Assistance - Patient 50 - 74%    Lower Body Dressing/Undressing Lower body dressing      What is the patient wearing?: Pants, Incontinence brief     Lower body assist Assist for lower body dressing: Moderate Assistance - Patient 50 - 74%     Toileting Toileting    Toileting assist Assist for toileting: Moderate Assistance - Patient 50 - 74%     Transfers Chair/bed transfer  Transfers assist     Chair/bed transfer assist level: Minimal Assistance - Patient > 75%     Locomotion Ambulation   Ambulation assist      Assist level: Minimal Assistance - Patient > 75% Assistive device: No Device Max distance: 50   Walk 10 feet activity   Assist     Assist level: Minimal Assistance - Patient > 75% Assistive device: No Device   Walk 50 feet activity   Assist  Assist level: Minimal Assistance - Patient > 75% Assistive device: No Device    Walk 150 feet activity   Assist Walk 150 feet activity did not occur: Safety/medical concerns (fatigue)         Walk 10 feet on uneven surface  activity   Assist Walk 10 feet on uneven surfaces activity did not occur: Safety/medical concerns         Wheelchair     Assist Will patient use wheelchair at discharge?: No             Wheelchair 50 feet with 2 turns activity    Assist            Wheelchair 150 feet activity     Assist          Medical Problem List and  Plan: 1.    Global aphasia, right hemiparesis with impaired mobility and ADLs secondary to acute ischemic left ICA stroke  Continue CIR  WHO nightly 2.  L-ICA stent/Antithrombotics: -DVT/anticoagulation:  Pharmaceutical: Lovenox 40mg  q24H.              -antiplatelet therapy: On ASA/Brilinta 3. Pain Management: N/A 4. Mood: LCSW to follow for evaluation and support.              -antipsychotic agents: N/A 5. Neuropsych: This patient is not fully capable of making decisions on her own behalf. 6. Skin/Wound Care: Routine pressure relief measures.  7. Fluids/Electrolytes/Nutrition: Monitor I/Os.  8. HTN: Monitor BP, high graded R-ICA stenosis. Per neurology SBP goals 130-150 range.   Lisinopril decreased on 10/11  HCTZ daily DC'd on 10/9  Relatively low on 10/11   Monitor with increased mobility 9. T2DM- diet controlled?: Hgb A1C-6.1. Will monitor BS ac/hs and use SSI for elevated BS as indicated.   Slightly labile on 10/11  Monitor with increased mobility 10. Dyslipidemia: On Zocor 80 mg.  11. Intermittent Hypokalemia:   Potassium 3.8 on 10/11  Continue to monitor 12. ABLA:   Hemoglobin 10.6 on 10/11  Continue to monitor 13. Glaucoma: Stable on Xalatan and Cosopt.   14. Overweight (BMI 29.53): provide dietary counseling 15.  Post stroke dysphagia  D2 thins, advance diet as tolerated 16.  Mild transaminitis: Resolved  ALT e within normal limits on 9/11 17. Constipation  Improving   LOS: 5 days A FACE TO FACE EVALUATION WAS PERFORMED  Sharon Gilbert 11/11 07/16/2020, 8:55 AM

## 2020-07-16 NOTE — Progress Notes (Signed)
Physical Therapy Session Note  Patient Details  Name: Sharon Gilbert MRN: [id number] Date of Birth: [birth year]  Today's Date: 07/16/2020 PT Individual Time: 1015-1100 PT Individual Time Calculation (min): 45 min   Short Term Goals: Week 1:  PT Short Term Goal 1 (Week 1): Pt will perform bed mobility with CGA and no bed features PT Short Term Goal 2 (Week 1): Pt will perform bed<>chair transfers with LRAD and CGA PT Short Term Goal 3 (Week 1): Pt will ambulate at least 174ft with CGA and LRAD PT Short Term Goal 4 (Week 1): Pt will navigate up/down at least x4 steps with CGA and LRAD  Skilled Therapeutic Interventions/Progress Updates:    Pt received sitting in recliner, agreeable to PT session. Appears to deny any pain when asked, limited by expressive aphasia. Sit<>stand with CGA from recliner with no AD, ambulated to bedroom sink to brush her teeth. She was able to perform oral care with minA for standing balance. She would try to use her RUE frequently during this task without cueing. Required minA for setting up toothbrush and she used LUE for oral care. Stand>sit with CGA to w/c and WC transport for time management from her room to main therapy gym. Performed gait training using x8 cones (spaced ~16ft apart) with minA and no AD, weaving in/out of cones and picking cones up from floor from standing position. Pt continues to show R inattention during gait, running into doorway's during turning R, cues for correction. After seated rest, then performed standing card matching game with cards placed on R side and she used LUE for reaching over midline, target's placed on R side as well to promote R attention. She completed the standing matching game with minA guard without BUE support, 100% accuracy with matching with only min cues provided at initial trials, progressing to no cues. After seated rest, she then performed another standing balance task with cognitive component with Lego structure. She was  instructed to re-create the Level 1 image using Lego blocks, she required minA guard for completing but she required mod/maxA cues for correct sequencing and setup. Blocks were setup on standing table, directed towards her R side, to again improve R attention. She then ambulated back to her room (~148ft) with minA and no AD, cues for normalizing gait, pacing, and safety reminders. Stand>sit with CGA to recliner with cues for BUE placement and safety awareness. She ended session seated in recliner with BLE elevated, belt alarm on, needs in reach.  Therapy Documentation Precautions:  Precautions Precautions: Fall Precaution Comments: R inattention, R HB weakness (RUE>RLE), expressive > receptive aphasia Restrictions Weight Bearing Restrictions: No  Therapy/Group: Individual Therapy  Srah Ake P Andrea Ferrer PT 07/16/2020, 10:36 AM

## 2020-07-17 ENCOUNTER — Inpatient Hospital Stay (HOSPITAL_COMMUNITY): Payer: Medicare HMO

## 2020-07-17 ENCOUNTER — Inpatient Hospital Stay (HOSPITAL_COMMUNITY): Payer: Medicare HMO | Admitting: Physical Therapy

## 2020-07-17 ENCOUNTER — Inpatient Hospital Stay (HOSPITAL_COMMUNITY): Payer: Medicare HMO | Admitting: Occupational Therapy

## 2020-07-17 LAB — GLUCOSE, CAPILLARY
Glucose-Capillary: 106 mg/dL — ABNORMAL HIGH (ref 70–99)
Glucose-Capillary: 117 mg/dL — ABNORMAL HIGH (ref 70–99)
Glucose-Capillary: 100 mg/dL — ABNORMAL HIGH (ref 70–99)
Glucose-Capillary: 109 mg/dL — ABNORMAL HIGH (ref 70–99)

## 2020-07-17 MED ORDER — POLYETHYLENE GLYCOL 3350 17 G PO PACK
17.0000 g | PACK | Freq: Every day | ORAL | Status: DC
  Administered 2020-07-18 – 2020-07-24 (×6): 17 g via ORAL
  Filled 2020-07-17 (×7): qty 1

## 2020-07-17 NOTE — Progress Notes (Signed)
[place] PHYSICAL MEDICINE & REHABILITATION PROGRESS NOTE  Subjective/Complaints: Patient seen sitting up in her chair this morning.  No reported issues overnight.  She does not appear to have any complaints.  ROS: limited due to aphasia.  Objective: Vital Signs: Blood pressure (!) 127/53, pulse 68, temperature 99.8 F (37.7 C), temperature source Oral, resp. rate 18, height 5\' 5"  (1.651 m), weight 74.3 kg, SpO2 99 %. No results found. Recent Labs    07/16/20 0653  WBC 7.4  HGB 10.6*  HCT 33.0*  PLT 427*   Recent Labs    07/16/20 0653  NA 139  K 3.8  CL 105  CO2 22  GLUCOSE 113*  BUN 18  CREATININE 0.84  CALCIUM 9.4    Intake/Output Summary (Last 24 hours) at 07/17/2020 0858 Last data filed at 07/17/2020 0530 Gross per 24 hour  Intake 540 ml  Output 140 ml  Net 400 ml        Physical Exam: BP (!) 127/53 (BP Location: Left Arm)   Pulse 68   Temp 99.8 F (37.7 C) (Oral)   Resp 18   Ht 5\' 5"  (1.651 m)   Wt 74.3 kg   SpO2 99%   BMI 27.26 kg/m  Constitutional: No distress . Vital signs reviewed. HENT: Normocephalic.  Atraumatic. Eyes: EOMI. No discharge. Cardiovascular: No JVD.  RRR. Respiratory: Normal effort.  No stridor.  Bilateral clear to auscultation. GI: Non-distended.  BS +. Skin: Warm and dry.  Intact. Psych: Normal mood.  Normal behavior. Musc: No edema in extremities.  No tenderness in extremities. Neuro: Alert Expressive>> receptive aphasia, unchanged Right facial weakness.  Motor: ?RUE: 4/5 shoulder abduction, elbow flexion/extension, distally?  1+ /5 RLE: 4+/5 proximal to distal  Assessment/Plan: 1. Functional deficits secondary to acute ischemic left ICA stroke which require 3+ hours per day of interdisciplinary therapy in a comprehensive inpatient rehab setting.  Physiatrist is providing close team supervision and 24 hour management of active medical problems listed below.  Physiatrist and rehab team continue to assess barriers  to discharge/monitor patient progress toward functional and medical goals   Care Tool:  Bathing    Body parts bathed by patient: Right arm, Chest, Abdomen, Front perineal area, Buttocks, Right upper leg, Left upper leg, Face, Right lower leg, Left lower leg   Body parts bathed by helper: Left arm     Bathing assist Assist Level: Minimal Assistance - Patient > 75%     Upper Body Dressing/Undressing Upper body dressing   What is the patient wearing?: Pull over shirt    Upper body assist Assist Level: Minimal Assistance - Patient > 75%    Lower Body Dressing/Undressing Lower body dressing      What is the patient wearing?: Pants, Incontinence brief     Lower body assist Assist for lower body dressing: Moderate Assistance - Patient 50 - 74%     Toileting Toileting    Toileting assist Assist for toileting: Moderate Assistance - Patient 50 - 74%     Transfers Chair/bed transfer  Transfers assist     Chair/bed transfer assist level: Minimal Assistance - Patient > 75%     Locomotion Ambulation   Ambulation assist      Assist level: Minimal Assistance - Patient > 75% Assistive device: No Device Max distance: 50   Walk 10 feet activity   Assist     Assist level: Minimal Assistance - Patient > 75% Assistive device: No Device   Walk 50 feet activity  Assist    Assist level: Minimal Assistance - Patient > 75% Assistive device: No Device    Walk 150 feet activity   Assist Walk 150 feet activity did not occur: Safety/medical concerns (fatigue)         Walk 10 feet on uneven surface  activity   Assist Walk 10 feet on uneven surfaces activity did not occur: Safety/medical concerns         Wheelchair     Assist Will patient use wheelchair at discharge?: No             Wheelchair 50 feet with 2 turns activity    Assist            Wheelchair 150 feet activity     Assist          Medical Problem List and  Plan: 1.    Global aphasia, right hemiparesis with impaired mobility and ADLs secondary to acute ischemic left ICA stroke  Continue CIR  WHO nightly 2.  L-ICA stent/Antithrombotics: -DVT/anticoagulation:  Pharmaceutical: Lovenox 40mg  q24H.              -antiplatelet therapy: On ASA/Brilinta 3. Pain Management: N/A 4. Mood: LCSW to follow for evaluation and support.              -antipsychotic agents: N/A 5. Neuropsych: This patient is not fully capable of making decisions on her own behalf. 6. Skin/Wound Care: Routine pressure relief measures.  7. Fluids/Electrolytes/Nutrition: Monitor I/Os.  8. HTN: Monitor BP, high graded R-ICA stenosis. Per neurology SBP goals 130-150 range.   Lisinopril decreased on 10/11  HCTZ daily DC'd on 10/9  Improving on 10/12  Monitor with increased mobility 9. T2DM- diet controlled?: Hgb A1C-6.1. Will monitor BS ac/hs and use SSI for elevated BS as indicated.   Controlled on 10/12  Monitor with increased mobility 10. Dyslipidemia: On Zocor 80 mg.  11. Intermittent Hypokalemia:   Potassium 3.8 on 10/11  Continue to monitor 12. ABLA:   Hemoglobin 10.6 on 10/11  Continue to monitor 13. Glaucoma: Stable on Xalatan and Cosopt.   14. Overweight (BMI 29.53): provide dietary counseling 15.  Post stroke dysphagia  D2 thins, advance diet as tolerated 16.  Mild transaminitis: Resolved  ALT e within normal limits on 9/11 17.  Slow transit constipation  Bowel meds scheduled on 10/12   LOS: 6 days A FACE TO FACE EVALUATION WAS PERFORMED  Kemesha Mosey 12/12 07/17/2020, 8:58 AM

## 2020-07-17 NOTE — Progress Notes (Signed)
Physical Therapy Session Note  Patient Details  Name: Sharon Gilbert MRN: [id number] Date of Birth: 1941/02/03  Today's Date: 07/17/2020 PT Individual Time: 1430-1530 PT Individual Time Calculation (min): 60 min   Short Term Goals: Week 1:  PT Short Term Goal 1 (Week 1): Pt will perform bed mobility with CGA and no bed features PT Short Term Goal 2 (Week 1): Pt will perform bed<>chair transfers with LRAD and CGA PT Short Term Goal 3 (Week 1): Pt will ambulate at least 17ft with CGA and LRAD PT Short Term Goal 4 (Week 1): Pt will navigate up/down at least x4 steps with CGA and LRAD  Skilled Therapeutic Interventions/Progress Updates:    pt received in recliner and agreeable to therapy. Pt reported she needed to use restroom, directed in gait Hand held assist to toilet Min A for stability. Pt able to (+)bladder void and setup for self hygiene. Pt impulsive with transfers and required VC for use grab bar for balance several times and attempted multiple times to bend forward and pull brief and pants into place or over hips, min A for stability and single step cues to complete task safely. Pt directed in gait to WC, 10' Hand held assist min A. Pt taken to gym in [facility] for time, total A. Pt directed in standing balance training on level surface with reaching outside BOS, no external support for cards to match with duel task of matching for increased cognitive challenge with task. Pt required VC frequently for task attention, matching, and midline stance., grossly CGA. Pt directed in category sorting in standing with reaching outside BOS to complete task x20 objects, CGA. Pt directed in gait training, no AD CGA with VC for increased cadence, increased step length, trunk extension and safety awareness with turns, 150'. Pt directed in 2x5 STS from EOB, CGA with VC for tech and safety. Pt directedin seek and finds x3 in room with x3 objects at CGA and max VC. Pt directed in sit>supine at end of session  supervision. Pt left in bed, alarm set, All needs in reach and in good condition. Call light in hand.    Therapy Documentation Precautions:  Precautions Precautions: Fall Precaution Comments: R inattention, R HB weakness (RUE>RLE), expressive > receptive aphasia Restrictions Weight Bearing Restrictions: No    Vital Signs: Therapy Vitals Temp: 97.9 F (36.6 C) Pulse Rate: 72 Resp: 18 BP: (!) 126/49 Patient Position (if appropriate): Sitting Oxygen Therapy SpO2: 100 % O2 Device: Room Air Pain: Pain Assessment Pain Score: 0-No pain    Therapy/Group: Individual Therapy  Barbaraann Faster 07/17/2020, 4:14 PM

## 2020-07-17 NOTE — Progress Notes (Signed)
Physical Therapy Session Note  Patient Details  Name: Sharon Gilbert MRN: [id number] Date of Birth: May 13, 1941  Today's Date: 07/17/2020 PT Individual Time: 0900-0957 PT Individual Time Calculation (min): 57 min   Short Term Goals: Week 1:  PT Short Term Goal 1 (Week 1): Pt will perform bed mobility with CGA and no bed features PT Short Term Goal 2 (Week 1): Pt will perform bed<>chair transfers with LRAD and CGA PT Short Term Goal 3 (Week 1): Pt will ambulate at least 128ft with CGA and LRAD PT Short Term Goal 4 (Week 1): Pt will navigate up/down at least x4 steps with CGA and LRAD  Skilled Therapeutic Interventions/Progress Updates:    Pt received sitting in recliner, agreeable to PT session, denies any pain. She appears to recognize PT, unable to verbalize intelligible name. When asked if she goes by "[name]" she nods head yes repeatedly and and appears to say she likes "[name]" more than "[place]." She responds best to yes/no questions and use of gestures due to her expressive aphasia. WC transport from her room to ortho gym for energy conservation. Performed standing on blue air-ex to complete Dynavision with bias towards R side of board to promote R attention. She completed 4 minutes of Dynavision with mostly CGA (occasional minA), scoring an average reaction time of 2.40 seconds and had good ability to locate lights on R side. After seated rest break, she then ambulated ~167ft with mostly CGA (occasional minA with turns) and no AD on level surfaces, demo's decreased gait speed, slow cadence, decreased R hip/knee flexion. Performed sit<>stand transfer with CGA from sofa couch in ADL room and sit<>supine with CGA in regular flat bed. Instructed her to locate 2 items in ADL kitchen, "jello and measuring cup." She was able to locate both items with mod cueing. After seated rest break, she then performed unsupported step-ups on 4inch block with R foot leading for purposes of strengthening and  challenging balance. She completed this with minA and demonstrated good ability to self correct LOB's during transitions. WC transport back to her room for time management, stand<>pivot transfer with CGA from w/c to recliner. Ended session in recliner with BLE elevated, belt alarm on, needs in reach, made comfortable.   Therapy Documentation Precautions:  Precautions Precautions: Fall Precaution Comments: R inattention, R HB weakness (RUE>RLE), expressive > receptive aphasia Restrictions Weight Bearing Restrictions: No  Therapy/Group: Individual Therapy  Benjamyn Hestand P Chelsae Zanella PT 07/17/2020, 7:49 AM

## 2020-07-17 NOTE — Progress Notes (Signed)
Occupational Therapy Session Note  Patient Details  Name: Sharon Gilbert MRN: [id number] Date of Birth: 26-Apr-1941  Today's Date: 07/17/2020 OT Individual Time: 5436-0677 OT Individual Time Calculation (min): 45 min    Short Term Goals: Week 1:  OT Short Term Goal 1 (Week 1): Pt will complete UB dressing with supervision OT Short Term Goal 2 (Week 1): Pt will complete bathing with CGA. OT Short Term Goal 3 (Week 1): Pt will complete LB dressing with supervision. OT Short Term Goal 4 (Week 1): Pt will complete toilet transfer with supervision.  Skilled Therapeutic Interventions/Progress Updates:  Patient met lying supine in bed in agreement with OT treatment session. 0/10 pain at rest and with activity. Supine to EOB with supervision A. Patient demonstrates CGA with all functional transfers this date requiring assistance to maintain position of RUE on RW. Patient completed oral hygiene and face washing standing at sink level with hand over hand assist for incorporation of RUE at a gross assist level. NMR with use of BUE arm ergometer and ace bandage to maintain position of RUE on pedal 2/2 weak grasp. Facilitation of shoulder musculature with manually throughout. Session concluded with patient seated in recliner with call bell within reach and all needs met.   Therapy Documentation Precautions:  Precautions Precautions: Fall Precaution Comments: R inattention, R HB weakness (RUE>RLE), expressive > receptive aphasia Restrictions Weight Bearing Restrictions: No General:    Therapy/Group: Individual Therapy  Ivyanna Sibert R [place]Davis 07/17/2020, 8:14 AM

## 2020-07-17 NOTE — Progress Notes (Signed)
Speech Language Pathology Daily Session Note  Patient Details  Name: Sharon Gilbert MRN: [id number] Date of Birth: October 26, 1940  Today's Date: 07/17/2020 SLP Individual Time: 1301-1330 SLP Individual Time Calculation (min): 29 min  Short Term Goals: Week 1: SLP Short Term Goal 1 (Week 1): Patient will identify object/noun pictures in field of 6 with 85% accuracy. SLP Short Term Goal 2 (Week 1): Patient will point to verb/action pictures in field of 3-4 with 80% accuracy. SLP Short Term Goal 3 (Week 1): Patient will imitate at word level with maxA cues for 70% accuracy. SLP Short Term Goal 4 (Week 1): Patient will use task-specific communication board during structured tasks with minA SLP Short Term Goal 5 (Week 1): Patient will tolerate trials of Dys 3 solids with trace to minimal oral residuals and minA to clear.  Skilled Therapeutic Interventions:Skilled ST services focused on language skills. SLP facilitated matching word with photographs of objects in a field of 4 in 4 out 5 opportunities and identifying actions in photographs in a field of 5 with max A semantic cues (ex: look. look at self.) SLP also facilitated automatic language in counting 1-10, pt demonstrated ability to count in unison improving from 0/10 to 4/10 accuracy noting phonemic errors. Pt demonstrated little awareness of phonetic errors, but demonstrated improvement in imitating words/numbers when she was able to see SLP's mouth. Pt required mod A verbal cues to express wants/needs in response to yes/no question. Pt demonstrated recalled of call bell when prompted at the end of the session, where as in the beginning of the session required max A multimodal cues.Pt was left in room with call bell within reach and chair alarm set. SLP recommends to continue skilled services.     Pain Pain Assessment Pain Score: 0-No pain  Therapy/Group: Individual Therapy  Sisto Granillo  [facility] 07/17/2020, 3:52 PM

## 2020-07-18 ENCOUNTER — Inpatient Hospital Stay (HOSPITAL_COMMUNITY): Payer: Medicare HMO | Admitting: Occupational Therapy

## 2020-07-18 ENCOUNTER — Inpatient Hospital Stay (HOSPITAL_COMMUNITY): Payer: Medicare HMO

## 2020-07-18 ENCOUNTER — Inpatient Hospital Stay (HOSPITAL_COMMUNITY): Payer: Medicare HMO | Admitting: Speech Pathology

## 2020-07-18 LAB — GLUCOSE, CAPILLARY
Glucose-Capillary: 121 mg/dL — ABNORMAL HIGH (ref 70–99)
Glucose-Capillary: 96 mg/dL (ref 70–99)
Glucose-Capillary: 171 mg/dL — ABNORMAL HIGH (ref 70–99)
Glucose-Capillary: 102 mg/dL — ABNORMAL HIGH (ref 70–99)

## 2020-07-18 MED ORDER — INFLUENZA VAC A&B SA ADJ QUAD 0.5 ML IM PRSY
0.5000 mL | PREFILLED_SYRINGE | INTRAMUSCULAR | Status: AC
  Administered 2020-07-19: 0.5 mL via INTRAMUSCULAR
  Filled 2020-07-18: qty 0.5

## 2020-07-18 NOTE — Progress Notes (Signed)
Speech Language Pathology Daily Session Note  Patient Details  Name: Sharon Gilbert MRN: [id number] Date of Birth: 1940/11/01  Today's Date: 07/18/2020 SLP Individual Time: 1335-1420 SLP Individual Time Calculation (min): 45 min  Short Term Goals: Week 1: SLP Short Term Goal 1 (Week 1): Patient will identify object/noun pictures in field of 6 with 85% accuracy. SLP Short Term Goal 2 (Week 1): Patient will point to verb/action pictures in field of 3-4 with 80% accuracy. SLP Short Term Goal 3 (Week 1): Patient will imitate at word level with maxA cues for 70% accuracy. SLP Short Term Goal 4 (Week 1): Patient will use task-specific communication board during structured tasks with minA SLP Short Term Goal 5 (Week 1): Patient will tolerate trials of Dys 3 solids with trace to minimal oral residuals and minA to clear.  Skilled Therapeutic Interventions:   Patient seen for skilled ST session focusing on speech-language function. Patient was able to point to identify object drawings in field of four with 100% accuracy and in field of 6 with 80% accuracy. She was approximately 70% accurate for sequencing 3-picture photos, but when SLP corrected errors, she would seem to then understand her own mistake, "oh, yeah!", etc. Patient was able to imitate at phoneme level with SLP removing mask so she could see lips, for "eee", "ooo" "ah" , /m/, /n/, /d/ with maximal A and repetition. She isn't able to consistently perform secondary to oral motor apraxia of speech. Patient did spontaneously name a few pictures, with phonemic errors, "dush" (brush), etc. Patient continues to benefit from skilled SLP intervention to maximize speech-language and swallow function prior to discharge.   Pain Pain Assessment Pain Scale: 0-10 Pain Score: 0-No pain  Therapy/Group: Individual Therapy   Angela Nevin, MA, CCC-SLP Speech Therapy

## 2020-07-18 NOTE — Progress Notes (Signed)
Occupational Therapy Session Note  Patient Details  Name: Sharon Gilbert MRN: [id number] Date of Birth: August 13, 1941  Today's Date: 07/18/2020 OT Individual Time: 1130-1200 OT Individual Time Calculation (min): 30 min    Short Term Goals: Week 1:  OT Short Term Goal 1 (Week 1): Pt will complete UB dressing with supervision OT Short Term Goal 2 (Week 1): Pt will complete bathing with CGA. OT Short Term Goal 3 (Week 1): Pt will complete LB dressing with supervision. OT Short Term Goal 4 (Week 1): Pt will complete toilet transfer with supervision.  Skilled Therapeutic Interventions/Progress Updates:    Pt sitting up in w/c, agreeable to OT session with no c/o pain.  Pt transported to gym for tabletop right forearm, wrist, and hand strengthening.  Pt completed the following therex requiring max TCs and visual cues for sequencing and body mechanics:  Forearm pro/sup holding end of 1 lb free weight, wrist extension against gravity, digital flexion/extension, gross grip yellow putty and functional grasp/release of cups.  Pt demonstrated 3/5 wrist ext strength.  Educated pt on neutral active positioning using visual demonstration due to pt observed neglecting and letting hand hand with wrist in flexed posture.  Pt returned to room, SPT w/c to recliner with CGA, call bell in reach, seat belt alarm on.  Therapy Documentation Precautions:  Precautions Precautions: Fall Precaution Comments: R inattention, R HB weakness (RUE>RLE), expressive > receptive aphasia Restrictions Weight Bearing Restrictions: No   Therapy/Group: Individual Therapy  Amie Critchley 07/18/2020, 4:06 PM

## 2020-07-18 NOTE — Progress Notes (Signed)
Patient ID: Sharon Gilbert, female   DOB: [birth year], 79 y.o.   MRN: [id number] Spoke with son-LC via telephone to inform of team conference goals supervision level and target discharge 10/21. He was sying she needs to stay to be able to go home alone. When asked about the [facility] talking with him prior to admission and he telling her sister would stay with. He told worker to contact her sister and to discuss and also ask her to bring pt's clothes to the hospital. Have left a message for her sister-Doris await return call. Pt is aware of all of this and just shakes her head when mentions son and his concerns.

## 2020-07-18 NOTE — Progress Notes (Signed)
Occupational Therapy Session Note  Patient Details  Name: Sharon Gilbert MRN: [id number] Date of Birth: [birth year]  Today's Date: 07/18/2020 OT Individual Time: 1000-1045 OT Individual Time Calculation (min): 45 min    Short Term Goals: Week 1:  OT Short Term Goal 1 (Week 1): Pt will complete UB dressing with supervision OT Short Term Goal 2 (Week 1): Pt will complete bathing with CGA. OT Short Term Goal 3 (Week 1): Pt will complete LB dressing with supervision. OT Short Term Goal 4 (Week 1): Pt will complete toilet transfer with supervision.  Skilled Therapeutic Interventions/Progress Updates:    Patient seated in recliner, pleasant and cooperative, able to follow directions with occ gesture cues.  Sit to stand and short distance ambulation without AD to/from recliner, w/c, toilet with CGA.  She completes oral care with set up seated at sink.  UB bathing min A, donns [place] shirt with min A.  occ inappropriate use of objects (attempted to use lotion for underarms instead of deodorant) and cues for sequencing of tasks.  toileting min a.  LB bathing min A, LB dressing min A.   Completed right UE AROM - facilitation of grasp and reach/placement of objects with facilitation of wrist extensors.  She returned to recliner at close of session. Seat belt alarm set and call bell in reach.    Therapy Documentation Precautions:  Precautions Precautions: Fall Precaution Comments: R inattention, R HB weakness (RUE>RLE), expressive > receptive aphasia Restrictions Weight Bearing Restrictions: No  Therapy/Group: Individual Therapy  Barrie Lyme 07/18/2020, 7:34 AM

## 2020-07-18 NOTE — Patient Care Conference (Signed)
Inpatient RehabilitationTeam Conference and Plan of Care Update Date: 07/18/2020   Time: 11:08 AM   Patient Name: Sharon Gilbert      Medical Record Number: [id number]  Date of Birth: [birth year] Sex: Female         Room/Bed: 4M12C/4M12C-01 Payor Info: Payor: [employer] MEDICARE / Plan: [employer] MEDICARE HMO/PPO / Product Type: *No Product type* /    Admit Date/Time:  07/11/2020  3:45 PM  Primary Diagnosis:  Acute ischemic left ICA stroke [facility])  Hospital Problems: Principal Problem:   Acute ischemic left ICA stroke (HCC) d/t LAA R ICA Active Problems:   Acute ischemic left MCA stroke (HCC)   Dysphagia, post-stroke   Acute blood loss anemia   Hypokalemia   Controlled type 2 diabetes mellitus with complication, without long-term current use of insulin (HCC)   Stenosis of right internal carotid artery   Transaminitis   History of hypertension   Drug-induced hypotension   Labile blood glucose   Slow transit constipation    Expected Discharge Date: Expected Discharge Date: 07/26/20  Team Members Present: Physician leading conference: Dr. Maryla Morrow Care Coodinator Present: Chana Bode, RN, BSN, CRRN;Other (comment) Kriste Basque Dupree, SW) Nurse Present: Other (comment) Eulah Citizen, RN) PT Present: Other (comment) (Christain Manhard,PT) OT Present: Dolphus Jenny, OT SLP Present: Suzzette Righter, CF-SLP PPS Coordinator present : Edson Snowball, Park Breed, SLP     Current Status/Progress Goal Weekly Team Focus  Bowel/Bladder   cont/incontinent of B&B; LBM 10/10  train B&B; regain complete continence  assess q shift/prn   Swallow/Nutrition/ Hydration   dys 2 textures and thin liquids, mod I  Mod I  swallow strategies and trials of dys 3 textures   ADL's   CGA functional transfers, min assist UB ADLs, modA LB ADLs, right wrist and digit extension trace movement achieved  mod I/supervision  functional transfer training, self care/compensatory technique training, right wrist and hand  strengthening/NMR   Mobility   CGA bed mobility, CGA transfers, minA gait with no AD up to ~113ft, stairs minA  mod I / supervision  dynamic standing balance, R attention, gait with LRAD   Communication   increas receptive verse expressive language, wants/needs via response to yes/no/gestures min-mod A, error awareness Max A, word level expression max A  Mod-Min A  matching pictures by name/action to form a communication book, verbalization/naming at word level, expressing wants/needs   Safety/Cognition/ Behavioral Observations  Max-Mod A  Supervision A  basic problem solving   Pain   no c/o pain  remain pain free  assess q shift/prn   Skin   skin intact  remain free of infection/breakdown  assess q shift/prn     Discharge Planning:  Home with sister providing supervision level can not provide anymore assist than this. Will need to get sister in for education prior to DC home   Team Discussion: Cognitive issues and lack of clothes impairs progress. Currently with D2 thin diet hopeful to advance. Receptive language and work level expression issues. Anticipate will need help with communication of needs. Needs reminders to attend to the right side and gait is functional. Patient on target to meet rehab goals: yes  *See Care Plan and progress notes for long and short-term goals.   Revisions to Treatment Plan:  SLP looking to advance diet to D3 level Teaching Needs: Transfers, toileting, medications, etc.  Current Barriers to Discharge: Decreased family support  Possible Resolutions to Barriers: Family education Request family bring in clothes  Medical Summary Current Status: Global aphasia, right hemiparesis with impaired mobility, dysphagia, and ADLs secondary to acute ischemic left ICA stroke  Barriers to Discharge: Behavior;Medical stability   Possible Resolutions to [employer] Focus: Therapies, monitor labs - K+, advance diet as tolerated, optimize bowel meds,  optimize BP   Continued Need for Acute Rehabilitation Level of Care: The patient requires daily medical management by a physician with specialized training in physical medicine and rehabilitation for the following reasons: Direction of a multidisciplinary physical rehabilitation program to maximize functional independence : Yes Medical management of patient stability for increased activity during participation in an intensive rehabilitation regime.: Yes Analysis of laboratory values and/or radiology reports with any subsequent need for medication adjustment and/or medical intervention. : Yes   I attest that I was present, lead the team conference, and concur with the assessment and plan of the team.   Chana Bode B 07/18/2020, 12:50 PM

## 2020-07-18 NOTE — Progress Notes (Signed)
[place] PHYSICAL MEDICINE & REHABILITATION PROGRESS NOTE  Subjective/Complaints: Patient seen sitting up, working with therapy this morning.  Good sitting balance noted.  She has slight improvement in spontaneous speech, but remains extremely limited.  She is trying to communicate something, but incomprehensible.  ROS: limited due to aphasia  Objective: Vital Signs: Blood pressure (!) 119/59, pulse 85, temperature 98.6 F (37 C), temperature source Oral, resp. rate 17, height 5\' 5"  (1.651 m), weight 73.8 kg, SpO2 100 %. No results found. Recent Labs    07/16/20 0653  WBC 7.4  HGB 10.6*  HCT 33.0*  PLT 427*   Recent Labs    07/16/20 0653  NA 139  K 3.8  CL 105  CO2 22  GLUCOSE 113*  BUN 18  CREATININE 0.84  CALCIUM 9.4    Intake/Output Summary (Last 24 hours) at 07/18/2020 0925 Last data filed at 07/18/2020 0820 Gross per 24 hour  Intake 320 ml  Output --  Net 320 ml        Physical Exam: BP (!) 119/59 (BP Location: Left Arm)   Pulse 85   Temp 98.6 F (37 C) (Oral)   Resp 17   Ht 5\' 5"  (1.651 m)   Wt 73.8 kg   SpO2 100%   BMI 27.07 kg/m  Constitutional: No distress . Vital signs reviewed. HENT: Normocephalic.  Atraumatic. Eyes: EOMI. No discharge. Cardiovascular: No JVD.  RRR. Respiratory: Normal effort.  No stridor.  Bilateral clear to auscultation. GI: Non-distended.  BS +. Skin: Warm and dry.  Intact. Psych: Normal mood.  Normal behavior. Musc: No edema in extremities.  No tenderness in extremities. Neuro: Alert Expressive>> receptive aphasia, slight improvement Right facial weakness.  Motor: ?RUE: Shoulder abduction, elbow flexion/extension 4/5, wrist extension 2/5, distally 0/5 RLE: 4+/5 proximal to distal  Assessment/Plan: 1. Functional deficits secondary to acute ischemic left ICA stroke which require 3+ hours per day of interdisciplinary therapy in a comprehensive inpatient rehab setting.  Physiatrist is providing close team  supervision and 24 hour management of active medical problems listed below.  Physiatrist and rehab team continue to assess barriers to discharge/monitor patient progress toward functional and medical goals   Care Tool:  Bathing    Body parts bathed by patient: Right arm, Chest, Abdomen, Front perineal area, Buttocks, Right upper leg, Left upper leg, Face, Right lower leg, Left lower leg   Body parts bathed by helper: Left arm     Bathing assist Assist Level: Minimal Assistance - Patient > 75%     Upper Body Dressing/Undressing Upper body dressing   What is the patient wearing?: Pull over shirt    Upper body assist Assist Level: Minimal Assistance - Patient > 75%    Lower Body Dressing/Undressing Lower body dressing      What is the patient wearing?: Pants, Incontinence brief     Lower body assist Assist for lower body dressing: Moderate Assistance - Patient 50 - 74%     Toileting Toileting    Toileting assist Assist for toileting: Moderate Assistance - Patient 50 - 74%     Transfers Chair/bed transfer  Transfers assist     Chair/bed transfer assist level: Minimal Assistance - Patient > 75%     Locomotion Ambulation   Ambulation assist      Assist level: Minimal Assistance - Patient > 75% Assistive device: No Device Max distance: 50   Walk 10 feet activity   Assist     Assist level: Minimal Assistance - Patient >  75% Assistive device: No Device   Walk 50 feet activity   Assist    Assist level: Minimal Assistance - Patient > 75% Assistive device: No Device    Walk 150 feet activity   Assist Walk 150 feet activity did not occur: Safety/medical concerns (fatigue)         Walk 10 feet on uneven surface  activity   Assist Walk 10 feet on uneven surfaces activity did not occur: Safety/medical concerns         Wheelchair     Assist Will patient use wheelchair at discharge?: No             Wheelchair 50 feet with 2  turns activity    Assist            Wheelchair 150 feet activity     Assist          Medical Problem List and Plan: 1.    Global aphasia, right hemiparesis with impaired mobility and ADLs secondary to acute ischemic left ICA stroke  Continue CIR  Team conference today to discuss current and goals and coordination of care, home and environmental barriers, and discharge planning with nursing, case manager, and therapies. Please see conference note from today as well.   WHO nightly 2.  L-ICA stent/Antithrombotics: -DVT/anticoagulation:  Pharmaceutical: Lovenox 40mg  q24H.              -antiplatelet therapy: On ASA/Brilinta 3. Pain Management: N/A 4. Mood: LCSW to follow for evaluation and support.              -antipsychotic agents: N/A 5. Neuropsych: This patient is not fully capable of making decisions on her own behalf. 6. Skin/Wound Care: Routine pressure relief measures.  7. Fluids/Electrolytes/Nutrition: Monitor I/Os.  8. HTN: Monitor BP, high graded R-ICA stenosis. Per neurology SBP goals 130-150 range.   Lisinopril decreased on 10/11  HCTZ daily DC'd on 10/9  Relatively controlled on 10/13  Monitor with increased mobility 9. T2DM- diet controlled?: Hgb A1C-6.1. Will monitor BS ac/hs and use SSI for elevated BS as indicated.   Slightly elevated on 10/13  Monitor with increased mobility 10. Dyslipidemia: On Zocor 80 mg.  11. Intermittent Hypokalemia:   Potassium 3.8 on 10/11  Continue to monitor 12. ABLA:   Hemoglobin 10.6 on 10/11  Continue to monitor 13. Glaucoma: Stable on Xalatan and Cosopt.   14. Overweight (BMI 29.53): provide dietary counseling 15.  Post stroke dysphagia  D2 thins, advance diet as tolerated 16.  Mild transaminitis: Resolved  ALT within normal limits on 9/11 17.  Slow transit constipation  Bowel meds scheduled on 10/12, will consider further medications if no improvement today.  LOS: 7 days A FACE TO FACE EVALUATION WAS  PERFORMED  Mccartney Brucks 12/12 07/18/2020, 9:25 AM

## 2020-07-18 NOTE — Progress Notes (Signed)
Physical Therapy Session Note  Patient Details  Name: Sharon Gilbert MRN: [id number] Date of Birth: 1941-06-09  Today's Date: 07/18/2020 PT Individual Time: 0800-0900 + 1500 -1528 PT Individual Time Calculation (min): 60 min + 28 min  Short Term Goals: Week 1:  PT Short Term Goal 1 (Week 1): Pt will perform bed mobility with CGA and no bed features PT Short Term Goal 2 (Week 1): Pt will perform bed<>chair transfers with LRAD and CGA PT Short Term Goal 3 (Week 1): Pt will ambulate at least 155ft with CGA and LRAD PT Short Term Goal 4 (Week 1): Pt will navigate up/down at least x4 steps with CGA and LRAD  Skilled Therapeutic Interventions/Progress Updates:   1st session:   Pt received supine in bed, agreeable to PT session; appears to deny any pain. Supine<>sit with CGA with use of bed features. Doffed socks with minA while seated EOB using figure-4 technique, donned shoes with minA as well. Pt appearing to request to brush her teeth and wash her face using hand gestures, expressive aphasia limiting. Therefore, ambulated with CGA/minA from EOB to bathroom sink with no AD. She was able to maintain standing balance with CGA while performing self care tasks. She brushed her teeth with setupA using her LUE and also washed her face with LUE. Due to possible poor bucal/orofacial weakness, water leaking from mouth during oral care. Stand>sit with CGA to w/c and clean scrub t-shirt provided. She required minA for doffing t-shirt and modA for donning t-shirt, while seated in w/c. WC transport for time management from her room to dayroom gym. Performed unsupported dynamic standing balance with minA and with toe taps on soft cones, increased difficulty with RLE coordination/control but this improved with repetition. She then performed unsupported standing balance while tasked with instructions to complete puzzle with ping-pong balls, matching the picture provided. She was able to complete this with CGA for  standing balance, >8 minutes, but required min/mod cues for correctly matching the picture, appearing receptive to feedback. She then ambulated 358ft with CGA and no AD, ambulating around nurses station twice, focusing on R turns, R attention, managing environmental barriers, and increasing cadence. She then performed gait training with obstacle course, requiring CGA for stability. Obstacle course included stepping over x8 objects and weaving in/out x4 cones that were spaced ~18ft apart. She demonstrated appropriate ability to weave through cones but had required a few reminders for sequencing of course completion. WC transport back to her room for time management, stand<>pivot transfer with CGA from w/c to recliner. She remained seated in recliner with BLE elevated, belt alarm on, needs in reach.   2nd session: Pt received sitting in recliner, agreeable to PT session, she appears to deny any pain or discomfort. Sit<>Stand with CGA with no AD from recliner. She ambulated from her room to dayroom gym (>146ft) with CGA and no AD. Room was occupied, therefore, focused remainder of session on endurance/gait training. Attempted to perform FGA/DGI with her, however her receptive aphasia was limiting ability. Therefore, performed various parts of DGI. She had a x1 LOB while doing a quick 180deg turn to the R requiring min/modA for recovery. Her gait speed was 0.4 m/s, indicative of household ambulator. She demo's narrow BOS and mild R inattention with environmental obstacles. She ambulated >267ft consecutively during our session with CGA/minA and no AD. She then performed Nustep x6 minutes at workload of 5, using BLE's only, focusing on B hip/knee extension and B coordination. She then ambulated back to  her room with CGA and no AD, stand>sit with CGA, sit>supine with supervision with HOB flat. She remained semi-reclined in bed with needs in reach, bed alarm on, 3/4 rails up, she was made comfortable.  Therapy  Documentation Precautions:  Precautions Precautions: Fall Precaution Comments: R inattention, R HB weakness (RUE>RLE), expressive > receptive aphasia Restrictions Weight Bearing Restrictions: No  Therapy/Group: Individual Therapy  Kaivon Livesey P Fernandez Kenley PT 07/18/2020, 8:35 AM

## 2020-07-19 ENCOUNTER — Inpatient Hospital Stay (HOSPITAL_COMMUNITY): Payer: Medicare HMO | Admitting: Occupational Therapy

## 2020-07-19 ENCOUNTER — Inpatient Hospital Stay (HOSPITAL_COMMUNITY): Payer: Medicare HMO

## 2020-07-19 ENCOUNTER — Inpatient Hospital Stay (HOSPITAL_COMMUNITY): Payer: Medicare HMO | Admitting: Speech Pathology

## 2020-07-19 LAB — GLUCOSE, CAPILLARY
Glucose-Capillary: 115 mg/dL — ABNORMAL HIGH (ref 70–99)
Glucose-Capillary: 116 mg/dL — ABNORMAL HIGH (ref 70–99)
Glucose-Capillary: 104 mg/dL — ABNORMAL HIGH (ref 70–99)
Glucose-Capillary: 122 mg/dL — ABNORMAL HIGH (ref 70–99)

## 2020-07-19 NOTE — Progress Notes (Addendum)
Patient ID: Sharon Gilbert, female   DOB: Feb 07, 1941, 79 y.o.   MRN: [id number]  Spoke with Doris-sister via telephone to discuss pt's care. She reports pt's son-LC is not dependable and would leave pt alone at home. He does live with her although he tells people he doesn't. Doris can have pt go to her home and she will be there with her. Discussed needs she reports her beds are too high for pt so would need a hospital bed. Will work on follow up due to has [employer] which is a difficult one. Asked her to bring in pt's clothes if she is coming to visit. She will bring her something to wear.

## 2020-07-19 NOTE — Progress Notes (Signed)
Occupational Therapy Session Note  Patient Details  Name: Sharon Gilbert MRN: [id number] Date of Birth: 1940/10/10  Today's Date: 07/19/2020 OT Individual Time: 1425-1535 OT Individual Time Calculation (min): 70 min    Short Term Goals: Week 1:  OT Short Term Goal 1 (Week 1): Pt will complete UB dressing with supervision OT Short Term Goal 2 (Week 1): Pt will complete bathing with CGA. OT Short Term Goal 3 (Week 1): Pt will complete LB dressing with supervision. OT Short Term Goal 4 (Week 1): Pt will complete toilet transfer with supervision.  Skilled Therapeutic Interventions/Progress Updates:    Pt sitting up in recliner, aphasic but showing no signs of pain or distress.  Pt stating yes when asked if needing to go to bathroom.  Pt ambulated to bathroom and toilet transfer with CGA.  Pt completed toileting tasks with supervision and increased time.  Pt ambulated to w/c and transported to gym tabletop.  Pt participated in right wrist strengthening using 1lb free weight and hand secured to weight with coban.  Pt completed wrist extension and forearm pro/sup 3 x 15 reps.  Pt participated in functional grasp task to grasp and stack plastic cups needing mod assist to open digits, and visual cues to facilitate function forearm and wrist positioning with fair intermittent carryover.  Pt able to grasp with digital flex and stack without assist.  Attended NMES to wrist and digit extensors with MCP isolations and composite extension each with manual assist to position arm for maximal isolation.  Pt transported to room requesting back to bed.  SPT w/c to EOB and doffed shoes with supervision.  Sit to supine with independence.  Call bell in reach, bed alarm on.  Therapy Documentation Precautions:  Precautions Precautions: Fall Precaution Comments: R inattention, R HB weakness (RUE>RLE), expressive > receptive aphasia Restrictions Weight Bearing Restrictions: No   Therapy/Group: Individual  Therapy  Amie Critchley 07/19/2020, 4:25 PM

## 2020-07-19 NOTE — Progress Notes (Signed)
Physical Therapy Weekly Progress Note  Patient Details  Name: Sharon Gilbert MRN: [id number] Date of Birth: [birth year]  Beginning of progress report period: July 12, 2020 End of progress report period: July 19, 2020  Today's Date: 07/19/2020 PT Individual Time: 1015-1110 PT Individual Time Calculation (min): 55 min   Patient has met 4 of 4 short term goals. Pt is making appropriate progress towards LTG during her rehabilitation. She is now CGA for bed mobility, CGA for stand<>pivot transfers, has ambulated >268ft with CGA and no AD, and can perform stairs with CGA and U HR support. She has also shown improvement in R inattention, dynamic standing balance, and compensatory strategies for R HB weakness. She continues to be primarily limited by significant expressive > receptive aphasia, decreased safety awareness, balance deficits, and decreased RUE > RLE weakness. She also has difficulty expressing wants/needs verbally due to aphasia.  Patient continues to demonstrate the following deficits muscle weakness, impaired timing and sequencing, unbalanced muscle activation, decreased coordination and decreased motor planning and decreased problem solving and decreased safety awareness and therefore will continue to benefit from skilled PT intervention to increase functional independence with mobility.  Patient progressing toward long term goals. Continue plan of care.  PT Short Term Goals Week 1:  PT Short Term Goal 1 (Week 1): Pt will perform bed mobility with CGA and no bed features PT Short Term Goal 2 (Week 1): Pt will perform bed<>chair transfers with LRAD and CGA PT Short Term Goal 3 (Week 1): Pt will ambulate at least 13ft with CGA and LRAD PT Short Term Goal 4 (Week 1): Pt will navigate up/down at least x4 steps with CGA and LRAD Week 2:  PT Short Term Goal 1 (Week 2): STG = LTG due to ELOS  Skilled Therapeutic Interventions/Progress Updates:    Pt received sitting in recliner,  agreeable to PT session, denies any pain. Upon arrival, she points to a bag full of clothes that were at the bedside, appearing to request to change out of scrub clothes to her regular clothing. Prior to doing this, she also pointed to bathroom, appearing to request to toilet. Therefore, she sit<>stand with CGA from recliner, ambulated ~75ft with CGA and no AD to bathroom, modA for doffing briefs and pants while standing, stand>sit with CGA for safety with cues for controlled lowering. Pt was continent of bladder, charted. Sit<>stand with CGA from low toilet height with no AD, provided extra soaped wash clothes for pericare as noted dirty brief. Required maxA for donning new brief. Ambulated from bathroom to EOB with CGA. Performed lower body dressing with modA including pants, socks, and shoes. Also required min/modA for upper body dressing including shirt and bra. Pt appears to be happy with her regular clothes, as she sighs a relief once complete with dressing. Remainder of session to focus on gait training in community setting. Therefore, WC transport for time management from her room to outdoors near fountain. She ambulated 2x438ft with CGA and no AD outdoors, ambulating on mild unlevel paved surfaces. Cues provided for normalizing gait pattern, increasing cadence, and visual scanning to R side. Gait training included weaving in/out of environmental barriers and frequent quick turns to the R to challenge dynamic standing balance. Pt with a few minor LOB to the L during gait training that were self corrected. WC transport back to her room for time management, stand<>pivot with CGA from w/c to recliner. She ended session seated in recliner with BLE elevated, needs in reach, belt alarm  on.  Therapy Documentation Precautions:  Precautions Precautions: Fall Precaution Comments: R inattention, R HB weakness (RUE>RLE), expressive > receptive aphasia Restrictions Weight Bearing Restrictions:  No  Therapy/Group: Individual Therapy  Ronny Ruddell P Ruel Dimmick PT 07/19/2020, 12:07 PM

## 2020-07-19 NOTE — Progress Notes (Signed)
Speech Language Pathology Daily Session Note  Patient Details  Name: Sharon Gilbert MRN: [id number] Date of Birth: 05-05-1941  Today's Date: 07/19/2020 SLP Individual Time: 3662-9476 SLP Individual Time Calculation (min): 45 min  Short Term Goals: Week 1: SLP Short Term Goal 1 (Week 1): Patient will identify object/noun pictures in field of 6 with 85% accuracy. SLP Short Term Goal 2 (Week 1): Patient will point to verb/action pictures in field of 3-4 with 80% accuracy. SLP Short Term Goal 3 (Week 1): Patient will imitate at word level with maxA cues for 70% accuracy. SLP Short Term Goal 4 (Week 1): Patient will use task-specific communication board during structured tasks with minA SLP Short Term Goal 5 (Week 1): Patient will tolerate trials of Dys 3 solids with trace to minimal oral residuals and minA to clear.  Skilled Therapeutic Interventions:   Patient seen for skilled ST session focusing on speech-language goals. Patient was able to point to printed words of objects and familiar names in field of 6 with 90% accuracy and no significant delay. She was able to imitate SLP to produce vowel phonemes (oh, ooo, eee, ah), bilabial phonemes /p/ and /b/ and lingual phonemes /t/ and /d/ with modA cues for accuracy and consistency. Patient continues to benefit from skilled SLP intervention to maximize speech-language and swallow function prior to discharge.   Pain Pain Assessment Pain Scale: 0-10 Pain Score: 0-No pain  Therapy/Group: Individual Therapy   Angela Nevin, MA, CCC-SLP Speech Therapy

## 2020-07-19 NOTE — Progress Notes (Signed)
[facility] PHYSICAL MEDICINE & REHABILITATION PROGRESS NOTE  Subjective/Complaints: Patient seen laying in bed this morning.  No reported issues overnight.  ROS: limited due to aphasia  Objective: Vital Signs: Blood pressure (!) 140/54, pulse 79, temperature 98.2 F (36.8 C), resp. rate 16, height 5\' 5"  (1.651 m), weight 74.1 kg, SpO2 99 %. No results found. No results for input(s): WBC, HGB, HCT, PLT in the last 72 hours. No results for input(s): NA, K, CL, CO2, GLUCOSE, BUN, CREATININE, CALCIUM in the last 72 hours.  Intake/Output Summary (Last 24 hours) at 07/19/2020 1130 Last data filed at 07/19/2020 0758 Gross per 24 hour  Intake 290 ml  Output --  Net 290 ml        Physical Exam: BP (!) 140/54 (BP Location: Left Arm)   Pulse 79   Temp 98.2 F (36.8 C)   Resp 16   Ht 5\' 5"  (1.651 m)   Wt 74.1 kg   SpO2 99%   BMI 27.18 kg/m  Constitutional: No distress . Vital signs reviewed. HENT: Normocephalic.  Atraumatic. Eyes: EOMI. No discharge. Cardiovascular: No JVD.  RRR. Respiratory: Normal effort.  No stridor.  Bilateral clear to auscultation. GI: Non-distended.  BS +. Skin: Warm and dry.  Intact. Psych: Normal mood.  Normal behavior. Musc: No edema in extremities.  No tenderness in extremities. Neuro: Alert Expressive>receptive aphasia, slight improvement Right facial weakness.  Motor: ?RUE: Shoulder abduction, elbow flexion/extension 4/5, wrist extension 2/5, distally 0/5, appears stable RLE: 4+/5 proximal to distal  Assessment/Plan: 1. Functional deficits secondary to acute ischemic left ICA stroke which require 3+ hours per day of interdisciplinary therapy in a comprehensive inpatient rehab setting.  Physiatrist is providing close team supervision and 24 hour management of active medical problems listed below.  Physiatrist and rehab team continue to assess barriers to discharge/monitor patient progress toward functional and medical goals   Care  Tool:  Bathing    Body parts bathed by patient: Right arm, Chest, Abdomen, Front perineal area, Buttocks, Right upper leg, Left upper leg, Face, Right lower leg, Left lower leg   Body parts bathed by helper: Left arm     Bathing assist Assist Level: Minimal Assistance - Patient > 75%     Upper Body Dressing/Undressing Upper body dressing   What is the patient wearing?: Pull over shirt    Upper body assist Assist Level: Minimal Assistance - Patient > 75%    Lower Body Dressing/Undressing Lower body dressing      What is the patient wearing?: Pants, Incontinence brief     Lower body assist Assist for lower body dressing: Moderate Assistance - Patient 50 - 74%     Toileting Toileting    Toileting assist Assist for toileting: Moderate Assistance - Patient 50 - 74%     Transfers Chair/bed transfer  Transfers assist     Chair/bed transfer assist level: Contact Guard/Touching assist     Locomotion Ambulation   Ambulation assist      Assist level: Contact Guard/Touching assist Assistive device: No Device Max distance: 200'   Walk 10 feet activity   Assist     Assist level: Contact Guard/Touching assist Assistive device: No Device   Walk 50 feet activity   Assist    Assist level: Minimal Assistance - Patient > 75% Assistive device: No Device    Walk 150 feet activity   Assist Walk 150 feet activity did not occur: Safety/medical concerns (fatigue)  Assist level: Minimal Assistance - Patient > 75%  Assistive device: No Device    Walk 10 feet on uneven surface  activity   Assist Walk 10 feet on uneven surfaces activity did not occur: Safety/medical concerns         Wheelchair     Assist Will patient use wheelchair at discharge?: No             Wheelchair 50 feet with 2 turns activity    Assist            Wheelchair 150 feet activity     Assist          Medical Problem List and Plan: 1.    Global  aphasia, right hemiparesis with impaired mobility and ADLs secondary to acute ischemic left ICA stroke  Continue CIR  WHO nightly 2.  L-ICA stent/Antithrombotics: -DVT/anticoagulation:  Pharmaceutical: Lovenox 40mg  q24H.              -antiplatelet therapy: On ASA/Brilinta 3. Pain Management: N/A 4. Mood: LCSW to follow for evaluation and support.              -antipsychotic agents: N/A 5. Neuropsych: This patient is not fully capable of making decisions on her own behalf. 6. Skin/Wound Care: Routine pressure relief measures.  7. Fluids/Electrolytes/Nutrition: Monitor I/Os.  8. HTN: Monitor BP, high graded R-ICA stenosis. Per neurology SBP goals 130-150 range.   Lisinopril decreased on 10/11  HCTZ daily DC'd on 10/9  ?  Trending up on 10/14, will consider medication adjustments if necessary  Monitor with increased mobility 9. T2DM- diet controlled?: Hgb A1C-6.1. Will monitor BS ac/hs and use SSI for elevated BS as indicated.   Slightly elevated on 10/14  Will consider changes to carb modified diet when on regular textures-see #15  Monitor with increased mobility 10. Dyslipidemia: On Zocor 80 mg.  11. Intermittent Hypokalemia:   Potassium 3.8 on 10/11, labs ordered for tomorrow  Continue to monitor 12. ABLA:   Hemoglobin 10.6 on 10/11  Continue to monitor 13. Glaucoma: Stable on Xalatan and Cosopt.   14. Overweight (BMI 29.53): provide dietary counseling 15.  Post stroke dysphagia  D2 thins, advance diet as tolerated 16.  Mild transaminitis: Resolved  ALT within normal limits on 9/11 17.  Slow transit constipation  Bowel meds scheduled on 10/12  Medium sized BM yesterday, will consider additional medications if no BM today  LOS: 8 days A FACE TO FACE EVALUATION WAS PERFORMED  Gionna Polak 12/12 07/19/2020, 11:30 AM

## 2020-07-20 ENCOUNTER — Inpatient Hospital Stay (HOSPITAL_COMMUNITY): Payer: Medicare HMO | Admitting: Occupational Therapy

## 2020-07-20 ENCOUNTER — Inpatient Hospital Stay (HOSPITAL_COMMUNITY): Payer: Medicare HMO | Admitting: Speech Pathology

## 2020-07-20 ENCOUNTER — Inpatient Hospital Stay (HOSPITAL_COMMUNITY): Payer: Medicare HMO

## 2020-07-20 LAB — BASIC METABOLIC PANEL
Anion gap: 10 (ref 5–15)
BUN: 12 mg/dL (ref 8–23)
CO2: 22 mmol/L (ref 22–32)
Calcium: 8.9 mg/dL (ref 8.9–10.3)
Chloride: 104 mmol/L (ref 98–111)
Creatinine, Ser: 0.75 mg/dL (ref 0.44–1.00)
GFR, Estimated: >60 mL/min (ref >60)
Glucose, Bld: 112 mg/dL — ABNORMAL HIGH (ref 70–99)
Potassium: 3.5 mmol/L (ref 3.5–5.1)
Sodium: 136 mmol/L (ref 135–145)

## 2020-07-20 LAB — GLUCOSE, CAPILLARY
Glucose-Capillary: 113 mg/dL — ABNORMAL HIGH (ref 70–99)
Glucose-Capillary: 96 mg/dL (ref 70–99)
Glucose-Capillary: 105 mg/dL — ABNORMAL HIGH (ref 70–99)
Glucose-Capillary: 120 mg/dL — ABNORMAL HIGH (ref 70–99)

## 2020-07-20 MED ORDER — MAGNESIUM HYDROXIDE 400 MG/5ML PO SUSP
30.0000 mL | Freq: Once | ORAL | Status: AC
  Administered 2020-07-20: 30 mL via ORAL
  Filled 2020-07-20: qty 30

## 2020-07-20 MED ORDER — BISACODYL 10 MG RE SUPP: 10.0000 mg | Freq: Once | RECTAL | Status: DC

## 2020-07-20 NOTE — Progress Notes (Signed)
Physical Therapy Session Note  Patient Details  Name: Sharon Gilbert MRN: [id number] Date of Birth: 06/26/1941  Today's Date: 07/20/2020 PT Individual Time: 1100-1154 + 1300-1415 PT Individual Time Calculation (min): 54 min + 75 min  Short Term Goals: Week 2:  PT Short Term Goal 1 (Week 2): STG = LTG due to ELOS  Skilled Therapeutic Interventions/Progress Updates:     1st session: Pt received sitting in recliner, agreeable to PT session. Denies any pain. Pt pointing to bedroom sink, appearing to request to perform some self care tasks - expressive aphasia limiting. Sit<>stand with CGA from recliner height without AD. She ambulated with CGA and no AD to bedroom sink. She performed ~10 minutes of self care tasks sinkside, maintaining balance with CGA. Self care tasks included brushing her teeth, washing her face, and adjusting her hair. She primarily used LUE for completing this tasks but noted efforts for using RUE occasionally for things like turning off sink and reaching for items. Stand<>sit with CGA to w/c and WC transport for time management from her room to orthogym. Performed NMR dynamic standing balance on blue air-ex foam pad while playing large (life-size) connect-4 game. Game placed on table to promote thoracic extension with reaching and problem solving for completing the game correctly. She appeared to have good understanding of rules. She requiring CGA (occasional minA) for completing this for standing balance. After seated rest, she ambulated 315ft with CGA and no AD in hallways, focusing on R attention and asking her to read objects on R side during gait. Dual-tasking requiring minA during gait due to worsening unsteadiness. She then performed game of standing corn-hole, played in staggered stance to challenge balance. She required CGA guard for completing this and successfully was able to toss in 2/6 bags in the hole! She then ambulated with CGA to the board and picked up 4/6 bags,  requiring minA while picking up object from floor for steadying. Returned to her w/c with CGA, WC transport back to her room and stand<>pivot with CGA from w/c to recliner. She remained seated in recliner with belt alarm on, needs in reach. NT present for routine vitals.   2nd session: Pt received sitting in recliner, agreeable to PT session, no c/o pain. Sit<>stand with CGA from recliner and stand<>pivot with CGA to w/c. WC transport for time management from her room to main therapy gym. Performed standing balance on blue air-ex foam pad while completing Figure 1 puzzle with small PVC pipes. She required CGA guard while completing this but mod cues for locating correct pieces to replicate puzzle. Also, with each piece of the puzzle, she was instructed to verbalize the letter corresponding with the piece, such as 'B', 'E', and 'F'. She was unable to successfully pronounce letters with repetitions and slowed trials. She then ambulated 52ft with CGA from mat table to stairs where she was able to negotiate up/down x12 steps with CGA and L HR support, demo's reciprocal pattern during ascent and step-to sequencing during descent. Pt without knee buckling or LOB during trials but noted unsteadiness while performing 180deg turn while at top of steps. While ambulating back to the mat table from stairs, she had x1 LOB forward due to R toe drag, requiring minA for correction, cues for increasing R heel strike and paced activity. Focused remained of session on dynamic gait training. She ambulated ~7ft with CGA/minA while weaving in/out of cones that were placed 53ft apart. Instructed her to step over cones to improve R foot clearance,  but aphasia limiting as she would often circumduct around cones rather than stepping over. After seated rest break, played music by Jess Barters and she danced to this in standing with CGA. Dancing included lateral side stepping, "twirling" with 360deg turns, and various stepping patterns.  Pt appeared to enjoy this greatly. WC transport back to her room for time management, stand<>pivot transfer to recliner with CGA. Son, visiting from [place], present at bedside. Informed him of pt's current mobility status and progress during her rehab stay. Pt ended session seated in recliner with son at bedside, needs in reach.  Therapy Documentation Precautions:  Precautions Precautions: Fall Precaution Comments: R inattention, R HB weakness (RUE>RLE), expressive > receptive aphasia Restrictions Weight Bearing Restrictions: No  Therapy/Group: Individual Therapy  Keyundra Fant P Raea Magallon PT 07/20/2020, 11:18 AM

## 2020-07-20 NOTE — Progress Notes (Signed)
[facility] PHYSICAL MEDICINE & REHABILITATION PROGRESS NOTE  Subjective/Complaints: Patient seen ambulating with therapies this AM.  Her son is present and she attempts to introduce [place].  Later seen in her room.  She states she slept well overnight. Son without questions.   ROS: limited due to aphasia.  Objective: Vital Signs: Blood pressure (!) 130/47, pulse (!) 42, temperature 98.8 F (37.1 C), resp. rate 18, height 5\' 5"  (1.651 m), weight 74.1 kg, SpO2 98 %. No results found. No results for input(s): WBC, HGB, HCT, PLT in the last 72 hours. Recent Labs    07/20/20 0440  NA 136  K 3.5  CL 104  CO2 22  GLUCOSE 112*  BUN 12  CREATININE 0.75  CALCIUM 8.9    Intake/Output Summary (Last 24 hours) at 07/20/2020 1020 Last data filed at 07/20/2020 0825 Gross per 24 hour  Intake 440 ml  Output --  Net 440 ml        Physical Exam: BP (!) 130/47 (BP Location: Left Arm)    Pulse (!) 42    Temp 98.8 F (37.1 C)    Resp 18    Ht 5\' 5"  (1.651 m)    Wt 74.1 kg    SpO2 98%    BMI 27.18 kg/m  Constitutional: No distress . Vital signs reviewed. HENT: Normocephalic.  Atraumatic. Eyes: EOMI. No discharge. Cardiovascular: No JVD.  RRR. Respiratory: Normal effort.  No stridor.  Bilateral clear to auscultation. GI: Non-distended.  BS +. Skin: Warm and dry.  Intact. Psych: Normal mood.  Normal behavior. Musc: No edema in extremities.  No tenderness in extremities. Neuro: Alert Expressive>receptive aphasia, slightly improving Right facial weakness.  Motor: ?RUE: Shoulder abduction, elbow flexion/extension 4/5, wrist extension 2/5, distally 0/5, appears unchanged RLE: 4+/5 proximal to distal  Assessment/Plan: 1. Functional deficits secondary to acute ischemic left ICA stroke which require 3+ hours per day of interdisciplinary therapy in a comprehensive inpatient rehab setting.  Physiatrist is providing close team supervision and 24 hour management of active medical problems listed  below.  Physiatrist and rehab team continue to assess barriers to discharge/monitor patient progress toward functional and medical goals   Care Tool:  Bathing    Body parts bathed by patient: Right arm, Chest, Abdomen, Front perineal area, Buttocks, Right upper leg, Left upper leg, Face, Right lower leg, Left lower leg   Body parts bathed by helper: Left arm     Bathing assist Assist Level: Minimal Assistance - Patient > 75%     Upper Body Dressing/Undressing Upper body dressing   What is the patient wearing?: Pull over shirt    Upper body assist Assist Level: Minimal Assistance - Patient > 75%    Lower Body Dressing/Undressing Lower body dressing      What is the patient wearing?: Pants, Incontinence brief     Lower body assist Assist for lower body dressing: Moderate Assistance - Patient 50 - 74%     Toileting Toileting    Toileting assist Assist for toileting: Supervision/Verbal cueing     Transfers Chair/bed transfer  Transfers assist     Chair/bed transfer assist level: Contact Guard/Touching assist     Locomotion Ambulation   Ambulation assist      Assist level: Contact Guard/Touching assist Assistive device: No Device Max distance: 200'   Walk 10 feet activity   Assist     Assist level: Contact Guard/Touching assist Assistive device: No Device   Walk 50 feet activity   Assist  Assist level: Minimal Assistance - Patient > 75% Assistive device: No Device    Walk 150 feet activity   Assist Walk 150 feet activity did not occur: Safety/medical concerns (fatigue)  Assist level: Minimal Assistance - Patient > 75% Assistive device: No Device    Walk 10 feet on uneven surface  activity   Assist Walk 10 feet on uneven surfaces activity did not occur: Safety/medical concerns         Wheelchair     Assist Will patient use wheelchair at discharge?: No             Wheelchair 50 feet with 2 turns  activity    Assist            Wheelchair 150 feet activity     Assist          Medical Problem List and Plan: 1.    Global aphasia, right hemiparesis with impaired mobility and ADLs secondary to acute ischemic left ICA stroke  Continue CIR  WHO nightly 2.  L-ICA stent/Antithrombotics: -DVT/anticoagulation:  Pharmaceutical: Lovenox 40mg  q24H.              -antiplatelet therapy: On ASA/Brilinta 3. Pain Management: N/A 4. Mood: LCSW to follow for evaluation and support.              -antipsychotic agents: N/A 5. Neuropsych: This patient is not fully capable of making decisions on her own behalf. 6. Skin/Wound Care: Routine pressure relief measures.  7. Fluids/Electrolytes/Nutrition: Monitor I/Os.  8. HTN: Monitor BP, high graded R-ICA stenosis. Per neurology SBP goals 130-150 range.   Lisinopril decreased on 10/11  HCTZ daily DC'd on 10/9  Relatively controlled on 10/15  Monitor with increased mobility 9. T2DM- diet controlled?: Hgb A1C-6.1. Will monitor BS ac/hs and use SSI for elevated BS as indicated.   Slightly elevated on 10/15  Will consider changes to carb modified diet when on regular textures-see #15  Monitor with increased mobility 10. Dyslipidemia: On Zocor 80 mg.  11. Intermittent Hypokalemia:   Potassium 3.5 on 10/15, labs ordered for Monday  Continue to monitor 12. ABLA:   Hemoglobin 10.6 on 10/11  Continue to monitor 13. Glaucoma: Stable on Xalatan and Cosopt.   14. Overweight (BMI 29.53): provide dietary counseling 15.  Post stroke dysphagia  D2 thins, advance diet as tolerated 16.  Mild transaminitis: Resolved  ALT within normal limits on 9/11 17.  Slow transit constipation  Bowel meds scheduled on 10/12  Bowel meds increased x1 day on 10/15  LOS: 9 days A FACE TO FACE EVALUATION WAS PERFORMED  Kery Batzel 11/15 07/20/2020, 10:20 AM

## 2020-07-20 NOTE — Progress Notes (Signed)
Speech Language Pathology Weekly Progress and Session Note  Patient Details  Name: Sharon Gilbert MRN: [id number] Date of Birth: [birth year]  Beginning of progress report period: July 12, 2020 End of progress report period: July 20, 2020  Today's Date: 07/20/2020 SLP Individual Time: 0800-0900    Short Term Goals: Week 1: SLP Short Term Goal 1 (Week 1): Patient will identify object/noun pictures in field of 6 with 85% accuracy. SLP Short Term Goal 1 - Progress (Week 1): Met SLP Short Term Goal 2 (Week 1): Patient will point to verb/action pictures in field of 3-4 with 80% accuracy. SLP Short Term Goal 2 - Progress (Week 1): Met SLP Short Term Goal 3 (Week 1): Patient will imitate at word level with maxA cues for 70% accuracy. SLP Short Term Goal 3 - Progress (Week 1): Met SLP Short Term Goal 4 (Week 1): Patient will use task-specific communication board during structured tasks with minA SLP Short Term Goal 4 - Progress (Week 1): Progressing toward goal SLP Short Term Goal 5 (Week 1): Patient will tolerate trials of Dys 3 solids with trace to minimal oral residuals and minA to clear. SLP Short Term Goal 5 - Progress (Week 1): Progressing toward goal    New Short Term Goals: Week 2: SLP Short Term Goal 1 (Week 2): STG's=LTG's (ELOS is for discharge 10/21)  Weekly Progress Updates:  Patient met 3/5 STG's and is making progress with the two goals she did not quite meet. She was upgraded to Dys 3 solids recently and continues to tolerate thin liquids without overt s/s aspiration or penetration. She is exhibiting mod-severe verbal apraxia but is able to imitate at phoneme and CV (consonant-vowel) word level with cues. She is able to non-verbally indicate her wants/needs via gestures, vocalizing, verbalizing, facial expressions. Plan is for discharge 10/21 home with 24 hour supervision from family and [place] vs OP SLP therapy.   Intensity: Minumum of 1-2 x/day, 30 to 90 minutes Frequency:  3 to 5 out of 7 days Duration/Length of Stay: 10/21 Treatment/Interventions: Dysphagia/aspiration precaution training;Cognitive remediation/compensation;Environmental controls;Functional tasks;Multimodal communication approach;Patient/family education;Speech/Language facilitation   Daily Session  Skilled Therapeutic Interventions: Patient seen with son present for portion of session for education regarding patient's progress. Patient was able to imitate SLP at vowel phoneme and consonant phoneme level with mod-max cues and with improved accuracy with vowel phonemes when paired with initial /l/ which is a strength for her (lah, low, lee, etc). She is still not able to imitate to produce back lingual phonemes (/k/, /g/). Patient able to communicate via non-verbal means to direct her care and needs of using bathroom, putting items away, wanting to change into fresh clothes. Patient continues to benefit from skilled SLP intervention to maximize language and swallow function prior to discharge.    General   pleasant, son in room Pain Pain Assessment Pain Scale: 0-10 Faces Pain Scale: No hurt  Therapy/Group: Individual Therapy  Sonia Baller, MA, CCC-SLP Speech Therapy

## 2020-07-21 ENCOUNTER — Inpatient Hospital Stay (HOSPITAL_COMMUNITY): Payer: Medicare HMO

## 2020-07-21 ENCOUNTER — Inpatient Hospital Stay (HOSPITAL_COMMUNITY): Payer: Medicare HMO | Admitting: Occupational Therapy

## 2020-07-21 LAB — GLUCOSE, CAPILLARY
Glucose-Capillary: 110 mg/dL — ABNORMAL HIGH (ref 70–99)
Glucose-Capillary: 93 mg/dL (ref 70–99)
Glucose-Capillary: 100 mg/dL — ABNORMAL HIGH (ref 70–99)
Glucose-Capillary: 109 mg/dL — ABNORMAL HIGH (ref 70–99)

## 2020-07-21 MED ORDER — MUSCLE RUB 10-15 % EX CREA
TOPICAL_CREAM | CUTANEOUS | Status: DC | PRN
  Filled 2020-07-21: qty 85

## 2020-07-21 NOTE — Progress Notes (Addendum)
Physical Therapy Session Note  Patient Details  Name: Sharon Gilbert MRN: [id number] Date of Birth: 12/04/1940  Today's Date: 07/21/2020 PT Individual Time: 1045-1200 PT Individual Time Calculation (min): 75 min   Short Term Goals: Week 1:  PT Short Term Goal 1 (Week 1): Pt will perform bed mobility with CGA and no bed features PT Short Term Goal 2 (Week 1): Pt will perform bed<>chair transfers with LRAD and CGA PT Short Term Goal 3 (Week 1): Pt will ambulate at least 135ft with CGA and LRAD PT Short Term Goal 4 (Week 1): Pt will navigate up/down at least x4 steps with CGA and LRAD Week 2:  PT Short Term Goal 1 (Week 2): STG = LTG due to ELOS      Skilled Therapeutic Interventions/Progress Updates:  Pt seated in recliner in room.  Son Sharon Gilbert present.  Pt sit> stand with close supervision.  She hugged her son goodbye with urging, using bil UEs across his back.  Son was happy t see her use bil UEs for this.  She stated "see you later" to him, spontaneously.  Gait on level tile x 150' with close supervision/CGA.  Up/down 12 steps with L rail, close supervision.  Gait pushing grocery cart for spontaneous use of R hand, to retrieve objects around room, using bil hands to retrieve, with cues, and place in cart.  Advanced gait on level tile, to return to room, kicking Yoga block with R/L foot, close supervision without LOB.   Therapeutic activities in standing on compliant mat for balance challenge, with assistance for use of R hand for movements requiring R hand grip or neutral hand on top of object, reaching overhead with assistance, pincer grip with assistance to pass card to L hand.  Dual task in standing, matching playing cards numbers and suits, on board in front of her, using L hand to place them and R hand to remove them, with assistance.   neuromuscular re-education/balance challenges, performing mini squats, toe/heel raises, with L hand support on back of chair. Seated R/L long arc quad  knee extensions.       Therapy Documentation Precautions:  Precautions Precautions: Fall Precaution Comments: R inattention, R HB weakness (RUE>RLE), expressive > receptive aphasia Restrictions Weight Bearing Restrictions: No  Pain: pt denied      Therapy/Group: Individual Therapy  Other [place] 07/21/2020, 12:13 PM

## 2020-07-21 NOTE — Progress Notes (Signed)
[place] PHYSICAL MEDICINE & REHABILITATION PROGRESS NOTE  Subjective/Complaints: Remains aphasic, follows commands with visual cues , son at bedside without concerns , discussed improvement R hand strenght Points to and rubs Left side neck/trap area  ROS: limited due to aphasia.  Objective: Vital Signs: Blood pressure (!) 112/58, pulse 78, temperature 98.7 F (37.1 C), temperature source Oral, resp. rate 18, height 5\' 5"  (1.651 m), weight 75 kg, SpO2 100 %. No results found. No results for input(s): WBC, HGB, HCT, PLT in the last 72 hours. Recent Labs    07/20/20 0440  NA 136  K 3.5  CL 104  CO2 22  GLUCOSE 112*  BUN 12  CREATININE 0.75  CALCIUM 8.9    Intake/Output Summary (Last 24 hours) at 07/21/2020 0900 Last data filed at 07/20/2020 1845 Gross per 24 hour  Intake 354 ml  Output --  Net 354 ml        Physical Exam: BP (!) 112/58 (BP Location: Left Arm)   Pulse 78   Temp 98.7 F (37.1 C) (Oral)   Resp 18   Ht 5\' 5"  (1.651 m)   Wt 75 kg   SpO2 100%   BMI 27.51 kg/m   General: No acute distress Mood and affect are appropriate Heart: Regular rate and rhythm no rubs murmurs or extra sounds Lungs: Clear to auscultation, breathing unlabored, no rales or wheezes Abdomen: Positive bowel sounds, soft nontender to palpation, nondistended Extremities: No clubbing, cyanosis, or edema Skin: No evidence of breakdown, no evidence of rash  Musculoskeletal: no pain with arm or leg ROM, mild tenderness left upper trap and neck paraspinal   Right facial weakness.  Motor: RUE: Shoulder abduction, elbow flexion/extension 4/5, wrist extension 2/5, 3- RIght finger flexors, 2- wrist ext (improved) RLE: 4+/5 proximal to distal  Assessment/Plan: 1. Functional deficits secondary to acute ischemic left ICA stroke which require 3+ hours per day of interdisciplinary therapy in a comprehensive inpatient rehab setting.  Physiatrist is providing close team supervision and 24  hour management of active medical problems listed below.  Physiatrist and rehab team continue to assess barriers to discharge/monitor patient progress toward functional and medical goals   Care Tool:  Bathing    Body parts bathed by patient: Right arm, Chest, Abdomen, Front perineal area, Buttocks, Right upper leg, Left upper leg, Face, Right lower leg, Left lower leg   Body parts bathed by helper: Left arm     Bathing assist Assist Level: Minimal Assistance - Patient > 75%     Upper Body Dressing/Undressing Upper body dressing   What is the patient wearing?: Pull over shirt    Upper body assist Assist Level: Minimal Assistance - Patient > 75%    Lower Body Dressing/Undressing Lower body dressing      What is the patient wearing?: Pants, Incontinence brief     Lower body assist Assist for lower body dressing: Moderate Assistance - Patient 50 - 74%     Toileting Toileting    Toileting assist Assist for toileting: Supervision/Verbal cueing     Transfers Chair/bed transfer  Transfers assist     Chair/bed transfer assist level: Contact Guard/Touching assist     Locomotion Ambulation   Ambulation assist      Assist level: Contact Guard/Touching assist Assistive device: No Device Max distance: 200'   Walk 10 feet activity   Assist     Assist level: Contact Guard/Touching assist Assistive device: No Device   Walk 50 feet activity   Assist  Assist level: Minimal Assistance - Patient > 75% Assistive device: No Device    Walk 150 feet activity   Assist Walk 150 feet activity did not occur: Safety/medical concerns (fatigue)  Assist level: Minimal Assistance - Patient > 75% Assistive device: No Device    Walk 10 feet on uneven surface  activity   Assist Walk 10 feet on uneven surfaces activity did not occur: Safety/medical concerns         Wheelchair     Assist Will patient use wheelchair at discharge?: No              Wheelchair 50 feet with 2 turns activity    Assist            Wheelchair 150 feet activity     Assist          Medical Problem List and Plan: 1.    Global aphasia, right hemiparesis with impaired mobility and ADLs secondary to acute ischemic left ICA stroke  Continue CIR  WHO nightly 2.  L-ICA stent/Antithrombotics: -DVT/anticoagulation:  Pharmaceutical: Lovenox 40mg  q24H.              -antiplatelet therapy: On ASA/Brilinta 3. Pain Management: N/A, mild soreness Left upper trap neck, may be exercise related order bengay  4. Mood: LCSW to follow for evaluation and support.              -antipsychotic agents: N/A 5. Neuropsych: This patient is not fully capable of making decisions on her own behalf. 6. Skin/Wound Care: Routine pressure relief measures.  7. Fluids/Electrolytes/Nutrition: Monitor I/Os.  8. HTN: Monitor BP, high graded R-ICA stenosis. Per neurology SBP goals 130-150 range.   Lisinopril decreased on 10/11  HCTZ daily DC'd on 10/9   Vitals:   07/20/20 1924 07/21/20 0555  BP: (!) 133/46 (!) 112/58  Pulse: (!) 42 78  Resp: 18 18  Temp: 98.8 F (37.1 C) 98.7 F (37.1 C)  SpO2: 99% 100%  controlled 10/16 9. T2DM- diet controlled?: Hgb A1C-6.1. Will monitor BS ac/hs and use SSI for elevated BS as indicated.   Slightly elevated on 10/15  Will consider changes to carb modified diet when on regular textures-see #15  Monitor with increased mobility 10. Dyslipidemia: On Zocor 80 mg.  11. Intermittent Hypokalemia:   Potassium 3.5 on 10/15, labs ordered for Monday  Continue to monitor 12. ABLA:   Hemoglobin 10.6 on 10/11  Continue to monitor 13. Glaucoma: Stable on Xalatan and Cosopt.   14. Overweight (BMI 29.53): provide dietary counseling 15.  Post stroke dysphagia  D2 thins, advance diet as tolerated 16.  Mild transaminitis: Resolved  ALT within normal limits on 9/11 17.  Slow transit constipation- large BM 10/15 after bisacodyl supp, cont  daily Miralax    LOS: 10 days A FACE TO FACE EVALUATION WAS PERFORMED  11/15 07/21/2020, 9:00 AM

## 2020-07-21 NOTE — Progress Notes (Signed)
Occupational Therapy Session Note  Patient Details  Name: Sharon Gilbert MRN: [id number] Date of Birth: [birth year]  Today's Date: 07/21/2020 OT Individual Time: 1435-1531 OT Individual Time Calculation (min): 56 min   Short Term Goals: Week 1:  OT Short Term Goal 1 (Week 1): Pt will complete UB dressing with supervision OT Short Term Goal 2 (Week 1): Pt will complete bathing with CGA. OT Short Term Goal 3 (Week 1): Pt will complete LB dressing with supervision. OT Short Term Goal 4 (Week 1): Pt will complete toilet transfer with supervision.  Skilled Therapeutic Interventions/Progress Updates:    Pt greeted in the recliner with no c/o pain, agreeable to start session by using the restroom. Ambulatory transfer to toilet completed with CGA without AD. Min A for clothing mgt on the Rt side. Pt able to complete perihygiene with Min balance assistance (+bladder void). After handwashing at the sink with vcs for sequencing, pt ambulated without device and CGA to the dayroom, vcs for increasing Rt arm swing during ambulation. To work on praxis, Rt NMR, and dynamic balance, pt participated in Wii Just Dance in seated and standing positions. CGA when standing. Pts affect was visibly bright throughout participation. Afterwards pt ambulated back to the room with CGA HHA on the Rt side, vcs for pathfinding her way. Pt transferred to the recliner and was left with all needs within reach and safety belt fastened.   Therapy Documentation Precautions:  Precautions Precautions: Fall Precaution Comments: R inattention, R HB weakness (RUE>RLE), expressive > receptive aphasia Restrictions Weight Bearing Restrictions: No Vital Signs: Therapy Vitals Pulse Rate: 77 Resp: 18 BP: (!) 121/40 Patient Position (if appropriate): Sitting ADL: ADL Grooming: Supervision/safety Where Assessed-Grooming: Sitting at sink Upper Body Bathing: Minimal assistance Where Assessed-Upper Body Bathing: Sitting at sink Lower  Body Bathing: Minimal assistance Where Assessed-Lower Body Bathing: Sitting at sink, Standing at sink Upper Body Dressing: Moderate assistance Where Assessed-Upper Body Dressing: Sitting at sink Lower Body Dressing: Moderate assistance Where Assessed-Lower Body Dressing: Sitting at sink, Standing at sink Toileting: Minimal assistance Where Assessed-Toileting: Teacher, adult education: Furniture conservator/restorer Method: Proofreader: Grab bars     Therapy/Group: Individual Therapy  Brenson Hartman A Deangleo Passage 07/21/2020, 4:05 PM

## 2020-07-22 LAB — GLUCOSE, CAPILLARY
Glucose-Capillary: 113 mg/dL — ABNORMAL HIGH (ref 70–99)
Glucose-Capillary: 75 mg/dL (ref 70–99)
Glucose-Capillary: 114 mg/dL — ABNORMAL HIGH (ref 70–99)
Glucose-Capillary: 99 mg/dL (ref 70–99)

## 2020-07-23 ENCOUNTER — Inpatient Hospital Stay (HOSPITAL_COMMUNITY): Payer: Medicare HMO

## 2020-07-23 ENCOUNTER — Inpatient Hospital Stay (HOSPITAL_COMMUNITY): Payer: Medicare HMO | Admitting: Occupational Therapy

## 2020-07-23 LAB — GLUCOSE, CAPILLARY
Glucose-Capillary: 107 mg/dL — ABNORMAL HIGH (ref 70–99)
Glucose-Capillary: 85 mg/dL (ref 70–99)
Glucose-Capillary: 81 mg/dL (ref 70–99)

## 2020-07-23 LAB — BASIC METABOLIC PANEL
Anion gap: 12 (ref 5–15)
BUN: 9 mg/dL (ref 8–23)
CO2: 22 mmol/L (ref 22–32)
Calcium: 9.2 mg/dL (ref 8.9–10.3)
Chloride: 105 mmol/L (ref 98–111)
Creatinine, Ser: 0.82 mg/dL (ref 0.44–1.00)
GFR, Estimated: >60 mL/min (ref >60)
Glucose, Bld: 120 mg/dL — ABNORMAL HIGH (ref 70–99)
Potassium: 3.9 mmol/L (ref 3.5–5.1)
Sodium: 139 mmol/L (ref 135–145)

## 2020-07-23 LAB — CBC
HCT: 33.7 % — ABNORMAL LOW (ref 36.0–46.0)
Hemoglobin: 10.9 g/dL — ABNORMAL LOW (ref 12.0–15.0)
MCH: 27.9 pg (ref 26.0–34.0)
MCHC: 32.3 g/dL (ref 30.0–36.0)
MCV: 86.4 fL (ref 80.0–100.0)
Platelets: 456 10*3/uL — ABNORMAL HIGH (ref 150–400)
RBC: 3.9 MIL/uL (ref 3.87–5.11)
RDW: 13.1 % (ref 11.5–15.5)
WBC: 7.1 10*3/uL (ref 4.0–10.5)
nRBC: 0 % (ref 0.0–0.2)

## 2020-07-23 NOTE — Progress Notes (Signed)
Occupational Therapy Session Note  Patient Details  Name: Sharon Gilbert MRN: [id number] Date of Birth: January 07, 1941  Today's Date: 07/23/2020 OT Individual Time: 7782-4235 and 1005-1034 and 1416-1443 OT Individual Time Calculation (min): 55 min and 29 min and 27 min  Short Term Goals: Week 1:  OT Short Term Goal 1 (Week 1): Pt will complete UB dressing with supervision OT Short Term Goal 2 (Week 1): Pt will complete bathing with CGA. OT Short Term Goal 3 (Week 1): Pt will complete LB dressing with supervision. OT Short Term Goal 4 (Week 1): Pt will complete toilet transfer with supervision.  Skilled Therapeutic Interventions/Progress Updates:    Pt greeted in the recliner with no s/s pain. Amenable to shower today and picked out her clothes from dresser drawer beside her with min cuing. Started session with ambulatory transfer to the toilet without AD and CGA, CGA and vcs for lowering brief before sitting down on the toilet. She voided bladder, vcs for hygiene completion. Doffed soiled brief before transition to the shower. Pt then bathed at sit<stand level with close supervision for standing balance while she used the grab bar with the Rt hand. Able to grossly wash the Lt side using the Rt UE with cuing. Visual cues for utilizing seated figure 4 to wash feet. Assistance required for donning the Lt gripper sock and then pt returned to the recliner to dress at sit<stand level without AD. Close supervision-CGA for dynamic standing balance, Min A for fully elevating clothing on the Rt side with vcs for Rt attention assist with this aspect of ADL task. Had pt reach laterally towards the Rt with the Rt UE for her clothing items.Throughout session she needed assistance for functional grasp with the Rt hand due to distal>proximal weakness. Pt able to grossly apply lotion to Lt UE/Rt LE using the Rt UE. Oral care and handwashing completed while standing at the sink afterwards, once again assistance for  grasping with the Rt UE during functional reach with vcs to increase functional use of affected UE overall. At end of session pt remained sitting in the recliner with all needs within reach and safety belt fastened.   2nd Session 1:1 tx (29 min) Pt greeted in the recliner with no c/o pain. Amenable to tx. Started with Rt NMR activity using small peddle bike. Fastened pts Rt hand to the pedal using ACE wrap, 3 minutes forward pedaling then 3 minutes backwards, then 10 reps of just Rt hand pedaling forward with Min A for grip. Transitioned to using unweighted dowel rod for gentle AROM and bilateral integration. Pt required cues and Min A at times to maintain grip with the Rt hand during exercises. Note that she exhibits a functional grasp when she concentrates on the affected hand. Did better when cued to "squeeze both hands" instead of tactile or verbal cuing alone to improve Rt hand grip. At end of session pt remained sitting in the recliner with all needs within reach and safety belt fastened.      3rd Session 1:1 tx (27 min) Pt greeted in bed with no s/s pain. Tried using the communication board for expressing needs with pt having a bit of a tough time. When she was asked if she had to use the bathroom pt nodded and initiated getting up, when asked to point to toilet pt able to touch the toilet picture on her communication board. Close supervision for ambulatory transfer to toilet without AD. Close supervision while lowering clothing and also during  hygiene completion in standing (pt with +bladder void). Min A for elevating brief only afterwards, pt able to fully elevate pants with cuing for Rt attention. Vcs for using the Rt hand to dispense soap during handwashing at the sink afterwards. When she returned to the recliner, worked on grasp/release and reaching to target with the Rt UE. Note that during reaching activity, pt was unable to independently open digits with gravity minimized to grip foam toy for  grasp, but did better when releasing toy into OT's hand. Educated pt on a simple HEP involving squeezing the foam toy and then opening digits to release toy. At end of session pt remained sitting in the recliner with all needs within reach and safety belt fastened, independently working on her HEP.   Therapy Documentation Precautions:  Precautions Precautions: Fall Precaution Comments: R inattention, R HB weakness (RUE>RLE), expressive > receptive aphasia Restrictions Weight Bearing Restrictions: No ADL: ADL Grooming: Supervision/safety Where Assessed-Grooming: Sitting at sink Upper Body Bathing: Minimal assistance Where Assessed-Upper Body Bathing: Sitting at sink Lower Body Bathing: Minimal assistance Where Assessed-Lower Body Bathing: Sitting at sink, Standing at sink Upper Body Dressing: Moderate assistance Where Assessed-Upper Body Dressing: Sitting at sink Lower Body Dressing: Moderate assistance Where Assessed-Lower Body Dressing: Sitting at sink, Standing at sink Toileting: Minimal assistance Where Assessed-Toileting: Teacher, adult education: Furniture conservator/restorer Method: Proofreader: Grab bars      Therapy/Group: Individual Therapy  Alexina Niccoli A Shooter Tangen 07/23/2020, 12:16 PM

## 2020-07-23 NOTE — Progress Notes (Signed)
Physical Therapy Session Note  Patient Details  Name: Sharon Gilbert MRN: [id number] Date of Birth: 1941/09/29  Today's Date: 07/23/2020 PT Individual Time: 1100-1155 PT Individual Time Calculation (min): 55 min   Short Term Goals: Week 2:  PT Short Term Goal 1 (Week 2): STG = LTG due to ELOS  Skilled Therapeutic Interventions/Progress Updates:    Pt received sitting in recliner, awake and agreeable to PT session, no c/o pain. Sit<>stand with CGA and no AD from recliner, she ambulated with CGA (progressing to SBA) from her room to main therapy gym (~113ft) with no AD. Performed dynamic sitting balance training, focusing on RUE coordination, muscle activation timing, and trunk control. Performed tossing with beach ball with therapist ~94ft apart. She demonstrated good ability to use RUE compensatory strategies and would often try to rearrange her grip to accomodates throwing motion. Pt was accurate with all throws and had no LOB while sitting on edge of mat table. She then shot the same ball into basketball goal that was ~58ft high with therapist providing CGA while she was standing. Again, she showed good ability to use RUE to assist with "gripping" the ball. She was able to successfully shoot the ball while she was ~3-40ft away. Then performed NMR while standing on blue air-ex foam pad, tossing a ball into trampoline and focusing on reaction time, dynamic standing balance, and RUE coordination. When she was unable to catch the ball, she was instructed to go walk and grab it from the floor which she did several times. She completed these tasks with CGA. After seated rest, ambulated inside // bars with CGA to perform higher level dynamic balance training. While standing on balance board, with BUE support, PT provided perturbations to board to challenge righting reactions and alternating weight shift. Performed this in both sagital and frontal planes. Also performed trials of unsupported standing on  balance board with therapist providing minA guard, she was able to maintain unsupported standing for ~5 second bouts. Ambulated from her main therapy gym to dayroom gym with CGA/SBA and no AD. In dayroom gym, performed more standing balance on blue airex foam pad with dual cognitive task overlay by building soft lego structures to mimic the photo provided. She completed a level 2 photo requiring mod cues with CGA guard while standing and she completed a level 3 photo requiring max cues with CGA guard while standing. She was able to maintain standing on foam pad for >10 minutes during this activity. She then ambulated back to her room (>283ft) with CGA. Stand>sit with supervision to recliner. She remained seated in recliner with belt alarm on, needs in reach.  Therapy Documentation Precautions:  Precautions Precautions: Fall Precaution Comments: R inattention, R HB weakness (RUE>RLE), expressive > receptive aphasia Restrictions Weight Bearing Restrictions: No  Therapy/Group: Individual Therapy    [name] P Alexcia Schools PT 07/23/2020, 11:17 AM

## 2020-07-23 NOTE — Progress Notes (Signed)
Patient's family member is requesting Dr. Allena Katz give him a call to give him updates on patient. Patient's family member also requesting social work give him a call because discharge date may need to be changed. Son's name LC, [phone]

## 2020-07-23 NOTE — Progress Notes (Signed)
[place] PHYSICAL MEDICINE & REHABILITATION PROGRESS NOTE  Subjective/Complaints: Patient seen sitting up in her chair this morning.  She tapped her face,?  Indicating numbness.  No reported issues overnight.  ROS: limited due to aphasia  Objective: Vital Signs: Blood pressure 135/66, pulse 71, temperature 98.2 F (36.8 C), resp. rate 18, height 5\' 5"  (1.651 m), weight 75 kg, SpO2 100 %. No results found. Recent Labs    07/23/20 0706  WBC 7.1  HGB 10.9*  HCT 33.7*  PLT 456*   Recent Labs    07/23/20 0706  NA 139  K 3.9  CL 105  CO2 22  GLUCOSE 120*  BUN 9  CREATININE 0.82  CALCIUM 9.2    Intake/Output Summary (Last 24 hours) at 07/23/2020 1414 Last data filed at 07/23/2020 0912 Gross per 24 hour  Intake 480 ml  Output --  Net 480 ml        Physical Exam: BP 135/66 (BP Location: Left Arm)   Pulse 71   Temp 98.2 F (36.8 C)   Resp 18   Ht 5\' 5"  (1.651 m)   Wt 75 kg   SpO2 100%   BMI 27.51 kg/m  Constitutional: No distress . Vital signs reviewed. HENT: Normocephalic.  Atraumatic. Eyes: EOMI. No discharge. Cardiovascular: No JVD.  RRR. Respiratory: Normal effort.  No stridor.  Bilateral clear to auscultation. GI: Non-distended.  BS +. Skin: Warm and dry.  Intact. Psych: Normal mood.  Normal behavior. Musc: No edema in extremities.  No tenderness in extremities. Neuro: Alert Right facial weakness.  Motor: RUE: Shoulder abduction, elbow flexion/extension 4/5, wrist extension 2 +/5, 3-/5 RIght finger flexors RLE: 4+/5 proximal to distal  Assessment/Plan: 1. Functional deficits secondary to acute ischemic left ICA stroke which require 3+ hours per day of interdisciplinary therapy in a comprehensive inpatient rehab setting.  Physiatrist is providing close team supervision and 24 hour management of active medical problems listed below.  Physiatrist and rehab team continue to assess barriers to discharge/monitor patient progress toward functional and  medical goals   Care Tool:  Bathing    Body parts bathed by patient: Right arm, Chest, Abdomen, Front perineal area, Buttocks, Right upper leg, Left upper leg, Face, Right lower leg, Left lower leg, Left arm   Body parts bathed by helper: Left arm     Bathing assist Assist Level: Supervision/Verbal cueing     Upper Body Dressing/Undressing Upper body dressing   What is the patient wearing?: Pull over shirt    Upper body assist Assist Level: Minimal Assistance - Patient > 75%    Lower Body Dressing/Undressing Lower body dressing      What is the patient wearing?: Pants, Incontinence brief     Lower body assist Assist for lower body dressing: Moderate Assistance - Patient 50 - 74%     Toileting Toileting    Toileting assist Assist for toileting: Contact Guard/Touching assist     Transfers Chair/bed transfer  Transfers assist     Chair/bed transfer assist level: Contact Guard/Touching assist     Locomotion Ambulation   Ambulation assist      Assist level: Contact Guard/Touching assist Assistive device: No Device Max distance: 200'   Walk 10 feet activity   Assist     Assist level: Contact Guard/Touching assist Assistive device: No Device   Walk 50 feet activity   Assist    Assist level: Minimal Assistance - Patient > 75% Assistive device: No Device    Walk 150 feet  activity   Assist Walk 150 feet activity did not occur: Safety/medical concerns (fatigue)  Assist level: Minimal Assistance - Patient > 75% Assistive device: No Device    Walk 10 feet on uneven surface  activity   Assist Walk 10 feet on uneven surfaces activity did not occur: Safety/medical concerns         Wheelchair     Assist Will patient use wheelchair at discharge?: No             Wheelchair 50 feet with 2 turns activity    Assist            Wheelchair 150 feet activity     Assist          Medical Problem List and Plan: 1.     Global aphasia, right hemiparesis with impaired mobility and ADLs secondary to acute ischemic left ICA stroke  Continue CIR  WHO nightly 2.  L-ICA stent/Antithrombotics: -DVT/anticoagulation:  Pharmaceutical: Lovenox 40mg  q24H.              -antiplatelet therapy: On ASA/Brilinta 3. Pain Management:   Muscle rub as needed  Controlled on 10/18 4. Mood: LCSW to follow for evaluation and support.              -antipsychotic agents: N/A 5. Neuropsych: This patient is not fully capable of making decisions on her own behalf. 6. Skin/Wound Care: Routine pressure relief measures.  7. Fluids/Electrolytes/Nutrition: Monitor I/Os.  8. HTN: Monitor BP, high graded R-ICA stenosis. Per neurology SBP goals 130-150 range.   Lisinopril decreased on 10/11  HCTZ daily DC'd on 10/9   Vitals:   07/23/20 0522 07/23/20 1304  BP: (!) 109/46 135/66  Pulse: 77 71  Resp: 17 18  Temp: 99.3 F (37.4 C) 98.2 F (36.8 C)  SpO2: 99% 100%   Relatively controlled on 10/18 9. T2DM- diet controlled?: Hgb A1C-6.1. Will monitor BS ac/hs and use SSI for elevated BS as indicated.   Relatively controlled on 10/18  Will consider changes to carb modified diet when on regular textures-see #15  Monitor with increased mobility 10. Dyslipidemia: On Zocor 80 mg.  11. Intermittent Hypokalemia:   Potassium 3.9 on 10/18  Continue to monitor 12. ABLA:   Hemoglobin 10.6 on 10/11  Continue to monitor 13. Glaucoma: Stable on Xalatan and Cosopt.   14. Overweight (BMI 29.53): provide dietary counseling 15.  Post stroke dysphagia  D3 thins, advance diet as tolerated 16.  Mild transaminitis: Resolved  ALT within normal limits on 9/11 17.  Slow transit constipation  Improving    LOS: 12 days A FACE TO FACE EVALUATION WAS PERFORMED  Kimerly Rowand 11/11 07/23/2020, 2:14 PM

## 2020-07-23 NOTE — Progress Notes (Signed)
Speech Language Pathology Daily Session Note  Patient Details  Name: Sharon Gilbert MRN: [id number] Date of Birth: Oct 11, 1940  Today's Date: 07/23/2020 SLP Individual Time: 1302-1400 SLP Individual Time Calculation (min): 58 min  Short Term Goals: Week 2: SLP Short Term Goal 1 (Week 2): STG's=LTG's (ELOS is for discharge 10/21)  Skilled Therapeutic Interventions:Skilled ST services focused on language skills. SLP facilitated picture identification for ADLs in a field of 4 and 6 with increased accuracy from 60% to 80%. SLP created simple communication board with 6 and 4 cells to aid in expressing wants/needs. Pt demonstrates functional use of gestures and response to yes/no questions, therefore questioning need for communication board but could assist pt in expressing disire to eat, drink, confirming wet/dry brief....etc. SLP will continue determining effectiveness of communication board in upcoming sessions. SLP also facilitated verbal production of CV and CVC words with initial /b/ speech sounds. Given pictures and sentence completion cues pt demonstrated accuracy in 4 out 12 trials. Pt was able to produce all vowels in CV with initial /l/ speech sound. Pt demonstrated ability to write first name upon request and able to copy last name. Pt was left in room with call bell within reach and chair alarm set. SLP recommends to continue skilled services.     Pain Pain Assessment Pain Score: 0-No pain  Therapy/Group: Individual Therapy  Ramyah Pankowski  [facility] 07/23/2020, 4:44 PM

## 2020-07-24 ENCOUNTER — Inpatient Hospital Stay (HOSPITAL_COMMUNITY): Payer: Medicare HMO

## 2020-07-24 ENCOUNTER — Inpatient Hospital Stay (HOSPITAL_COMMUNITY): Payer: Medicare HMO | Admitting: Occupational Therapy

## 2020-07-24 ENCOUNTER — Inpatient Hospital Stay (HOSPITAL_COMMUNITY): Payer: Medicare HMO | Admitting: Speech Pathology

## 2020-07-24 LAB — GLUCOSE, CAPILLARY
Glucose-Capillary: 98 mg/dL (ref 70–99)
Glucose-Capillary: 102 mg/dL — ABNORMAL HIGH (ref 70–99)
Glucose-Capillary: 136 mg/dL — ABNORMAL HIGH (ref 70–99)
Glucose-Capillary: 86 mg/dL (ref 70–99)
Glucose-Capillary: 91 mg/dL (ref 70–99)

## 2020-07-24 MED ORDER — ACETAMINOPHEN 325 MG PO TABS: 325.0000 mg | ORAL_TABLET | ORAL | Status: DC | PRN

## 2020-07-24 MED ORDER — POLYETHYLENE GLYCOL 3350 17 G PO PACK
17.0000 g | PACK | Freq: Two times a day (BID) | ORAL | Status: DC
  Administered 2020-07-24: 17 g via ORAL
  Filled 2020-07-24 (×4): qty 1

## 2020-07-24 MED ORDER — MUSCLE RUB 10-15 % EX CREA: 1.0000 "application " | TOPICAL_CREAM | Freq: Two times a day (BID) | CUTANEOUS | 0 refills | Status: DC

## 2020-07-24 MED ORDER — SENNOSIDES-DOCUSATE SODIUM 8.6-50 MG PO TABS
1.0000 | ORAL_TABLET | Freq: Every day | ORAL | Status: DC
  Administered 2020-07-24: 1 via ORAL
  Filled 2020-07-24 (×2): qty 1

## 2020-07-24 NOTE — Progress Notes (Signed)
Patient ID: Sharon Gilbert, female   DOB: 04-23-1941, 79 y.o.   MRN: [id number]  Met with pt and son when here to answer his questions. He was wanting her to stay longer to get better. Discussed she is at the level for discharge and will still need 24/7 supervision due to her inability to express herself and this being a safety risk if she is home alone. She is going home to her sister's home and her sister is aware of her need for 24/7 supervision care. Son did talk with Pam-PA to get his medical questions answered. Asked to see dietician consult was placed for this. Son to be here Thursday to pick pt p after his dental surgery, asked if he needed to send another family member to pick up pt. He will talk with them.

## 2020-07-24 NOTE — Progress Notes (Signed)
Speech Language Pathology Daily Session Note  Patient Details  Name: Sharon Gilbert MRN: [id number] Date of Birth: May 10, 1941  Today's Date: 07/24/2020 SLP Individual Time: 0815-0913 SLP Individual Time Calculation (min): 58 min  Short Term Goals: Week 2: SLP Short Term Goal 1 (Week 2): STG's=LTG's (ELOS is for discharge 10/21)  Skilled Therapeutic Interventions: Pt was seen for skilled ST targeting education with pt's some and pt's communication goals. Skilled education including verbal review and handout provided to son with details regarding current diet recommendations (Dys 3) as well as education regarding swallow mechanics and rationale behind texture modification. Also provided some education regarding communication strategies and automatic speech tasks. Pt mostly only able to imitate at the phoneme level however did produce 3 CV combinations with Max A cueing (Moo, Tea, Toe). She demonstrated ability to use picture communication board with 7/8 accuracy. She also matched words (from field of 2) to line drawings with 95% accuracy and self corrected X2. During session she communicated via gestures and nonverbal feedback, also answering SLP's yes/no questions to communicate face/head pain with Min-Mod A. RN made aware and offered pt Tylenol. Pt left sitting in chair with alarm set and needs within reach, son still present. Continue per current plan of care.          Pain Pain Assessment Pain Scale: Faces Faces Pain Scale: Hurts a little bit Pain Type: Acute pain Pain Location: Head Pain Orientation: Left Pain Descriptors / Indicators: Headache Pain Onset: Gradual Pain Intervention(s): RN made aware Multiple Pain Sites: No  Therapy/Group: Individual Therapy  [name] 07/24/2020, 12:25 PM

## 2020-07-24 NOTE — Progress Notes (Addendum)
[facility] PHYSICAL MEDICINE & REHABILITATION PROGRESS NOTE  Subjective/Complaints: Patient seen sitting up in her chair this morning.  Son at bedside.  Indicates she slept well overnight, some improvement in expressive aphasia.  Son with several questions regarding dermatological surgery, vascular surgery follow-up, meds at discharge, DME, diet, blood pressure, etc.  ROS: limited due to aphasia  Objective: Vital Signs: Blood pressure (!) 122/51, pulse 75, temperature 98.9 F (37.2 C), resp. rate 18, height 5\' 5"  (1.651 m), weight 75 kg, SpO2 100 %. No results found. Recent Labs    07/23/20 0706  WBC 7.1  HGB 10.9*  HCT 33.7*  PLT 456*   Recent Labs    07/23/20 0706  NA 139  K 3.9  CL 105  CO2 22  GLUCOSE 120*  BUN 9  CREATININE 0.82  CALCIUM 9.2    Intake/Output Summary (Last 24 hours) at 07/24/2020 0929 Last data filed at 07/23/2020 2140 Gross per 24 hour  Intake 320 ml  Output --  Net 320 ml        Physical Exam: BP (!) 122/51   Pulse 75   Temp 98.9 F (37.2 C)   Resp 18   Ht 5\' 5"  (1.651 m)   Wt 75 kg   SpO2 100%   BMI 27.51 kg/m  Constitutional: No distress . Vital signs reviewed. HENT: Normocephalic.  Atraumatic. Eyes: EOMI. No discharge. Cardiovascular: No JVD.  RRR. Respiratory: Normal effort.  No stridor.  Bilateral clear to auscultation. GI: Non-distended.  BS +. Skin: Warm and dry.  Intact. Psych: Normal mood.  Normal behavior. Musc: No edema in extremities.  No tenderness in extremities. Neuro: Alert Expressive aphasia, improving Motor: RUE: Shoulder abduction, elbow flexion/extension 4/5, wrist extension 2 +/5, 3-/5 RIght finger flexors, improving RLE: 4+/5 proximal to distal  Assessment/Plan: 1. Functional deficits secondary to acute ischemic left ICA stroke which require 3+ hours per day of interdisciplinary therapy in a comprehensive inpatient rehab setting.  Physiatrist is providing close team supervision and 24 hour management  of active medical problems listed below.  Physiatrist and rehab team continue to assess barriers to discharge/monitor patient progress toward functional and medical goals   Care Tool:  Bathing    Body parts bathed by patient: Right arm, Chest, Abdomen, Front perineal area, Buttocks, Right upper leg, Left upper leg, Face, Right lower leg, Left lower leg, Left arm   Body parts bathed by helper: Left arm     Bathing assist Assist Level: Supervision/Verbal cueing     Upper Body Dressing/Undressing Upper body dressing   What is the patient wearing?: Pull over shirt    Upper body assist Assist Level: Minimal Assistance - Patient > 75%    Lower Body Dressing/Undressing Lower body dressing      What is the patient wearing?: Pants, Incontinence brief     Lower body assist Assist for lower body dressing: Moderate Assistance - Patient 50 - 74%     Toileting Toileting    Toileting assist Assist for toileting: Minimal Assistance - Patient > 75%     Transfers Chair/bed transfer  Transfers assist     Chair/bed transfer assist level: Contact Guard/Touching assist     Locomotion Ambulation   Ambulation assist      Assist level: Contact Guard/Touching assist Assistive device: No Device Max distance: 200'   Walk 10 feet activity   Assist     Assist level: Contact Guard/Touching assist Assistive device: No Device   Walk 50 feet activity  Assist    Assist level: Minimal Assistance - Patient > 75% Assistive device: No Device    Walk 150 feet activity   Assist Walk 150 feet activity did not occur: Safety/medical concerns (fatigue)  Assist level: Minimal Assistance - Patient > 75% Assistive device: No Device    Walk 10 feet on uneven surface  activity   Assist Walk 10 feet on uneven surfaces activity did not occur: Safety/medical concerns         Wheelchair     Assist Will patient use wheelchair at discharge?: No              Wheelchair 50 feet with 2 turns activity    Assist            Wheelchair 150 feet activity     Assist          Medical Problem List and Plan: 1.    Global aphasia, right hemiparesis with impaired mobility and ADLs secondary to acute ischemic left ICA stroke  Continue CIR  WHO nightly 2.  L-ICA stent/Antithrombotics: -DVT/anticoagulation:  Pharmaceutical: Lovenox 40mg  q24H.              -antiplatelet therapy: On ASA/Brilinta 3. Pain Management:   Muscle rub as needed  Controlled on 10/19 4. Mood: LCSW to follow for evaluation and support.              -antipsychotic agents: N/A 5. Neuropsych: This patient is not fully capable of making decisions on her own behalf. 6. Skin/Wound Care: Routine pressure relief measures.  7. Fluids/Electrolytes/Nutrition: Monitor I/Os.   Dietitian consulted per son request for education on anti-inflammatory diet. 8. HTN: Monitor BP, high graded R-ICA stenosis. Per neurology SBP goals 130-150 range.   Lisinopril decreased on 10/11  HCTZ daily DC'd on 10/9   Vitals:   07/23/20 2005 07/24/20 0617  BP: (!) 153/58 (!) 122/51  Pulse: 89 75  Resp: 18 18  Temp:  98.9 F (37.2 C)  SpO2: 96% 100%   Slightly labile on 10/19 9. T2DM- diet controlled?: Hgb A1C-6.1. Will monitor BS ac/hs and use SSI for elevated BS as indicated.   Relatively controlled on 10/19  Will consider changes to carb modified diet when on regular textures-see #15  Monitor with increased mobility 10. Dyslipidemia: On Zocor 80 mg.  11. Intermittent Hypokalemia:   Potassium 3.9 on 10/18  Continue to monitor 12. ABLA:   Hemoglobin 10.9 on 10/11  Continue to monitor 13. Glaucoma: Stable on Xalatan and Cosopt.   14. Overweight (BMI 29.53): provide dietary counseling 15.  Post stroke dysphagia  D3 thins, advance diet as tolerated 16.  Mild transaminitis: Resolved  ALT within normal limits on 9/11 17.  Slow transit constipation  Meds increased on  10/19    LOS: 13 days A FACE TO FACE EVALUATION WAS PERFORMED  Sharon Gilbert 11/19 07/24/2020, 9:29 AM

## 2020-07-24 NOTE — Progress Notes (Signed)
Occupational Therapy Session Note  Patient Details  Name: Sharon Gilbert MRN: [id number] Date of Birth: [birth year]  Today's Date: 07/24/2020 OT Individual Time: 2563-8937 OT Individual Time Calculation (min): 53 min    Short Term Goals: Week 1:  OT Short Term Goal 1 (Week 1): Pt will complete UB dressing with supervision OT Short Term Goal 2 (Week 1): Pt will complete bathing with CGA. OT Short Term Goal 3 (Week 1): Pt will complete LB dressing with supervision. OT Short Term Goal 4 (Week 1): Pt will complete toilet transfer with supervision.  Skilled Therapeutic Interventions/Progress Updates:    Pt sitting in recliner to start session with her son present.  Spoke to him briefly to start about pt's current level of assist per notes for selfcare tasks such as bathing and dressing.  He reports that a female will be assisting with this at discharge.  Also asked about whether she will be using a shower or a tub at discharge, he will follow-up with her sister where she is going to stay.  Discussed that she would need a shower seat for safety which he can purchase outside of here.  Had pt complete functional mobility down to the therapy gym for work on RUE neuromuscular re-education.  Had her focus on wrist and digit extension with hand on table and sliding it forward while attempting to maintain digit extension as her fingers went off of the edge.  She then worked on sliding the hand forward, and while trying to keep the palm down on the surface, she tried to extend her fingers in order to place them on a washcloth.   Mod facilitation to lift the digits off of the table as well as for keeping them extended while sliding the hand forward.  Therapist had her then work on reaching down to the therapy stool with the RUE and try to pick up small bean bags and place them beside of her on the mat.  She needed min assist overall to complete.  Therapist re-applied kinesiotape to the right wrist and included the  digits to help facilitate digit extension.  Returned to the room at end of session with supervision for ambulation without an assistive device.  Educated son on a few ideas of exercises and functional tasks to continue at home with [name] wrist extension and digit extension as well as having her work on picking up wadded up paper towels from the table and placing them into a container.  Pt left sitting in the recliner with the call button and phone in reach with safety belt in place.    Therapy Documentation Precautions:  Precautions Precautions: Fall Precaution Comments: R inattention, R HB weakness (RUE>RLE), expressive > receptive aphasia Restrictions Weight Bearing Restrictions: No  Pain: Pain Assessment Pain Scale: Faces Pain Score: 0-No pain Faces Pain Scale: Hurts a little bit Pain Type: Acute pain Pain Location: Head Pain Orientation: Left Pain Descriptors / Indicators: Headache Pain Onset: Gradual Pain Intervention(s): RN made aware Multiple Pain Sites: No ADL: See Care Tool Section for some details of mobility and selfcare  Therapy/Group: Individual Therapy  Shaine Mount OTR/L 07/24/2020, 3:52 PM

## 2020-07-24 NOTE — Progress Notes (Signed)
Physical Therapy Session Note  Patient Details  Name: Sharon Gilbert MRN: [id number] Date of Birth: 1940-12-27  Today's Date: 07/24/2020 PT Individual Time: 1000-1056 + 1445-1530 PT Individual Time Calculation (min): 56 min  + 45 min  Short Term Goals: Week 2:  PT Short Term Goal 1 (Week 2): STG = LTG due to ELOS  Skilled Therapeutic Interventions/Progress Updates:     1st session: Pt received sitting in recliner, agreeable to PT session. She appears to c/o L neck pain, pointing to lateral side of L neck. Noted what appears to be some sort of cyst. RN notified during session who said she would reach out to PA for follow-up. Pt in NAD throughout session. Pt's son at bedside as well, therefore focus of session family ed/training. Sit<>stand with supervision from recliner without AD. She ambulated ~173ft with supervision from her room to orthogym where she performed car transfer with supervision via lateral stepping method. Son present for demonstration and ed on technique. She ambulated up/down ~38ft ramp with supervision without R toe drag or LOB during 180deg turn. She then ambulated ~114ft to main therapy gym with supervision where she negotiated up/down x12 steps with supervision and B HR support, reciprocal pattern during ascent and step-to pattern during descent. Again, son was present for ed. She then ambulated ~54ft with supervision to ADL apartment room where she performed furniture transfers with supervision and performed supine<>sit and sit<>supine with supervision on regular bed. Instructed her to locate 3 items in kitchen where she was able to find 3/3 items, requiring mod cueing for searching lower cabinets. Son questioning her endurance level regarding gait distances, therefore she ambulated >572ft with supervision from ADL apartment room to [facility] waiting room. After brief seated rest break in [facility] waiting room, she ambulated >514ft back to her room with supervision and  stand>sit with supervision to recliner. She remained seated in recliner at end of session with belt alarm on, son at bedside, needs in reach. All questions and concerns addressed. Reiterated the importance of 24/7 S/A to son at DC, he reports pt's sister should be able to provide. Son requesting to speak to CSW after session, CSW notified.  2nd session: Pt received sitting in w/c, son at bedside, pt agreeable to PT session. No reports of pain. Son expressing concern regarding having a RW at DC. Spoke with son that pt hasn't ambulated with a RW since admission and is at a supervision level with gait without an AD. Also educated him on concern of RUE grip due to her R hand weakness and ability to maintain adequate grip to RW for it to be supportive. Son still concerned despite ed from prior session where she ambulated >539ft with supervision and no AD without LOB or any knee buckling. Pt ambulated with supervision and no AD from her room to main therapy gym. Stand>sit with supervision to mat table. Provided RW where she ambulated ~118ft with CGA and RW. She had difficulty maintaining adequate RUE grip to RW and RUE would often slide off the RW causing her to have to stop and readjust. She also had delayed righting reaction to fixing RUE when it began to slide off, possibly contributed to R inattention or impaired RUE sensation. Pt completed BERG, scoring 52/56, indicating lower falls risk. Score cutff for AD recommendation at DC <49/56. BERG score most impacted by single leg stance and speed of 360deg turns. She then performed forward/backward/lateral step off on 4inch platform with therapist providing CGA for stability,  she performed several repetitions of this. She then ambulated back to her room with supervision and no AD ~182ft and remained seated in recliner at end of session with needs in reach, belt alarm on, son at bedside. Informed son of pt's mobility status, meaning of BERG score, and concerns regarding  RW for ambulation as noted above. Son appears understanding.   Balance: Balance Balance Assessed: Yes Standardized Balance Assessment Standardized Balance Assessment: Berg Balance Test Berg Balance Test Sit to Stand: Able to stand without using hands and stabilize independently Standing Unsupported: Able to stand safely 2 minutes Sitting with Back Unsupported but Feet Supported on Floor or Stool: Able to sit safely and securely 2 minutes Stand to Sit: Sits safely with minimal use of hands Transfers: Able to transfer safely, minor use of hands Standing Unsupported with Eyes Closed: Able to stand 10 seconds safely Standing Ubsupported with Feet Together: Able to place feet together independently and stand 1 minute safely From Standing, Reach Forward with Outstretched Arm: Can reach forward >12 cm safely (5") From Standing Position, Pick up Object from Floor: Able to pick up shoe safely and easily From Standing Position, Turn to Look Behind Over each Shoulder: Looks behind from both sides and weight shifts well Turn 360 Degrees: Able to turn 360 degrees safely but slowly Standing Unsupported, Alternately Place Feet on Step/Stool: Able to stand independently and safely and complete 8 steps in 20 seconds Standing Unsupported, One Foot in Front: Able to place foot tandem independently and hold 30 seconds Standing on One Leg: Able to lift leg independently and hold 5-10 seconds Total Score: 52  Patient demonstrates increased fall risk as noted by score of 52/56 on Berg Balance Scale.  (<36= high risk for falls, close to 100%; 37-45 significant >80%; 46-51 moderate >50%; 52-55 lower >25%)   Therapy Documentation Precautions:  Precautions Precautions: Fall Precaution Comments: R inattention, R HB weakness (RUE>RLE), expressive > receptive aphasia Restrictions Weight Bearing Restrictions: No  Therapy/Group: Individual Therapy  Priya Matsen P Neftaly Swiss PT 07/24/2020, 7:58 AM

## 2020-07-24 NOTE — Progress Notes (Addendum)
Sebaceous cyst noted on left nape. Will order warm moist compresses 5x day to help drain. Son and patient updated on plan. Also discussed diet, statin benefits, follow up with Dr. Corliss Skains on outpatient basis as son concerned about R-CAS, NO DRIVING and need for 24 hrs SUPERVISION.

## 2020-07-25 ENCOUNTER — Encounter: Payer: Medicare HMO | Admitting: Neurology

## 2020-07-25 ENCOUNTER — Inpatient Hospital Stay (HOSPITAL_COMMUNITY): Payer: Medicare HMO | Admitting: Occupational Therapy

## 2020-07-25 ENCOUNTER — Inpatient Hospital Stay (HOSPITAL_COMMUNITY): Payer: Medicare HMO

## 2020-07-25 ENCOUNTER — Inpatient Hospital Stay (HOSPITAL_COMMUNITY): Payer: Medicare HMO | Admitting: Physical Therapy

## 2020-07-25 DIAGNOSIS — E785 Hyperlipidemia, unspecified: Secondary | ICD-10-CM

## 2020-07-25 DIAGNOSIS — G8191 Hemiplegia, unspecified affecting right dominant side: Secondary | ICD-10-CM

## 2020-07-25 LAB — GLUCOSE, CAPILLARY
Glucose-Capillary: 99 mg/dL (ref 70–99)
Glucose-Capillary: 90 mg/dL (ref 70–99)
Glucose-Capillary: 102 mg/dL — ABNORMAL HIGH (ref 70–99)
Glucose-Capillary: 88 mg/dL (ref 70–99)

## 2020-07-25 NOTE — Progress Notes (Addendum)
Inpatient Rehabilitation Care Coordinator  Discharge Note  The overall goal for the admission was met for:   Discharge location: Yes-HOME WITH SISTER WHO CAN PROVIDE 24 HR SUPERVISION  Length of Stay: Yes-15 DAYS  Discharge activity level: Yes-SUPERVISION LEVEL. MULTIPLE CONVERSATIONS WITH SISTER REGARDING PT'S CARE NEEDS.   Home/community participation: Yes  Services provided included: MD, RD, PT, OT, SLP, RN, CM, Pharmacy and SW  Financial Services: Private Insurance: [employer]  Follow-up services arranged: Home Health: [facility]-PT,OT,SP, DME: [facility] B[facility] & 3 IN 1 and Patient/Family has no preference for HH/DME agencies  Comments (or additional information):PLAN TO GO TO SISTER'S HOME WHO CAN PROVIDE 24/7 SUPERVISION. LC-SON WAS NOT WILLING TO ASSIST AND WOULD NOT COMMIT TO ASSISTING HER  Patient/Family verbalized understanding of follow-up arrangements: Yes  Individual responsible for coordination of the follow-up plan: DORIS-SISTER [phone]  Confirmed correct DME delivered: Elease Hashimoto 07/25/2020    Elease Hashimoto

## 2020-07-25 NOTE — Progress Notes (Signed)
Patient ID: Sharon Gilbert, female   DOB: Sep 23, 1941, 79 y.o.   MRN: [id number] Spoke with Doris-sister via telephone to confirm discharge tomorrow and pt's care needs of 24 hr supervision. Pt's son-LC does not communicate with with Tyler Aas, so she wanted to know the plan for tomorrow. Informed LC informed us he had a dental/oral surgeon tomorrow and if he could drive to trasnsport pt to her sister's tomorrow. He felt he could or would reach out to other family members to transport pt to Doris's home tomorrow. Doris reports the equipment has been delivered 10/19. Informed [facility] would be contacting her to set up follow up therapy appointments. Tyler Aas is pleased with her progress and feels comfortable with her coming to her home tomorrow.

## 2020-07-25 NOTE — Patient Care Conference (Signed)
Inpatient RehabilitationTeam Conference and Plan of Care Update Date: 07/25/2020   Time: 11:14 AM    Patient Name: Sharon Gilbert      Medical Record Number: [id number]  Date of Birth: 1941/07/18 Sex: Female         Room/Bed: 4M12C/4M12C-01 Payor Info: Payor: [employer] MEDICARE / Plan: [employer] MEDICARE HMO/PPO / Product Type: *No Product type* /    Admit Date/Time:  07/11/2020  3:45 PM  Primary Diagnosis:  Acute ischemic left ICA stroke [facility])  Hospital Problems: Principal Problem:   Acute ischemic left ICA stroke (HCC) d/t LAA R ICA Active Problems:   Acute ischemic left MCA stroke (HCC)   Dysphagia, post-stroke   Acute blood loss anemia   Hypokalemia   Controlled type 2 diabetes mellitus with complication, without long-term current use of insulin (HCC)   Stenosis of right internal carotid artery   Transaminitis   History of hypertension   Drug-induced hypotension   Labile blood glucose   Slow transit constipation   Dyslipidemia   Right hemiparesis [facility])    Expected Discharge Date: Expected Discharge Date: 07/26/20  Team Members Present: Physician leading conference: Dr. Maryla Morrow Care Coodinator Present: Chana Bode, RN, BSN, CRRN;Becky Dupree, LCSW Nurse Present: Margot Ables, LPN PT Present: Wynelle Link, PT OT Present: Dolphus Jenny, OT SLP Present: Colin Benton, SLP PPS Coordinator present : Fae Pippin, [place], PT     Current Status/Progress Goal Weekly Team Focus  Bowel/Bladder   continent of b/b; LBM: 10/19  remain continent of b/b  assist with toileting needs prn   Swallow/Nutrition/ Hydration             ADL's   supervision for UB bathing with min assist for UB dressing, min guard for LB bathing with min to mod for LB dressing.  Still with decreased RUE use with limitations more distal in the wrist and hand with extension.  mod I/supervision  transfer training, balance retraining, neuromuscular re-education, therapeutic exercise,  pt/family education, DME education   Mobility   supervision bed mobility, supervision transfers, supervision gait no AD >574ft, 12 steps with supervision  mod I / supervision  dynamic standing balance, higher level gait, DC planning   Communication             Safety/Cognition/ Behavioral Observations            Pain   no c/o pain  remain pain free  assess QS and prn   Skin   skin intact  maintain skin integrity  assess skin QS and prn     Discharge Planning:  Going to sister's home where she will have 24/7 supervision level. Equipment delivered to home and follow up set up home health. Son here yesterday spoke with team and PA-got questions answered.   Team Discussion: Incontinent at times. Right UE weakness continues however note hand and finger movement. Progress impaired by apraxia and aphasia. Patient on target to meet rehab goals: Yes; overall, independent for bed mobility, supervision for toileting however requires 24/7 supervision and SLP downgraded goals for problem solving and expression of basic needs.   *See Care Plan and progress notes for long and short-term goals.   Revisions to Treatment Plan:  Added communication board and downgraded goals to mod-max assist for problem solving and basic need expression Teaching Needs: Transfers, toileting, medications,communication assistance, etc.  Current Barriers to Discharge: Decreased caregiver support and access to ALF with 24 caregivers  Possible Resolutions to Barriers: ILF assess for admission to  ALF on site pending approval Family education SNF placement if ALF does not approve admission     Medical Summary Current Status: Global aphasia, right hemiparesis with impaired mobility and ADLs secondary to acute ischemic left ICA stroke  Barriers to Discharge: Behavior;Medical stability;Incontinence;Weight   Possible Resolutions to [employer]: Therapies, continue to advance diet as tolerated, family edu,  optimize DM/HTN meds   Continued Need for Acute Rehabilitation Level of Care: The patient requires daily medical management by a physician with specialized training in physical medicine and rehabilitation for the following reasons: Direction of a multidisciplinary physical rehabilitation program to maximize functional independence : Yes Medical management of patient stability for increased activity during participation in an intensive rehabilitation regime.: Yes Analysis of laboratory values and/or radiology reports with any subsequent need for medication adjustment and/or medical intervention. : Yes   I attest that I was present, lead the team conference, and concur with the assessment and plan of the team.   Chana Bode B 07/25/2020, 1:49 PM

## 2020-07-25 NOTE — Progress Notes (Signed)
Occupational Therapy Discharge Summary  Patient Details  Name: Sharon Gilbert MRN: [id number] Date of Birth: 09-11-1941  Today's Date: 07/25/2020 OT Individual Time: 1447-1531 OT Individual Time Calculation (min): 44 min   Session Note:  Pt's son present for session.  Issued handout for RUE AAROM/AROM exercises and functional tasks that can be completed daily at home.  Had pt complete 1 set of 10 reps for shoulder flexion, elbow flexion/extension, supination/pronation, wrist extension and digit extension.  She still demonstrates increased weakness in wrist extension as well as digit extension.  Had her also work on washing the bedside table with the RUE and emphasis on maintaining digit extension as well as wrist extension during task.  Therapist also placed balled up paper towels on the bedside table and had her work on picking them up and placing them in therapist's hand as well as the trash can. When having to reach up to place them, she would drop the paper towel 50% of the time.  Also educated her on placing her hand on the table and then reaching to washcloth by trying to lift her wrist and fingers off the table while keeping the palm of her hand on it.  She needs mod assist to achieve digit and wrist extension with her hand on the table.  Finished session with pt in the wheelchair and with the call button and phone in reach and safety belt in place.  All of her son's questions were answered as well.     Patient has met 6 of 15 long term goals due to improved activity tolerance, improved balance, ability to compensate for deficits, functional use of  RIGHT upper extremity, improved attention, improved awareness and improved coordination.  Patient to discharge at overall Supervision level.  Patient's care partner is independent to provide the necessary physical and cognitive assistance at discharge.    Reasons goals not met: Ms. Klaus continues to need supervision to mod assist for completion of  bathing and dressing tasks as well as supervision for toileting tasks, toleting, shower transfers, and home management tasks.  Recommendation:  Patient will benefit from ongoing skilled OT services in home health setting to continue to advance functional skills in the area of BADL and Reduce care partner burden.  Pt continues to demonstrate decreased safety awareness as well as decreased expressive and receptive difficulties, which require the need for 24 hour supervision.  In addition, RUE function continues to be limited with more difficulty noted distally at the wrist and hand with regards to AROM and functional use.  Feel she will benefit from [facility] to further progress ADL function as well as RUE use to return to supervision/modified independent level.    Equipment: No equipment provided  Reasons for discharge: treatment goals met and discharge from hospital  Patient/family agrees with progress made and goals achieved: Yes  OT Discharge Precautions/Restrictions  Precautions Precautions: Fall Precaution Comments: R HB weakness (RUE>RLE), expressive > receptive aphasia Restrictions Weight Bearing Restrictions: No  Pain Pain Assessment Pain Scale: Faces Pain Score: 0-No pain ADL ADL Eating: Set up Where Assessed-Eating: Wheelchair Grooming: Setup Where Assessed-Grooming: Wheelchair, Sitting at sink Upper Body Bathing: Supervision/safety Where Assessed-Upper Body Bathing: Sitting at sink Lower Body Bathing: Supervision/safety Where Assessed-Lower Body Bathing: Wheelchair, Sitting at sink, Standing at sink Upper Body Dressing: Supervision/safety Where Assessed-Upper Body Dressing: Wheelchair Lower Body Dressing: Moderate assistance Where Assessed-Lower Body Dressing: Wheelchair, Sitting at sink, Standing at sink Toileting: Supervision/safety Where Assessed-Toileting: Bedside Commode Toilet Transfer: Programmer, applications  Method: Ambulating Science writer: Architectural technologist: Close supervison Clinical cytogeneticist Method: Optometrist: Civil engineer, contracting with back Social research officer, government: Close supervision Social research officer, government Method: Heritage manager: Civil engineer, contracting with back Vision Baseline Vision/History: Wears glasses Wears Glasses: Reading only Patient Visual Report: No change from baseline Vision Assessment?: No apparent visual deficits Eye Alignment: Within Functional Limits Ocular Range of Motion: Within Functional Limits Alignment/Gaze Preference: Within Defined Limits Tracking/Visual Pursuits: Able to track stimulus in all quads without difficulty Additional Comments: Pt with right visual inattention at times functionally, but difficulty to accurately assess secondary to decreased ability to follow instructions for testing. Perception  Perception: Impaired Comments: mild right inattention Praxis Praxis: Intact Cognition Overall Cognitive Status: Impaired/Different from baseline Arousal/Alertness: Awake/alert Attention: Sustained;Focused Focused Attention: Appears intact Sustained Attention: Appears intact Memory: Impaired Memory Impairment: Decreased recall of new information;Decreased short term memory Problem Solving: Impaired Problem Solving Impairment: Functional basic Sequencing: Impaired Sequencing Impairment: Verbal basic;Functional basic Sensation Sensation Light Touch: Appears Intact Hot/Cold: Not tested Stereognosis: Not tested Additional Comments: Pt able to detect touch throughout the RUE but unable to accurately test proprioception secondary to receptive and expressive difficulties. Coordination Gross Motor Movements are Fluid and Coordinated: No Fine Motor Movements are Fluid and Coordinated: No Coordination and Movement Description: Pt with decreased RUE functional use.  Currently, uses as a stabilizer for holding small items with supervision. Motor  Motor Motor:  Hemiplegia Motor - Discharge Observations: RUE hemiparesis Mobility  Transfers Sit to Stand: Independent Stand to Sit: Independent  Trunk/Postural Assessment  Cervical Assessment Cervical Assessment: Within Functional Limits Thoracic Assessment Thoracic Assessment: Within Functional Limits Lumbar Assessment Lumbar Assessment: Within Functional Limits Postural Control Postural Control: Within Functional Limits  Balance Balance Balance Assessed: Yes Static Sitting Balance Static Sitting - Balance Support: Feet supported Static Sitting - Level of Assistance: 7: Independent Dynamic Sitting Balance Dynamic Sitting - Balance Support: Feet supported Dynamic Sitting - Level of Assistance: 7: Independent Static Standing Balance Static Standing - Balance Support: During functional activity Static Standing - Level of Assistance: 7: Independent Dynamic Standing Balance Dynamic Standing - Balance Support: During functional activity Dynamic Standing - Level of Assistance: 7: Independent Extremity/Trunk Assessment RUE Assessment RUE Assessment: Exceptions to [facility] General Strength Comments: Shoulder flexion 3/5, elbow flexion/extension 3/5, wrist extension 2+/5,digit flexion 90% of full with digit extension approximately 30% of full. LUE Assessment LUE Assessment: Within Functional Limits General Strength Comments: Grossly 4+/5   Ailene Royal OTR/L 07/25/2020, 4:39 PM

## 2020-07-25 NOTE — Progress Notes (Signed)
Physical Therapy Session Note  Patient Details  Name: Sharon Gilbert MRN: [id number] Date of Birth: [birth year]  Today's Date: 07/25/2020 PT Individual Time: 1300-1345 PT Individual Time Calculation (min): 45 min   Short Term Goals: Week 1:  PT Short Term Goal 1 (Week 1): Pt will perform bed mobility with CGA and no bed features PT Short Term Goal 2 (Week 1): Pt will perform bed<>chair transfers with LRAD and CGA PT Short Term Goal 3 (Week 1): Pt will ambulate at least 127ft with CGA and LRAD PT Short Term Goal 4 (Week 1): Pt will navigate up/down at least x4 steps with CGA and LRAD Week 2:  PT Short Term Goal 1 (Week 2): STG = LTG due to ELOS  Skilled Therapeutic Interventions/Progress Updates:    pt received in recliner and agreeable to therapy. Pt directed in STS from recliner to FWW CGA and gait training with FWW for 100' to gym CGA and stair training with one hand rail x4 steps CGA ascending and descending with reciprocal steps ascending and step descending. Pt then directed in gait training with FWW for 75' at [facility] with VC for increased stride length. Pt directed in simulated car transfer transfer at SBA with good safety awareness and ascending/desending ramp with FWW CGA. Pt directed in gait training with FWW for 50' to apartment and directed in furniture transfer training at supervision, and bed mobility from standard bed supervision. Pt directed in gait training without AD for 300' CGA with VC for increased step length on RLE. Pt also directed in NMRE on foam pad for reaching activity in various directions for improved balance righting and decreased fall risk. Pt directed in gait training 100' no AD back to room, family present, pt left in recliner, All needs in reach and in good condition. Call light in hand.    Therapy Documentation Precautions:  Precautions Precautions: Fall Precaution Comments: R HB weakness (RUE>RLE), expressive > receptive aphasia Restrictions Weight Bearing  Restrictions: No General:   Vital Signs: Therapy Vitals Temp: 98.6 F (37 C) Pulse Rate: 73 Resp: 17 BP: (!) 118/48 Patient Position (if appropriate): Sitting Oxygen Therapy SpO2: 100 % O2 Device: Room Air Pain:   Mobility: Bed Mobility Bed Mobility: Rolling Right;Rolling Left;Supine to Sit;Sit to Supine Rolling Right: Independent Rolling Left: Independent Supine to Sit: Independent Sit to Supine: Independent Transfers Transfers: Sit to Stand;Stand to Sit;Stand Pivot Transfers Sit to Stand: Independent Stand to Sit: Independent Stand Pivot Transfers: Independent Transfer (Assistive device): None Locomotion : Gait Ambulation: Yes Gait Assistance: Supervision/Verbal cueing Gait Distance (Feet): 150 Feet Assistive device: None Gait Assistance Details: Verbal cues for precautions/safety;Verbal cues for gait pattern Gait Gait: Yes Gait Pattern: Impaired Gait Pattern: Decreased step length - right;Decreased step length - left;Decreased hip/knee flexion - right;Decreased weight shift to right Gait velocity: 0.8 m/s High Level Ambulation High Level Ambulation: Backwards walking;Heel walking Backwards Walking: 2x85ft with CGA for safety Heel Walking: 2x76ft with CGA for safety Stairs / Additional Locomotion Stairs: Yes Stairs Assistance: Supervision/Verbal cueing Stair Management Technique: Two rails Number of Stairs: 12 Height of Stairs: 6 Ramp: Supervision/Verbal cueing Wheelchair Mobility Wheelchair Mobility: No  Trunk/Postural Assessment : Cervical Assessment Cervical Assessment: Within Functional Limits Thoracic Assessment Thoracic Assessment: Within Functional Limits Lumbar Assessment Lumbar Assessment: Within Functional Limits Postural Control Postural Control: Within Functional Limits  Balance: Balance Balance Assessed: Yes Static Sitting Balance Static Sitting - Balance Support: Feet supported Static Sitting - Level of Assistance: 7:  Independent Dynamic Sitting Balance Dynamic  Sitting - Balance Support: Feet supported Dynamic Sitting - Level of Assistance: 7: Independent Static Standing Balance Static Standing - Balance Support: During functional activity Static Standing - Level of Assistance: 7: Independent    Therapy/Group: Individual Therapy  Barbaraann Faster 07/25/2020, 3:48 PM

## 2020-07-25 NOTE — Progress Notes (Signed)
[place] PHYSICAL MEDICINE & REHABILITATION PROGRESS NOTE  Subjective/Complaints: Patient seen sitting up in her chair this morning working with therapies.  She indicates she slept well overnight.  Discussed aphasia with therapies, expressive deficits show some improvement, receptive deficits remain persistent.  ROS: limited due to aphasia  Objective: Vital Signs: Blood pressure (!) 142/63, pulse 83, temperature 98.4 F (36.9 C), resp. rate 17, height 5\' 5"  (1.651 m), weight 75.3 kg, SpO2 100 %. No results found. Recent Labs    07/23/20 0706  WBC 7.1  HGB 10.9*  HCT 33.7*  PLT 456*   Recent Labs    07/23/20 0706  NA 139  K 3.9  CL 105  CO2 22  GLUCOSE 120*  BUN 9  CREATININE 0.82  CALCIUM 9.2    Intake/Output Summary (Last 24 hours) at 07/25/2020 07/27/2020 Last data filed at 07/25/2020 0800 Gross per 24 hour  Intake 580 ml  Output --  Net 580 ml        Physical Exam: BP (!) 142/63 (BP Location: Left Arm)   Pulse 83   Temp 98.4 F (36.9 C)   Resp 17   Ht 5\' 5"  (1.651 m)   Wt 75.3 kg   SpO2 100%   BMI 27.62 kg/m  Constitutional: No distress . Vital signs reviewed. HENT: Normocephalic.  Atraumatic. Eyes: EOMI. No discharge. Cardiovascular: No JVD.  RRR. Respiratory: Normal effort.  No stridor.  Bilateral clear to auscultation. GI: Non-distended.  BS +. Skin: Warm and dry.  Intact. Psych: Normal mood.  Normal behavior. Musc: No edema in extremities.  No tenderness in extremities. Neuro: Alert Global aphasia - expressive aphasia, improving Motor: RUE: Shoulder abduction, elbow flexion/extension 4/5, wrist extension 3/5, 3/5 RIght finger flexors RLE: 4+/5 proximal to distal  Assessment/Plan: 1. Functional deficits secondary to acute ischemic left ICA stroke which require 3+ hours per day of interdisciplinary therapy in a comprehensive inpatient rehab setting.  Physiatrist is providing close team supervision and 24 hour management of active medical  problems listed below.  Physiatrist and rehab team continue to assess barriers to discharge/monitor patient progress toward functional and medical goals   Care Tool:  Bathing    Body parts bathed by patient: Right arm, Chest, Abdomen, Front perineal area, Buttocks, Right upper leg, Left upper leg, Face, Right lower leg, Left lower leg, Left arm   Body parts bathed by helper: Left arm     Bathing assist Assist Level: Supervision/Verbal cueing     Upper Body Dressing/Undressing Upper body dressing   What is the patient wearing?: Pull over shirt    Upper body assist Assist Level: Minimal Assistance - Patient > 75%    Lower Body Dressing/Undressing Lower body dressing      What is the patient wearing?: Pants, Incontinence brief     Lower body assist Assist for lower body dressing: Moderate Assistance - Patient 50 - 74%     Toileting Toileting    Toileting assist Assist for toileting: Minimal Assistance - Patient > 75%     Transfers Chair/bed transfer  Transfers assist     Chair/bed transfer assist level: Independent     Locomotion Ambulation   Ambulation assist      Assist level: Supervision/Verbal cueing Assistive device: No Device Max distance: 50'   Walk 10 feet activity   Assist     Assist level: Contact Guard/Touching assist Assistive device: No Device   Walk 50 feet activity   Assist    Assist level: Minimal Assistance -  Patient > 75% Assistive device: No Device    Walk 150 feet activity   Assist Walk 150 feet activity did not occur: Safety/medical concerns (fatigue)  Assist level: Minimal Assistance - Patient > 75% Assistive device: No Device    Walk 10 feet on uneven surface  activity   Assist Walk 10 feet on uneven surfaces activity did not occur: Safety/medical concerns         Wheelchair     Assist Will patient use wheelchair at discharge?: No             Wheelchair 50 feet with 2 turns  activity    Assist            Wheelchair 150 feet activity     Assist          Medical Problem List and Plan: 1.    Global aphasia, right hemiparesis with impaired mobility and ADLs secondary to acute ischemic left ICA stroke  Continue CIR  WHO nightly  Team conference today to discuss current and goals and coordination of care, home and environmental barriers, and discharge planning with nursing, case manager, and therapies. Please see conference note from today as well.  2.  L-ICA stent/Antithrombotics: -DVT/anticoagulation:  Pharmaceutical: Lovenox 40mg  q24H.              -antiplatelet therapy: On ASA/Brilinta 3. Pain Management:   Muscle rub as needed  Controlled on 10/20 4. Mood: LCSW to follow for evaluation and support.              -antipsychotic agents: N/A 5. Neuropsych: This patient is not fully capable of making decisions on her own behalf. 6. Skin/Wound Care: Routine pressure relief measures.  7. Fluids/Electrolytes/Nutrition: Monitor I/Os.   Dietitian consulted per son request for education on anti-inflammatory diet. 8. HTN: Monitor BP, high graded R-ICA stenosis. Per neurology SBP goals 130-150 range.   Lisinopril decreased on 10/11  HCTZ daily DC'd on 10/9   Vitals:   07/24/20 2041 07/25/20 0528  BP:  (!) 142/63  Pulse:  83  Resp:  17  Temp: 98.4 F (36.9 C) 98.4 F (36.9 C)  SpO2:  100%   Within acceptable range on 10/20 9. T2DM- diet controlled?: Hgb A1C-6.1. Will monitor BS ac/hs and use SSI for elevated BS as indicated.   Relatively controlled on 10/20  Will consider changes to carb modified diet when on regular textures-see #15  Monitor with increased mobility 10. Dyslipidemia: Zocor 80 mg 11. Intermittent Hypokalemia:   Potassium 3.9 on 10/18  Continue to monitor 12. ABLA:   Hemoglobin 10.9 on 10/18  Continue to monitor 13. Glaucoma: Stable on Xalatan and Cosopt.   14. Overweight (BMI 29.53): provide dietary counseling 15.  Post  stroke dysphagia  D3 thins, advance diet as tolerated 16.  Mild transaminitis: Resolved  ALT within normal limits on 9/11 17.  Slow transit constipation  Meds increased on 10/19  Improving   LOS: 14 days A FACE TO FACE EVALUATION WAS PERFORMED  Rebacca Votaw 11/19 07/25/2020, 9:22 AM

## 2020-07-25 NOTE — Discharge Summary (Signed)
Physical Therapy Discharge Summary  Patient Details  Name: Sharon Gilbert MRN: [id number] Date of Birth: July 18, 1941  Today's Date: 07/25/2020 PT Individual Time: 1015-1100 PT Individual Time Calculation (min): 45 min   Patient has met 10 of 10 long term goals due to improved activity tolerance, improved balance, improved postural control, increased strength, ability to compensate for deficits, improved attention, improved awareness and improved coordination.  Patient to discharge at an ambulatory level Supervision.   Patient's care partner is independent to provide the necessary physical and cognitive assistance at discharge.  Reasons goals not met: n/a  Recommendation:  Patient will benefit from ongoing skilled PT services in home health setting to continue to advance safe functional mobility, address ongoing impairments in RUE weakness, balance impairments, gait deficits in order to minimize fall risk.  Equipment: No equipment provided  Reasons for discharge: treatment goals met and discharge from hospital  Patient/family agrees with progress made and goals achieved: Yes  PT Discharge Precautions/Restrictions Precautions Precautions: Fall Precaution Comments: R HB weakness (RUE>RLE), expressive > receptive aphasia Restrictions Weight Bearing Restrictions: No Vision/Perception  Vision - Assessment Eye Alignment: Within Functional Limits Ocular Range of Motion: Within Functional Limits Perception Perception: Impaired Comments: Mild R inattention Praxis Praxis: Intact  Cognition Overall Cognitive Status: Impaired/Different from baseline Arousal/Alertness: Awake/alert Orientation Level: Oriented to person;Oriented to situation;Oriented to place;Other (comment) (Limited by aphasia) Attention: Sustained;Focused Focused Attention: Appears intact Focused Attention Impairment: Functional basic Sustained Attention: Appears intact Sustained Attention Impairment: Functional  basic Memory: Impaired Memory Impairment: Decreased recall of new information;Decreased short term memory (Difficulty to assess due to significant aphasia) Decreased Short Term Memory: Functional basic Problem Solving: Impaired Problem Solving Impairment: Functional basic Safety/Judgment: Appears intact Sensation Sensation Light Touch: Appears Intact Hot/Cold: Not tested Proprioception: Impaired by gross assessment Stereognosis: Not tested Coordination Gross Motor Movements are Fluid and Coordinated: Yes Fine Motor Movements are Fluid and Coordinated: No Coordination and Movement Description: RUE weakness, no overt dysmetria or dyscoordination Finger Nose Finger Test: LUE WFL. RUE limited by digit and wrist extension Heel Shin Test: [facility] Motor  Motor Motor: Hemiplegia Motor - Skilled Clinical Observations: RUE > RLE weakness.  Mobility Bed Mobility Bed Mobility: Rolling Right;Rolling Left;Supine to Sit;Sit to Supine Rolling Right: Independent Rolling Left: Independent Supine to Sit: Independent Sit to Supine: Independent Transfers Transfers: Sit to Stand;Stand to Sit;Stand Pivot Transfers Sit to Stand: Independent Stand to Sit: Independent Stand Pivot Transfers: Independent Transfer (Assistive device): None Locomotion  Gait Ambulation: Yes Gait Assistance: Supervision/Verbal cueing Gait Distance (Feet): 150 Feet Assistive device: None Gait Assistance Details: Verbal cues for precautions/safety;Verbal cues for gait pattern Gait Gait: Yes Gait Pattern: Impaired Gait Pattern: Decreased step length - right;Decreased step length - left;Decreased hip/knee flexion - right;Decreased weight shift to right Gait velocity: 0.8 m/s High Level Ambulation High Level Ambulation: Backwards walking;Heel walking Backwards Walking: 2x39f with CGA for safety Heel Walking: 2x325fwith CGA for safety Stairs / Additional Locomotion Stairs: Yes Stairs Assistance: Supervision/Verbal  cueing Stair Management Technique: Two rails Number of Stairs: 12 Height of Stairs: 6 Ramp: Supervision/Verbal cueing Wheelchair Mobility Wheelchair Mobility: No  Trunk/Postural Assessment  Cervical Assessment Cervical Assessment: Within Functional Limits Thoracic Assessment Thoracic Assessment: Within Functional Limits Lumbar Assessment Lumbar Assessment: Within Functional Limits Postural Control Postural Control: Within Functional Limits  Balance Balance Balance Assessed: Yes Static Sitting Balance Static Sitting - Balance Support: Feet supported Static Sitting - Level of Assistance: 7: Independent Dynamic Sitting Balance Dynamic Sitting - Balance Support: Feet supported Dynamic  Sitting - Level of Assistance: 7: Independent Static Standing Balance Static Standing - Balance Support: During functional activity Static Standing - Level of Assistance: 7: Independent Extremity Assessment  RUE Assessment RUE Assessment: Exceptions to [facility] - shldr 4/5, bicep 4/5, tricep 4/5, wrist ext 3-/5 LUE Assessment LUE Assessment: Within Functional Limits General Strength Comments: Grossly 4+/5 RLE Assessment RLE Assessment: Within Functional Limits General Strength Comments: Grossly 4+/5 LLE Assessment LLE Assessment: Within Functional Limits General Strength Comments: Grossly 5/5  Skilled Intervention: Pt received sitting in recliner, agreeable to PT session, no c/o pain. Sit<>stand indep from recliner height with no AD. She ambulated 171f supervision with no AD from her room to ortho gym. Performed car transfer with supervision and ambulated up/dwon ~117framp with supervision and no AD. She ambulated with supervision and no AD to ADL apartment room, performed stand>sit indep, sit>supine indep on regular flat bed and able to roll L<>R indep without bed features. Sit<>stand indep from bed level height and ambulated ~10037fith supervision to main therapy gym where she negotiated up/down  x12 steps with supervision and B HR support, reciprocal pattern. She then performed standing balance on Biodex, performing both Limits of Stability setting and Random Control Training. She performed both of these with minA guard to facilitate sagital and frontal plane weight shifting, difficulty with aphasia limiting and increased difficulty with RLE weight shift although no formal LOB or knee buckling. She then ambulated ~150f60fth supervision and no AD to ortho gym and reviewed HEP and printout provided. HEP listed below. She then performed higher level gait, including tandem walking 2x30ft68f tandem backward walking 2x30ft 75f therapist providing CGA.. AmbuMarland Kitchenated back to her room with supervision and stand>sit indep to recliner. She finished session seated in recliner with belt alarm on, needs in reach.  Access Code: [email or web address] Date: 07/25/2020 Prepared by: ChristGinnie Smartcises Standing Tandem Balance with Counter Support - 1 x daily - 7 x weekly - 3 sets - 10 reps Squat with Counter Support - 1 x daily - 7 x weekly - 3 sets - 10 reps Standing Hip Abduction with Counter Support - 1 x daily - 7 x weekly - 3 sets - 10 reps Bilateral Long Arc Quad - 1 x daily - 7 x weekly - 3 sets - 10 reps Seated March - 1 x daily - 7 x weekly - 3 sets - 10 reps   ChristMarriottPT 07/25/2020, 12:16 PM

## 2020-07-25 NOTE — Plan of Care (Signed)
Nutrition Education Note  RD consulted for nutrition education. Pt's son with questions about pt's diet on discharge.  Spoke with pt's son in Family Room. Pt with SLP at time of visit.  Pt's son reports that pt will be living with two family members on discharge. He states that her caregivers are not necessarily "healthy" eaters. He is worried that they will just give her microwave meals. Pt requesting handouts that he can provide to pt's caregivers.  Lipid Panel     Component Value Date/Time   CHOL 275 (H) 07/04/2020 0738   TRIG 112 07/06/2020 0547   HDL 46 07/04/2020 0738   CHOLHDL 6.0 07/04/2020 0738   VLDL 27 07/04/2020 0738   LDLCALC 202 (H) 07/04/2020 0738    RD provided "Heart Healthy Consistent Carbohydrate Nutrition Therapy" handout from the Academy of [employer] and Dietetics. Provided examples on ways to decrease sodium and fat intake in pt's diet. Discouraged intake of processed foods and use of salt shaker. Encouraged fresh fruits and vegetables as well as whole grain sources of carbohydrates to maximize fiber intake. Teach back method used.  Discussed different food groups and their effects on blood sugar, emphasizing carbohydrate-containing foods. Provided list of carbohydrates and recommended serving sizes of common foods.  Discussed importance of controlled and consistent carbohydrate intake throughout the day. Provided examples of ways to balance meals/snacks and encouraged intake of high-fiber, whole grain complex carbohydrates. Teach back method used.  Expect fair to good compliance.  Body mass index is 27.62 kg/m. Pt meets criteria for overweight based on current BMI.  Current diet order is Dysphagia 3, patient is consuming approximately 60% of meals at this time. Labs and medications reviewed. No further nutrition interventions warranted at this time. RD contact information provided. If additional nutrition issues arise, please re-consult RD.   Earma Reading, MS, RD, LDN Inpatient Clinical Dietitian Please see AMiON for contact information.

## 2020-07-25 NOTE — Progress Notes (Signed)
Speech Language Pathology Discharge Summary  Patient Details  Name: Sharon Gilbert MRN: [id number] Date of Birth: May 20, 1941  Today's Date: 10/20/201 SLP Individual Time:  805-900     Skilled Therapeutic Interventions: Skilled ST services focused on education and language skills. SLP facilitated assessment of language skills administering WAB. Pt demonstrated response to simple yes/no questions with 80% accuracy, mildly complex yes/no questions with 40% accuracy, spontaneous speech 20% accuracy (suspect largely due to apraxia), following 1 step commands 60% accuracy, identifying objects in black/white drawings in a field of 4 80% accuracy, identifying actions in black/white drawings 80% accuracy, pt was unable to name items in room nor repeat at word level due to apraxia/phonemic errors. Pt demonstrated ability to identify ADL item/actions on communication board in a field of 4-6 with 100% accuracy. Pt's son was present for education and education was provided, handout given for aphasia and apraxia. All questions answered to satisfaction. Pt was left in room with call bell within reach and chair alarm set. SLP recommends to continue skilled services.    Patient has met 3 of 4 long term goals.  Patient to discharge at overall Modified Independent;Supervision;Min;Mod level.  Reasons goals not met: severe to moderate apraxia   Clinical Impression/Discharge Summary:   Pt made great progress meeting 3 out 4 goals. Pt was upgraded to dys 3 textures and thin liquid diet mod I for swallow strategies with set up assist. Pt's severe-moderate apraxia limited her from meeting expressive communication goal, but has improved over the course of stay. Pt is able to produce all vowels and favors initial /l/ speech sounds, but can be shaped in CV and CVC to initial /b/, /m/, /t/, /n/ and /d/ words. Detailed information on language skills are listed above in treatment note. Pt is able to make needs know with gestures  and response to yes/no questions with min A. Pt required supervision A verbal cues for problem solving and needs assistance in error awareness. Pt would continue benefit from skilled ST services in order to maximize functional independence and reduce burden of care, requiring supervision at discharge with continued skilled ST services.  Care Partner:  Caregiver Able to Provide Assistance: Yes  Type of Caregiver Assistance: Cognitive;Physical  Recommendation:  Home Health SLP;24 hour supervision/assistance  Rationale for SLP Follow Up: Maximize functional communication;Maximize cognitive function and independence;Reduce caregiver burden;Maximize swallowing safety   Equipment: N/A   Reasons for discharge: Discharged from hospital   Patient/Family Agrees with Progress Made and Goals Achieved: Yes    Kailo Kosik  [facility] 07/26/2020, 6:05 AM

## 2020-07-26 LAB — GLUCOSE, CAPILLARY: Glucose-Capillary: 98 mg/dL (ref 70–99)

## 2020-07-26 MED ORDER — POLYETHYLENE GLYCOL 3350 17 G PO PACK: 17.0000 g | PACK | Freq: Two times a day (BID) | ORAL | 0 refills | Status: DC

## 2020-07-26 MED ORDER — LISINOPRIL 10 MG PO TABS: 10.0000 mg | ORAL_TABLET | Freq: Every day | ORAL | 0 refills | Status: DC

## 2020-07-26 MED ORDER — ATORVASTATIN CALCIUM 80 MG PO TABS: 80.0000 mg | ORAL_TABLET | Freq: Every day | ORAL | 0 refills | Status: AC

## 2020-07-26 MED ORDER — SENNOSIDES-DOCUSATE SODIUM 8.6-50 MG PO TABS: 1.0000 | ORAL_TABLET | Freq: Every day | ORAL | 0 refills | Status: DC

## 2020-07-26 MED ORDER — TICAGRELOR 90 MG PO TABS: 90.0000 mg | ORAL_TABLET | Freq: Two times a day (BID) | ORAL | 0 refills | Status: DC

## 2020-07-26 NOTE — Discharge Summary (Addendum)
Physician Discharge Summary  Patient ID: Sharon Gilbert MRN: [id number] DOB/AGE: 1941-05-01 79 y.o.  Admit date: 07/11/2020 Discharge date: 07/26/2020  Discharge Diagnoses:  Principal Problem:   Acute ischemic left ICA stroke (HCC) d/t LAA R ICA Active Problems:   Dysphagia, post-stroke   Acute blood loss anemia   Controlled type 2 diabetes mellitus with complication, without long-term current use of insulin (HCC)   Stenosis of right internal carotid artery   History of hypertension   Slow transit constipation   Dyslipidemia   Right hemiparesis (HCC)   Discharged Condition: Stable  Significant Diagnostic Studies: N/a   Labs:  Basic Metabolic Panel: Recent Labs  Lab 07/20/20 0440 07/23/20 0706  NA 136 139  K 3.5 3.9  CL 104 105  CO2 22 22  GLUCOSE 112* 120*  BUN 12 9  CREATININE 0.75 0.82  CALCIUM 8.9 9.2    CBC: CBC Latest Ref Rng & Units 07/23/2020 07/16/2020 07/12/2020  WBC 4.0 - 10.5 K/uL 7.1 7.4 6.3  Hemoglobin 12.0 - 15.0 g/dL 10.9(L) 10.6(L) 10.9(L)  Hematocrit 36 - 46 % 33.7(L) 33.0(L) 33.2(L)  Platelets 150 - 400 K/uL 456(H) 427(H) 375    CBG: Recent Labs  Lab 07/25/20 0605 07/25/20 1149 07/25/20 1655 07/25/20 2045 07/26/20 0610  GLUCAP 99 90 102* 88 98    Brief HPI:   Sharon Gilbert is a 79 y.o. RH-female with history of HTN, T2DM--diet controlled?, Glaucoma; who was admitted on 07/03/2020 with slurred speech. CTA head/neck showed occlusive stenosis of proximal right ICA with string sign, left left ICA occlusion beyond origin and throughout the neck. CT perfusion showed 31 mL penumbra without core infarct. Follow-up MRI brain revealed multiple small infarcts in left frontal and parietal lobe. She developed right-sided weakness overnight with onset of aphasia and underwent cerebral angio with complete revascularization of proximal left ICA with stent angioplasty and complete revascularization of left-MCA M1 occlusion.   Follow-up CT head showed  mild to moderate SAH due to hemorrhagic conversion of infarct and she was kept intubated through 09/30. Dr. Roda Shutters felt that stroke was due to atherosclerotic embolism and to continue ASA/Brilinta as well as high-dose statin. Dr. Corliss Skains to follow-up on R-ICA stenosis and long-term blood pressure goals at one 30-1 50 range given ICA stenosis. Patient with resultant right facial droop with expressive deficits, difficulty following multistep commands, dysphagia as well as right upper extremity weakness affecting ADLs and mobility. CIR was recommended due to functional decline.   Hospital Course: Sharon Gilbert was admitted to rehab 07/11/2020 for inpatient therapies to consist of PT, ST and OT at least three hours five days a week. Past admission physiatrist, therapy team and rehab RN have worked together to provide customized collaborative inpatient rehab. Her blood pressures were monitored on TID basis with SBP goals of one 30-1 50 range. HCTZ was DC'd and lisinopril was decreased to avoid hypoperfusion. Hemoglobin A1c at 6.1 and she was educated on carb modified restriction for prediabetes versus T2DM. Dietitian has educated patient and son on anti-inflammatory/heart healthy diet. She continues on aspirin and Brilinta and is tolerating this without side effects. Follow-up CBC shows H&H and platelets to be stable.   Check of electrolytes showed renal status to be within normal limits and hypokalemia had resolved. Follow-up labs showed mild transaminitis has resolved. Bowel program was augmented to help manage slow transit constipation. She did report discomfort due to irritated sebaceous cyst on her left neck. Warm compresses were used to this area and  sebum expressed.  Patient and family were advised to continue doing warm compresses with attempts to express drainage as able. She has made good gains during her rehab stay however continues to be limited by right-sided weakness with aphasia, apraxia and right  inattention. 24 supervision is recommended for safety. She will continue to receive follow-up home health PT, OT and ST by [facility] after discharge.    Rehab course: During patient's stay in rehab weekly team conferences were held to monitor patient's progress, set goals and discuss barriers to discharge. At admission, patient required min assist with basic ADL tasks and with mobility. She exhibited severe expressive aphasia and mild to moderate receptive aphasia with mild cognitive impairment. She  has had improvement in activity tolerance, balance, postural control as well as ability to compensate for deficits. She requires supervision to mod assist to complete ADL tasks.She requires supervision for transfers and mobility. She requires contact-guard assist with stair navigation. She is able to answer simple yes/no questions with 80% accuracy and mildly comple yes/no questions with 40% accuracy. He is unable to name items at word level apraxia and thalamic areas. She continues to require supervision to mod assist due to severe to moderate apraxia. Family education has been done with son regarding all aspects of safety, care and communication.  Discharge disposition: 01-Home or Self Care  Diet: Dysphagia 3 textures with heart healthy/carb modified restrictions  Special Instructions: 1. No strenuous activity or driving 2. Systolic blood pressure goal 130-150 range due to ICA stenosis.Marland Kitchen   Discharge Instructions     Ambulatory referral to Interventional Radiology   Complete by: As directed    Follow up on R-ICA stenosis--to be discharged 10/21   Ambulatory referral to Physical Medicine Rehab   Complete by: As directed    1-2 weeks TC appt      Allergies as of 07/26/2020       Reactions   Lipitor [atorvastatin] Other (See Comments)   Muscle pain   Topiramate Other (See Comments)   Vomiting & Wheezing        Medication List     STOP taking these medications     ciclopirox 8 % solution Commonly known as: PENLAC   enoxaparin 40 MG/0.4ML injection Commonly known as: LOVENOX   lisinopril-hydrochlorothiazide 20-12.5 MG tablet Commonly known as: ZESTORETIC       TAKE these medications    acetaminophen 325 MG tablet Commonly known as: TYLENOL Take 1-2 tablets (325-650 mg total) by mouth every 4 (four) hours as needed for mild pain. What changed:  how much to take reasons to take this   aspirin 81 MG chewable tablet Chew 1 tablet (81 mg total) by mouth daily. Notes to patient: You need to purchase this over the counter.--coated aspirin/store brand is ok.    atorvastatin 80 MG tablet Commonly known as: LIPITOR Take 1 tablet (80 mg total) by mouth daily at 6 PM.   dorzolamide-timolol 22.3-6.8 MG/ML ophthalmic solution Commonly known as: COSOPT Place 1 drop into both eyes 2 (two) times daily.   latanoprost 0.005 % ophthalmic solution Commonly known as: XALATAN Place 1 drop into both eyes at bedtime.   lisinopril 10 MG tablet Commonly known as: ZESTRIL Take 1 tablet (10 mg total) by mouth daily. Start taking on: July 27, 2020 Notes to patient: For blood pressure   Muscle Rub 10-15 % Crea Apply 1 application topically 2 (two) times daily. Notes to patient: Apply to right shoulder and back--purchase this over the  counter   polyethylene glycol 17 g packet Commonly known as: MIRALAX / GLYCOLAX Take 17 g by mouth 2 (two) times daily. Notes to patient: For constipation   senna-docusate 8.6-50 MG tablet Commonly known as: Senokot-S Take 1 tablet by mouth at bedtime. Notes to patient: For  constipation   ticagrelor 90 MG Tabs tablet Commonly known as: BRILINTA Take 1 tablet (90 mg total) by mouth 2 (two) times daily.        Follow-up Information     Pearson Grippe, MD Follow up.   Specialty: Internal Medicine Why: for post hospital follow up Contact information: [address] [place]  [address] [phone]         Marcello Fennel, MD Follow up.   Specialty: Physical Medicine and Rehabilitation Why: Office will call you with follow up appointment Contact information: [address] [place] [address] [phone]         Julieanne Cotton, MD Follow up.   Specialties: Interventional Radiology, Radiology Why: Office will call you with follow up appointment--call if you have not heard from them by next week.  Contact information: [address] [place] [address] [phone]         Micki Riley, MD Follow up on 09/04/2020.   Specialties: Neurology, Radiology Why: Appointment at 3:30 pm Contact information: [address] [place] [address] [phone]                 Signed: Jacquelynn Cree 07/26/2020, 5:10 PM Patient was seen, face-face, and physical exam performed by me on day of discharge, greater than 30 minutes of total time spent.. Please see progress note from day of discharge as well.  Maryla Morrow, MD, ABPMR

## 2020-07-26 NOTE — Progress Notes (Signed)
[facility]IC[facility] RE[facility]ABILITATION PROGRESS NOTE  Subjective/Complaints: Patient seen sitting up in her chair this morning.  No reported issues overnight.  She appears to be aware of plan for discharge.  ROS: limited due to aphasia  Objective: Vital Signs: Blood pressure 131/61, pulse 73, temperature 98.6 F (37 C), resp. rate 16, height 5\' 5"  (1.651 m), weight 75.6 kg, SpO2 100 %. No results found. No results for input(s): WBC, HGB, HCT, PLT in the last 72 hours. No results for input(s): NA, K, CL, CO2, GLUCOSE, BUN, CREATININE, CALCIUM in the last 72 hours.  Intake/Output Summary (Last 24 hours) at 07/26/2020 1247 Last data filed at 07/26/2020 0800 Gross per 24 hour  Intake 600 ml  Output --  Net 600 ml        Physical Exam: BP 131/61 (BP Location: Left Arm)   Pulse 73   Temp 98.6 F (37 C)   Resp 16   Ht 5\' 5"  (1.651 m)   Wt 75.6 kg   SpO2 100%   BMI 27.73 kg/m  Constitutional: No distress . Vital signs reviewed. HENT: Normocephalic.  Atraumatic. Eyes: EOMI. No discharge. Cardiovascular: No JVD.  RRR. Respiratory: Normal effort.  No stridor.  Bilateral clear to auscultation. GI: Non-distended.  BS +. Skin: Warm and dry.  Intact. Psych: Normal mood.  Normal behavior. Musc: No edema in extremities.  No tenderness in extremities. Neuro: Alert Global aphasia - expressive aphasia, stable Motor: RUE: Shoulder abduction, elbow flexion/extension 4/5, wrist extension 3/5, 3/5 RIght finger flexors, improving RLE: 4+/5 proximal to distal  Assessment/Plan: 1. Functional deficits secondary to acute ischemic left ICA stroke which require 3+ hours per day of interdisciplinary therapy in a comprehensive inpatient rehab setting.  Physiatrist is providing close team supervision and 24 hour management of active medical problems listed below.  Physiatrist and rehab team continue to assess barriers to discharge/monitor patient progress toward functional and medical  goals   Care Tool:  Bathing    Body parts bathed by patient: Right arm, Chest, Abdomen, Front perineal area, Buttocks, Right upper leg, Left upper leg, Face, Right lower leg, Left lower leg, Left arm   Body parts bathed by helper: Left arm     Bathing assist Assist Level: Supervision/Verbal cueing     Upper Body Dressing/Undressing Upper body dressing   What is the patient wearing?: Pull over shirt    Upper body assist Assist Level: Supervision/Verbal cueing    Lower Body Dressing/Undressing Lower body dressing      What is the patient wearing?: Pants, Incontinence brief     Lower body assist Assist for lower body dressing: Moderate Assistance - Patient 50 - 74%     Toileting Toileting    Toileting assist Assist for toileting: Supervision/Verbal cueing     Transfers Chair/bed transfer  Transfers assist     Chair/bed transfer assist level: Independent     Locomotion Ambulation   Ambulation assist      Assist level: Supervision/Verbal cueing Assistive device: No Device Max distance: >274ft   Walk 10 feet activity   Assist     Assist level: Supervision/Verbal cueing Assistive device: No Device   Walk 50 feet activity   Assist    Assist level: Supervision/Verbal cueing Assistive device: No Device    Walk 150 feet activity   Assist Walk 150 feet activity did not occur: Safety/medical concerns (fatigue)  Assist level: Supervision/Verbal cueing Assistive device: No Device    Walk 10 feet on uneven surface  activity   Assist Walk 10 feet on uneven surfaces activity did not occur: Safety/medical concerns   Assist level: Supervision/Verbal cueing     Wheelchair     Assist Will patient use wheelchair at discharge?: No             Wheelchair 50 feet with 2 turns activity    Assist            Wheelchair 150 feet activity     Assist          Medical Problem List and Plan: 1.    Global aphasia, right  hemiparesis with impaired mobility and ADLs secondary to acute ischemic left ICA stroke  WHO nightly  DC today  Will see patient for transitional care management in 1-2 weeks post-discharge 2.  L-ICA stent/Antithrombotics: -DVT/anticoagulation:  Pharmaceutical: Lovenox 40mg  q24H.              -antiplatelet therapy: On ASA/Brilinta 3. Pain Management:   Muscle rub as needed  Controlled on 10/21 4. Mood: LCSW to follow for evaluation and support.              -antipsychotic agents: N/A 5. Neuropsych: This patient is not fully capable of making decisions on her own behalf. 6. Skin/Wound Care: Routine pressure relief measures.  7. Fluids/Electrolytes/Nutrition: Monitor I/Os.   Dietitian consulted per son request for education on anti-inflammatory diet. 8. HTN: Monitor BP, high graded R-ICA stenosis. Per neurology SBP goals 130-150 range.   Lisinopril decreased on 10/11  HCTZ daily DC'd on 10/9   Vitals:   07/25/20 1904 07/26/20 0500  BP: (!) 141/49 131/61  Pulse: 71 73  Resp: 16 16  Temp: 98.3 F (36.8 C) 98.6 F (37 C)  SpO2: 100% 100%   Controlled on 10/21 9. T2DM- diet controlled?: Hgb A1C-6.1. Will monitor BS ac/hs and use SSI for elevated BS as indicated.   Controlled on 10/21  Will consider changes to carb modified diet when on regular textures-see #15  Monitor with increased mobility 10. Dyslipidemia: Zocor 80 mg 11. Intermittent Hypokalemia:   Potassium 3.9 on 10/18  Continue to monitor 12. ABLA:   Hemoglobin 10.9 on 10/18  Continue to monitor 13. Glaucoma: Stable on Xalatan and Cosopt.   14. Overweight (BMI 29.53): provide dietary counseling 15.  Post stroke dysphagia  D3 thins 16.  Mild transaminitis: Resolved  ALT within normal limits on 9/11 17.  Slow transit constipation  Meds increased on 10/19  Improved  > 30 minutes spent in total in discharge planning between myself and PA regarding aforementioned, as well discussion regarding DME equipment, follow-up  appointments, follow-up therapies, discharge medications, discharge recommendations (please see PA note as well)   LOS: 15 days A FACE TO FACE EVALUATION WAS PERFORMED  Hulda Reddix 11/19 07/26/2020, 12:47 PM

## 2020-07-26 NOTE — Progress Notes (Signed)
Spent 45 minutes going over discharge instructions--medications, assistance needed with ADLs, mobility, medication management and meals prep. Family fees that she's in "imminent danger" of having another stroke and would like other sided stented "ASAP" --want it documented. Discussed stroke risks/prevention measures.  Relayed that neurology and Dr. Corliss Skains are to help make that decision and they can reach out to them.

## 2020-07-26 NOTE — Discharge Instructions (Signed)
Inpatient Rehab Discharge Instructions  Sharon Gilbert Discharge date and time: 07/26/20   Activities/Precautions/ Functional Status: Activity: no lifting, driving, or strenuous exercise till cleared by MD Diet: cardiac diet--has to be soft Wound Care: apply moist compress to cyst on left neck and can try to express it as able. Cover with Band-Aid.   Functional status:  ___ No restrictions     ___ Walk up steps independently _X__ 24/7 supervision/assistance   ___ Walk up steps with assistance ___ Intermittent supervision/assistance  ___ Bathe/dress independently ___ Walk with walker     ___ Bathe/dress with assistance ___ Walk Independently    ___ Shower independently ___ Walk with assistance    _X__ Shower with assistance _X__ No alcohol     ___ Return to work/school ________   Special Instructions:   COMMUNITY REFERRALS UPON DISCHARGE:    Home Health:   PT,OT, SP                 Agency:[facility] Phone:[phone]   Medical Equipment/Items Ordered:HOSPITAL BED & 3 IN 1                                                 Agency/Supplier:[facility] [phone]   STROKE/TIA DISCHARGE INSTRUCTIONS SMOKING Cigarette smoking nearly doubles your risk of having a stroke & is the single most alterable risk factor  If you smoke or have smoked in the last 12 months, you are advised to quit smoking for your health.  Most of the excess cardiovascular risk related to smoking disappears within a year of stopping.  Ask you doctor about anti-smoking medications  [place] Quit Line: 1-800-QUIT NOW  Free Smoking Cessation Classes [phone]  CHOLESTEROL Know your levels; limit fat & cholesterol in your diet  Lipid Panel     Component Value Date/Time   CHOL 275 (H) 07/04/2020 0738   TRIG 112 07/06/2020 0547   HDL 46 07/04/2020 0738   CHOLHDL 6.0 07/04/2020 0738   VLDL 27 07/04/2020 0738   LDLCALC 202 (H) 07/04/2020 0738      Many patients benefit from treatment even if  their cholesterol is at goal.  Goal: Total Cholesterol (CHOL) less than 160  Goal:  Triglycerides (TRIG) less than 150  Goal:  HDL greater than 40  Goal:  LDL (LDLCALC) less than 100   BLOOD PRESSURE American Stroke Association blood pressure target is less that 120/80 mm/Hg  Your discharge blood pressure is:  BP: (!) 122/51  Monitor your blood pressure  Limit your salt and alcohol intake  Many individuals will require more than one medication for high blood pressure  DIABETES (A1c is a blood sugar average for last 3 months) Goal HGBA1c is under 7% (HBGA1c is blood sugar average for last 3 months)  Diabetes: Pre-diabetic    Lab Results  Component Value Date   HGBA1C 6.1 (H) 07/04/2020     Your HGBA1c can be lowered with medications, healthy diet, and exercise.  Check your blood sugar as directed by your physician  Call your physician if you experience unexplained or low blood sugars.  PHYSICAL ACTIVITY/REHABILITATION Goal is 30 minutes at least 4 days per week  Activity: No driving, Therapies: see above Return to work: To be decided on follow up  Activity decreases your risk of heart attack and stroke and makes your heart  stronger.  It helps control your weight and blood pressure; helps you relax and can improve your mood.  Participate in a regular exercise program.  Talk with your doctor about the best form of exercise for you (dancing, walking, swimming, cycling).  DIET/WEIGHT Goal is to maintain a healthy weight  Your discharge diet is:  Diet Order            DIET DYS 3 Room service appropriate? Yes with Assist; Fluid consistency: Thin  Diet effective now                liquids Your height is:  Height: 5\' 5"  (165.1 cm) Your current weight is: 165 lbs Your Body Mass Index (BMI) is:  BMI (Calculated): 27.51  Following the type of diet specifically designed for you will help prevent another stroke.  Your goal weigh is:150 lbs  Your goal Body Mass Index (BMI)  is 19-24.  Healthy food habits can help reduce 3 risk factors for stroke:  High cholesterol, hypertension, and excess weight.  RESOURCES Stroke/Support Group:  Call [phone]   STROKE EDUCATION PROVIDED/REVIEWED AND GIVEN TO PATIENT Stroke warning signs and symptoms How to activate emergency medical system (call 911). Medications prescribed at discharge. Need for follow-up after discharge. Personal risk factors for stroke. Pneumonia vaccine given:  Flu vaccine given:  My questions have been answered, the writing is legible, and I understand these instructions.  I will adhere to these goals & educational materials that have been provided to me after my discharge from the hospital.     My questions have been answered and I understand these instructions. I will adhere to these goals and the provided educational materials after my discharge from the hospital.  Patient/Caregiver Signature _______________________________ Date __________  Clinician Signature _______________________________________ Date __________  Please bring this form and your medication list with you to all your follow-up doctor's appointments.

## 2020-07-27 DIAGNOSIS — H409 Unspecified glaucoma: Secondary | ICD-10-CM | POA: Diagnosis not present

## 2020-07-27 DIAGNOSIS — E785 Hyperlipidemia, unspecified: Secondary | ICD-10-CM | POA: Diagnosis not present

## 2020-07-27 DIAGNOSIS — I69351 Hemiplegia and hemiparesis following cerebral infarction affecting right dominant side: Secondary | ICD-10-CM | POA: Diagnosis not present

## 2020-07-27 DIAGNOSIS — I69390 Apraxia following cerebral infarction: Secondary | ICD-10-CM | POA: Diagnosis not present

## 2020-07-27 DIAGNOSIS — Z7982 Long term (current) use of aspirin: Secondary | ICD-10-CM | POA: Diagnosis not present

## 2020-07-27 DIAGNOSIS — I69398 Other sequelae of cerebral infarction: Secondary | ICD-10-CM | POA: Diagnosis not present

## 2020-07-27 DIAGNOSIS — R1310 Dysphagia, unspecified: Secondary | ICD-10-CM | POA: Diagnosis not present

## 2020-07-27 DIAGNOSIS — E119 Type 2 diabetes mellitus without complications: Secondary | ICD-10-CM | POA: Diagnosis not present

## 2020-07-27 DIAGNOSIS — I69322 Dysarthria following cerebral infarction: Secondary | ICD-10-CM | POA: Diagnosis not present

## 2020-07-27 DIAGNOSIS — I6521 Occlusion and stenosis of right carotid artery: Secondary | ICD-10-CM | POA: Diagnosis not present

## 2020-07-27 DIAGNOSIS — H9325 Central auditory processing disorder: Secondary | ICD-10-CM | POA: Diagnosis not present

## 2020-07-27 DIAGNOSIS — I10 Essential (primary) hypertension: Secondary | ICD-10-CM | POA: Diagnosis not present

## 2020-07-27 DIAGNOSIS — I69320 Aphasia following cerebral infarction: Secondary | ICD-10-CM | POA: Diagnosis not present

## 2020-07-27 DIAGNOSIS — I69391 Dysphagia following cerebral infarction: Secondary | ICD-10-CM | POA: Diagnosis not present

## 2020-07-27 DIAGNOSIS — I69392 Facial weakness following cerebral infarction: Secondary | ICD-10-CM | POA: Diagnosis not present

## 2020-07-27 NOTE — Progress Notes (Signed)
Patient discharged off of unit with all belongings. Discharge papers/instructions explained by physician assistant to family. Patient and family have no further questions at time of discharge. No complications noted at this time.  Rily Nickey L Deaundra Kutzer  

## 2020-07-27 NOTE — Plan of Care (Signed)
  Problem: Consults Goal: RH STROKE PATIENT EDUCATION Description: See Patient Education module for education specifics  Outcome: Completed/Met   Problem: RH BOWEL ELIMINATION Goal: RH STG MANAGE BOWEL WITH ASSISTANCE Description: STG Manage Bowel with min Assistance. Outcome: Completed/Met   Problem: RH BLADDER ELIMINATION Goal: RH STG MANAGE BLADDER WITH ASSISTANCE Description: STG Manage Bladder With min Assistance Outcome: Completed/Met   Problem: RH SAFETY Goal: RH STG ADHERE TO SAFETY PRECAUTIONS W/ASSISTANCE/DEVICE Description: STG Adhere to Safety Precautions With min Assistance/Device. Outcome: Completed/Met   Problem: RH COGNITION-NURSING Goal: RH STG USES MEMORY AIDS/STRATEGIES W/ASSIST TO PROBLEM SOLVE Description: STG Uses Memory Aids/Strategies With min Assistance to Problem Solve. Outcome: Completed/Met   Problem: RH KNOWLEDGE DEFICIT Goal: RH STG INCREASE KNOWLEDGE OF HYPERTENSION Description: Patient or family will be able to verbalize management of hypertension with cues/handout Outcome: Completed/Met Goal: RH STG INCREASE KNOWLEGDE OF HYPERLIPIDEMIA Description: Patient or family will be able to verbalize management of hyperlipidemia with cues/handout Outcome: Completed/Met Goal: RH STG INCREASE KNOWLEDGE OF STROKE PROPHYLAXIS Description: Patient or family will be able to verbalize management of stroke with cues/handout Outcome: Completed/Met

## 2020-07-30 ENCOUNTER — Telehealth: Payer: Self-pay | Admitting: Registered Nurse

## 2020-07-30 DIAGNOSIS — I69320 Aphasia following cerebral infarction: Secondary | ICD-10-CM | POA: Diagnosis not present

## 2020-07-30 DIAGNOSIS — E119 Type 2 diabetes mellitus without complications: Secondary | ICD-10-CM | POA: Diagnosis not present

## 2020-07-30 DIAGNOSIS — R1310 Dysphagia, unspecified: Secondary | ICD-10-CM | POA: Diagnosis not present

## 2020-07-30 DIAGNOSIS — H409 Unspecified glaucoma: Secondary | ICD-10-CM | POA: Diagnosis not present

## 2020-07-30 DIAGNOSIS — I6521 Occlusion and stenosis of right carotid artery: Secondary | ICD-10-CM | POA: Diagnosis not present

## 2020-07-30 DIAGNOSIS — Z7982 Long term (current) use of aspirin: Secondary | ICD-10-CM | POA: Diagnosis not present

## 2020-07-30 DIAGNOSIS — E785 Hyperlipidemia, unspecified: Secondary | ICD-10-CM | POA: Diagnosis not present

## 2020-07-30 DIAGNOSIS — I69351 Hemiplegia and hemiparesis following cerebral infarction affecting right dominant side: Secondary | ICD-10-CM | POA: Diagnosis not present

## 2020-07-30 DIAGNOSIS — I69392 Facial weakness following cerebral infarction: Secondary | ICD-10-CM | POA: Diagnosis not present

## 2020-07-30 DIAGNOSIS — I10 Essential (primary) hypertension: Secondary | ICD-10-CM | POA: Diagnosis not present

## 2020-07-30 NOTE — Telephone Encounter (Signed)
Transitional Care call Transitional Questions Answered by Ms. Doris: Sister  Patient name: Sharon Gilbert  DOB: March 17, 1941 1. Are you/is patient experiencing any problems since coming home? No  2. Are there any questions regarding any aspect of care? No Are there any questions regarding medications administration/dosing? No a. Are meds being taken as prescribed? Yes. Ms. Tyler Aas stated Ms. Bramlett son gage her a organic supplement, she is not sure what it was. I asked Ms. Doris to speak with Ms Theriault son not to give her anything that is not on the medication list and also to speak with the pharmacist regarding the supplement in case of any adverse reactions, she verbalizes understanding. Re-iterated Ms. Firmin should only take the medications that are on her medication list, she verbalizes understanding.  b. "Patient should review Medication List was Reviewed. meds with caller to confirm"  3. Have there been any falls? No 4. Has Home Health been to the house and/or have they contacted you? Yes, [facility] a. If not, have you tried to contact them? NA: See above. b. Can we help you contact them? NA 5. Are bowels and bladder emptying properly? Yes a. Are there any unexpected incontinence issues? No b. If applicable, is patient following bowel/bladder programs? NA 6. Any fevers, problems with breathing, unexpected pain? No 7. Are there any skin problems or new areas of breakdown? Per Discharge Summary: She already had sebaceous cyst on her left neck and warm compresses were being used in the hospital.  8. Has the patient/family member arranged specialty MD follow up (ie cardiology/neurology/renal/surgical/etc.)?  Ms. Tyler Aas was instructed to call Dr Selena Batten and Dr. Corliss Skains office to schedule HFU appointmnets, she verbalizes understanding. She has a  Scheduled appointment with [name] a. Can we help arrange? No 9. Does the patient need any other services or support that we can help arrange?  No 10. Are caregivers following through as expected in assisting the patient? Yes 11. Has the patient quit smoking, drinking alcohol, or using drugs as recommended? (                        )  Appointment date/time 08/06/2020  arrival time 11:00 for 11:20 appointment with Dr Allena Katz. At [address] [facility] [address]

## 2020-07-31 ENCOUNTER — Telehealth (HOSPITAL_COMMUNITY): Payer: Self-pay | Admitting: Radiology

## 2020-07-31 NOTE — Telephone Encounter (Signed)
Returned call to Ms. Collins. Left VM for her to call back to schedule consult visit for pt with Deveshwar. JM

## 2020-08-01 DIAGNOSIS — Z7982 Long term (current) use of aspirin: Secondary | ICD-10-CM | POA: Diagnosis not present

## 2020-08-01 DIAGNOSIS — I69390 Apraxia following cerebral infarction: Secondary | ICD-10-CM | POA: Diagnosis not present

## 2020-08-01 DIAGNOSIS — I6521 Occlusion and stenosis of right carotid artery: Secondary | ICD-10-CM | POA: Diagnosis not present

## 2020-08-01 DIAGNOSIS — R1310 Dysphagia, unspecified: Secondary | ICD-10-CM | POA: Diagnosis not present

## 2020-08-01 DIAGNOSIS — I10 Essential (primary) hypertension: Secondary | ICD-10-CM | POA: Diagnosis not present

## 2020-08-01 DIAGNOSIS — I69392 Facial weakness following cerebral infarction: Secondary | ICD-10-CM | POA: Diagnosis not present

## 2020-08-01 DIAGNOSIS — B351 Tinea unguium: Secondary | ICD-10-CM | POA: Diagnosis not present

## 2020-08-01 DIAGNOSIS — Z09 Encounter for follow-up examination after completed treatment for conditions other than malignant neoplasm: Secondary | ICD-10-CM | POA: Diagnosis not present

## 2020-08-01 DIAGNOSIS — E119 Type 2 diabetes mellitus without complications: Secondary | ICD-10-CM | POA: Diagnosis not present

## 2020-08-01 DIAGNOSIS — Z8673 Personal history of transient ischemic attack (TIA), and cerebral infarction without residual deficits: Secondary | ICD-10-CM | POA: Diagnosis not present

## 2020-08-01 DIAGNOSIS — I69351 Hemiplegia and hemiparesis following cerebral infarction affecting right dominant side: Secondary | ICD-10-CM | POA: Diagnosis not present

## 2020-08-01 DIAGNOSIS — E785 Hyperlipidemia, unspecified: Secondary | ICD-10-CM | POA: Diagnosis not present

## 2020-08-01 DIAGNOSIS — I69320 Aphasia following cerebral infarction: Secondary | ICD-10-CM | POA: Diagnosis not present

## 2020-08-01 DIAGNOSIS — H409 Unspecified glaucoma: Secondary | ICD-10-CM | POA: Diagnosis not present

## 2020-08-02 DIAGNOSIS — I69392 Facial weakness following cerebral infarction: Secondary | ICD-10-CM | POA: Diagnosis not present

## 2020-08-02 DIAGNOSIS — E119 Type 2 diabetes mellitus without complications: Secondary | ICD-10-CM | POA: Diagnosis not present

## 2020-08-02 DIAGNOSIS — Z7982 Long term (current) use of aspirin: Secondary | ICD-10-CM | POA: Diagnosis not present

## 2020-08-02 DIAGNOSIS — I10 Essential (primary) hypertension: Secondary | ICD-10-CM | POA: Diagnosis not present

## 2020-08-02 DIAGNOSIS — H409 Unspecified glaucoma: Secondary | ICD-10-CM | POA: Diagnosis not present

## 2020-08-02 DIAGNOSIS — I69320 Aphasia following cerebral infarction: Secondary | ICD-10-CM | POA: Diagnosis not present

## 2020-08-02 DIAGNOSIS — E785 Hyperlipidemia, unspecified: Secondary | ICD-10-CM | POA: Diagnosis not present

## 2020-08-02 DIAGNOSIS — I6521 Occlusion and stenosis of right carotid artery: Secondary | ICD-10-CM | POA: Diagnosis not present

## 2020-08-02 DIAGNOSIS — R1310 Dysphagia, unspecified: Secondary | ICD-10-CM | POA: Diagnosis not present

## 2020-08-02 DIAGNOSIS — I69351 Hemiplegia and hemiparesis following cerebral infarction affecting right dominant side: Secondary | ICD-10-CM | POA: Diagnosis not present

## 2020-08-06 ENCOUNTER — Other Ambulatory Visit: Payer: Self-pay

## 2020-08-06 ENCOUNTER — Encounter: Payer: Self-pay | Admitting: Physical Medicine & Rehabilitation

## 2020-08-06 ENCOUNTER — Encounter: Payer: Medicare HMO | Attending: Physical Medicine & Rehabilitation | Admitting: Physical Medicine & Rehabilitation

## 2020-08-06 VITALS — BP 148/89 | HR 84 | Temp 98.9°F | Ht 65.0 in | Wt 163.0 lb

## 2020-08-06 DIAGNOSIS — I69392 Facial weakness following cerebral infarction: Secondary | ICD-10-CM | POA: Diagnosis not present

## 2020-08-06 DIAGNOSIS — I69391 Dysphagia following cerebral infarction: Secondary | ICD-10-CM

## 2020-08-06 DIAGNOSIS — Z7982 Long term (current) use of aspirin: Secondary | ICD-10-CM | POA: Diagnosis not present

## 2020-08-06 DIAGNOSIS — H409 Unspecified glaucoma: Secondary | ICD-10-CM | POA: Diagnosis not present

## 2020-08-06 DIAGNOSIS — I69351 Hemiplegia and hemiparesis following cerebral infarction affecting right dominant side: Secondary | ICD-10-CM | POA: Diagnosis not present

## 2020-08-06 DIAGNOSIS — R1310 Dysphagia, unspecified: Secondary | ICD-10-CM | POA: Diagnosis not present

## 2020-08-06 DIAGNOSIS — I6602 Occlusion and stenosis of left middle cerebral artery: Secondary | ICD-10-CM | POA: Insufficient documentation

## 2020-08-06 DIAGNOSIS — I63232 Cerebral infarction due to unspecified occlusion or stenosis of left carotid arteries: Secondary | ICD-10-CM | POA: Diagnosis not present

## 2020-08-06 DIAGNOSIS — E119 Type 2 diabetes mellitus without complications: Secondary | ICD-10-CM | POA: Diagnosis not present

## 2020-08-06 DIAGNOSIS — I6521 Occlusion and stenosis of right carotid artery: Secondary | ICD-10-CM | POA: Insufficient documentation

## 2020-08-06 DIAGNOSIS — G8191 Hemiplegia, unspecified affecting right dominant side: Secondary | ICD-10-CM | POA: Diagnosis not present

## 2020-08-06 DIAGNOSIS — E785 Hyperlipidemia, unspecified: Secondary | ICD-10-CM | POA: Diagnosis not present

## 2020-08-06 DIAGNOSIS — I10 Essential (primary) hypertension: Secondary | ICD-10-CM | POA: Diagnosis not present

## 2020-08-06 DIAGNOSIS — I69320 Aphasia following cerebral infarction: Secondary | ICD-10-CM | POA: Diagnosis not present

## 2020-08-06 NOTE — Progress Notes (Signed)
Subjective:    Patient ID: Sharon Gilbert, female    DOB: 02-Jan-1941, 79 y.o.   MRN: [id number]  HPI RH-female with history of HTN, T2DM--diet controlled?, Glaucoma; presents for follow up for acute ischemic left ICA stroke.  Admit date: 07/11/2020 Discharge date: 07/26/2020  Son supplements history. At discharge, she was instructed to follow up with [facility], but does not have an appointment. She has an appointment with Neuro. She is tolerating her dysphagia diet. Son states that family members are giving her processed foods, which are creating a lot of inflamation in her body. She saw PCP. BP is relatively controlled. She continues to suggest numbness.  Pain is controlled. Communication is improving. Son would like more information on diet. She notes constipation. Denies falls.  Therapies: 3/week. DME: Shower chair, bedside commode Mobility: No assistive device needed.   Pain Inventory Average Pain 0 Pain Right Now 1 My pain is dull and aching  LOCATION OF PAIN  SHOULDER, UPPER BACK  BOWEL Number of stools per week: 3 Oral laxative use No  Type of laxative  Enema or suppository use No  History of colostomy No  Incontinent No   BLADDER Normal In and out cath, frequency  Able to self cath n/a Bladder incontinence No  Frequent urination No  Leakage with coughing No  Difficulty starting stream No  Incomplete bladder emptying No    Mobility walk without assistance  Function retired I need assistance with the following:  feeding, bathing, meal prep, household duties and shopping  Neuro/Psych trouble walking  Prior Studies tc  Physicians involved in your care tc   No pertinent family history of premature CVA. Social History   Socioeconomic History  . Marital status: Married    Spouse name: Not on file  . Number of children: Not on file  . Years of education: Not on file  . Highest education level: Not on file  Occupational History  . Not on file  Tobacco  Use  . Smoking status: Never Smoker  . Smokeless tobacco: Never Used  Substance and Sexual Activity  . Alcohol use: No  . Drug use: No  . Sexual activity: Yes  Other Topics Concern  . Not on file  Social History Narrative  . Not on file   Social Determinants of Health   Financial Resource Strain:   . Difficulty of Paying Living Expenses: Not on file  Food Insecurity:   . Worried About Programme researcher, broadcasting/film/video in the Last Year: Not on file  . Ran Out of Food in the Last Year: Not on file  Transportation Needs:   . Lack of Transportation (Medical): Not on file  . Lack of Transportation (Non-Medical): Not on file  Physical Activity:   . Days of Exercise per Week: Not on file  . Minutes of Exercise per Session: Not on file  Stress:   . Feeling of Stress : Not on file  Social Connections:   . Frequency of Communication with Friends and Family: Not on file  . Frequency of Social Gatherings with Friends and Family: Not on file  . Attends Religious Services: Not on file  . Active Member of Clubs or Organizations: Not on file  . Attends Banker Meetings: Not on file  . Marital Status: Not on file   Past Surgical History:  Procedure Laterality Date  . IR CT HEAD LTD  07/04/2020  . IR INTRAVSC STENT CERV CAROTID W/O EMB-PROT MOD SED INC ANGIO  07/04/2020  .  IR PERCUTANEOUS ART THROMBECTOMY/INFUSION INTRACRANIAL INC DIAG ANGIO  07/04/2020  . RADIOLOGY WITH ANESTHESIA N/A 07/04/2020   Procedure: IR WITH ANESTHESIA;  Surgeon: Radiologist, Medication, MD;  Location: [facility] OR;  Service: Radiology;  Laterality: N/A;   Past Medical History:  Diagnosis Date  . Cataract   . Diabetes mellitus without complication (HCC)    BP (!) 148/89   Pulse 84   Temp 98.9 F (37.2 C)   Ht 5\' 5"  (1.651 m)   Wt 163 lb (73.9 kg)   SpO2 99%   BMI 27.12 kg/m   Opioid Risk Score:   Fall Risk Score:  `1  Depression screen PHQ 2/9  No flowsheet data found.  Review of Systems  Unable to  perform ROS: Other  Global aphasia     Objective:   Physical Exam  Constitutional: No distress . Vital signs reviewed. HENT: Normocephalic.  Atraumatic. Eyes: EOMI. No discharge. Cardiovascular: No JVD.   Respiratory: Normal effort.  No stridor.   GI: Non-distended.   Skin: Warm and dry.  Intact. Psych: Normal mood.  Normal behavior. Musc: No edema in extremities.  No tenderness in extremities. Neuro: Alert Global aphasia - expressive aphasia, stable Motor: RUE: Shoulder abduction, elbow flexion/extension 4/5, wrist extension 4-/5, 4/5 RIght finger flexors RLE: 4+/5 proximal to distal    Assessment & Plan:  RH-female with history of HTN, T2DM--diet controlled?, Glaucoma; presents for follow up for acute ischemic left ICA stroke.  1. Global aphasia, right hemiparesis with impaired mobility and ADLs secondary to acute ischemic left ICA stroke             Encouraged WHO nightly             Cont therapies  Follow up with Neuro, VIR  2. HTN:   High graded R-ICA stenosis.   SBP goals 130-150 range.   Within range today  Cont meds  3.  Post stroke dysphagia  Cont therapies             D3 thins  4.  Slow transit constipation             Encouraged meds             Encourage prune juice  Meds reviewed Refills reviewed All questions answered

## 2020-08-07 DIAGNOSIS — E785 Hyperlipidemia, unspecified: Secondary | ICD-10-CM | POA: Diagnosis not present

## 2020-08-07 DIAGNOSIS — I69320 Aphasia following cerebral infarction: Secondary | ICD-10-CM | POA: Diagnosis not present

## 2020-08-07 DIAGNOSIS — Z7982 Long term (current) use of aspirin: Secondary | ICD-10-CM | POA: Diagnosis not present

## 2020-08-07 DIAGNOSIS — I10 Essential (primary) hypertension: Secondary | ICD-10-CM | POA: Diagnosis not present

## 2020-08-07 DIAGNOSIS — E119 Type 2 diabetes mellitus without complications: Secondary | ICD-10-CM | POA: Diagnosis not present

## 2020-08-07 DIAGNOSIS — I69351 Hemiplegia and hemiparesis following cerebral infarction affecting right dominant side: Secondary | ICD-10-CM | POA: Diagnosis not present

## 2020-08-07 DIAGNOSIS — I69392 Facial weakness following cerebral infarction: Secondary | ICD-10-CM | POA: Diagnosis not present

## 2020-08-07 DIAGNOSIS — I6521 Occlusion and stenosis of right carotid artery: Secondary | ICD-10-CM | POA: Diagnosis not present

## 2020-08-07 DIAGNOSIS — R1310 Dysphagia, unspecified: Secondary | ICD-10-CM | POA: Diagnosis not present

## 2020-08-07 DIAGNOSIS — H409 Unspecified glaucoma: Secondary | ICD-10-CM | POA: Diagnosis not present

## 2020-08-08 DIAGNOSIS — E119 Type 2 diabetes mellitus without complications: Secondary | ICD-10-CM | POA: Diagnosis not present

## 2020-08-08 DIAGNOSIS — E785 Hyperlipidemia, unspecified: Secondary | ICD-10-CM | POA: Diagnosis not present

## 2020-08-08 DIAGNOSIS — H409 Unspecified glaucoma: Secondary | ICD-10-CM | POA: Diagnosis not present

## 2020-08-08 DIAGNOSIS — I69392 Facial weakness following cerebral infarction: Secondary | ICD-10-CM | POA: Diagnosis not present

## 2020-08-08 DIAGNOSIS — R1310 Dysphagia, unspecified: Secondary | ICD-10-CM | POA: Diagnosis not present

## 2020-08-08 DIAGNOSIS — I69320 Aphasia following cerebral infarction: Secondary | ICD-10-CM | POA: Diagnosis not present

## 2020-08-08 DIAGNOSIS — Z7982 Long term (current) use of aspirin: Secondary | ICD-10-CM | POA: Diagnosis not present

## 2020-08-08 DIAGNOSIS — I6521 Occlusion and stenosis of right carotid artery: Secondary | ICD-10-CM | POA: Diagnosis not present

## 2020-08-08 DIAGNOSIS — I69351 Hemiplegia and hemiparesis following cerebral infarction affecting right dominant side: Secondary | ICD-10-CM | POA: Diagnosis not present

## 2020-08-08 DIAGNOSIS — I10 Essential (primary) hypertension: Secondary | ICD-10-CM | POA: Diagnosis not present

## 2020-08-09 DIAGNOSIS — I6521 Occlusion and stenosis of right carotid artery: Secondary | ICD-10-CM | POA: Diagnosis not present

## 2020-08-09 DIAGNOSIS — I69320 Aphasia following cerebral infarction: Secondary | ICD-10-CM | POA: Diagnosis not present

## 2020-08-09 DIAGNOSIS — I69392 Facial weakness following cerebral infarction: Secondary | ICD-10-CM | POA: Diagnosis not present

## 2020-08-09 DIAGNOSIS — R1310 Dysphagia, unspecified: Secondary | ICD-10-CM | POA: Diagnosis not present

## 2020-08-09 DIAGNOSIS — I10 Essential (primary) hypertension: Secondary | ICD-10-CM | POA: Diagnosis not present

## 2020-08-09 DIAGNOSIS — I69351 Hemiplegia and hemiparesis following cerebral infarction affecting right dominant side: Secondary | ICD-10-CM | POA: Diagnosis not present

## 2020-08-09 DIAGNOSIS — E119 Type 2 diabetes mellitus without complications: Secondary | ICD-10-CM | POA: Diagnosis not present

## 2020-08-09 DIAGNOSIS — E785 Hyperlipidemia, unspecified: Secondary | ICD-10-CM | POA: Diagnosis not present

## 2020-08-09 DIAGNOSIS — H409 Unspecified glaucoma: Secondary | ICD-10-CM | POA: Diagnosis not present

## 2020-08-09 DIAGNOSIS — Z7982 Long term (current) use of aspirin: Secondary | ICD-10-CM | POA: Diagnosis not present

## 2020-08-10 DIAGNOSIS — I69351 Hemiplegia and hemiparesis following cerebral infarction affecting right dominant side: Secondary | ICD-10-CM | POA: Diagnosis not present

## 2020-08-10 DIAGNOSIS — R1310 Dysphagia, unspecified: Secondary | ICD-10-CM | POA: Diagnosis not present

## 2020-08-10 DIAGNOSIS — H409 Unspecified glaucoma: Secondary | ICD-10-CM | POA: Diagnosis not present

## 2020-08-10 DIAGNOSIS — I6521 Occlusion and stenosis of right carotid artery: Secondary | ICD-10-CM | POA: Diagnosis not present

## 2020-08-10 DIAGNOSIS — E785 Hyperlipidemia, unspecified: Secondary | ICD-10-CM | POA: Diagnosis not present

## 2020-08-10 DIAGNOSIS — Z7982 Long term (current) use of aspirin: Secondary | ICD-10-CM | POA: Diagnosis not present

## 2020-08-10 DIAGNOSIS — I69392 Facial weakness following cerebral infarction: Secondary | ICD-10-CM | POA: Diagnosis not present

## 2020-08-10 DIAGNOSIS — E119 Type 2 diabetes mellitus without complications: Secondary | ICD-10-CM | POA: Diagnosis not present

## 2020-08-10 DIAGNOSIS — I10 Essential (primary) hypertension: Secondary | ICD-10-CM | POA: Diagnosis not present

## 2020-08-10 DIAGNOSIS — I69320 Aphasia following cerebral infarction: Secondary | ICD-10-CM | POA: Diagnosis not present

## 2020-08-13 DIAGNOSIS — I69320 Aphasia following cerebral infarction: Secondary | ICD-10-CM | POA: Diagnosis not present

## 2020-08-13 DIAGNOSIS — Z7982 Long term (current) use of aspirin: Secondary | ICD-10-CM | POA: Diagnosis not present

## 2020-08-13 DIAGNOSIS — E785 Hyperlipidemia, unspecified: Secondary | ICD-10-CM | POA: Diagnosis not present

## 2020-08-13 DIAGNOSIS — E119 Type 2 diabetes mellitus without complications: Secondary | ICD-10-CM | POA: Diagnosis not present

## 2020-08-13 DIAGNOSIS — I69351 Hemiplegia and hemiparesis following cerebral infarction affecting right dominant side: Secondary | ICD-10-CM | POA: Diagnosis not present

## 2020-08-13 DIAGNOSIS — R1310 Dysphagia, unspecified: Secondary | ICD-10-CM | POA: Diagnosis not present

## 2020-08-13 DIAGNOSIS — I69392 Facial weakness following cerebral infarction: Secondary | ICD-10-CM | POA: Diagnosis not present

## 2020-08-13 DIAGNOSIS — I10 Essential (primary) hypertension: Secondary | ICD-10-CM | POA: Diagnosis not present

## 2020-08-13 DIAGNOSIS — I6521 Occlusion and stenosis of right carotid artery: Secondary | ICD-10-CM | POA: Diagnosis not present

## 2020-08-13 DIAGNOSIS — H409 Unspecified glaucoma: Secondary | ICD-10-CM | POA: Diagnosis not present

## 2020-08-14 ENCOUNTER — Other Ambulatory Visit (HOSPITAL_COMMUNITY): Payer: Self-pay | Admitting: Interventional Radiology

## 2020-08-14 DIAGNOSIS — I10 Essential (primary) hypertension: Secondary | ICD-10-CM | POA: Diagnosis not present

## 2020-08-14 DIAGNOSIS — I6521 Occlusion and stenosis of right carotid artery: Secondary | ICD-10-CM | POA: Diagnosis not present

## 2020-08-14 DIAGNOSIS — I69392 Facial weakness following cerebral infarction: Secondary | ICD-10-CM | POA: Diagnosis not present

## 2020-08-14 DIAGNOSIS — R1310 Dysphagia, unspecified: Secondary | ICD-10-CM | POA: Diagnosis not present

## 2020-08-14 DIAGNOSIS — E119 Type 2 diabetes mellitus without complications: Secondary | ICD-10-CM | POA: Diagnosis not present

## 2020-08-14 DIAGNOSIS — I69320 Aphasia following cerebral infarction: Secondary | ICD-10-CM | POA: Diagnosis not present

## 2020-08-14 DIAGNOSIS — I639 Cerebral infarction, unspecified: Secondary | ICD-10-CM

## 2020-08-14 DIAGNOSIS — I69351 Hemiplegia and hemiparesis following cerebral infarction affecting right dominant side: Secondary | ICD-10-CM | POA: Diagnosis not present

## 2020-08-14 DIAGNOSIS — Z7982 Long term (current) use of aspirin: Secondary | ICD-10-CM | POA: Diagnosis not present

## 2020-08-14 DIAGNOSIS — E785 Hyperlipidemia, unspecified: Secondary | ICD-10-CM | POA: Diagnosis not present

## 2020-08-14 DIAGNOSIS — H409 Unspecified glaucoma: Secondary | ICD-10-CM | POA: Diagnosis not present

## 2020-08-15 DIAGNOSIS — I6521 Occlusion and stenosis of right carotid artery: Secondary | ICD-10-CM | POA: Diagnosis not present

## 2020-08-15 DIAGNOSIS — I69351 Hemiplegia and hemiparesis following cerebral infarction affecting right dominant side: Secondary | ICD-10-CM | POA: Diagnosis not present

## 2020-08-15 DIAGNOSIS — I69320 Aphasia following cerebral infarction: Secondary | ICD-10-CM | POA: Diagnosis not present

## 2020-08-15 DIAGNOSIS — E119 Type 2 diabetes mellitus without complications: Secondary | ICD-10-CM | POA: Diagnosis not present

## 2020-08-15 DIAGNOSIS — I10 Essential (primary) hypertension: Secondary | ICD-10-CM | POA: Diagnosis not present

## 2020-08-15 DIAGNOSIS — E785 Hyperlipidemia, unspecified: Secondary | ICD-10-CM | POA: Diagnosis not present

## 2020-08-15 DIAGNOSIS — H409 Unspecified glaucoma: Secondary | ICD-10-CM | POA: Diagnosis not present

## 2020-08-15 DIAGNOSIS — I69392 Facial weakness following cerebral infarction: Secondary | ICD-10-CM | POA: Diagnosis not present

## 2020-08-15 DIAGNOSIS — Z7982 Long term (current) use of aspirin: Secondary | ICD-10-CM | POA: Diagnosis not present

## 2020-08-15 DIAGNOSIS — R1310 Dysphagia, unspecified: Secondary | ICD-10-CM | POA: Diagnosis not present

## 2020-08-16 DIAGNOSIS — E785 Hyperlipidemia, unspecified: Secondary | ICD-10-CM | POA: Diagnosis not present

## 2020-08-16 DIAGNOSIS — I10 Essential (primary) hypertension: Secondary | ICD-10-CM | POA: Diagnosis not present

## 2020-08-16 DIAGNOSIS — I6521 Occlusion and stenosis of right carotid artery: Secondary | ICD-10-CM | POA: Diagnosis not present

## 2020-08-16 DIAGNOSIS — I69351 Hemiplegia and hemiparesis following cerebral infarction affecting right dominant side: Secondary | ICD-10-CM | POA: Diagnosis not present

## 2020-08-16 DIAGNOSIS — R1310 Dysphagia, unspecified: Secondary | ICD-10-CM | POA: Diagnosis not present

## 2020-08-16 DIAGNOSIS — I69392 Facial weakness following cerebral infarction: Secondary | ICD-10-CM | POA: Diagnosis not present

## 2020-08-16 DIAGNOSIS — E119 Type 2 diabetes mellitus without complications: Secondary | ICD-10-CM | POA: Diagnosis not present

## 2020-08-16 DIAGNOSIS — I69320 Aphasia following cerebral infarction: Secondary | ICD-10-CM | POA: Diagnosis not present

## 2020-08-16 DIAGNOSIS — Z7982 Long term (current) use of aspirin: Secondary | ICD-10-CM | POA: Diagnosis not present

## 2020-08-16 DIAGNOSIS — H409 Unspecified glaucoma: Secondary | ICD-10-CM | POA: Diagnosis not present

## 2020-08-18 ENCOUNTER — Encounter: Payer: Self-pay | Admitting: Adult Health

## 2020-08-20 DIAGNOSIS — I69351 Hemiplegia and hemiparesis following cerebral infarction affecting right dominant side: Secondary | ICD-10-CM | POA: Diagnosis not present

## 2020-08-20 DIAGNOSIS — E785 Hyperlipidemia, unspecified: Secondary | ICD-10-CM | POA: Diagnosis not present

## 2020-08-20 DIAGNOSIS — H409 Unspecified glaucoma: Secondary | ICD-10-CM | POA: Diagnosis not present

## 2020-08-20 DIAGNOSIS — I6521 Occlusion and stenosis of right carotid artery: Secondary | ICD-10-CM | POA: Diagnosis not present

## 2020-08-20 DIAGNOSIS — R1310 Dysphagia, unspecified: Secondary | ICD-10-CM | POA: Diagnosis not present

## 2020-08-20 DIAGNOSIS — Z7982 Long term (current) use of aspirin: Secondary | ICD-10-CM | POA: Diagnosis not present

## 2020-08-20 DIAGNOSIS — E119 Type 2 diabetes mellitus without complications: Secondary | ICD-10-CM | POA: Diagnosis not present

## 2020-08-20 DIAGNOSIS — I10 Essential (primary) hypertension: Secondary | ICD-10-CM | POA: Diagnosis not present

## 2020-08-20 DIAGNOSIS — I69320 Aphasia following cerebral infarction: Secondary | ICD-10-CM | POA: Diagnosis not present

## 2020-08-20 DIAGNOSIS — I69392 Facial weakness following cerebral infarction: Secondary | ICD-10-CM | POA: Diagnosis not present

## 2020-08-21 DIAGNOSIS — E119 Type 2 diabetes mellitus without complications: Secondary | ICD-10-CM | POA: Diagnosis not present

## 2020-08-21 DIAGNOSIS — I10 Essential (primary) hypertension: Secondary | ICD-10-CM | POA: Diagnosis not present

## 2020-08-21 DIAGNOSIS — Z7982 Long term (current) use of aspirin: Secondary | ICD-10-CM | POA: Diagnosis not present

## 2020-08-21 DIAGNOSIS — I69392 Facial weakness following cerebral infarction: Secondary | ICD-10-CM | POA: Diagnosis not present

## 2020-08-21 DIAGNOSIS — I6521 Occlusion and stenosis of right carotid artery: Secondary | ICD-10-CM | POA: Diagnosis not present

## 2020-08-21 DIAGNOSIS — I69351 Hemiplegia and hemiparesis following cerebral infarction affecting right dominant side: Secondary | ICD-10-CM | POA: Diagnosis not present

## 2020-08-21 DIAGNOSIS — H409 Unspecified glaucoma: Secondary | ICD-10-CM | POA: Diagnosis not present

## 2020-08-21 DIAGNOSIS — R1310 Dysphagia, unspecified: Secondary | ICD-10-CM | POA: Diagnosis not present

## 2020-08-21 DIAGNOSIS — I69320 Aphasia following cerebral infarction: Secondary | ICD-10-CM | POA: Diagnosis not present

## 2020-08-21 DIAGNOSIS — E785 Hyperlipidemia, unspecified: Secondary | ICD-10-CM | POA: Diagnosis not present

## 2020-08-22 ENCOUNTER — Other Ambulatory Visit: Payer: Self-pay

## 2020-08-22 ENCOUNTER — Ambulatory Visit: Payer: Medicare HMO | Admitting: Adult Health

## 2020-08-22 ENCOUNTER — Encounter: Payer: Self-pay | Admitting: Adult Health

## 2020-08-22 VITALS — BP 131/69 | HR 71 | Ht 65.0 in | Wt 160.0 lb

## 2020-08-22 DIAGNOSIS — I69320 Aphasia following cerebral infarction: Secondary | ICD-10-CM | POA: Diagnosis not present

## 2020-08-22 DIAGNOSIS — E785 Hyperlipidemia, unspecified: Secondary | ICD-10-CM | POA: Diagnosis not present

## 2020-08-22 DIAGNOSIS — I69351 Hemiplegia and hemiparesis following cerebral infarction affecting right dominant side: Secondary | ICD-10-CM | POA: Diagnosis not present

## 2020-08-22 DIAGNOSIS — I6523 Occlusion and stenosis of bilateral carotid arteries: Secondary | ICD-10-CM | POA: Diagnosis not present

## 2020-08-22 DIAGNOSIS — H409 Unspecified glaucoma: Secondary | ICD-10-CM | POA: Diagnosis not present

## 2020-08-22 DIAGNOSIS — I10 Essential (primary) hypertension: Secondary | ICD-10-CM

## 2020-08-22 DIAGNOSIS — R1310 Dysphagia, unspecified: Secondary | ICD-10-CM | POA: Diagnosis not present

## 2020-08-22 DIAGNOSIS — E119 Type 2 diabetes mellitus without complications: Secondary | ICD-10-CM | POA: Diagnosis not present

## 2020-08-22 DIAGNOSIS — I69392 Facial weakness following cerebral infarction: Secondary | ICD-10-CM | POA: Diagnosis not present

## 2020-08-22 DIAGNOSIS — I6521 Occlusion and stenosis of right carotid artery: Secondary | ICD-10-CM | POA: Diagnosis not present

## 2020-08-22 DIAGNOSIS — Z7982 Long term (current) use of aspirin: Secondary | ICD-10-CM | POA: Diagnosis not present

## 2020-08-22 DIAGNOSIS — I63512 Cerebral infarction due to unspecified occlusion or stenosis of left middle cerebral artery: Secondary | ICD-10-CM

## 2020-08-22 NOTE — Patient Instructions (Addendum)
Continue working with therapies at home - may need to transition to outpatient therapies once completed - you can either call this office or speak with Dr. Allena Katz at follow up visit  Continue aspirin 81 mg daily and Brilinta (ticagrelor) 90 mg bid  and atorvastatin for secondary stroke prevention  Follow-up with Dr. Corliss Skains on 12/3 - further discuss price of Brilinta and further treatment options regarding right carotid artery  Continue to follow up with PCP regarding cholesterol and blood pressure management  Maintain strict control of hypertension with blood pressure goal below 130/90 and cholesterol with LDL cholesterol (bad cholesterol) goal below 70 mg/dL.      Followup in the future with me in 3 months with [name] or call earlier if needed     Thank you for coming to see [place] at [facility]. I hope we have been able to provide you high quality care today.  You may receive a patient satisfaction survey over the next few weeks. We would appreciate your feedback and comments so that we may continue to improve ourselves and the health of our patients.     Stroke Prevention Some medical conditions and behaviors are associated with a higher chance of having a stroke. You can help prevent a stroke by making nutrition, lifestyle, and other changes, including managing any medical conditions you may have. What nutrition changes can be made?   Eat healthy foods. You can do this by: ? Choosing foods high in fiber, such as fresh fruits and vegetables and whole grains. ? Eating at least 5 or more servings of fruits and vegetables a day. Try to fill half of your plate at each meal with fruits and vegetables. ? Choosing lean protein foods, such as lean cuts of meat, poultry without skin, fish, tofu, beans, and nuts. ? Eating low-fat dairy products. ? Avoiding foods that are high in salt (sodium). This can help lower blood pressure. ? Avoiding foods that have saturated  fat, trans fat, and cholesterol. This can help prevent high cholesterol. ? Avoiding processed and premade foods.  Follow your health care provider's specific guidelines for losing weight, controlling high blood pressure (hypertension), lowering high cholesterol, and managing diabetes. These may include: ? Reducing your daily calorie intake. ? Limiting your daily sodium intake to 1,500 milligrams (mg). ? Using only healthy fats for cooking, such as olive oil, canola oil, or sunflower oil. ? Counting your daily carbohydrate intake. What lifestyle changes can be made?  Maintain a healthy weight. Talk to your health care provider about your ideal weight.  Get at least 30 minutes of moderate physical activity at least 5 days a week. Moderate activity includes brisk walking, biking, and swimming.  Do not use any products that contain nicotine or tobacco, such as cigarettes and e-cigarettes. If you need help quitting, ask your health care provider. It may also be helpful to avoid exposure to secondhand smoke.  Limit alcohol intake to no more than 1 drink a day for nonpregnant women and 2 drinks a day for men. One drink equals 12 oz of beer, 5 oz of wine, or 1 oz of hard liquor.  Stop any illegal drug use.  Avoid taking birth control pills. Talk to your health care provider about the risks of taking birth control pills if: ? You are over 37 years old. ? You smoke. ? You get migraines. ? You have ever had a blood clot. What other changes can be made?  Manage your cholesterol levels. ?  Eating a healthy diet is important for preventing high cholesterol. If cholesterol cannot be managed through diet alone, you may also need to take medicines. ? Take any prescribed medicines to control your cholesterol as told by your health care provider.  Manage your diabetes. ? Eating a healthy diet and exercising regularly are important parts of managing your blood sugar. If your blood sugar cannot be managed  through diet and exercise, you may need to take medicines. ? Take any prescribed medicines to control your diabetes as told by your health care provider.  Control your hypertension. ? To reduce your risk of stroke, try to keep your blood pressure below 130/80. ? Eating a healthy diet and exercising regularly are an important part of controlling your blood pressure. If your blood pressure cannot be managed through diet and exercise, you may need to take medicines. ? Take any prescribed medicines to control hypertension as told by your health care provider. ? Ask your health care provider if you should monitor your blood pressure at home. ? Have your blood pressure checked every year, even if your blood pressure is normal. Blood pressure increases with age and some medical conditions.  Get evaluated for sleep disorders (sleep apnea). Talk to your health care provider about getting a sleep evaluation if you snore a lot or have excessive sleepiness.  Take over-the-counter and prescription medicines only as told by your health care provider. Aspirin or blood thinners (antiplatelets or anticoagulants) may be recommended to reduce your risk of forming blood clots that can lead to stroke.  Make sure that any other medical conditions you have, such as atrial fibrillation or atherosclerosis, are managed. What are the warning signs of a stroke? The warning signs of a stroke can be easily remembered as BEFAST.  B is for balance. Signs include: ? Dizziness. ? Loss of balance or coordination. ? Sudden trouble walking.  E is for eyes. Signs include: ? A sudden change in vision. ? Trouble seeing.  F is for face. Signs include: ? Sudden weakness or numbness of the face. ? The face or eyelid drooping to one side.  A is for arms. Signs include: ? Sudden weakness or numbness of the arm, usually on one side of the body.  S is for speech. Signs include: ? Trouble speaking (aphasia). ? Trouble  understanding.  T is for time. ? These symptoms may represent a serious problem that is an emergency. Do not wait to see if the symptoms will go away. Get medical help right away. Call your local emergency services (911 in the [place] Do not drive yourself to the hospital.  Other signs of stroke may include: ? A sudden, severe headache with no known cause. ? Nausea or vomiting. ? Seizure. Where to find more information For more information, visit:  American Stroke Association: www.strokeassociation.org  National Stroke Association: www.stroke.org Summary  You can prevent a stroke by eating healthy, exercising, not smoking, limiting alcohol intake, and managing any medical conditions you may have.  Do not use any products that contain nicotine or tobacco, such as cigarettes and e-cigarettes. If you need help quitting, ask your health care provider. It may also be helpful to avoid exposure to secondhand smoke.  Remember BEFAST for warning signs of stroke. Get help right away if you or a loved one has any of these signs. This information is not intended to replace advice given to you by your health care provider. Make sure you discuss any questions you have with  your health care provider. Document Revised: 09/04/2017 Document Reviewed: 10/28/2016 Elsevier Patient Education  2020 [employer].

## 2020-08-22 NOTE — Progress Notes (Signed)
[facility] [address] [place] [id number] [phone]       HOSPITAL FOLLOW UP NOTE  [name] Date of Birth:  01/01/1941 Medical Record Number:  [id number]   Reason for Referral:  hospital stroke follow up    SUBJECTIVE:   CHIEF COMPLAINT:  Chief Complaint  Patient presents with  . Hospitalization Follow-up    RM 9, with son, pt states she is doing well, would like to discuss artery and possibly switching brilinta due to cost, c/o difficulty chewing and eating     HPI:   Ms.Sharon D Crewsis a 79 y.o.femalewith history of cataracts and diabeteswho presented on 07/03/2020 with slurred speech, and mild face droop. Personally reviewed hospitalization pertinent progress notes, lab work and imaging with summary provided.  Stroke work-up revealed L MCA infarct due to L ICA and M2 occlusion from large vessel disease with subsequent neuro worsening s/p L ICA stent and left MCA M1 thrombectomy with postprocedure hemorrhagic conversion at L MCA infarct.  Previously on aspirin and added Brilinta post stent for secondary stroke prevention.  Advised follow-up with Dr. Corliss Skainseveshwar outpatient for surveillance monitoring for L ICA s/p stent and R ICA high-grade near occlusive stenosis with string sign. Hx of HTN on lisinopril-hydrochlorothiazide PTA and recommended long-term BP goal 130-150 given ICA stenosis.  LDL 202 and initiated atorvastatin 80 mg daily.  Pre-DM with A1c 6.1.  Other stroke risk factors include advanced age but no prior stroke history.  Residual deficits of expressive aphasia, dysphagia, right hemiparesis and decreased sensory.  Evaluated by therapies and recommended discharge to CIR for ongoing therapy needs.  She was discharged to CIR on 07/11/2020.  Stroke: L MCA infarct due to L ICA and M2 occlusion from large vessel disease with subsequent neuro worsening s/p ICA stent and left MCA thrombectomy. Postprocedure hemorrhagic conversion  at L MCA infarct   CT head9/28No acute abnormality. Mild cerebral Atrophy. ASPECTS 10.   CTA head and neck L ICA occlusion beyond origin and remains occluded through neck. Proximal L M2 occlusion.High-grade near occlusive stenosis proximal R ICA w/ string sign. Intracranial R ICA w/ moderate stenosis cavernous segment. L VA origin w/ severe atherosclerosis narrowing.   CT perfusion 31mL penumbra L frontal operculum w/o Core  MRI 9/28Multiple small L frontal and parietal lobe infarcts.   IR - 07/04/20- Lt MCA M1 occlusion with TICI 3 revascularization. S/P revascularization of Lt ICA prox with stent assisted angioplasty.  CT Head 9/30 - increased SAH which now extends into the basal cisterns. Cytotoxic edema in the left insula and lateral frontal lobe  MRI 07/07/20 -Evolving moderate-sized acute left MCA infarct with similarappearance of parenchymal hemorrhage in the left insular region andsubarachnoid hemorrhage compared to the prior CT.  MRA 07/07/20 -Intracranial atherosclerosis with mild right and mild-to-moderate left ICA stenoses. The left ICA remains patent following revascularization.  2D Echo- EF 70 - 75%. No cardiac source of emboli identified.   [id number]   HgbA1c6.1   P2Y12 19  VTE prophylaxis - Lovenox 40 mg sq daily   aspirin 81 mg dailyprior to admission, now on aspirin 81 mg daily and Brilinta (ticagrelor) 90 mg bid. Continue on discharge.  Therapy recommendations: CIR  Disposition: CIR  Today, 08/22/2020, [name] is being seen for hospital follow-up accompanied by her son.  After 15 days, she was discharged home from CIR on 07/26/2020 with recommended [facility] PT/OT/SLP for residual right hemiparesis, right inattention, dysphagia, aphasia and apraxia.  Since discharge, reports residual  expressive aphasia, dysphagia, RUE weakness and imbalance. Eating softened foods but regular liquids without difficulty. Currently working with [facility] SLP/PT/OT with ongoing  improvement.  Denies new or worsening stroke/TIA symptoms.  She is currently living with her elderly sister and is able to maintain majority of ADLs independently.  She does require assistance for IADLs such as cooking, cleaning, bill paying and transportation as she is not driving.  She was previously living independently.  Has continued on aspirin and Brilinta without bleeding or bruising. Son does question switching Brilinta to a different medication due to cost which she reports is currently $250/month.  She has scheduled visit with [facility] Dr. Corliss Skains on 09/07/2020 for initial hospital follow-up.  He also questions why there is no intervention performed while she was in the hospital for her right carotid stenosis.  Has continued on atorvastatin 80 mg daily without myalgias.  Blood pressure today 131/69.  Monitors at home and typically 120-130s/80s. Son reports dietary modifications and increasing fluid intake.  No further concerns at this time    ROS:   Unable to obtain due to aphasia  PMH:  Past Medical History:  Diagnosis Date  . Cataract   . Diabetes mellitus without complication (HCC)     PSH:  Past Surgical History:  Procedure Laterality Date  . IR CT HEAD LTD  07/04/2020  . IR INTRAVSC STENT CERV CAROTID W/O EMB-PROT MOD SED INC ANGIO  07/04/2020  . IR PERCUTANEOUS ART THROMBECTOMY/INFUSION INTRACRANIAL INC DIAG ANGIO  07/04/2020  . RADIOLOGY WITH ANESTHESIA N/A 07/04/2020   Procedure: IR WITH ANESTHESIA;  Surgeon: Radiologist, Medication, MD;  Location: [facility] OR;  Service: Radiology;  Laterality: N/A;    Social History:  Social History   Socioeconomic History  . Marital status: Married    Spouse name: Not on file  . Number of children: Not on file  . Years of education: Not on file  . Highest education level: Not on file  Occupational History  . Not on file  Tobacco Use  . Smoking status: Never Smoker  . Smokeless tobacco: Never Used  Substance and Sexual Activity  . Alcohol  use: No  . Drug use: No  . Sexual activity: Yes  Other Topics Concern  . Not on file  Social History Narrative  . Not on file   Social Determinants of Health   Financial Resource Strain:   . Difficulty of Paying Living Expenses: Not on file  Food Insecurity:   . Worried About Programme researcher, broadcasting/film/video in the Last Year: Not on file  . Ran Out of Food in the Last Year: Not on file  Transportation Needs:   . Lack of Transportation (Medical): Not on file  . Lack of Transportation (Non-Medical): Not on file  Physical Activity:   . Days of Exercise per Week: Not on file  . Minutes of Exercise per Session: Not on file  Stress:   . Feeling of Stress : Not on file  Social Connections:   . Frequency of Communication with Friends and Family: Not on file  . Frequency of Social Gatherings with Friends and Family: Not on file  . Attends Religious Services: Not on file  . Active Member of Clubs or Organizations: Not on file  . Attends Banker Meetings: Not on file  . Marital Status: Not on file  Intimate Partner Violence:   . Fear of Current or Ex-Partner: Not on file  . Emotionally Abused: Not on file  . Physically Abused: Not  on file  . Sexually Abused: Not on file    Family History: No family history on file.  Medications:   Current Outpatient Medications on File Prior to Visit  Medication Sig Dispense Refill  . acetaminophen (TYLENOL) 325 MG tablet Take 1-2 tablets (325-650 mg total) by mouth every 4 (four) hours as needed for mild pain.    Marland Kitchen aspirin 81 MG chewable tablet Chew 1 tablet (81 mg total) by mouth daily.    Marland Kitchen atorvastatin (LIPITOR) 80 MG tablet Take 1 tablet (80 mg total) by mouth daily at 6 PM. 60 tablet 0  . dorzolamide-timolol (COSOPT) 22.3-6.8 MG/ML ophthalmic solution Place 1 drop into both eyes 2 (two) times daily. 10 mL 12  . latanoprost (XALATAN) 0.005 % ophthalmic solution Place 1 drop into both eyes at bedtime. 2.5 mL 12  . lisinopril (ZESTRIL) 10 MG  tablet Take 1 tablet (10 mg total) by mouth daily. 30 tablet 0  . Menthol-Methyl Salicylate (MUSCLE RUB) 10-15 % CREA Apply 1 application topically 2 (two) times daily.  0  . polyethylene glycol (MIRALAX / GLYCOLAX) 17 g packet Take 17 g by mouth 2 (two) times daily. 60 each 0  . senna-docusate (SENOKOT-S) 8.6-50 MG tablet Take 1 tablet by mouth at bedtime. 30 tablet 0  . ticagrelor (BRILINTA) 90 MG TABS tablet Take 1 tablet (90 mg total) by mouth 2 (two) times daily. 60 tablet 0   No current facility-administered medications on file prior to visit.    Allergies:   Allergies  Allergen Reactions  . Lipitor [Atorvastatin] Other (See Comments)    Muscle pain  . Topiramate Other (See Comments)    Vomiting & Wheezing      OBJECTIVE:  Physical Exam  Vitals:   08/22/20 1022  BP: 131/69  Pulse: 71  Weight: 160 lb (72.6 kg)  Height: 5\' 5"  (1.651 m)   Body mass index is 26.63 kg/m. No exam data present  General: well developed, well nourished,  pleasant elderly African-American female, seated, in no evident distress Head: head normocephalic and atraumatic.   Neck: supple with no carotid or supraclavicular bruits Cardiovascular: regular rate and rhythm, no murmurs Musculoskeletal: no deformity Skin:  no rash/petichiae Vascular:  Normal pulses all extremities   Neurologic Exam Mental Status: Awake and fully alert.   Moderate to severe expressive aphasia with possible mild receptive aphasia.  Able to follow simple step commands without difficulty.  Difficulty assessing orientation due to aphasia. Mood and affect appropriate.  Cranial Nerves: Fundoscopic exam reveals sharp disc margins. Pupils equal, briskly reactive to light. Extraocular movements full without nystagmus. Visual fields full to confrontation. Hearing intact. Facial sensation intact.  Mild right lower facial weakness.  Tongue, palate moves normally and symmetrically.  Motor: Normal bulk and tone. Normal strength in all  tested extremity muscles except RUE 4/5 with mildly weak grip strength and decreased finger dexterity Sensory.:  Altered sensation right upper and lower extremity compared to left side Coordination: Rapid alternating movements normal in all extremities except decreased right hand. Finger-to-nose performed accurately LUE and heel-to-shin performed accurately bilaterally. Gait and Station: Arises from chair without difficulty. Stance is normal. Gait demonstrates normal stride length with mild imbalance greater with turns.  No assistive device.  Mild difficulty with tandem walk and heel toe walk.  Romberg negative. Reflexes: 1+ and symmetric. Toes downgoing.     NIHSS 5 1a.  Level of consciousness 0 1b. LOC questions 1 1c. LOC commands 0 2.  Best gaze 0 3.  Visual 0 4.  Facial palsy 1 5a.  Motor arm-left 0 5b.  Motor arm-right 0 6a.  Motor leg-left 0 6b.  Motor leg-right 0 7.  Limb ataxia 0 8.  Sensory 1 9.  Best language 2 10.  Dysarthria 0 11.  Extinction and inattention 0  Modified Rankin  3     ASSESSMENT: Sharon Gilbert is a 79 y.o. year old female with L MCA infarct due to left ICA and M2 occlusion for large vessel disease with subsequent neuro worsening s/p L ICA stent and left MCA M2 thrombectomy with postprocedure hemorrhagic conversion at left MCA infarct on 07/03/2020 after presenting with slurred speech, right facial droop, aphasia and RUE weakness.  Vascular risk factors include HTN, HLD, pre-DM, b/l carotid stenosis and advanced age.      PLAN:  1. L MCA stroke :  a. Residual deficit: RUE weakness, hemisensory impairment, moderate to severe aphasia and possible mild receptive aphasia, dysphagia and imbalance.  Advise continued participation with home health therapies and possibly transition to outpatient therapies once completed.  Would not recommend she return to driving or her home living completely independently at this time until further recovery which was further  discussed with both patient and son. b. Continue aspirin 81 mg daily and Brilinta (ticagrelor) 90 mg bid  and atorvastatin 80 mg daily for secondary stroke prevention.   c. Discussed secondary stroke prevention measures and importance of close PCP follow up for aggressive stroke risk factor management  2. Carotid stenosis:  a. s/p L ICA stent; R ICA high-grade stenosis b. F/u visit 12/3 with [facility] - advised to further discuss cost of Brilinta and R ICA interventions at follow up visit with [facility] 3. HTN: BP goal 130-150 given ICA stenosis.  On lisinopril per PCP 4. HLD: LDL goal <70. Recent LDL 80.  Initiated atorvastatin 80 mg daily during stroke admission. Request repeat lipid panel and prescribing by PCP 5. Pre-DMII: A1c goal<7.0. Recent A1c 6.1.  Request ongoing monitoring by PCP    Follow up in 3 months or call earlier if needed - family requests f/u with [name]   CC:  [facility] provider: Dr. Demetrios Isaacs, Fayrene Fearing, MD    I spent 45 minutes of face-to-face and non-face-to-face time with patient.  This included previsit chart review including pertinent hospitalization progress notes, lab work and imaging, lab review, study review, order entry, electronic health record documentation, patient and son education and discussion regarding recent stroke, residual deficits, importance of managing stroke risk factors and answered all questions to patient satisfaction    Ihor Austin, [facility]  [facility] [address] [place], [place] [address]  Phone [phone] Fax [phone] Note: This document was prepared with digital dictation and possible smart phrase technology. Any transcriptional errors that result from this process are unintentional.

## 2020-08-23 ENCOUNTER — Other Ambulatory Visit: Payer: Self-pay | Admitting: Physical Medicine and Rehabilitation

## 2020-08-23 ENCOUNTER — Other Ambulatory Visit: Payer: Self-pay

## 2020-08-23 DIAGNOSIS — I6521 Occlusion and stenosis of right carotid artery: Secondary | ICD-10-CM | POA: Diagnosis not present

## 2020-08-23 DIAGNOSIS — Z7982 Long term (current) use of aspirin: Secondary | ICD-10-CM | POA: Diagnosis not present

## 2020-08-23 DIAGNOSIS — E119 Type 2 diabetes mellitus without complications: Secondary | ICD-10-CM | POA: Diagnosis not present

## 2020-08-23 DIAGNOSIS — R1310 Dysphagia, unspecified: Secondary | ICD-10-CM | POA: Diagnosis not present

## 2020-08-23 DIAGNOSIS — I10 Essential (primary) hypertension: Secondary | ICD-10-CM | POA: Diagnosis not present

## 2020-08-23 DIAGNOSIS — I69392 Facial weakness following cerebral infarction: Secondary | ICD-10-CM | POA: Diagnosis not present

## 2020-08-23 DIAGNOSIS — E785 Hyperlipidemia, unspecified: Secondary | ICD-10-CM | POA: Diagnosis not present

## 2020-08-23 DIAGNOSIS — I69320 Aphasia following cerebral infarction: Secondary | ICD-10-CM | POA: Diagnosis not present

## 2020-08-23 DIAGNOSIS — H409 Unspecified glaucoma: Secondary | ICD-10-CM | POA: Diagnosis not present

## 2020-08-23 DIAGNOSIS — I69351 Hemiplegia and hemiparesis following cerebral infarction affecting right dominant side: Secondary | ICD-10-CM | POA: Diagnosis not present

## 2020-08-24 ENCOUNTER — Other Ambulatory Visit: Payer: Self-pay | Admitting: Physical Medicine and Rehabilitation

## 2020-08-24 DIAGNOSIS — I63232 Cerebral infarction due to unspecified occlusion or stenosis of left carotid arteries: Secondary | ICD-10-CM | POA: Diagnosis not present

## 2020-08-24 DIAGNOSIS — I69391 Dysphagia following cerebral infarction: Secondary | ICD-10-CM | POA: Diagnosis not present

## 2020-08-24 DIAGNOSIS — I6602 Occlusion and stenosis of left middle cerebral artery: Secondary | ICD-10-CM | POA: Diagnosis not present

## 2020-08-27 DIAGNOSIS — E119 Type 2 diabetes mellitus without complications: Secondary | ICD-10-CM | POA: Diagnosis not present

## 2020-08-27 DIAGNOSIS — E785 Hyperlipidemia, unspecified: Secondary | ICD-10-CM | POA: Diagnosis not present

## 2020-08-27 DIAGNOSIS — I69351 Hemiplegia and hemiparesis following cerebral infarction affecting right dominant side: Secondary | ICD-10-CM | POA: Diagnosis not present

## 2020-08-27 DIAGNOSIS — H409 Unspecified glaucoma: Secondary | ICD-10-CM | POA: Diagnosis not present

## 2020-08-27 DIAGNOSIS — I69320 Aphasia following cerebral infarction: Secondary | ICD-10-CM | POA: Diagnosis not present

## 2020-08-27 DIAGNOSIS — I10 Essential (primary) hypertension: Secondary | ICD-10-CM | POA: Diagnosis not present

## 2020-08-27 DIAGNOSIS — I69392 Facial weakness following cerebral infarction: Secondary | ICD-10-CM | POA: Diagnosis not present

## 2020-08-27 DIAGNOSIS — Z7982 Long term (current) use of aspirin: Secondary | ICD-10-CM | POA: Diagnosis not present

## 2020-08-27 DIAGNOSIS — R1310 Dysphagia, unspecified: Secondary | ICD-10-CM | POA: Diagnosis not present

## 2020-08-27 DIAGNOSIS — I6521 Occlusion and stenosis of right carotid artery: Secondary | ICD-10-CM | POA: Diagnosis not present

## 2020-08-27 NOTE — Progress Notes (Signed)
I agree with the above plan 

## 2020-08-28 ENCOUNTER — Other Ambulatory Visit (INDEPENDENT_AMBULATORY_CARE_PROVIDER_SITE_OTHER): Payer: Self-pay

## 2020-08-28 DIAGNOSIS — R1310 Dysphagia, unspecified: Secondary | ICD-10-CM | POA: Diagnosis not present

## 2020-08-28 DIAGNOSIS — I69320 Aphasia following cerebral infarction: Secondary | ICD-10-CM | POA: Diagnosis not present

## 2020-08-28 DIAGNOSIS — I69351 Hemiplegia and hemiparesis following cerebral infarction affecting right dominant side: Secondary | ICD-10-CM | POA: Diagnosis not present

## 2020-08-28 DIAGNOSIS — H409 Unspecified glaucoma: Secondary | ICD-10-CM | POA: Diagnosis not present

## 2020-08-28 DIAGNOSIS — I6521 Occlusion and stenosis of right carotid artery: Secondary | ICD-10-CM | POA: Diagnosis not present

## 2020-08-28 DIAGNOSIS — I69392 Facial weakness following cerebral infarction: Secondary | ICD-10-CM | POA: Diagnosis not present

## 2020-08-28 DIAGNOSIS — Z7982 Long term (current) use of aspirin: Secondary | ICD-10-CM | POA: Diagnosis not present

## 2020-08-28 DIAGNOSIS — I10 Essential (primary) hypertension: Secondary | ICD-10-CM | POA: Diagnosis not present

## 2020-08-28 DIAGNOSIS — E119 Type 2 diabetes mellitus without complications: Secondary | ICD-10-CM | POA: Diagnosis not present

## 2020-08-28 DIAGNOSIS — E785 Hyperlipidemia, unspecified: Secondary | ICD-10-CM | POA: Diagnosis not present

## 2020-08-29 DIAGNOSIS — I69320 Aphasia following cerebral infarction: Secondary | ICD-10-CM | POA: Diagnosis not present

## 2020-08-29 DIAGNOSIS — I10 Essential (primary) hypertension: Secondary | ICD-10-CM | POA: Diagnosis not present

## 2020-08-29 DIAGNOSIS — E785 Hyperlipidemia, unspecified: Secondary | ICD-10-CM | POA: Diagnosis not present

## 2020-08-29 DIAGNOSIS — I6521 Occlusion and stenosis of right carotid artery: Secondary | ICD-10-CM | POA: Diagnosis not present

## 2020-08-29 DIAGNOSIS — Z7982 Long term (current) use of aspirin: Secondary | ICD-10-CM | POA: Diagnosis not present

## 2020-08-29 DIAGNOSIS — I69351 Hemiplegia and hemiparesis following cerebral infarction affecting right dominant side: Secondary | ICD-10-CM | POA: Diagnosis not present

## 2020-08-29 DIAGNOSIS — H409 Unspecified glaucoma: Secondary | ICD-10-CM | POA: Diagnosis not present

## 2020-08-29 DIAGNOSIS — E119 Type 2 diabetes mellitus without complications: Secondary | ICD-10-CM | POA: Diagnosis not present

## 2020-08-29 DIAGNOSIS — R1310 Dysphagia, unspecified: Secondary | ICD-10-CM | POA: Diagnosis not present

## 2020-08-29 DIAGNOSIS — I69392 Facial weakness following cerebral infarction: Secondary | ICD-10-CM | POA: Diagnosis not present

## 2020-09-03 DIAGNOSIS — I69351 Hemiplegia and hemiparesis following cerebral infarction affecting right dominant side: Secondary | ICD-10-CM | POA: Diagnosis not present

## 2020-09-03 DIAGNOSIS — E119 Type 2 diabetes mellitus without complications: Secondary | ICD-10-CM | POA: Diagnosis not present

## 2020-09-03 DIAGNOSIS — Z7982 Long term (current) use of aspirin: Secondary | ICD-10-CM | POA: Diagnosis not present

## 2020-09-03 DIAGNOSIS — H409 Unspecified glaucoma: Secondary | ICD-10-CM | POA: Diagnosis not present

## 2020-09-03 DIAGNOSIS — I6521 Occlusion and stenosis of right carotid artery: Secondary | ICD-10-CM | POA: Diagnosis not present

## 2020-09-03 DIAGNOSIS — I69320 Aphasia following cerebral infarction: Secondary | ICD-10-CM | POA: Diagnosis not present

## 2020-09-03 DIAGNOSIS — R1310 Dysphagia, unspecified: Secondary | ICD-10-CM | POA: Diagnosis not present

## 2020-09-03 DIAGNOSIS — I10 Essential (primary) hypertension: Secondary | ICD-10-CM | POA: Diagnosis not present

## 2020-09-03 DIAGNOSIS — I69392 Facial weakness following cerebral infarction: Secondary | ICD-10-CM | POA: Diagnosis not present

## 2020-09-03 DIAGNOSIS — E785 Hyperlipidemia, unspecified: Secondary | ICD-10-CM | POA: Diagnosis not present

## 2020-09-04 ENCOUNTER — Ambulatory Visit: Payer: Medicare HMO | Admitting: Neurology

## 2020-09-04 NOTE — Telephone Encounter (Signed)
Sharon Gilbert can you address this?

## 2020-09-04 NOTE — Telephone Encounter (Signed)
Message initially sent to [name] as MyChart message sent prior to initial hospital follow-up visit.  This message was addressed during hospital follow-up visit.  Thank you.

## 2020-09-05 DIAGNOSIS — R1310 Dysphagia, unspecified: Secondary | ICD-10-CM | POA: Diagnosis not present

## 2020-09-05 DIAGNOSIS — H409 Unspecified glaucoma: Secondary | ICD-10-CM | POA: Diagnosis not present

## 2020-09-05 DIAGNOSIS — I69320 Aphasia following cerebral infarction: Secondary | ICD-10-CM | POA: Diagnosis not present

## 2020-09-05 DIAGNOSIS — E785 Hyperlipidemia, unspecified: Secondary | ICD-10-CM | POA: Diagnosis not present

## 2020-09-05 DIAGNOSIS — I69351 Hemiplegia and hemiparesis following cerebral infarction affecting right dominant side: Secondary | ICD-10-CM | POA: Diagnosis not present

## 2020-09-05 DIAGNOSIS — I69392 Facial weakness following cerebral infarction: Secondary | ICD-10-CM | POA: Diagnosis not present

## 2020-09-05 DIAGNOSIS — I6521 Occlusion and stenosis of right carotid artery: Secondary | ICD-10-CM | POA: Diagnosis not present

## 2020-09-05 DIAGNOSIS — I10 Essential (primary) hypertension: Secondary | ICD-10-CM | POA: Diagnosis not present

## 2020-09-05 DIAGNOSIS — E119 Type 2 diabetes mellitus without complications: Secondary | ICD-10-CM | POA: Diagnosis not present

## 2020-09-05 DIAGNOSIS — Z7982 Long term (current) use of aspirin: Secondary | ICD-10-CM | POA: Diagnosis not present

## 2020-09-07 ENCOUNTER — Other Ambulatory Visit: Payer: Self-pay

## 2020-09-07 ENCOUNTER — Ambulatory Visit (HOSPITAL_COMMUNITY)
Admission: RE | Admit: 2020-09-07 | Discharge: 2020-09-07 | Disposition: A | Discharge status: Home / Self Care | Payer: Medicare HMO | Source: Ambulatory Visit | Attending: Interventional Radiology | Admitting: Interventional Radiology

## 2020-09-07 DIAGNOSIS — I6522 Occlusion and stenosis of left carotid artery: Secondary | ICD-10-CM | POA: Diagnosis not present

## 2020-09-07 DIAGNOSIS — I639 Cerebral infarction, unspecified: Secondary | ICD-10-CM

## 2020-09-07 DIAGNOSIS — I63512 Cerebral infarction due to unspecified occlusion or stenosis of left middle cerebral artery: Secondary | ICD-10-CM | POA: Diagnosis not present

## 2020-09-07 DIAGNOSIS — Z9889 Other specified postprocedural states: Secondary | ICD-10-CM | POA: Diagnosis not present

## 2020-09-10 DIAGNOSIS — E119 Type 2 diabetes mellitus without complications: Secondary | ICD-10-CM | POA: Diagnosis not present

## 2020-09-10 DIAGNOSIS — E785 Hyperlipidemia, unspecified: Secondary | ICD-10-CM | POA: Diagnosis not present

## 2020-09-10 DIAGNOSIS — H409 Unspecified glaucoma: Secondary | ICD-10-CM | POA: Diagnosis not present

## 2020-09-10 DIAGNOSIS — I69351 Hemiplegia and hemiparesis following cerebral infarction affecting right dominant side: Secondary | ICD-10-CM | POA: Diagnosis not present

## 2020-09-10 DIAGNOSIS — I10 Essential (primary) hypertension: Secondary | ICD-10-CM | POA: Diagnosis not present

## 2020-09-10 DIAGNOSIS — R1310 Dysphagia, unspecified: Secondary | ICD-10-CM | POA: Diagnosis not present

## 2020-09-10 DIAGNOSIS — I69320 Aphasia following cerebral infarction: Secondary | ICD-10-CM | POA: Diagnosis not present

## 2020-09-10 DIAGNOSIS — I69392 Facial weakness following cerebral infarction: Secondary | ICD-10-CM | POA: Diagnosis not present

## 2020-09-10 DIAGNOSIS — Z7982 Long term (current) use of aspirin: Secondary | ICD-10-CM | POA: Diagnosis not present

## 2020-09-10 DIAGNOSIS — I6521 Occlusion and stenosis of right carotid artery: Secondary | ICD-10-CM | POA: Diagnosis not present

## 2020-09-11 DIAGNOSIS — I69320 Aphasia following cerebral infarction: Secondary | ICD-10-CM | POA: Diagnosis not present

## 2020-09-11 DIAGNOSIS — Z7982 Long term (current) use of aspirin: Secondary | ICD-10-CM | POA: Diagnosis not present

## 2020-09-11 DIAGNOSIS — I69392 Facial weakness following cerebral infarction: Secondary | ICD-10-CM | POA: Diagnosis not present

## 2020-09-11 DIAGNOSIS — I69351 Hemiplegia and hemiparesis following cerebral infarction affecting right dominant side: Secondary | ICD-10-CM | POA: Diagnosis not present

## 2020-09-11 DIAGNOSIS — H409 Unspecified glaucoma: Secondary | ICD-10-CM | POA: Diagnosis not present

## 2020-09-11 DIAGNOSIS — E785 Hyperlipidemia, unspecified: Secondary | ICD-10-CM | POA: Diagnosis not present

## 2020-09-11 DIAGNOSIS — R1310 Dysphagia, unspecified: Secondary | ICD-10-CM | POA: Diagnosis not present

## 2020-09-11 DIAGNOSIS — I10 Essential (primary) hypertension: Secondary | ICD-10-CM | POA: Diagnosis not present

## 2020-09-11 DIAGNOSIS — I6521 Occlusion and stenosis of right carotid artery: Secondary | ICD-10-CM | POA: Diagnosis not present

## 2020-09-11 DIAGNOSIS — E119 Type 2 diabetes mellitus without complications: Secondary | ICD-10-CM | POA: Diagnosis not present

## 2020-09-13 ENCOUNTER — Other Ambulatory Visit: Payer: Self-pay | Admitting: Physical Medicine and Rehabilitation

## 2020-09-13 DIAGNOSIS — Z7982 Long term (current) use of aspirin: Secondary | ICD-10-CM | POA: Diagnosis not present

## 2020-09-13 DIAGNOSIS — I6521 Occlusion and stenosis of right carotid artery: Secondary | ICD-10-CM | POA: Diagnosis not present

## 2020-09-13 DIAGNOSIS — E785 Hyperlipidemia, unspecified: Secondary | ICD-10-CM | POA: Diagnosis not present

## 2020-09-13 DIAGNOSIS — I10 Essential (primary) hypertension: Secondary | ICD-10-CM | POA: Diagnosis not present

## 2020-09-13 DIAGNOSIS — E119 Type 2 diabetes mellitus without complications: Secondary | ICD-10-CM | POA: Diagnosis not present

## 2020-09-13 DIAGNOSIS — I69320 Aphasia following cerebral infarction: Secondary | ICD-10-CM | POA: Diagnosis not present

## 2020-09-13 DIAGNOSIS — I69392 Facial weakness following cerebral infarction: Secondary | ICD-10-CM | POA: Diagnosis not present

## 2020-09-13 DIAGNOSIS — H409 Unspecified glaucoma: Secondary | ICD-10-CM | POA: Diagnosis not present

## 2020-09-13 DIAGNOSIS — R1310 Dysphagia, unspecified: Secondary | ICD-10-CM | POA: Diagnosis not present

## 2020-09-13 DIAGNOSIS — I69351 Hemiplegia and hemiparesis following cerebral infarction affecting right dominant side: Secondary | ICD-10-CM | POA: Diagnosis not present

## 2020-09-14 ENCOUNTER — Other Ambulatory Visit: Payer: Self-pay | Admitting: Physical Medicine and Rehabilitation

## 2020-09-17 ENCOUNTER — Encounter: Payer: Self-pay | Admitting: Physical Medicine & Rehabilitation

## 2020-09-17 ENCOUNTER — Encounter: Payer: Medicare HMO | Attending: Physical Medicine & Rehabilitation | Admitting: Physical Medicine & Rehabilitation

## 2020-09-17 ENCOUNTER — Other Ambulatory Visit: Payer: Self-pay

## 2020-09-17 VITALS — BP 149/84 | HR 62 | Temp 98.7°F | Ht 63.0 in | Wt 155.0 lb

## 2020-09-17 DIAGNOSIS — G8191 Hemiplegia, unspecified affecting right dominant side: Secondary | ICD-10-CM | POA: Diagnosis not present

## 2020-09-17 DIAGNOSIS — I10 Essential (primary) hypertension: Secondary | ICD-10-CM

## 2020-09-17 DIAGNOSIS — I69391 Dysphagia following cerebral infarction: Secondary | ICD-10-CM | POA: Diagnosis not present

## 2020-09-17 DIAGNOSIS — I63232 Cerebral infarction due to unspecified occlusion or stenosis of left carotid arteries: Secondary | ICD-10-CM | POA: Diagnosis not present

## 2020-09-17 MED ORDER — LISINOPRIL 10 MG PO TABS: 5.0000 mg | ORAL_TABLET | Freq: Every day | ORAL | 0 refills | Status: AC

## 2020-09-17 NOTE — Progress Notes (Signed)
Subjective:    Patient ID: Sharon Gilbert, female    DOB: Apr 03, 1941, 79 y.o.   MRN: [id number]  HPI RH-female with history of HTN, T2DM--diet controlled?, Glaucoma; presents for follow up for acute ischemic left ICA stroke.  Last clinic visit on 08/06/20.  Son supplements history, but does not stay with her. Since that time, unclear if patient is wearing WHO. She is in therapies. Blood pressure is within reference range. She saw [facility], with future plans to follow up with for stenting per son. She is still on modified diet.  Bowel movements are regular.   Pain Inventory Average Pain 0 Pain Right Now 1 My pain is dull and aching  LOCATION OF PAIN  SHOULDER, UPPER BACK  BOWEL Number of stools per week: 3 Oral laxative use No  Type of laxative  Enema or suppository use No  History of colostomy No  Incontinent No   BLADDER Normal In and out cath, frequency  Able to self cath n/a Bladder incontinence No  Frequent urination No  Leakage with coughing No  Difficulty starting stream No  Incomplete bladder emptying No    Mobility walk without assistance  Function retired I need assistance with the following:  feeding, bathing, meal prep, household duties and shopping  Neuro/Psych trouble walking  Prior Studies Any changes since last visit?  no  Physicians involved in your care Any changes since last visit?  no   No pertinent family history of premature CVA. Social History   Socioeconomic History  . Marital status: Married    Spouse name: Not on file  . Number of children: Not on file  . Years of education: Not on file  . Highest education level: Not on file  Occupational History  . Not on file  Tobacco Use  . Smoking status: Never Smoker  . Smokeless tobacco: Never Used  Substance and Sexual Activity  . Alcohol use: No  . Drug use: No  . Sexual activity: Yes  Other Topics Concern  . Not on file  Social History Narrative  . Not on file   Social  Determinants of Health   Financial Resource Strain: Not on file  Food Insecurity: Not on file  Transportation Needs: Not on file  Physical Activity: Not on file  Stress: Not on file  Social Connections: Not on file   Past Surgical History:  Procedure Laterality Date  . IR CT HEAD LTD  07/04/2020  . IR INTRAVSC STENT CERV CAROTID W/O EMB-PROT MOD SED INC ANGIO  07/04/2020  . IR PERCUTANEOUS ART THROMBECTOMY/INFUSION INTRACRANIAL INC DIAG ANGIO  07/04/2020  . RADIOLOGY WITH ANESTHESIA N/A 07/04/2020   Procedure: IR WITH ANESTHESIA;  Surgeon: Radiologist, Medication, MD;  Location: [facility] OR;  Service: Radiology;  Laterality: N/A;   Past Medical History:  Diagnosis Date  . Cataract   . Diabetes mellitus without complication (HCC)    BP (!) 149/84   Pulse 62   Temp 98.7 F (37.1 C)   Ht 5\' 3"  (1.6 m)   Wt 155 lb (70.3 kg)   SpO2 98%   BMI 27.46 kg/m   Opioid Risk Score:   Fall Risk Score:  `1  Depression screen PHQ 2/9  No flowsheet data found.  Review of Systems  Unable to perform ROS: Other  Constitutional: Negative.   HENT: Negative.   Eyes: Negative.   Respiratory: Negative.   Cardiovascular: Negative.   Gastrointestinal: Negative.   Endocrine: Negative.   Genitourinary: Negative.   Musculoskeletal:  Positive for gait problem.  Skin: Negative.   Allergic/Immunologic: Negative.   Neurological: Positive for weakness and numbness.  Hematological: Negative.   Psychiatric/Behavioral: Negative.   All other systems reviewed and are negative. Limited due to aphasia     Objective:   Physical Exam  Constitutional: No distress . Vital signs reviewed. HENT: Normocephalic.  Atraumatic. Eyes: EOMI. No discharge. Cardiovascular: No JVD.   Respiratory: Normal effort.  No stridor.   GI: Non-distended.   Skin: Warm and dry.  Intact. Psych: Normal mood.  Normal behavior. Musc: No edema in extremities.  No tenderness in extremities. Neuro: Alert Global aphasia - expressive  aphasia, improving Motor: RUE: Shoulder abduction, elbow flexion/extension 4+/5, wrist extension 4+/5, 4+/5  RLE: 4+/5 proximal to distal No increase in tone appreciated    Assessment & Plan:  RH-female with history of HTN, T2DM--diet controlled?, Glaucoma; presents for follow up for acute ischemic left ICA stroke.  1. Global aphasia, right hemiparesis with impaired mobility and ADLs secondary to acute ischemic left ICA stroke             Encouraged WHO nightly again             Cont therapies  Continue follow up with Neuro, VIR  Educated on exercise/excercist tolerate  2. HTN:   High graded R-ICA stenosis.   SBP goals 130-150 range.   Within range today, lower normally, values reviewed - educated on avoiding hypotension, particularly given stenosis.  Lisinopril decreased to 5mg .  Cont meds  Plans for ?intervention with VIR  3.  Post stroke dysphagia  Cont therapies             Continue D3 thins  4.  Slow transit constipation             Encouraged meds             Encourage prune juice  Improving

## 2020-09-18 DIAGNOSIS — Z7982 Long term (current) use of aspirin: Secondary | ICD-10-CM | POA: Diagnosis not present

## 2020-09-18 DIAGNOSIS — E785 Hyperlipidemia, unspecified: Secondary | ICD-10-CM | POA: Diagnosis not present

## 2020-09-18 DIAGNOSIS — I69392 Facial weakness following cerebral infarction: Secondary | ICD-10-CM | POA: Diagnosis not present

## 2020-09-18 DIAGNOSIS — R1310 Dysphagia, unspecified: Secondary | ICD-10-CM | POA: Diagnosis not present

## 2020-09-18 DIAGNOSIS — E119 Type 2 diabetes mellitus without complications: Secondary | ICD-10-CM | POA: Diagnosis not present

## 2020-09-18 DIAGNOSIS — I69320 Aphasia following cerebral infarction: Secondary | ICD-10-CM | POA: Diagnosis not present

## 2020-09-18 DIAGNOSIS — I69351 Hemiplegia and hemiparesis following cerebral infarction affecting right dominant side: Secondary | ICD-10-CM | POA: Diagnosis not present

## 2020-09-18 DIAGNOSIS — H409 Unspecified glaucoma: Secondary | ICD-10-CM | POA: Diagnosis not present

## 2020-09-18 DIAGNOSIS — I6521 Occlusion and stenosis of right carotid artery: Secondary | ICD-10-CM | POA: Diagnosis not present

## 2020-09-18 DIAGNOSIS — I10 Essential (primary) hypertension: Secondary | ICD-10-CM | POA: Diagnosis not present

## 2020-09-19 DIAGNOSIS — I69320 Aphasia following cerebral infarction: Secondary | ICD-10-CM | POA: Diagnosis not present

## 2020-09-19 DIAGNOSIS — I6521 Occlusion and stenosis of right carotid artery: Secondary | ICD-10-CM | POA: Diagnosis not present

## 2020-09-19 DIAGNOSIS — H409 Unspecified glaucoma: Secondary | ICD-10-CM | POA: Diagnosis not present

## 2020-09-19 DIAGNOSIS — I69351 Hemiplegia and hemiparesis following cerebral infarction affecting right dominant side: Secondary | ICD-10-CM | POA: Diagnosis not present

## 2020-09-19 DIAGNOSIS — I10 Essential (primary) hypertension: Secondary | ICD-10-CM | POA: Diagnosis not present

## 2020-09-19 DIAGNOSIS — E119 Type 2 diabetes mellitus without complications: Secondary | ICD-10-CM | POA: Diagnosis not present

## 2020-09-19 DIAGNOSIS — I69392 Facial weakness following cerebral infarction: Secondary | ICD-10-CM | POA: Diagnosis not present

## 2020-09-19 DIAGNOSIS — E785 Hyperlipidemia, unspecified: Secondary | ICD-10-CM | POA: Diagnosis not present

## 2020-09-19 DIAGNOSIS — Z7982 Long term (current) use of aspirin: Secondary | ICD-10-CM | POA: Diagnosis not present

## 2020-09-19 DIAGNOSIS — R1310 Dysphagia, unspecified: Secondary | ICD-10-CM | POA: Diagnosis not present

## 2020-09-20 DIAGNOSIS — Z7982 Long term (current) use of aspirin: Secondary | ICD-10-CM | POA: Diagnosis not present

## 2020-09-20 DIAGNOSIS — E119 Type 2 diabetes mellitus without complications: Secondary | ICD-10-CM | POA: Diagnosis not present

## 2020-09-20 DIAGNOSIS — I69351 Hemiplegia and hemiparesis following cerebral infarction affecting right dominant side: Secondary | ICD-10-CM | POA: Diagnosis not present

## 2020-09-20 DIAGNOSIS — R1310 Dysphagia, unspecified: Secondary | ICD-10-CM | POA: Diagnosis not present

## 2020-09-20 DIAGNOSIS — E785 Hyperlipidemia, unspecified: Secondary | ICD-10-CM | POA: Diagnosis not present

## 2020-09-20 DIAGNOSIS — I6521 Occlusion and stenosis of right carotid artery: Secondary | ICD-10-CM | POA: Diagnosis not present

## 2020-09-20 DIAGNOSIS — I69320 Aphasia following cerebral infarction: Secondary | ICD-10-CM | POA: Diagnosis not present

## 2020-09-20 DIAGNOSIS — I69392 Facial weakness following cerebral infarction: Secondary | ICD-10-CM | POA: Diagnosis not present

## 2020-09-20 DIAGNOSIS — H409 Unspecified glaucoma: Secondary | ICD-10-CM | POA: Diagnosis not present

## 2020-09-20 DIAGNOSIS — I10 Essential (primary) hypertension: Secondary | ICD-10-CM | POA: Diagnosis not present

## 2020-09-25 DIAGNOSIS — H9325 Central auditory processing disorder: Secondary | ICD-10-CM | POA: Diagnosis not present

## 2020-09-25 DIAGNOSIS — I69391 Dysphagia following cerebral infarction: Secondary | ICD-10-CM | POA: Diagnosis not present

## 2020-09-25 DIAGNOSIS — R1310 Dysphagia, unspecified: Secondary | ICD-10-CM | POA: Diagnosis not present

## 2020-09-25 DIAGNOSIS — I69322 Dysarthria following cerebral infarction: Secondary | ICD-10-CM | POA: Diagnosis not present

## 2020-10-01 HISTORY — PX: IR RADIOLOGIST EVAL & MGMT: IMG5224

## 2020-10-09 ENCOUNTER — Other Ambulatory Visit: Payer: Self-pay

## 2020-10-09 NOTE — Patient Outreach (Signed)
[facility]) Care Management  10/09/2020  GERRIANNE AYDELOTT [birth year] [id number]   Telephone outreach to patient to obtain mRS was successfully completed. MRS=3  Thank you, Vanice Sarah [facility] Care Management Assistant

## 2020-10-10 ENCOUNTER — Other Ambulatory Visit: Payer: Self-pay | Admitting: Physical Medicine & Rehabilitation

## 2020-10-18 DIAGNOSIS — I69392 Facial weakness following cerebral infarction: Secondary | ICD-10-CM | POA: Diagnosis not present

## 2020-10-18 DIAGNOSIS — R1310 Dysphagia, unspecified: Secondary | ICD-10-CM | POA: Diagnosis not present

## 2020-10-18 DIAGNOSIS — I69351 Hemiplegia and hemiparesis following cerebral infarction affecting right dominant side: Secondary | ICD-10-CM | POA: Diagnosis not present

## 2020-10-18 DIAGNOSIS — H9325 Central auditory processing disorder: Secondary | ICD-10-CM | POA: Diagnosis not present

## 2020-10-18 DIAGNOSIS — I6521 Occlusion and stenosis of right carotid artery: Secondary | ICD-10-CM | POA: Diagnosis not present

## 2020-10-18 DIAGNOSIS — I69391 Dysphagia following cerebral infarction: Secondary | ICD-10-CM | POA: Diagnosis not present

## 2020-10-18 DIAGNOSIS — I69390 Apraxia following cerebral infarction: Secondary | ICD-10-CM | POA: Diagnosis not present

## 2020-10-18 DIAGNOSIS — I69398 Other sequelae of cerebral infarction: Secondary | ICD-10-CM | POA: Diagnosis not present

## 2020-10-18 DIAGNOSIS — I69322 Dysarthria following cerebral infarction: Secondary | ICD-10-CM | POA: Diagnosis not present

## 2020-10-18 DIAGNOSIS — I69320 Aphasia following cerebral infarction: Secondary | ICD-10-CM | POA: Diagnosis not present

## 2020-10-29 ENCOUNTER — Other Ambulatory Visit: Payer: Self-pay

## 2020-10-29 NOTE — Telephone Encounter (Signed)
Denying request. Called pharmacy and asked to send to pt PCP Pearson Grippe, MD. Patient is aware of change.

## 2020-11-07 DIAGNOSIS — I69390 Apraxia following cerebral infarction: Secondary | ICD-10-CM | POA: Diagnosis not present

## 2020-11-07 DIAGNOSIS — H9325 Central auditory processing disorder: Secondary | ICD-10-CM | POA: Diagnosis not present

## 2020-11-07 DIAGNOSIS — I6521 Occlusion and stenosis of right carotid artery: Secondary | ICD-10-CM | POA: Diagnosis not present

## 2020-11-07 DIAGNOSIS — I69351 Hemiplegia and hemiparesis following cerebral infarction affecting right dominant side: Secondary | ICD-10-CM | POA: Diagnosis not present

## 2020-11-07 DIAGNOSIS — I69322 Dysarthria following cerebral infarction: Secondary | ICD-10-CM | POA: Diagnosis not present

## 2020-11-07 DIAGNOSIS — I69391 Dysphagia following cerebral infarction: Secondary | ICD-10-CM | POA: Diagnosis not present

## 2020-11-07 DIAGNOSIS — I69320 Aphasia following cerebral infarction: Secondary | ICD-10-CM | POA: Diagnosis not present

## 2020-11-07 DIAGNOSIS — R1310 Dysphagia, unspecified: Secondary | ICD-10-CM | POA: Diagnosis not present

## 2020-11-07 DIAGNOSIS — I69398 Other sequelae of cerebral infarction: Secondary | ICD-10-CM | POA: Diagnosis not present

## 2020-11-07 DIAGNOSIS — I69392 Facial weakness following cerebral infarction: Secondary | ICD-10-CM | POA: Diagnosis not present

## 2020-11-08 DIAGNOSIS — H9325 Central auditory processing disorder: Secondary | ICD-10-CM | POA: Diagnosis not present

## 2020-11-08 DIAGNOSIS — I6521 Occlusion and stenosis of right carotid artery: Secondary | ICD-10-CM | POA: Diagnosis not present

## 2020-11-08 DIAGNOSIS — I69398 Other sequelae of cerebral infarction: Secondary | ICD-10-CM | POA: Diagnosis not present

## 2020-11-08 DIAGNOSIS — I69391 Dysphagia following cerebral infarction: Secondary | ICD-10-CM | POA: Diagnosis not present

## 2020-11-08 DIAGNOSIS — I69320 Aphasia following cerebral infarction: Secondary | ICD-10-CM | POA: Diagnosis not present

## 2020-11-08 DIAGNOSIS — I69322 Dysarthria following cerebral infarction: Secondary | ICD-10-CM | POA: Diagnosis not present

## 2020-11-08 DIAGNOSIS — I69390 Apraxia following cerebral infarction: Secondary | ICD-10-CM | POA: Diagnosis not present

## 2020-11-08 DIAGNOSIS — R1310 Dysphagia, unspecified: Secondary | ICD-10-CM | POA: Diagnosis not present

## 2020-11-08 DIAGNOSIS — I69392 Facial weakness following cerebral infarction: Secondary | ICD-10-CM | POA: Diagnosis not present

## 2020-11-08 DIAGNOSIS — I69351 Hemiplegia and hemiparesis following cerebral infarction affecting right dominant side: Secondary | ICD-10-CM | POA: Diagnosis not present

## 2020-11-09 DIAGNOSIS — I69322 Dysarthria following cerebral infarction: Secondary | ICD-10-CM | POA: Diagnosis not present

## 2020-11-09 DIAGNOSIS — I69398 Other sequelae of cerebral infarction: Secondary | ICD-10-CM | POA: Diagnosis not present

## 2020-11-09 DIAGNOSIS — R1310 Dysphagia, unspecified: Secondary | ICD-10-CM | POA: Diagnosis not present

## 2020-11-09 DIAGNOSIS — I6521 Occlusion and stenosis of right carotid artery: Secondary | ICD-10-CM | POA: Diagnosis not present

## 2020-11-09 DIAGNOSIS — I69351 Hemiplegia and hemiparesis following cerebral infarction affecting right dominant side: Secondary | ICD-10-CM | POA: Diagnosis not present

## 2020-11-09 DIAGNOSIS — I69392 Facial weakness following cerebral infarction: Secondary | ICD-10-CM | POA: Diagnosis not present

## 2020-11-09 DIAGNOSIS — H9325 Central auditory processing disorder: Secondary | ICD-10-CM | POA: Diagnosis not present

## 2020-11-09 DIAGNOSIS — I69391 Dysphagia following cerebral infarction: Secondary | ICD-10-CM | POA: Diagnosis not present

## 2020-11-09 DIAGNOSIS — I69390 Apraxia following cerebral infarction: Secondary | ICD-10-CM | POA: Diagnosis not present

## 2020-11-09 DIAGNOSIS — I69320 Aphasia following cerebral infarction: Secondary | ICD-10-CM | POA: Diagnosis not present

## 2020-11-13 DIAGNOSIS — E1169 Type 2 diabetes mellitus with other specified complication: Secondary | ICD-10-CM | POA: Diagnosis not present

## 2020-11-13 DIAGNOSIS — M1990 Unspecified osteoarthritis, unspecified site: Secondary | ICD-10-CM | POA: Diagnosis not present

## 2020-11-13 DIAGNOSIS — I10 Essential (primary) hypertension: Secondary | ICD-10-CM | POA: Diagnosis not present

## 2020-11-13 DIAGNOSIS — E785 Hyperlipidemia, unspecified: Secondary | ICD-10-CM | POA: Diagnosis not present

## 2020-11-13 DIAGNOSIS — Z79899 Other long term (current) drug therapy: Secondary | ICD-10-CM | POA: Diagnosis not present

## 2020-11-13 DIAGNOSIS — E7800 Pure hypercholesterolemia, unspecified: Secondary | ICD-10-CM | POA: Diagnosis not present

## 2020-11-13 DIAGNOSIS — I69320 Aphasia following cerebral infarction: Secondary | ICD-10-CM | POA: Diagnosis not present

## 2020-11-13 DIAGNOSIS — E119 Type 2 diabetes mellitus without complications: Secondary | ICD-10-CM | POA: Diagnosis not present

## 2020-11-13 DIAGNOSIS — R946 Abnormal results of thyroid function studies: Secondary | ICD-10-CM | POA: Diagnosis not present

## 2020-11-13 DIAGNOSIS — Z0000 Encounter for general adult medical examination without abnormal findings: Secondary | ICD-10-CM | POA: Diagnosis not present

## 2020-11-14 DIAGNOSIS — I69398 Other sequelae of cerebral infarction: Secondary | ICD-10-CM | POA: Diagnosis not present

## 2020-11-14 DIAGNOSIS — I69391 Dysphagia following cerebral infarction: Secondary | ICD-10-CM | POA: Diagnosis not present

## 2020-11-14 DIAGNOSIS — I69392 Facial weakness following cerebral infarction: Secondary | ICD-10-CM | POA: Diagnosis not present

## 2020-11-14 DIAGNOSIS — I69320 Aphasia following cerebral infarction: Secondary | ICD-10-CM | POA: Diagnosis not present

## 2020-11-14 DIAGNOSIS — I69351 Hemiplegia and hemiparesis following cerebral infarction affecting right dominant side: Secondary | ICD-10-CM | POA: Diagnosis not present

## 2020-11-14 DIAGNOSIS — H9325 Central auditory processing disorder: Secondary | ICD-10-CM | POA: Diagnosis not present

## 2020-11-14 DIAGNOSIS — I6521 Occlusion and stenosis of right carotid artery: Secondary | ICD-10-CM | POA: Diagnosis not present

## 2020-11-14 DIAGNOSIS — I69322 Dysarthria following cerebral infarction: Secondary | ICD-10-CM | POA: Diagnosis not present

## 2020-11-14 DIAGNOSIS — R1310 Dysphagia, unspecified: Secondary | ICD-10-CM | POA: Diagnosis not present

## 2020-11-14 DIAGNOSIS — I69390 Apraxia following cerebral infarction: Secondary | ICD-10-CM | POA: Diagnosis not present

## 2020-11-15 DIAGNOSIS — I69391 Dysphagia following cerebral infarction: Secondary | ICD-10-CM | POA: Diagnosis not present

## 2020-11-15 DIAGNOSIS — I69398 Other sequelae of cerebral infarction: Secondary | ICD-10-CM | POA: Diagnosis not present

## 2020-11-15 DIAGNOSIS — I69390 Apraxia following cerebral infarction: Secondary | ICD-10-CM | POA: Diagnosis not present

## 2020-11-15 DIAGNOSIS — I69392 Facial weakness following cerebral infarction: Secondary | ICD-10-CM | POA: Diagnosis not present

## 2020-11-15 DIAGNOSIS — H9325 Central auditory processing disorder: Secondary | ICD-10-CM | POA: Diagnosis not present

## 2020-11-15 DIAGNOSIS — I69322 Dysarthria following cerebral infarction: Secondary | ICD-10-CM | POA: Diagnosis not present

## 2020-11-15 DIAGNOSIS — R1310 Dysphagia, unspecified: Secondary | ICD-10-CM | POA: Diagnosis not present

## 2020-11-15 DIAGNOSIS — I69351 Hemiplegia and hemiparesis following cerebral infarction affecting right dominant side: Secondary | ICD-10-CM | POA: Diagnosis not present

## 2020-11-15 DIAGNOSIS — I6521 Occlusion and stenosis of right carotid artery: Secondary | ICD-10-CM | POA: Diagnosis not present

## 2020-11-15 DIAGNOSIS — I69320 Aphasia following cerebral infarction: Secondary | ICD-10-CM | POA: Diagnosis not present

## 2020-11-16 DIAGNOSIS — I6521 Occlusion and stenosis of right carotid artery: Secondary | ICD-10-CM | POA: Diagnosis not present

## 2020-11-16 DIAGNOSIS — I69322 Dysarthria following cerebral infarction: Secondary | ICD-10-CM | POA: Diagnosis not present

## 2020-11-16 DIAGNOSIS — I69320 Aphasia following cerebral infarction: Secondary | ICD-10-CM | POA: Diagnosis not present

## 2020-11-16 DIAGNOSIS — I69391 Dysphagia following cerebral infarction: Secondary | ICD-10-CM | POA: Diagnosis not present

## 2020-11-16 DIAGNOSIS — I69390 Apraxia following cerebral infarction: Secondary | ICD-10-CM | POA: Diagnosis not present

## 2020-11-16 DIAGNOSIS — I69398 Other sequelae of cerebral infarction: Secondary | ICD-10-CM | POA: Diagnosis not present

## 2020-11-16 DIAGNOSIS — R1310 Dysphagia, unspecified: Secondary | ICD-10-CM | POA: Diagnosis not present

## 2020-11-16 DIAGNOSIS — I69392 Facial weakness following cerebral infarction: Secondary | ICD-10-CM | POA: Diagnosis not present

## 2020-11-16 DIAGNOSIS — H9325 Central auditory processing disorder: Secondary | ICD-10-CM | POA: Diagnosis not present

## 2020-11-16 DIAGNOSIS — I69351 Hemiplegia and hemiparesis following cerebral infarction affecting right dominant side: Secondary | ICD-10-CM | POA: Diagnosis not present

## 2020-11-20 DIAGNOSIS — I69398 Other sequelae of cerebral infarction: Secondary | ICD-10-CM | POA: Diagnosis not present

## 2020-11-20 DIAGNOSIS — R1310 Dysphagia, unspecified: Secondary | ICD-10-CM | POA: Diagnosis not present

## 2020-11-20 DIAGNOSIS — I69392 Facial weakness following cerebral infarction: Secondary | ICD-10-CM | POA: Diagnosis not present

## 2020-11-20 DIAGNOSIS — I69320 Aphasia following cerebral infarction: Secondary | ICD-10-CM | POA: Diagnosis not present

## 2020-11-20 DIAGNOSIS — I69351 Hemiplegia and hemiparesis following cerebral infarction affecting right dominant side: Secondary | ICD-10-CM | POA: Diagnosis not present

## 2020-11-20 DIAGNOSIS — I6521 Occlusion and stenosis of right carotid artery: Secondary | ICD-10-CM | POA: Diagnosis not present

## 2020-11-20 DIAGNOSIS — H9325 Central auditory processing disorder: Secondary | ICD-10-CM | POA: Diagnosis not present

## 2020-11-20 DIAGNOSIS — I69391 Dysphagia following cerebral infarction: Secondary | ICD-10-CM | POA: Diagnosis not present

## 2020-11-20 DIAGNOSIS — I69390 Apraxia following cerebral infarction: Secondary | ICD-10-CM | POA: Diagnosis not present

## 2020-11-20 DIAGNOSIS — I69322 Dysarthria following cerebral infarction: Secondary | ICD-10-CM | POA: Diagnosis not present

## 2020-11-21 ENCOUNTER — Encounter: Payer: Medicare HMO | Attending: Physical Medicine & Rehabilitation | Admitting: Registered Nurse

## 2020-11-21 DIAGNOSIS — I69392 Facial weakness following cerebral infarction: Secondary | ICD-10-CM | POA: Diagnosis not present

## 2020-11-21 DIAGNOSIS — I69398 Other sequelae of cerebral infarction: Secondary | ICD-10-CM | POA: Diagnosis not present

## 2020-11-21 DIAGNOSIS — I69351 Hemiplegia and hemiparesis following cerebral infarction affecting right dominant side: Secondary | ICD-10-CM | POA: Diagnosis not present

## 2020-11-21 DIAGNOSIS — G8191 Hemiplegia, unspecified affecting right dominant side: Secondary | ICD-10-CM | POA: Insufficient documentation

## 2020-11-21 DIAGNOSIS — I6521 Occlusion and stenosis of right carotid artery: Secondary | ICD-10-CM | POA: Diagnosis not present

## 2020-11-21 DIAGNOSIS — I69390 Apraxia following cerebral infarction: Secondary | ICD-10-CM | POA: Diagnosis not present

## 2020-11-21 DIAGNOSIS — H9325 Central auditory processing disorder: Secondary | ICD-10-CM | POA: Diagnosis not present

## 2020-11-21 DIAGNOSIS — I69320 Aphasia following cerebral infarction: Secondary | ICD-10-CM | POA: Diagnosis not present

## 2020-11-21 DIAGNOSIS — I63232 Cerebral infarction due to unspecified occlusion or stenosis of left carotid arteries: Secondary | ICD-10-CM | POA: Insufficient documentation

## 2020-11-21 DIAGNOSIS — I69322 Dysarthria following cerebral infarction: Secondary | ICD-10-CM | POA: Diagnosis not present

## 2020-11-21 DIAGNOSIS — R1310 Dysphagia, unspecified: Secondary | ICD-10-CM | POA: Diagnosis not present

## 2020-11-21 DIAGNOSIS — I69391 Dysphagia following cerebral infarction: Secondary | ICD-10-CM | POA: Diagnosis not present

## 2020-11-21 DIAGNOSIS — I10 Essential (primary) hypertension: Secondary | ICD-10-CM | POA: Insufficient documentation

## 2020-11-22 DIAGNOSIS — I69320 Aphasia following cerebral infarction: Secondary | ICD-10-CM | POA: Diagnosis not present

## 2020-11-22 DIAGNOSIS — R1310 Dysphagia, unspecified: Secondary | ICD-10-CM | POA: Diagnosis not present

## 2020-11-22 DIAGNOSIS — I69351 Hemiplegia and hemiparesis following cerebral infarction affecting right dominant side: Secondary | ICD-10-CM | POA: Diagnosis not present

## 2020-11-22 DIAGNOSIS — H9325 Central auditory processing disorder: Secondary | ICD-10-CM | POA: Diagnosis not present

## 2020-11-22 DIAGNOSIS — R748 Abnormal levels of other serum enzymes: Secondary | ICD-10-CM | POA: Diagnosis not present

## 2020-11-22 DIAGNOSIS — I69322 Dysarthria following cerebral infarction: Secondary | ICD-10-CM | POA: Diagnosis not present

## 2020-11-22 DIAGNOSIS — I6521 Occlusion and stenosis of right carotid artery: Secondary | ICD-10-CM | POA: Diagnosis not present

## 2020-11-22 DIAGNOSIS — I69390 Apraxia following cerebral infarction: Secondary | ICD-10-CM | POA: Diagnosis not present

## 2020-11-22 DIAGNOSIS — K769 Liver disease, unspecified: Secondary | ICD-10-CM | POA: Diagnosis not present

## 2020-11-22 DIAGNOSIS — I69392 Facial weakness following cerebral infarction: Secondary | ICD-10-CM | POA: Diagnosis not present

## 2020-11-22 DIAGNOSIS — I69398 Other sequelae of cerebral infarction: Secondary | ICD-10-CM | POA: Diagnosis not present

## 2020-11-22 DIAGNOSIS — I69391 Dysphagia following cerebral infarction: Secondary | ICD-10-CM | POA: Diagnosis not present

## 2020-11-27 DIAGNOSIS — I69390 Apraxia following cerebral infarction: Secondary | ICD-10-CM | POA: Diagnosis not present

## 2020-11-27 DIAGNOSIS — I10 Essential (primary) hypertension: Secondary | ICD-10-CM | POA: Diagnosis not present

## 2020-11-27 DIAGNOSIS — E119 Type 2 diabetes mellitus without complications: Secondary | ICD-10-CM | POA: Diagnosis not present

## 2020-11-27 DIAGNOSIS — E785 Hyperlipidemia, unspecified: Secondary | ICD-10-CM | POA: Diagnosis not present

## 2020-11-28 ENCOUNTER — Ambulatory Visit: Payer: Medicare HMO | Admitting: Neurology

## 2020-11-29 DIAGNOSIS — I69398 Other sequelae of cerebral infarction: Secondary | ICD-10-CM | POA: Diagnosis not present

## 2020-11-29 DIAGNOSIS — I69390 Apraxia following cerebral infarction: Secondary | ICD-10-CM | POA: Diagnosis not present

## 2020-11-29 DIAGNOSIS — I69320 Aphasia following cerebral infarction: Secondary | ICD-10-CM | POA: Diagnosis not present

## 2020-11-29 DIAGNOSIS — I69351 Hemiplegia and hemiparesis following cerebral infarction affecting right dominant side: Secondary | ICD-10-CM | POA: Diagnosis not present

## 2020-11-29 DIAGNOSIS — E119 Type 2 diabetes mellitus without complications: Secondary | ICD-10-CM | POA: Diagnosis not present

## 2020-11-29 DIAGNOSIS — I69322 Dysarthria following cerebral infarction: Secondary | ICD-10-CM | POA: Diagnosis not present

## 2020-11-29 DIAGNOSIS — I69391 Dysphagia following cerebral infarction: Secondary | ICD-10-CM | POA: Diagnosis not present

## 2020-11-29 DIAGNOSIS — H9325 Central auditory processing disorder: Secondary | ICD-10-CM | POA: Diagnosis not present

## 2020-11-29 DIAGNOSIS — I6521 Occlusion and stenosis of right carotid artery: Secondary | ICD-10-CM | POA: Diagnosis not present

## 2020-11-29 DIAGNOSIS — I69392 Facial weakness following cerebral infarction: Secondary | ICD-10-CM | POA: Diagnosis not present

## 2020-11-30 DIAGNOSIS — E119 Type 2 diabetes mellitus without complications: Secondary | ICD-10-CM | POA: Diagnosis not present

## 2020-11-30 DIAGNOSIS — I6521 Occlusion and stenosis of right carotid artery: Secondary | ICD-10-CM | POA: Diagnosis not present

## 2020-11-30 DIAGNOSIS — I69322 Dysarthria following cerebral infarction: Secondary | ICD-10-CM | POA: Diagnosis not present

## 2020-11-30 DIAGNOSIS — H9325 Central auditory processing disorder: Secondary | ICD-10-CM | POA: Diagnosis not present

## 2020-11-30 DIAGNOSIS — I69391 Dysphagia following cerebral infarction: Secondary | ICD-10-CM | POA: Diagnosis not present

## 2020-11-30 DIAGNOSIS — I69392 Facial weakness following cerebral infarction: Secondary | ICD-10-CM | POA: Diagnosis not present

## 2020-11-30 DIAGNOSIS — I69320 Aphasia following cerebral infarction: Secondary | ICD-10-CM | POA: Diagnosis not present

## 2020-11-30 DIAGNOSIS — I69351 Hemiplegia and hemiparesis following cerebral infarction affecting right dominant side: Secondary | ICD-10-CM | POA: Diagnosis not present

## 2020-11-30 DIAGNOSIS — I69390 Apraxia following cerebral infarction: Secondary | ICD-10-CM | POA: Diagnosis not present

## 2020-11-30 DIAGNOSIS — I69398 Other sequelae of cerebral infarction: Secondary | ICD-10-CM | POA: Diagnosis not present

## 2020-12-03 DIAGNOSIS — R748 Abnormal levels of other serum enzymes: Secondary | ICD-10-CM | POA: Diagnosis not present

## 2020-12-04 DIAGNOSIS — H9325 Central auditory processing disorder: Secondary | ICD-10-CM | POA: Diagnosis not present

## 2020-12-04 DIAGNOSIS — I69390 Apraxia following cerebral infarction: Secondary | ICD-10-CM | POA: Diagnosis not present

## 2020-12-04 DIAGNOSIS — I69320 Aphasia following cerebral infarction: Secondary | ICD-10-CM | POA: Diagnosis not present

## 2020-12-04 DIAGNOSIS — E119 Type 2 diabetes mellitus without complications: Secondary | ICD-10-CM | POA: Diagnosis not present

## 2020-12-04 DIAGNOSIS — I6521 Occlusion and stenosis of right carotid artery: Secondary | ICD-10-CM | POA: Diagnosis not present

## 2020-12-04 DIAGNOSIS — I69322 Dysarthria following cerebral infarction: Secondary | ICD-10-CM | POA: Diagnosis not present

## 2020-12-04 DIAGNOSIS — I69392 Facial weakness following cerebral infarction: Secondary | ICD-10-CM | POA: Diagnosis not present

## 2020-12-04 DIAGNOSIS — I69391 Dysphagia following cerebral infarction: Secondary | ICD-10-CM | POA: Diagnosis not present

## 2020-12-04 DIAGNOSIS — I69351 Hemiplegia and hemiparesis following cerebral infarction affecting right dominant side: Secondary | ICD-10-CM | POA: Diagnosis not present

## 2020-12-04 DIAGNOSIS — I69398 Other sequelae of cerebral infarction: Secondary | ICD-10-CM | POA: Diagnosis not present

## 2020-12-05 DIAGNOSIS — I69391 Dysphagia following cerebral infarction: Secondary | ICD-10-CM | POA: Diagnosis not present

## 2020-12-05 DIAGNOSIS — I69390 Apraxia following cerebral infarction: Secondary | ICD-10-CM | POA: Diagnosis not present

## 2020-12-05 DIAGNOSIS — I69398 Other sequelae of cerebral infarction: Secondary | ICD-10-CM | POA: Diagnosis not present

## 2020-12-05 DIAGNOSIS — I69392 Facial weakness following cerebral infarction: Secondary | ICD-10-CM | POA: Diagnosis not present

## 2020-12-05 DIAGNOSIS — E119 Type 2 diabetes mellitus without complications: Secondary | ICD-10-CM | POA: Diagnosis not present

## 2020-12-05 DIAGNOSIS — I69351 Hemiplegia and hemiparesis following cerebral infarction affecting right dominant side: Secondary | ICD-10-CM | POA: Diagnosis not present

## 2020-12-05 DIAGNOSIS — H9325 Central auditory processing disorder: Secondary | ICD-10-CM | POA: Diagnosis not present

## 2020-12-05 DIAGNOSIS — I69320 Aphasia following cerebral infarction: Secondary | ICD-10-CM | POA: Diagnosis not present

## 2020-12-05 DIAGNOSIS — I6521 Occlusion and stenosis of right carotid artery: Secondary | ICD-10-CM | POA: Diagnosis not present

## 2020-12-05 DIAGNOSIS — I69322 Dysarthria following cerebral infarction: Secondary | ICD-10-CM | POA: Diagnosis not present

## 2020-12-06 DIAGNOSIS — I69320 Aphasia following cerebral infarction: Secondary | ICD-10-CM | POA: Diagnosis not present

## 2020-12-06 DIAGNOSIS — I69390 Apraxia following cerebral infarction: Secondary | ICD-10-CM | POA: Diagnosis not present

## 2020-12-06 DIAGNOSIS — I69322 Dysarthria following cerebral infarction: Secondary | ICD-10-CM | POA: Diagnosis not present

## 2020-12-06 DIAGNOSIS — H9325 Central auditory processing disorder: Secondary | ICD-10-CM | POA: Diagnosis not present

## 2020-12-06 DIAGNOSIS — I69392 Facial weakness following cerebral infarction: Secondary | ICD-10-CM | POA: Diagnosis not present

## 2020-12-06 DIAGNOSIS — I6521 Occlusion and stenosis of right carotid artery: Secondary | ICD-10-CM | POA: Diagnosis not present

## 2020-12-06 DIAGNOSIS — E119 Type 2 diabetes mellitus without complications: Secondary | ICD-10-CM | POA: Diagnosis not present

## 2020-12-06 DIAGNOSIS — H40022 Open angle with borderline findings, high risk, left eye: Secondary | ICD-10-CM | POA: Diagnosis not present

## 2020-12-06 DIAGNOSIS — I69351 Hemiplegia and hemiparesis following cerebral infarction affecting right dominant side: Secondary | ICD-10-CM | POA: Diagnosis not present

## 2020-12-06 DIAGNOSIS — I69398 Other sequelae of cerebral infarction: Secondary | ICD-10-CM | POA: Diagnosis not present

## 2020-12-06 DIAGNOSIS — I69391 Dysphagia following cerebral infarction: Secondary | ICD-10-CM | POA: Diagnosis not present

## 2020-12-06 DIAGNOSIS — H401111 Primary open-angle glaucoma, right eye, mild stage: Secondary | ICD-10-CM | POA: Diagnosis not present

## 2020-12-07 DIAGNOSIS — I6521 Occlusion and stenosis of right carotid artery: Secondary | ICD-10-CM | POA: Diagnosis not present

## 2020-12-07 DIAGNOSIS — I69322 Dysarthria following cerebral infarction: Secondary | ICD-10-CM | POA: Diagnosis not present

## 2020-12-07 DIAGNOSIS — I69392 Facial weakness following cerebral infarction: Secondary | ICD-10-CM | POA: Diagnosis not present

## 2020-12-07 DIAGNOSIS — I69320 Aphasia following cerebral infarction: Secondary | ICD-10-CM | POA: Diagnosis not present

## 2020-12-07 DIAGNOSIS — I69351 Hemiplegia and hemiparesis following cerebral infarction affecting right dominant side: Secondary | ICD-10-CM | POA: Diagnosis not present

## 2020-12-07 DIAGNOSIS — I69391 Dysphagia following cerebral infarction: Secondary | ICD-10-CM | POA: Diagnosis not present

## 2020-12-07 DIAGNOSIS — E119 Type 2 diabetes mellitus without complications: Secondary | ICD-10-CM | POA: Diagnosis not present

## 2020-12-07 DIAGNOSIS — I69390 Apraxia following cerebral infarction: Secondary | ICD-10-CM | POA: Diagnosis not present

## 2020-12-07 DIAGNOSIS — H9325 Central auditory processing disorder: Secondary | ICD-10-CM | POA: Diagnosis not present

## 2020-12-07 DIAGNOSIS — I69398 Other sequelae of cerebral infarction: Secondary | ICD-10-CM | POA: Diagnosis not present

## 2020-12-11 DIAGNOSIS — I69398 Other sequelae of cerebral infarction: Secondary | ICD-10-CM | POA: Diagnosis not present

## 2020-12-11 DIAGNOSIS — E119 Type 2 diabetes mellitus without complications: Secondary | ICD-10-CM | POA: Diagnosis not present

## 2020-12-11 DIAGNOSIS — I69392 Facial weakness following cerebral infarction: Secondary | ICD-10-CM | POA: Diagnosis not present

## 2020-12-11 DIAGNOSIS — I6521 Occlusion and stenosis of right carotid artery: Secondary | ICD-10-CM | POA: Diagnosis not present

## 2020-12-11 DIAGNOSIS — I69351 Hemiplegia and hemiparesis following cerebral infarction affecting right dominant side: Secondary | ICD-10-CM | POA: Diagnosis not present

## 2020-12-11 DIAGNOSIS — I69391 Dysphagia following cerebral infarction: Secondary | ICD-10-CM | POA: Diagnosis not present

## 2020-12-11 DIAGNOSIS — I69390 Apraxia following cerebral infarction: Secondary | ICD-10-CM | POA: Diagnosis not present

## 2020-12-11 DIAGNOSIS — H9325 Central auditory processing disorder: Secondary | ICD-10-CM | POA: Diagnosis not present

## 2020-12-11 DIAGNOSIS — I69322 Dysarthria following cerebral infarction: Secondary | ICD-10-CM | POA: Diagnosis not present

## 2020-12-11 DIAGNOSIS — I69320 Aphasia following cerebral infarction: Secondary | ICD-10-CM | POA: Diagnosis not present

## 2020-12-13 DIAGNOSIS — I69320 Aphasia following cerebral infarction: Secondary | ICD-10-CM | POA: Diagnosis not present

## 2020-12-13 DIAGNOSIS — I69390 Apraxia following cerebral infarction: Secondary | ICD-10-CM | POA: Diagnosis not present

## 2020-12-13 DIAGNOSIS — E119 Type 2 diabetes mellitus without complications: Secondary | ICD-10-CM | POA: Diagnosis not present

## 2020-12-13 DIAGNOSIS — I6521 Occlusion and stenosis of right carotid artery: Secondary | ICD-10-CM | POA: Diagnosis not present

## 2020-12-13 DIAGNOSIS — I69322 Dysarthria following cerebral infarction: Secondary | ICD-10-CM | POA: Diagnosis not present

## 2020-12-13 DIAGNOSIS — I69351 Hemiplegia and hemiparesis following cerebral infarction affecting right dominant side: Secondary | ICD-10-CM | POA: Diagnosis not present

## 2020-12-13 DIAGNOSIS — I69391 Dysphagia following cerebral infarction: Secondary | ICD-10-CM | POA: Diagnosis not present

## 2020-12-13 DIAGNOSIS — H9325 Central auditory processing disorder: Secondary | ICD-10-CM | POA: Diagnosis not present

## 2020-12-13 DIAGNOSIS — I69392 Facial weakness following cerebral infarction: Secondary | ICD-10-CM | POA: Diagnosis not present

## 2020-12-13 DIAGNOSIS — I69398 Other sequelae of cerebral infarction: Secondary | ICD-10-CM | POA: Diagnosis not present

## 2020-12-18 DIAGNOSIS — I6521 Occlusion and stenosis of right carotid artery: Secondary | ICD-10-CM | POA: Diagnosis not present

## 2020-12-18 DIAGNOSIS — I69322 Dysarthria following cerebral infarction: Secondary | ICD-10-CM | POA: Diagnosis not present

## 2020-12-18 DIAGNOSIS — H9325 Central auditory processing disorder: Secondary | ICD-10-CM | POA: Diagnosis not present

## 2020-12-18 DIAGNOSIS — I69351 Hemiplegia and hemiparesis following cerebral infarction affecting right dominant side: Secondary | ICD-10-CM | POA: Diagnosis not present

## 2020-12-18 DIAGNOSIS — I69320 Aphasia following cerebral infarction: Secondary | ICD-10-CM | POA: Diagnosis not present

## 2020-12-18 DIAGNOSIS — I69390 Apraxia following cerebral infarction: Secondary | ICD-10-CM | POA: Diagnosis not present

## 2020-12-18 DIAGNOSIS — I69398 Other sequelae of cerebral infarction: Secondary | ICD-10-CM | POA: Diagnosis not present

## 2020-12-18 DIAGNOSIS — I69392 Facial weakness following cerebral infarction: Secondary | ICD-10-CM | POA: Diagnosis not present

## 2020-12-18 DIAGNOSIS — I69391 Dysphagia following cerebral infarction: Secondary | ICD-10-CM | POA: Diagnosis not present

## 2020-12-18 DIAGNOSIS — E119 Type 2 diabetes mellitus without complications: Secondary | ICD-10-CM | POA: Diagnosis not present

## 2020-12-20 DIAGNOSIS — I69351 Hemiplegia and hemiparesis following cerebral infarction affecting right dominant side: Secondary | ICD-10-CM | POA: Diagnosis not present

## 2020-12-20 DIAGNOSIS — I6521 Occlusion and stenosis of right carotid artery: Secondary | ICD-10-CM | POA: Diagnosis not present

## 2020-12-20 DIAGNOSIS — I69390 Apraxia following cerebral infarction: Secondary | ICD-10-CM | POA: Diagnosis not present

## 2020-12-20 DIAGNOSIS — I69392 Facial weakness following cerebral infarction: Secondary | ICD-10-CM | POA: Diagnosis not present

## 2020-12-20 DIAGNOSIS — H9325 Central auditory processing disorder: Secondary | ICD-10-CM | POA: Diagnosis not present

## 2020-12-20 DIAGNOSIS — I69391 Dysphagia following cerebral infarction: Secondary | ICD-10-CM | POA: Diagnosis not present

## 2020-12-20 DIAGNOSIS — E119 Type 2 diabetes mellitus without complications: Secondary | ICD-10-CM | POA: Diagnosis not present

## 2020-12-20 DIAGNOSIS — I69322 Dysarthria following cerebral infarction: Secondary | ICD-10-CM | POA: Diagnosis not present

## 2020-12-20 DIAGNOSIS — I69320 Aphasia following cerebral infarction: Secondary | ICD-10-CM | POA: Diagnosis not present

## 2020-12-20 DIAGNOSIS — I69398 Other sequelae of cerebral infarction: Secondary | ICD-10-CM | POA: Diagnosis not present

## 2020-12-21 DIAGNOSIS — I69351 Hemiplegia and hemiparesis following cerebral infarction affecting right dominant side: Secondary | ICD-10-CM | POA: Diagnosis not present

## 2020-12-21 DIAGNOSIS — H9325 Central auditory processing disorder: Secondary | ICD-10-CM | POA: Diagnosis not present

## 2020-12-21 DIAGNOSIS — E119 Type 2 diabetes mellitus without complications: Secondary | ICD-10-CM | POA: Diagnosis not present

## 2020-12-21 DIAGNOSIS — I69390 Apraxia following cerebral infarction: Secondary | ICD-10-CM | POA: Diagnosis not present

## 2020-12-21 DIAGNOSIS — I69398 Other sequelae of cerebral infarction: Secondary | ICD-10-CM | POA: Diagnosis not present

## 2020-12-21 DIAGNOSIS — I69391 Dysphagia following cerebral infarction: Secondary | ICD-10-CM | POA: Diagnosis not present

## 2020-12-21 DIAGNOSIS — I69322 Dysarthria following cerebral infarction: Secondary | ICD-10-CM | POA: Diagnosis not present

## 2020-12-21 DIAGNOSIS — I6521 Occlusion and stenosis of right carotid artery: Secondary | ICD-10-CM | POA: Diagnosis not present

## 2020-12-21 DIAGNOSIS — I69320 Aphasia following cerebral infarction: Secondary | ICD-10-CM | POA: Diagnosis not present

## 2020-12-21 DIAGNOSIS — I69392 Facial weakness following cerebral infarction: Secondary | ICD-10-CM | POA: Diagnosis not present

## 2020-12-25 DIAGNOSIS — I69398 Other sequelae of cerebral infarction: Secondary | ICD-10-CM | POA: Diagnosis not present

## 2020-12-25 DIAGNOSIS — I6521 Occlusion and stenosis of right carotid artery: Secondary | ICD-10-CM | POA: Diagnosis not present

## 2020-12-25 DIAGNOSIS — I69390 Apraxia following cerebral infarction: Secondary | ICD-10-CM | POA: Diagnosis not present

## 2020-12-25 DIAGNOSIS — I69391 Dysphagia following cerebral infarction: Secondary | ICD-10-CM | POA: Diagnosis not present

## 2020-12-25 DIAGNOSIS — E119 Type 2 diabetes mellitus without complications: Secondary | ICD-10-CM | POA: Diagnosis not present

## 2020-12-25 DIAGNOSIS — I69320 Aphasia following cerebral infarction: Secondary | ICD-10-CM | POA: Diagnosis not present

## 2020-12-25 DIAGNOSIS — I69322 Dysarthria following cerebral infarction: Secondary | ICD-10-CM | POA: Diagnosis not present

## 2020-12-25 DIAGNOSIS — I69351 Hemiplegia and hemiparesis following cerebral infarction affecting right dominant side: Secondary | ICD-10-CM | POA: Diagnosis not present

## 2020-12-25 DIAGNOSIS — H9325 Central auditory processing disorder: Secondary | ICD-10-CM | POA: Diagnosis not present

## 2020-12-25 DIAGNOSIS — I69392 Facial weakness following cerebral infarction: Secondary | ICD-10-CM | POA: Diagnosis not present

## 2021-01-01 DIAGNOSIS — E119 Type 2 diabetes mellitus without complications: Secondary | ICD-10-CM | POA: Diagnosis not present

## 2021-01-01 DIAGNOSIS — I6521 Occlusion and stenosis of right carotid artery: Secondary | ICD-10-CM | POA: Diagnosis not present

## 2021-01-01 DIAGNOSIS — I69398 Other sequelae of cerebral infarction: Secondary | ICD-10-CM | POA: Diagnosis not present

## 2021-01-01 DIAGNOSIS — H9325 Central auditory processing disorder: Secondary | ICD-10-CM | POA: Diagnosis not present

## 2021-01-01 DIAGNOSIS — I69390 Apraxia following cerebral infarction: Secondary | ICD-10-CM | POA: Diagnosis not present

## 2021-01-01 DIAGNOSIS — I69351 Hemiplegia and hemiparesis following cerebral infarction affecting right dominant side: Secondary | ICD-10-CM | POA: Diagnosis not present

## 2021-01-01 DIAGNOSIS — I69392 Facial weakness following cerebral infarction: Secondary | ICD-10-CM | POA: Diagnosis not present

## 2021-01-01 DIAGNOSIS — I69391 Dysphagia following cerebral infarction: Secondary | ICD-10-CM | POA: Diagnosis not present

## 2021-01-01 DIAGNOSIS — I69322 Dysarthria following cerebral infarction: Secondary | ICD-10-CM | POA: Diagnosis not present

## 2021-01-01 DIAGNOSIS — I69320 Aphasia following cerebral infarction: Secondary | ICD-10-CM | POA: Diagnosis not present

## 2021-01-09 ENCOUNTER — Telehealth (HOSPITAL_COMMUNITY): Payer: Self-pay

## 2021-01-09 NOTE — Telephone Encounter (Signed)
Returned pt's call. Will call pt to schedule cta when auth is received. Pt agreed with this plan. AW

## 2021-01-14 DIAGNOSIS — I69392 Facial weakness following cerebral infarction: Secondary | ICD-10-CM | POA: Diagnosis not present

## 2021-01-14 DIAGNOSIS — H9325 Central auditory processing disorder: Secondary | ICD-10-CM | POA: Diagnosis not present

## 2021-01-14 DIAGNOSIS — I69322 Dysarthria following cerebral infarction: Secondary | ICD-10-CM | POA: Diagnosis not present

## 2021-01-14 DIAGNOSIS — I69391 Dysphagia following cerebral infarction: Secondary | ICD-10-CM | POA: Diagnosis not present

## 2021-01-14 DIAGNOSIS — I6521 Occlusion and stenosis of right carotid artery: Secondary | ICD-10-CM | POA: Diagnosis not present

## 2021-01-14 DIAGNOSIS — I69398 Other sequelae of cerebral infarction: Secondary | ICD-10-CM | POA: Diagnosis not present

## 2021-01-14 DIAGNOSIS — E119 Type 2 diabetes mellitus without complications: Secondary | ICD-10-CM | POA: Diagnosis not present

## 2021-01-14 DIAGNOSIS — I69390 Apraxia following cerebral infarction: Secondary | ICD-10-CM | POA: Diagnosis not present

## 2021-01-14 DIAGNOSIS — I69351 Hemiplegia and hemiparesis following cerebral infarction affecting right dominant side: Secondary | ICD-10-CM | POA: Diagnosis not present

## 2021-01-14 DIAGNOSIS — I69320 Aphasia following cerebral infarction: Secondary | ICD-10-CM | POA: Diagnosis not present

## 2021-01-16 ENCOUNTER — Other Ambulatory Visit: Payer: Self-pay

## 2021-01-16 ENCOUNTER — Ambulatory Visit: Payer: Medicare HMO

## 2021-01-16 ENCOUNTER — Encounter: Payer: Self-pay | Admitting: Occupational Therapy

## 2021-01-16 ENCOUNTER — Ambulatory Visit: Payer: Medicare HMO | Admitting: Occupational Therapy

## 2021-01-16 DIAGNOSIS — R482 Apraxia: Secondary | ICD-10-CM | POA: Insufficient documentation

## 2021-01-16 DIAGNOSIS — M79641 Pain in right hand: Secondary | ICD-10-CM

## 2021-01-16 DIAGNOSIS — R208 Other disturbances of skin sensation: Secondary | ICD-10-CM | POA: Insufficient documentation

## 2021-01-16 DIAGNOSIS — I69351 Hemiplegia and hemiparesis following cerebral infarction affecting right dominant side: Secondary | ICD-10-CM | POA: Diagnosis not present

## 2021-01-16 DIAGNOSIS — R2689 Other abnormalities of gait and mobility: Secondary | ICD-10-CM | POA: Diagnosis not present

## 2021-01-16 DIAGNOSIS — R471 Dysarthria and anarthria: Secondary | ICD-10-CM | POA: Diagnosis not present

## 2021-01-16 DIAGNOSIS — R4701 Aphasia: Secondary | ICD-10-CM | POA: Insufficient documentation

## 2021-01-16 DIAGNOSIS — I63512 Cerebral infarction due to unspecified occlusion or stenosis of left middle cerebral artery: Secondary | ICD-10-CM | POA: Diagnosis not present

## 2021-01-16 DIAGNOSIS — R2681 Unsteadiness on feet: Secondary | ICD-10-CM

## 2021-01-16 NOTE — Therapy (Signed)
[facility] [address] [place], [place], [address] Phone: [phone]   Fax:  [phone]  Occupational Therapy Evaluation  Patient Details  Name: Sharon Gilbert MRN: [id number] Date of Birth: Aug 15, 1941 Referring Provider (OT): Linus Galas, NP   Encounter Date: 01/16/2021   OT End of Session - 01/16/21 1450    Visit Number 1    Number of Visits 9    Date for OT Re-Evaluation 04/01/21    Authorization Type [employer]    Progress Note Due on Visit 10    OT Start Time 1315    OT Stop Time 1400    OT Time Calculation (min) 45 min    Activity Tolerance Patient tolerated treatment well    Behavior During Therapy [facility] for tasks assessed/performed           Past Medical History:  Diagnosis Date  . Cataract   . Diabetes mellitus without complication [facility])     Past Surgical History:  Procedure Laterality Date  . IR CT HEAD LTD  07/04/2020  . IR INTRAVSC STENT CERV CAROTID W/O EMB-PROT MOD SED INC ANGIO  07/04/2020  . IR PERCUTANEOUS ART THROMBECTOMY/INFUSION INTRACRANIAL INC DIAG ANGIO  07/04/2020  . IR RADIOLOGIST EVAL & MGMT  10/01/2020  . RADIOLOGY WITH ANESTHESIA N/A 07/04/2020   Procedure: IR WITH ANESTHESIA;  Surgeon: Radiologist, Medication, MD;  Location: [facility] OR;  Service: Radiology;  Laterality: N/A;    There were no vitals filed for this visit.   Subjective Assessment - 01/16/21 1319    Subjective  I want to be able to drive so bad.  Patient gestures to right hand - wants to improve hand use    Currently in Pain? No/denies   reports having hand pain sometimes            [facility] OT Assessment - 01/16/21 1321      Assessment   Medical Diagnosis CVA    Referring Provider (OT) Linus Galas, NP    Onset Date/Surgical Date 06/26/20    Hand Dominance Right    Prior Therapy [facility] services      Precautions   Precautions Fall      Prior Function   Level of Independence Independent with basic ADLs       ADL   Eating/Feeding Modified independent    Grooming Modified independent    Upper Body Bathing Modified independent    Lower Body Bathing Modified independent    ADL comments Patient aphasic and no family present - she indicates that she completes her own self care      IADL   Prior Level of Function Light Housekeeping Independent    Light Housekeeping Performs light daily tasks such as dishwashing, bed making    Prior Level of Function Meal Prep Independent    Meal Prep --   reports cooking greens and beans last week   Community Mobility Relies on family or friends for transportation      Written Expression   Dominant Hand Right    Handwriting 25% legible      Vision - History   Baseline Vision --   not wearing glasses - indicates she has glasses   Visual History Cataracts      Vision Assessment   Eye Alignment Within Functional Limits    Ocular Range of Motion Within Functional Limits    Alignment/Gaze Preference Within Defined Limits      Activity Tolerance   Activity Tolerance Endurance does  not limit participation in activity      Cognition   Overall Cognitive Status --   Not obvious cognitive deficits - maintains eye contact, responds on cue - aphasic     Observation/Other Assessments   Focus on Therapeutic Outcomes (FOTO)  NA      Posture/Postural Control   Posture/Postural Control No significant limitations      Sensation   Light Touch Appears Intact    Stereognosis Appears Intact    Proprioception Impaired by gross assessment      Coordination   Gross Motor Movements are Fluid and Coordinated No    Fine Motor Movements are Fluid and Coordinated No    Finger Nose Finger Test grossly overshooting    9 Hole Peg Test Right;Left    Right 9 Hole Peg Test unable    Left 9 Hole Peg Test 33.53    Box and Blocks 22      Praxis   Praxis Impaired    Praxis Impairment Details Motor planning      ROM / Strength   AROM / PROM / Strength AROM;Strength      AROM    Overall AROM  Within functional limits for tasks performed   Shoulder flexion to ~130   Overall AROM Comments Limitations in motor control in digits 2-5      Strength   Overall Strength Deficits    Overall Strength Comments RUE Strength 4-/5      Hand Function   Right Hand Gross Grasp Impaired    Right Hand Grip (lbs) 15    Right Hand Lateral Pinch 8 lbs    Left Hand Gross Grasp Functional    Left Hand Grip (lbs) 31    Left Hand Lateral Pinch 11 lbs                           OT Education - 01/16/21 1447    Education Details Role of OT and SLP    Person(s) Educated Patient    Comprehension Need further instruction            OT Short Term Goals - 01/16/21 1501      OT SHORT TERM GOAL #1   Title Patient will complete a home exercise program designed to improve RUE grip strength    Time 4    Period Weeks    Status New    Target Date 03/02/21      OT SHORT TERM GOAL #2   Title Patient will demonstrate effective adaptive grip on pen to write her name.    Time 4    Period Weeks    Status New             OT Long Term Goals - 01/16/21 1504      OT LONG TERM GOAL #1   Title Patient will complete home activities program to improve functional use of RUE    Time 8    Period Weeks    Status New    Target Date 04/01/21      OT LONG TERM GOAL #2   Title Patient will demonstrate improved score on box and blocks test by 5 blocks    Baseline 22    Time 8    Period Weeks    Status New      OT LONG TERM GOAL #3   Title Patient will complete an updated HEP designed to improve coordination and general upper body strength  Time 8    Period Weeks    Status New      OT LONG TERM GOAL #4   Title Patient will demonstrate at least 3 lb increase in right grip strength to improve functional grasp    Time 8    Period Weeks    Status New      OT LONG TERM GOAL #5   Title Patient will report no increase in pain after 5 min of right hand exercise or  functional use.    Time 8    Period Weeks    Status New                 Plan - 01/16/21 1451    Clinical Impression Statement Patient is an 80 yr old woman with multiple Left frontal and parietal infarcts from 06/2020. Patient had revascularization / stents and developed SAH.  Patient presents to OP OT with significant aphasia (no family member present,)  decreased sensation / strength in RUE, and apraxia impacting her right dominant hand.  Patient may benefit from skilled OT intervention to improve functional strength and use of dominant UE.    OT Occupational Profile and History Detailed Assessment- Review of Records and additional review of physical, cognitive, psychosocial history related to current functional performance    Occupational performance deficits (Please refer to evaluation for details): ADL's;IADL's    Body Structure / Function / Physical Skills ADL;Coordination;GMC;UE functional use;Sensation;Decreased knowledge of use of DME;Flexibility;IADL;Pain;Strength;Proprioception;FMC;Dexterity;Tone;ROM    Rehab Potential Fair    Clinical Decision Making Several treatment options, min-mod task modification necessary    Comorbidities Affecting Occupational Performance: May have comorbidities impacting occupational performance    Modification or Assistance to Complete Evaluation  Min-Moderate modification of tasks or assist with assess necessary to complete eval    OT Frequency 1x / week    OT Duration 8 weeks    OT Treatment/Interventions Self-care/ADL training;Electrical Stimulation;Therapeutic exercise;Patient/family education;Splinting;Neuromuscular education;Moist Heat;Functional Development worker, communityMobility Training;Therapeutic activities;Manual Therapy;DME and/or AE instruction;Contrast Bath;Ultrasound;Cryotherapy;Fluidtherapy    Plan Need to further assess coordiantion - hand shaping, question functional splint - needs to attend to hand movement    Consulted and Agree with Plan of Care Patient            Patient will benefit from skilled therapeutic intervention in order to improve the following deficits and impairments:   Body Structure / Function / Physical Skills: ADL,Coordination,GMC,UE functional use,Sensation,Decreased knowledge of use of DME,Flexibility,IADL,Pain,Strength,Proprioception,FMC,Dexterity,Tone,ROM       Visit Diagnosis: Apraxia - Plan: Ot plan of care cert/re-cert  Other disturbances of skin sensation - Plan: Ot plan of care cert/re-cert  Hemiplegia and hemiparesis following cerebral infarction affecting right dominant side (HCC) - Plan: Ot plan of care cert/re-cert  Pain in right hand - Plan: Ot plan of care cert/re-cert    Problem List Patient Active Problem List   Diagnosis Date Noted  . Dyslipidemia   . Right hemiparesis (HCC)   . Slow transit constipation   . History of hypertension   . Dysphagia, post-stroke   . Acute blood loss anemia   . Controlled type 2 diabetes mellitus with complication, without long-term current use of insulin (HCC)   . Stenosis of right internal carotid artery   . Carotid stenosis, left, symptomatic s/p L ICA stent 07/11/2020  . Carotid stenosis, asymptomatic, right 07/11/2020  . Essential hypertension 07/11/2020  . Hyperlipidemia LDL goal <70 07/11/2020  . Dysphagia due to recent stroke 07/11/2020  . Prediabetes 07/11/2020  . Middle cerebral artery  embolism, left 07/04/2020  . Acute ischemic left ICA stroke (HCC) d/t LAA R ICA 07/03/2020  . Numbness 06/27/2020    Collier Salina, OTR/L 01/16/2021, 3:10 PM  [facility] [address] [place], [place], [address] Phone: [phone]   Fax:  [phone]  Name: Sharon Gilbert MRN: [id number] Date of Birth: [birth year]

## 2021-01-16 NOTE — Therapy (Signed)
[facility] [address] [place], [place], [address] Phone: [phone]   Fax:  [phone]  Speech Language Pathology Evaluation  Patient Details  Name: SHILO PHILIPSON MRN: [id number] Date of Birth: 09-Apr-1941 Referring Provider (SLP): Linus Galas, NP   Encounter Date: 01/16/2021   End of Session - 01/16/21 1501    Visit Number 1    Number of Visits 17    Date for SLP Re-Evaluation 04/16/21    Authorization Type [employer] Medicare    SLP Start Time 1358    SLP Stop Time  1440    SLP Time Calculation (min) 42 min    Activity Tolerance Patient tolerated treatment well           Past Medical History:  Diagnosis Date  . Cataract   . Diabetes mellitus without complication [facility])     Past Surgical History:  Procedure Laterality Date  . IR CT HEAD LTD  07/04/2020  . IR INTRAVSC STENT CERV CAROTID W/O EMB-PROT MOD SED INC ANGIO  07/04/2020  . IR PERCUTANEOUS ART THROMBECTOMY/INFUSION INTRACRANIAL INC DIAG ANGIO  07/04/2020  . IR RADIOLOGIST EVAL & MGMT  10/01/2020  . RADIOLOGY WITH ANESTHESIA N/A 07/04/2020   Procedure: IR WITH ANESTHESIA;  Surgeon: Radiologist, Medication, MD;  Location: [facility] OR;  Service: Radiology;  Laterality: N/A;    There were no vitals filed for this visit.       SLP Evaluation [facility] - 01/16/21 1330      SLP Visit Information   SLP Received On 01/03/21    Referring Provider (SLP) Linus Galas, NP    Onset Date September 2021    Medical Diagnosis aphasia and apraxia following cerebral infarction      Subjective   Patient/Family Stated Goal "we would together..." unintelligible      Pain Assessment   Currently in Pain? No/denies      General Information   HPI Patient is an 80 y.o. female with PMH: prediabetes, cataracts who was admitted on 9/28 as code stroke. Patient lives with son and he noticed slurred speech and mild facial droop. Stroke workup revealed L ICA occlusion, near occlusive  stenosis of proximal R ICA. 9.28 MRI brain revealed multiple small acute infarcts within left frontal and parietal lobes within left MCA vascular territory. CT on 9/30 revealed progressive subarachnoid hemorrhage reaching the basal cisterns, no hydrocephalus. Patient intubated on 9/29 for L ICA revascularization with NIR and extubated on 9/30 to O2 via nasal cannula.      Balance Screen   Has the patient fallen in the past 6 months No    Has the patient had a decrease in activity level because of a fear of falling?  No    Is the patient reluctant to leave their home because of a fear of falling?  No      Prior Functional Status   Cognitive/Linguistic Baseline Within functional limits    Type of Home House     Lives With Son    Available Support Family    Education HS, 2 years of college    Vocation Retired   Product/process development scientist   Overall Cognitive Status Difficult to assess    Difficult to assess due to Impaired communication      Auditory Comprehension   Overall Auditory Comprehension Impaired    Yes/No Questions Impaired    Commands Within Functional Limits    Interfering Components Processing speed;Motor planning    EffectiveTechniques Extra  processing time;Pausing;Slowed speech      Verbal Expression   Overall Verbal Expression Impaired    Initiation Impaired    Level of Generative/Spontaneous Verbalization Conversation    Repetition Impaired    Level of Impairment Word level;Phrase level;Sentence level    Naming Impairment    Responsive 76-100% accurate    Confrontation 75-100% accurate    Convergent Not tested    Divergent Not tested    Verbal Errors Inconsistent;Not aware of errors;Perseveration;Neologisms      Oral Motor/Sensory Function   Overall Oral Motor/Sensory Function Impaired    Labial ROM Reduced right;Reduced left    Labial Symmetry Within Functional Limits    Labial Coordination Reduced    Lingual ROM Reduced left    Lingual Symmetry Within  Functional Limits    Lingual Strength Reduced    Lingual Coordination Reduced    Facial Symmetry Within Functional Limits      Motor Speech   Overall Motor Speech Impaired    Respiration Within functional limits    Phonation Normal    Resonance Within functional limits    Articulation Impaired    Level of Impairment Word    Intelligibility Intelligibility reduced    Word 50-74% accurate    Phrase 50-74% accurate    Sentence 25-49% accurate    Conversation 25-49% accurate    Motor Planning Impaired    Level of Impairment Word    Motor Speech Errors Unaware;Inconsistent;Groping for words    Effective Techniques Slow rate;Over-articulate;Pacing      Standardized Assessments   Standardized Assessments  Other Assessment   Quick Aphasia Battery=4.95 (aphasia)                            SLP Short Term Goals - 01/16/21 1502      SLP SHORT TERM GOAL #1   Title Pt will complete HEP with min A over 3 sessions    Time 4    Period Weeks    Status New      SLP SHORT TERM GOAL #2   Title Pt will use mulitmodal communication (gesture, draw, write 1st letter etc) to augment verbal expression with usual min A over 2 sessions    Time 4    Period Weeks    Status New      SLP SHORT TERM GOAL #3   Title Pt will verbally ID and correct speech errors in paragraph level reading or picture description tasks with min A over 2 sessions    Time 4    Period Weeks    Status New      SLP SHORT TERM GOAL #4   Title Pt will demonstrate speech compensations in simple 5 minute conversation with usual min A over 2 sessions    Time 4    Period Weeks    Status New      SLP SHORT TERM GOAL #5   Title Pt will identify current communication effectiveness via QOL scale in first 2 sessions    Time 2    Period Weeks    Status New            SLP Long Term Goals - 01/16/21 1510      SLP LONG TERM GOAL #1   Title Pt will use mulitmodal communication (gesture, draw, write 1st  letter etc) to augment verbal expression to meet needs at home with rare min A from family.    Time 8  Period Weeks    Status New      SLP LONG TERM GOAL #2   Title Pt will demonstrate speech compensations in 10 simple to mod complex conversation with min A over 2 sessions    Time 8    Period Weeks    Status New      SLP LONG TERM GOAL #3   Title Pt will report improvements in communication effectiveness via QOL scale by last ST session    Time 8    Period Weeks    Status New            Plan - 01/16/21 1512    Clinical Impression Statement Damiah presents to OPST evaluation for aphasia and apraxia following cerebral infarction in September 2021. Pt reports after extended hopsital stay ("~2 weeks.Marland KitchenMarland KitchenI mean months"), pt received HH ST services to address apraxia and aphasia. Pt provided HH speech materials, which included specific speech sounds and functional phrases. In opening conversation, pt exhibited usual aphasic and apraxic errors with occasional self-corrections noted (ex: pt able to correct month with additional time). Frequent unintelligible conversational level speech exhibited secondary to reduced oral motor coordination and fast rate of speech. Quick Aphasia Battery completed this session, which indicated severe oral motor apraxia, moderate aphasia, and mild receptive language deficits. Relative strengths include pt ability to read and spell words to aid listener comprehension. SLP targeted stimulability of slow speech for reading at sentence level, which improved intelligibility to 90%. Carmelia indicated her son has trouble understanding her and often feels frustrated by her speech. Skilled ST intervention is warranted to address significant apraxia and aphasia impacting pt's ability to effectively communicate with familiar and unfamiliar listeners and to increase QOL.    Speech Therapy Frequency 2x / week    Duration 8 weeks   or 17 total visits   Treatment/Interventions  Compensatory strategies;Functional tasks;Patient/family education;Cueing hierarchy;Environmental controls;Multimodal communcation approach;Compensatory techniques;Internal/external aids;SLP instruction and feedback;Language facilitation    Potential to Achieve Goals Fair    Potential Considerations Severity of impairments    SLP Home Exercise Plan provided    Consulted and Agree with Plan of Care Patient           Patient will benefit from skilled therapeutic intervention in order to improve the following deficits and impairments:   Verbal apraxia  Aphasia    Problem List Patient Active Problem List   Diagnosis Date Noted  . Dyslipidemia   . Right hemiparesis (HCC)   . Slow transit constipation   . History of hypertension   . Dysphagia, post-stroke   . Acute blood loss anemia   . Controlled type 2 diabetes mellitus with complication, without long-term current use of insulin (HCC)   . Stenosis of right internal carotid artery   . Carotid stenosis, left, symptomatic s/p L ICA stent 07/11/2020  . Carotid stenosis, asymptomatic, right 07/11/2020  . Essential hypertension 07/11/2020  . Hyperlipidemia LDL goal <70 07/11/2020  . Dysphagia due to recent stroke 07/11/2020  . Prediabetes 07/11/2020  . Middle cerebral artery embolism, left 07/04/2020  . Acute ischemic left ICA stroke (HCC) d/t LAA R ICA 07/03/2020  . Numbness 06/27/2020    Janann Colonel, MA CCC-SLP 01/16/2021, 6:00 PM  [facility] [address] [place], [place], [address] Phone: [phone]   Fax:  [phone]  Name: SYNCERE KAMINSKI MRN: [id number] Date of Birth: [birth year]

## 2021-01-16 NOTE — Therapy (Signed)
[facility] [address] [place], [place], [address] Phone: [phone]   Fax:  [phone]  Physical Therapy Evaluation/Eval Only  Patient Details  Name: Sharon Gilbert MRN: [id number] Date of Birth: [birth year] Referring Provider (PT): Particia Nearing NP   Encounter Date: 01/16/2021   PT End of Session - 01/16/21 1306    Visit Number 1    Number of Visits 1    Authorization Type [employer]    PT Start Time 1230    PT Stop Time 1315    PT Time Calculation (min) 45 min    Activity Tolerance Patient tolerated treatment well    Behavior During Therapy [facility] for tasks assessed/performed           Past Medical History:  Diagnosis Date  . Cataract   . Diabetes mellitus without complication [facility])     Past Surgical History:  Procedure Laterality Date  . IR CT HEAD LTD  07/04/2020  . IR INTRAVSC STENT CERV CAROTID W/O EMB-PROT MOD SED INC ANGIO  07/04/2020  . IR PERCUTANEOUS ART THROMBECTOMY/INFUSION INTRACRANIAL INC DIAG ANGIO  07/04/2020  . IR RADIOLOGIST EVAL & MGMT  10/01/2020  . RADIOLOGY WITH ANESTHESIA N/A 07/04/2020   Procedure: IR WITH ANESTHESIA;  Surgeon: Radiologist, Medication, MD;  Location: [facility] OR;  Service: Radiology;  Laterality: N/A;    There were no vitals filed for this visit.    Subjective Assessment - 01/16/21 1238    Subjective patient aphasic and unable to correctly relay inforrmation, identifies speech and R hand deficits are her main concern    Patient is accompained by: Family member    Pertinent History This 80 y.o. female admitted with slurred speech and mild facial droop. She was found to have L ICA/M2 occlusion with subsequent worsening of symptoms due to M2 occlusion.  She underwent Lt M2 revasularization with telescopic stents and M2 mechanical thrombectomy on 07/04/2020.  Post procudrue she developed Lt SAH.   MRI showed multiple Lt frontal and parietal lobe infarcts.  Repeat CT of head on 9/30  showed increased SAH which extends into the basal cisterns, as well as cytotoxic edema Lt insula and Lt frontal lobes.  PMH includes:  HTN, cataracts.    How long can you sit comfortably? unlimited    How long can you stand comfortably? unlimited    How long can you walk comfortably? unlimited    Currently in Pain? No/denies              [facility] PT Assessment - 01/16/21 0001      Assessment   Medical Diagnosis CVA    Referring Provider (PT) Particia Nearing NP    Onset Date/Surgical Date 06/26/20    Hand Dominance Right    Prior Therapy [facility] services      Precautions   Precautions Fall      Balance Screen   Has the patient fallen in the past 6 months No      Home Environment   Living Environment Private residence    Living Arrangements Parent    Available Help at Discharge Family    Type of Home House    Home Access Stairs to enter    Entrance Stairs-Number of Steps 2-3    Entrance Stairs-Rails Can reach both    Home Layout One level      Prior Function   Level of Independence Independent    Vocation Retired      IT consultant   Overall  Cognitive Status Impaired/Different from baseline      Sensation   Light Touch Appears Intact      ROM / Strength   AROM / PROM / Strength AROM;Strength      AROM   Overall AROM  Within functional limits for tasks performed      Strength   Overall Strength Within functional limits for tasks performed    Overall Strength Comments BLE strength WFL      Transfers   Transfers Sit to Stand    Five time sit to stand comments  12.15s      Berg Balance Test   Sit to Stand Able to stand without using hands and stabilize independently    Standing Unsupported Able to stand safely 2 minutes    Sitting with Back Unsupported but Feet Supported on Floor or Stool Able to sit safely and securely 2 minutes    Stand to Sit Sits safely with minimal use of hands    Transfers Able to transfer safely, minor use of hands    Standing Unsupported with Eyes  Closed Able to stand 10 seconds safely    Standing Unsupported with Feet Together Able to place feet together independently and stand 1 minute safely    From Standing, Reach Forward with Outstretched Arm Can reach confidently >25 cm (10")    From Standing Position, Pick up Object from Floor Able to pick up shoe safely and easily    From Standing Position, Turn to Look Behind Over each Shoulder Looks behind from both sides and weight shifts well    Turn 360 Degrees Able to turn 360 degrees safely in 4 seconds or less    Standing Unsupported, Alternately Place Feet on Step/Stool Able to stand independently and safely and complete 8 steps in 20 seconds    Standing Unsupported, One Foot in Front Able to plae foot ahead of the other independently and hold 30 seconds    Standing on One Leg Able to lift leg independently and hold 5-10 seconds    Total Score 54      Timed Up and Go Test   Normal TUG (seconds) 9.28    TUG Comments best of 2 trials      High Level Balance   High Level Balance Comments able to hold all 4 positions of MCTSIB for 30s                      Objective measurements completed on examination: See above findings.                            Plan - 01/16/21 1505    Clinical Impression Statement Patient presents with functional LE strength, ROM and balance, standardized testing reveals no functional deficits related to mobility or balance.  No skilled PT needs identified at this time    Personal Factors and Comorbidities Comorbidity 1    Comorbidities CVA    Stability/Clinical Decision Making Stable/Uncomplicated    Clinical Decision Making Low    Rehab Potential Good    PT Next Visit Plan Eval Only, no skilled need identified    PT Home Exercise Plan na    Consulted and Agree with Plan of Care Patient           Patient will benefit from skilled therapeutic intervention in order to improve the following deficits and impairments:   Decreased endurance  Visit Diagnosis: Unsteadiness on feet  Acute ischemic left MCA stroke (HCC)  Other abnormalities of gait and mobility     Problem List Patient Active Problem List   Diagnosis Date Noted  . Dyslipidemia   . Right hemiparesis (HCC)   . Slow transit constipation   . History of hypertension   . Dysphagia, post-stroke   . Acute blood loss anemia   . Controlled type 2 diabetes mellitus with complication, without long-term current use of insulin (HCC)   . Stenosis of right internal carotid artery   . Carotid stenosis, left, symptomatic s/p L ICA stent 07/11/2020  . Carotid stenosis, asymptomatic, right 07/11/2020  . Essential hypertension 07/11/2020  . Hyperlipidemia LDL goal <70 07/11/2020  . Dysphagia due to recent stroke 07/11/2020  . Prediabetes 07/11/2020  . Middle cerebral artery embolism, left 07/04/2020  . Acute ischemic left ICA stroke (HCC) d/t LAA R ICA 07/03/2020  . Numbness 06/27/2020    Hildred Laser PT 01/16/2021, 3:11 PM  [facility] [address] [place], [place], [address] Phone: [phone]   Fax:  [phone]  Name: Sharon Gilbert MRN: [id number] Date of Birth: 1941/06/09

## 2021-01-21 ENCOUNTER — Other Ambulatory Visit (HOSPITAL_COMMUNITY): Payer: Self-pay | Admitting: Interventional Radiology

## 2021-01-21 DIAGNOSIS — I639 Cerebral infarction, unspecified: Secondary | ICD-10-CM

## 2021-01-24 ENCOUNTER — Ambulatory Visit: Payer: Medicare HMO

## 2021-01-24 ENCOUNTER — Ambulatory Visit: Payer: Medicare HMO | Admitting: Occupational Therapy

## 2021-01-24 ENCOUNTER — Other Ambulatory Visit: Payer: Self-pay

## 2021-01-24 ENCOUNTER — Encounter: Payer: Self-pay | Admitting: Occupational Therapy

## 2021-01-24 DIAGNOSIS — R482 Apraxia: Secondary | ICD-10-CM

## 2021-01-24 DIAGNOSIS — R471 Dysarthria and anarthria: Secondary | ICD-10-CM | POA: Diagnosis not present

## 2021-01-24 DIAGNOSIS — R2681 Unsteadiness on feet: Secondary | ICD-10-CM | POA: Diagnosis not present

## 2021-01-24 DIAGNOSIS — R4701 Aphasia: Secondary | ICD-10-CM

## 2021-01-24 DIAGNOSIS — I69351 Hemiplegia and hemiparesis following cerebral infarction affecting right dominant side: Secondary | ICD-10-CM

## 2021-01-24 DIAGNOSIS — M79641 Pain in right hand: Secondary | ICD-10-CM

## 2021-01-24 DIAGNOSIS — R208 Other disturbances of skin sensation: Secondary | ICD-10-CM

## 2021-01-24 DIAGNOSIS — I63512 Cerebral infarction due to unspecified occlusion or stenosis of left middle cerebral artery: Secondary | ICD-10-CM | POA: Diagnosis not present

## 2021-01-24 DIAGNOSIS — R2689 Other abnormalities of gait and mobility: Secondary | ICD-10-CM | POA: Diagnosis not present

## 2021-01-24 NOTE — Therapy (Signed)
[facility] [address] [place], [place], [address] Phone: [phone]   Fax:  [phone]  Occupational Therapy Treatment  Patient Details  Name: Sharon Gilbert MRN: [id number] Date of Birth: Jan 12, 1941 Referring Provider (OT): Linus Galas, NP   Encounter Date: 01/24/2021   OT End of Session - 01/24/21 1843    Visit Number 2    Number of Visits 9    Date for OT Re-Evaluation 04/01/21    Authorization Type [employer] Medicare    Progress Note Due on Visit 10    OT Start Time 1617    OT Stop Time 1700    OT Time Calculation (min) 43 min    Activity Tolerance Patient tolerated treatment well    Behavior During Therapy [facility] for tasks assessed/performed           Past Medical History:  Diagnosis Date  . Cataract   . Diabetes mellitus without complication [facility])     Past Surgical History:  Procedure Laterality Date  . IR CT HEAD LTD  07/04/2020  . IR INTRAVSC STENT CERV CAROTID W/O EMB-PROT MOD SED INC ANGIO  07/04/2020  . IR PERCUTANEOUS ART THROMBECTOMY/INFUSION INTRACRANIAL INC DIAG ANGIO  07/04/2020  . IR RADIOLOGIST EVAL & MGMT  10/01/2020  . RADIOLOGY WITH ANESTHESIA N/A 07/04/2020   Procedure: IR WITH ANESTHESIA;  Surgeon: Radiologist, Medication, MD;  [facility]: [facility] OR;  Service: Radiology;  Laterality: N/A;    There were no vitals filed for this visit.   Subjective Assessment - 01/24/21 1622    Subjective  I wash dishes, clothes - I try to use it (right hand)    Currently in Pain? No/denies                        OT Treatments/Exercises (OP) - 01/24/21 0001      Neurological Re-education Exercises   Other Exercises 1 Neuromuscular reeducation to address coordination in right hand.  Patient with limited ability to grade pressure in grasp and pinch - having difficulty coordinationg index and long finger - attempted coban 3-5 to prevent disruption - seemed helpful initially.  ALso used buddy strap  for index and long to improve dexterity.  Patient able to turn over cards, pick up checkers, or coins with increased time and lots of repetition.                  OT Education - 01/24/21 1842    Education Details Cueing to reduce shoulder elevation with attempts to use right hand    Person(s) Educated Patient    Methods Explanation;Demonstration    Comprehension Need further instruction            OT Short Term Goals - 01/24/21 1846      OT SHORT TERM GOAL #1   Title Patient will complete a home exercise program designed to improve RUE grip strength    Time 4    Period Weeks    Status On-going    Target Date 03/02/21      OT SHORT TERM GOAL #2   Title Patient will demonstrate effective adaptive grip on pen to write her name.    Time 4    Period Weeks    Status On-going             OT Long Term Goals - 01/24/21 1846      OT LONG TERM GOAL #1   Title Patient will complete home  activities program to improve functional use of RUE    Time 8    Period Weeks    Status On-going      OT LONG TERM GOAL #2   Title Patient will demonstrate improved score on box and blocks test by 5 blocks    Baseline 22    Time 8    Period Weeks    Status On-going      OT LONG TERM GOAL #3   Title Patient will complete an updated HEP designed to improve coordination and general upper body strength    Time 8    Period Weeks    Status On-going      OT LONG TERM GOAL #4   Title Patient will demonstrate at least 3 lb increase in right grip strength to improve functional grasp    Time 8    Period Weeks    Status On-going      OT LONG TERM GOAL #5   Title Patient will report no increase in pain after 5 min of right hand exercise or functional use.    Time 8    Period Weeks    Status On-going                 Plan - 01/24/21 1844    Clinical Impression Statement Patient responds well to physical and simple gestural / verbal cueing.  Patient with significant apraxia and  aphasia - but responding well thus far to therapeutic process.    OT Occupational Profile and History Detailed Assessment- Review of Records and additional review of physical, cognitive, psychosocial history related to current functional performance    Occupational performance deficits (Please refer to evaluation for details): ADL's;IADL's    Body Structure / Function / Physical Skills ADL;Coordination;GMC;UE functional use;Sensation;Decreased knowledge of use of DME;Flexibility;IADL;Pain;Strength;Proprioception;FMC;Dexterity;Tone;ROM    Rehab Potential Fair    Clinical Decision Making Several treatment options, min-mod task modification necessary    Comorbidities Affecting Occupational Performance: May have comorbidities impacting occupational performance    Modification or Assistance to Complete Evaluation  Min-Moderate modification of tasks or assist with assess necessary to complete eval    OT Frequency 1x / week    OT Duration 8 weeks    OT Treatment/Interventions Self-care/ADL training;Electrical Stimulation;Therapeutic exercise;Patient/family education;Splinting;Neuromuscular education;Moist Heat;Functional Development worker, community;Therapeutic activities;Manual Therapy;DME and/or AE instruction;Contrast Bath;Ultrasound;Cryotherapy;Fluidtherapy    Plan hand shaping, question functional splint or buddy strapping - needs to attend to hand movement, decrease compensatory strategies    Consulted and Agree with Plan of Care Patient           Patient will benefit from skilled therapeutic intervention in order to improve the following deficits and impairments:   Body Structure / Function / Physical Skills: ADL,Coordination,GMC,UE functional use,Sensation,Decreased knowledge of use of DME,Flexibility,IADL,Pain,Strength,Proprioception,FMC,Dexterity,Tone,ROM       Visit Diagnosis: Apraxia  Other disturbances of skin sensation  Hemiplegia and hemiparesis following cerebral infarction affecting right  dominant side (HCC)  Pain in right hand    Problem List Patient Active Problem List   Diagnosis Date Noted  . Dyslipidemia   . Right hemiparesis (HCC)   . Slow transit constipation   . History of hypertension   . Dysphagia, post-stroke   . Acute blood loss anemia   . Controlled type 2 diabetes mellitus with complication, without long-term current use of insulin (HCC)   . Stenosis of right internal carotid artery   . Carotid stenosis, left, symptomatic s/p L ICA stent 07/11/2020  . Carotid stenosis, asymptomatic,  right 07/11/2020  . Essential hypertension 07/11/2020  . Hyperlipidemia LDL goal <70 07/11/2020  . Dysphagia due to recent stroke 07/11/2020  . Prediabetes 07/11/2020  . Middle cerebral artery embolism, left 07/04/2020  . Acute ischemic left ICA stroke (HCC) d/t LAA R ICA 07/03/2020  . Numbness 06/27/2020    Collier Salina , OTR/L 01/24/2021, 6:47 PM  [facility] [address] [place], [place], [address] Phone: [phone]   Fax:  [phone]  Name: Sharon Gilbert MRN: [id number] Date of Birth: 04-13-1941

## 2021-01-24 NOTE — Patient Instructions (Signed)
Practice these phrases:   Let's go out to dinner.   Do you want to go to the park?  Come over to have dinner.   We like to go walk on the third floor of the mall.   Let's ride around in the car.   How's your husband?  I have a 82 year old grandson named Jace.  My son's name is [place].   We like to eat at [employer].   I like to eat meatloaf, mac and cheese, greens, and cornbread.

## 2021-01-24 NOTE — Therapy (Signed)
[facility] [address] [place], [place], [address] Phone: [phone]   Fax:  [phone]  Speech Language Pathology Treatment  Patient Details  Name: Sharon Gilbert MRN: [id number] Date of Birth: 1941/07/24 Referring Provider (SLP): Linus Galas, NP   Encounter Date: 01/24/2021   End of Session - 01/24/21 1746    Visit Number 2    Number of Visits 17    Date for SLP Re-Evaluation 04/16/21    Authorization Type [employer]    SLP Start Time 1703    SLP Stop Time  1747    SLP Time Calculation (min) 44 min    Activity Tolerance Patient tolerated treatment well           Past Medical History:  Diagnosis Date  . Cataract   . Diabetes mellitus without complication [facility])     Past Surgical History:  Procedure Laterality Date  . IR CT HEAD LTD  07/04/2020  . IR INTRAVSC STENT CERV CAROTID W/O EMB-PROT MOD SED INC ANGIO  07/04/2020  . IR PERCUTANEOUS ART THROMBECTOMY/INFUSION INTRACRANIAL INC DIAG ANGIO  07/04/2020  . IR RADIOLOGIST EVAL & MGMT  10/01/2020  . RADIOLOGY WITH ANESTHESIA N/A 07/04/2020   Procedure: IR WITH ANESTHESIA;  Surgeon: Radiologist, Medication, MD;  Location: [facility] OR;  Service: Radiology;  Laterality: N/A;    There were no vitals filed for this visit.   Subjective Assessment - 01/24/21 1704    Subjective "Easter"    Currently in Pain? No/denies                 ADULT SLP TREATMENT - 01/24/21 1705      General Information   Behavior/Cognition Alert;Cooperative;Pleasant mood      Treatment Provided   Treatment provided Cognitive-Linquistic      Cognitive-Linquistic Treatment   Treatment focused on Apraxia;Aphasia;Patient/family/caregiver education    Skilled Treatment Pt exhibits significantly reduced speech intelligibility, with pt occasionally understanding 1-2 words. Pt exhibits fast rate and attempts to force words out. SLP reviewed compensations to reduce apraxic speech,  including slow rate and focusing on each word. Use of slow rate of reading and speaking tasks was significantly effective. SLP targeted production of functional sentences to use at home, in which pt able to read with ~95% intelligibility. SLP targeted verbal divergent naming task, in which pt able to name with 79% accuracy, which improved to 100% accuracy with occasional mod semantic and phonemic cues. Apraxic errors noted x7, particularly multi-syllabic words. Usual verbal and visual cues required to improve errored words.      Assessment / Recommendations / Plan   Plan Continue with current plan of care      Progression Toward Goals   Progression toward goals Progressing toward goals            SLP Education - 01/24/21 1843    Education Details slow rate, functional phrases, word finding    Person(s) Educated Patient    Methods Explanation;Demonstration;Handout;Verbal cues    Comprehension Verbalized understanding;Returned demonstration;Verbal cues required;Need further instruction            SLP Short Term Goals - 01/24/21 1843      SLP SHORT TERM GOAL #1   Title Pt will complete HEP with min A over 3 sessions    Time 4    Period Weeks    Status On-going      SLP SHORT TERM GOAL #2   Title Pt will use mulitmodal communication (gesture, draw, write  1st letter etc) to augment verbal expression with usual min A over 2 sessions    Time 4    Period Weeks    Status On-going      SLP SHORT TERM GOAL #3   Title Pt will verbally ID and correct speech errors in paragraph level reading or picture description tasks with min A over 2 sessions    Time 4    Period Weeks    Status On-going      SLP SHORT TERM GOAL #4   Title Pt will demonstrate speech compensations in simple 5 minute conversation with usual min A over 2 sessions    Time 4    Period Weeks    Status On-going      SLP SHORT TERM GOAL #5   Title Pt will identify current communication effectiveness via QOL scale in  first 2 sessions    Time 2    Period Weeks    Status On-going            SLP Long Term Goals - 01/24/21 1844      SLP LONG TERM GOAL #1   Title Pt will use mulitmodal communication (gesture, draw, write 1st letter etc) to augment verbal expression to meet needs at home with rare min A from family.    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 Pt noted with overall reduced speech intelligibility in conversation, with SLP understanding rare words. SLP reviewed compensations to improve apraxic speech, including slow rate and over-ennunication. SLP created functional phrases to practice use of compensations and increase communication effectiveness. Pt benefited from slow rate and relaxed speaking to reduce apraxic errors and dysnomia. Skilled ST intervention is warranted to address significant apraxia and aphasia impacting pt's ability to effectively communicate with familiar and unfamiliar listeners and to increase QOL.    Speech Therapy Frequency 2x / week    Duration 8 weeks   or 17 total visits   Treatment/Interventions Compensatory strategies;Functional tasks;Patient/family education;Cueing hierarchy;Environmental controls;Multimodal communcation approach;Compensatory techniques;Internal/external aids;SLP instruction and feedback;Language facilitation    Potential to Achieve Goals Fair    Potential Considerations Severity of  impairments    SLP Home Exercise Plan provided    Consulted and Agree with Plan of Care Patient           Patient will benefit from skilled therapeutic intervention in order to improve the following deficits and impairments:   Verbal apraxia  Aphasia    Problem List Patient Active Problem List   Diagnosis Date Noted  . Dyslipidemia   . Right hemiparesis (HCC)   . Slow transit constipation   . History of hypertension   . Dysphagia, post-stroke   . Acute blood loss anemia   . Controlled  type 2 diabetes mellitus with complication, without long-term current use of insulin (HCC)   . Stenosis of right internal carotid artery   . Carotid stenosis, left, symptomatic s/p L ICA stent 07/11/2020  . Carotid stenosis, asymptomatic, right 07/11/2020  . Essential hypertension 07/11/2020  . Hyperlipidemia LDL goal <70 07/11/2020  . Dysphagia due to recent stroke 07/11/2020  . Prediabetes 07/11/2020  . Middle cerebral artery embolism, left 07/04/2020  . Acute ischemic left ICA stroke (HCC) d/t LAA R ICA 07/03/2020  . Numbness 06/27/2020    Janann Colonel, MA CCC-SLP 01/24/2021, 6:47 PM  [facility] [address] [place], [place], [address] Phone: [phone]   Fax:  [phone]   Name: Sharon Gilbert MRN: [id number] Date of Birth: Apr 08, 1941

## 2021-01-25 ENCOUNTER — Ambulatory Visit (HOSPITAL_COMMUNITY)
Admission: RE | Admit: 2021-01-25 | Discharge: 2021-01-25 | Disposition: A | Discharge status: Home / Self Care | Payer: Medicare HMO | Source: Ambulatory Visit | Attending: Interventional Radiology | Admitting: Interventional Radiology

## 2021-01-25 DIAGNOSIS — Q283 Other malformations of cerebral vessels: Secondary | ICD-10-CM | POA: Diagnosis not present

## 2021-01-25 DIAGNOSIS — I6503 Occlusion and stenosis of bilateral vertebral arteries: Secondary | ICD-10-CM | POA: Diagnosis not present

## 2021-01-25 DIAGNOSIS — I639 Cerebral infarction, unspecified: Secondary | ICD-10-CM | POA: Diagnosis present

## 2021-01-25 DIAGNOSIS — I6602 Occlusion and stenosis of left middle cerebral artery: Secondary | ICD-10-CM | POA: Diagnosis not present

## 2021-01-25 DIAGNOSIS — I708 Atherosclerosis of other arteries: Secondary | ICD-10-CM | POA: Diagnosis not present

## 2021-01-25 DIAGNOSIS — I672 Cerebral atherosclerosis: Secondary | ICD-10-CM | POA: Diagnosis not present

## 2021-01-25 LAB — POCT I-STAT CREATININE: Creatinine, Ser: 0.8 mg/dL (ref 0.44–1.00)

## 2021-01-25 IMAGING — CT CT ANGIO NECK
2 of 11 series · 7 of 35 positions shown · IV contrast (omnipaque)
Comparison: Brain MRI [DATE]

CLINICAL DATA: CVA [DATE]. Continued apraxia and aphasia
with right arm weakness.

EXAM:
CT ANGIOGRAPHY HEAD AND NECK
TECHNIQUE: Multidetector CT imaging of the head and neck was performed using
the standard protocol during bolus administration of intravenous
contrast. Multiplanar CT image reconstructions and MIPs were
obtained to evaluate the vascular anatomy. Carotid stenosis
measurements (when applicable) are obtained utilizing NASCET
criteria, using the distal internal carotid diameter as the
denominator.
CONTRAST:  100mL OMNIPAQUE IOHEXOL 350 MG/ML SOLN

[Series 9: cta head neck · axial · 0.52mm/px · z∈[+1600,+1712]mm · 2 of 170 slices shown]
[im 57/170  soft-tissue]
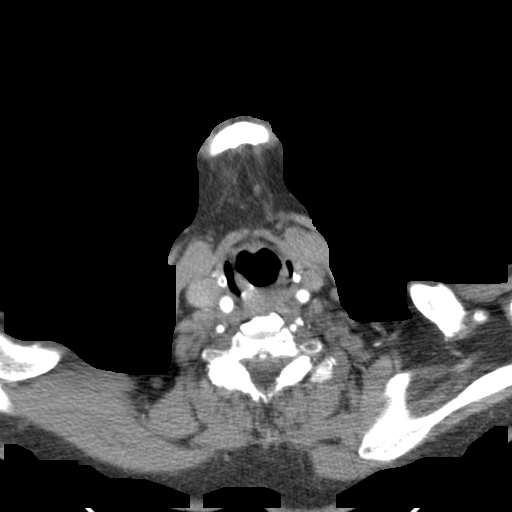
[im 113/170  soft-tissue]
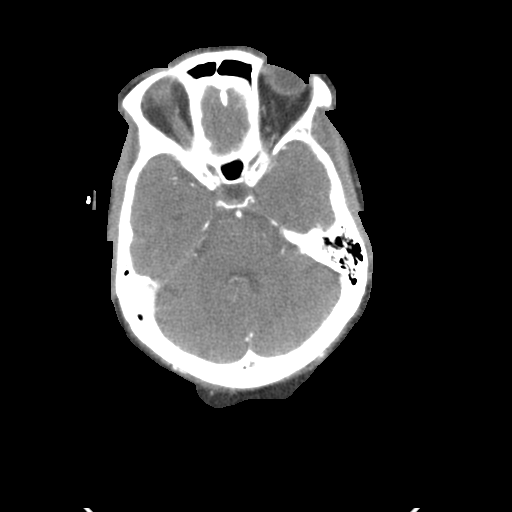

[Series 11: ax thin · axial · 0.43mm/px · z∈[+1530,+1755]mm · 5 of 339 slices shown]
[im 57/339  soft-tissue]
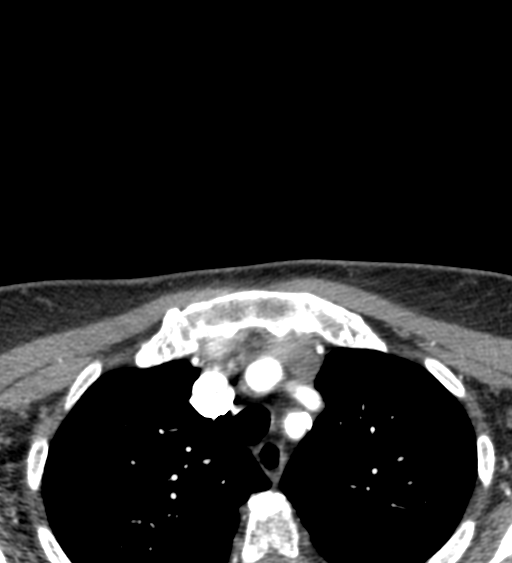
[im 113/339  bone]
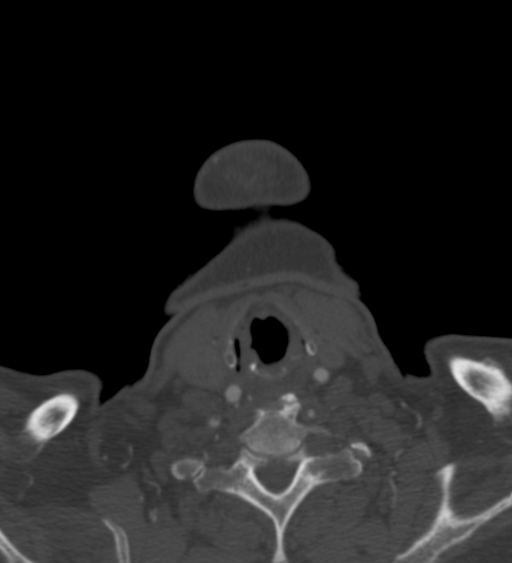
[im 170/339  soft-tissue]
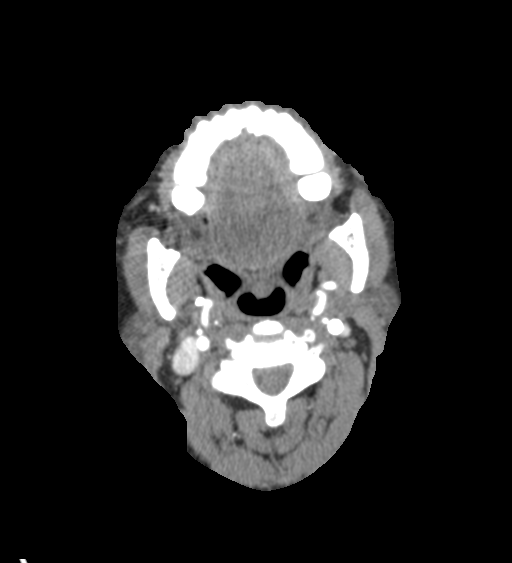
[im 226/339  bone]
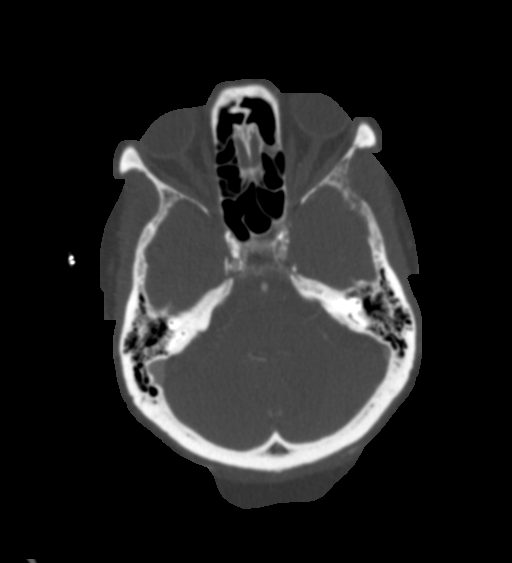
[im 282/339  soft-tissue]
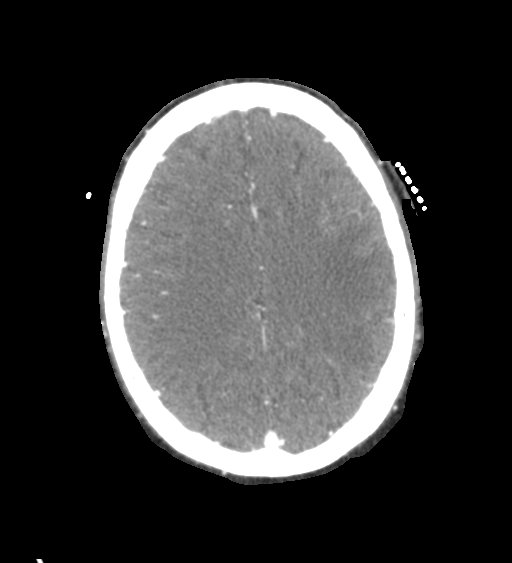

[7 of 35 positions shown; findings below may reference images not displayed]

FINDINGS: CT HEAD FINDINGS

Brain: Interval evolution of left MCA branch infarct involving a
moderate to large area. No detected infarct progression. No
hemorrhage, hydrocephalus, or masslike finding.

Vascular: See below

Skull: Negative

Sinuses: Negative

Orbits: Negative

Review of the MIP images confirms the above findings

CTA NECK FINDINGS

Aortic arch: Atheromatous plaque.  Two vessel arch.

Right carotid system: Atheromatous plaque at the right carotid
bifurcation and proximal ICA with mixed density. The plaque is
irregular with a web like morphology at the ICA origin and stenosis
is severe enough to limit accurate measurement of the lumen. No
dissection or downstream underfilling.

Left carotid system: Atheromatous wall thickening with dominant
plaque about the bifurcation. Left distal common carotid to ICA
stent with patent stent and ECA.

Vertebral arteries: Proximal subclavian atherosclerosis. The right
vertebral artery is dominant. Mild kinking of the right vertebral
artery at the V1 segment. No stenosis or dissection is seen.
Calcified plaque at the left vertebral origin with high-grade
narrowing.

Skeleton: No acute finding.

Other neck: No visible inflammation or mass.

Upper chest: Negative

Review of the MIP images confirms the above findings

CTA HEAD FINDINGS

Anterior circulation: Atheromatous plaque at the carotid siphons.
The left ICA is smaller than the right in the setting of smaller
downstream branches. High-grade narrowing and irregularity at the
left M1 segment (beyond the anterior temporal branch) which is
new/progressed from prior. Negative for aneurysm.

Posterior circulation: Vertebral and basilar arteries are smooth and
widely patent. No branch occlusion or proximal flow limiting
stenosis.

Venous sinuses: Negative

Anatomic variants: Hypoplastic left A1 segment.

Review of the MIP images confirms the above findings
IMPRESSION: 1. Chronic severe stenosis at the proximal right ICA due to
irregular plaque.
2. New (from [CB]) advanced narrowing and irregularity of the left
M1 segment.
3. Diffusely patent left cervical ICA stent.
4. High-grade narrowing at the non dominant left vertebral origin
due to atherosclerotic plaque.

## 2021-01-25 MED ORDER — IOHEXOL 350 MG/ML SOLN
100.0000 mL | Freq: Once | INTRAVENOUS | Status: AC | PRN
  Administered 2021-01-25: 100 mL via INTRAVENOUS

## 2021-01-28 ENCOUNTER — Other Ambulatory Visit (HOSPITAL_COMMUNITY): Payer: Self-pay | Admitting: Interventional Radiology

## 2021-01-28 ENCOUNTER — Telehealth: Payer: Self-pay | Admitting: Student

## 2021-01-28 DIAGNOSIS — I771 Stricture of artery: Secondary | ICD-10-CM

## 2021-01-28 NOTE — Telephone Encounter (Signed)
Dr. Corliss Skains reviewed the results of her recent CT angio head/neck and would like to see her for a consult to discuss treatment options for right ICA stenosis. A scheduler will call the patient to arrange a date/time.   Alwyn Ren, [place] [phone] 01/28/2021, 1:47 PM

## 2021-01-29 ENCOUNTER — Ambulatory Visit: Payer: Medicare HMO

## 2021-01-29 ENCOUNTER — Ambulatory Visit: Payer: Medicare HMO | Admitting: Occupational Therapy

## 2021-01-29 ENCOUNTER — Encounter: Payer: Self-pay | Admitting: Occupational Therapy

## 2021-01-29 ENCOUNTER — Other Ambulatory Visit: Payer: Self-pay

## 2021-01-29 DIAGNOSIS — I63512 Cerebral infarction due to unspecified occlusion or stenosis of left middle cerebral artery: Secondary | ICD-10-CM | POA: Diagnosis not present

## 2021-01-29 DIAGNOSIS — R2681 Unsteadiness on feet: Secondary | ICD-10-CM

## 2021-01-29 DIAGNOSIS — R4701 Aphasia: Secondary | ICD-10-CM

## 2021-01-29 DIAGNOSIS — I69351 Hemiplegia and hemiparesis following cerebral infarction affecting right dominant side: Secondary | ICD-10-CM

## 2021-01-29 DIAGNOSIS — R2689 Other abnormalities of gait and mobility: Secondary | ICD-10-CM | POA: Diagnosis not present

## 2021-01-29 DIAGNOSIS — R482 Apraxia: Secondary | ICD-10-CM

## 2021-01-29 DIAGNOSIS — R208 Other disturbances of skin sensation: Secondary | ICD-10-CM | POA: Diagnosis not present

## 2021-01-29 DIAGNOSIS — M79641 Pain in right hand: Secondary | ICD-10-CM

## 2021-01-29 DIAGNOSIS — R471 Dysarthria and anarthria: Secondary | ICD-10-CM

## 2021-01-29 NOTE — Therapy (Signed)
[facility] [address] [place], [place], [address] Phone: [phone]   Fax:  [phone]  Occupational Therapy Treatment  Patient Details  Name: Sharon Gilbert MRN: [id number] Date of Birth: 1940/11/15 Referring Provider (OT): Linus Galas, NP   Encounter Date: 01/29/2021   OT End of Session - 01/29/21 1852    Visit Number 3    Number of Visits 9    Date for OT Re-Evaluation 04/01/21    Authorization Type [employer] Medicare    OT Start Time 1705    OT Stop Time 1745    OT Time Calculation (min) 40 min    Activity Tolerance Patient tolerated treatment well    Behavior During Therapy [facility] for tasks assessed/performed           Past Medical History:  Diagnosis Date  . Cataract   . Diabetes mellitus without complication [facility])     Past Surgical History:  Procedure Laterality Date  . IR CT HEAD LTD  07/04/2020  . IR INTRAVSC STENT CERV CAROTID W/O EMB-PROT MOD SED INC ANGIO  07/04/2020  . IR PERCUTANEOUS ART THROMBECTOMY/INFUSION INTRACRANIAL INC DIAG ANGIO  07/04/2020  . IR RADIOLOGIST EVAL & MGMT  10/01/2020  . RADIOLOGY WITH ANESTHESIA N/A 07/04/2020   Procedure: IR WITH ANESTHESIA;  Surgeon: Radiologist, Medication, MD;  Location: [facility] OR;  Service: Radiology;  Laterality: N/A;    There were no vitals filed for this visit.   Subjective Assessment - 01/29/21 1721    Subjective  No pain - a little better I think    Currently in Pain? No/denies                        OT Treatments/Exercises (OP) - 01/29/21 0001      Neurological Re-education Exercises   Other Exercises 1 Neuromuscular reeducation to address grading pressure and shaping of hand for pinch, grasp, release, and initial in hand manipulation.  Patient repsonding well to cues to decrease tension in shoulder, and fingers.  Again used coban to omit influence of 3-5 digits on tip pinch of thumb and index.  Worked on orienting hand to task,  grading pressure, and shaping hand for function.  Worked on writing her name - patient with improved ability to write first name in cursive with either rubberized grip or large pen.  Need continued work on how hand approaches pen.                    OT Short Term Goals - 01/29/21 1729      OT SHORT TERM GOAL #1   Title Patient will complete a home exercise program designed to improve RUE grip strength    Time 4    Period Weeks    Status On-going    Target Date 03/02/21      OT SHORT TERM GOAL #2   Title Patient will demonstrate effective adaptive grip on pen to write her name.    Time 4    Period Weeks    Status On-going             OT Long Term Goals - 01/29/21 1853      OT LONG TERM GOAL #1   Title Patient will complete home activities program to improve functional use of RUE    Time 8    Period Weeks    Status On-going      OT LONG TERM GOAL #2  Title Patient will demonstrate improved score on box and blocks test by 5 blocks    Baseline 22    Time 8    Period Weeks    Status On-going      OT LONG TERM GOAL #3   Title Patient will complete an updated HEP designed to improve coordination and general upper body strength    Time 8    Period Weeks    Status On-going      OT LONG TERM GOAL #4   Title Patient will demonstrate at least 3 lb increase in right grip strength to improve functional grasp    Time 8    Period Weeks    Status On-going      OT LONG TERM GOAL #5   Title Patient will report no increase in pain after 5 min of right hand exercise or functional use.    Time 8    Period Weeks    Status On-going                 Plan - 01/29/21 1852    Clinical Impression Statement Patient responding well to therapy and is showing some improvement in functional use of RUE with automatic familiar tasks    OT Occupational Profile and History Detailed Assessment- Review of Records and additional review of physical, cognitive, psychosocial history  related to current functional performance    Occupational performance deficits (Please refer to evaluation for details): ADL's;IADL's    Body Structure / Function / Physical Skills ADL;Coordination;GMC;UE functional use;Sensation;Decreased knowledge of use of DME;Flexibility;IADL;Pain;Strength;Proprioception;FMC;Dexterity;Tone;ROM    Rehab Potential Fair    Clinical Decision Making Several treatment options, min-mod task modification necessary    Comorbidities Affecting Occupational Performance: May have comorbidities impacting occupational performance    Modification or Assistance to Complete Evaluation  Min-Moderate modification of tasks or assist with assess necessary to complete eval    OT Frequency 1x / week    OT Duration 8 weeks    OT Treatment/Interventions Self-care/ADL training;Electrical Stimulation;Therapeutic exercise;Patient/family education;Splinting;Neuromuscular education;Moist Heat;Functional Development worker, community;Therapeutic activities;Manual Therapy;DME and/or AE instruction;Contrast Bath;Ultrasound;Cryotherapy;Fluidtherapy    Plan hand shaping, question functional splint or buddy strapping - needs to attend to hand movement, decrease compensatory strategies    Consulted and Agree with Plan of Care Patient           Patient will benefit from skilled therapeutic intervention in order to improve the following deficits and impairments:   Body Structure / Function / Physical Skills: ADL,Coordination,GMC,UE functional use,Sensation,Decreased knowledge of use of DME,Flexibility,IADL,Pain,Strength,Proprioception,FMC,Dexterity,Tone,ROM       Visit Diagnosis: Apraxia  Other disturbances of skin sensation  Hemiplegia and hemiparesis following cerebral infarction affecting right dominant side (HCC)  Pain in right hand  Unsteadiness on feet    Problem List Patient Active Problem List   Diagnosis Date Noted  . Dyslipidemia   . Right hemiparesis (HCC)   . Slow transit  constipation   . History of hypertension   . Dysphagia, post-stroke   . Acute blood loss anemia   . Controlled type 2 diabetes mellitus with complication, without long-term current use of insulin (HCC)   . Stenosis of right internal carotid artery   . Carotid stenosis, left, symptomatic s/p L ICA stent 07/11/2020  . Carotid stenosis, asymptomatic, right 07/11/2020  . Essential hypertension 07/11/2020  . Hyperlipidemia LDL goal <70 07/11/2020  . Dysphagia due to recent stroke 07/11/2020  . Prediabetes 07/11/2020  . Middle cerebral artery embolism, left 07/04/2020  . Acute ischemic left  ICA stroke (HCC) d/t LAA R ICA 07/03/2020  . Numbness 06/27/2020    Collier Salina, OTR/L 01/29/2021, 6:54 PM  [facility] [address] [place], [place], [address] Phone: [phone]   Fax:  [phone]  Name: Sharon Gilbert MRN: [id number] Date of Birth: 11-04-1940

## 2021-01-29 NOTE — Therapy (Signed)
[facility] [address] [place], [place], [address] Phone: [phone]   Fax:  [phone]  Speech Language Pathology Treatment  Patient Details  Name: Sharon Gilbert MRN: [id number] Date of Birth: 03-20-1941 Referring Provider (SLP): Linus Galas, NP   Encounter Date: 01/29/2021   End of Session - 01/29/21 1709    Visit Number 3    Number of Visits 17    Date for SLP Re-Evaluation 04/16/21    Authorization Type [employer]    SLP Start Time 1616    SLP Stop Time  1700    SLP Time Calculation (min) 44 min    Activity Tolerance Patient tolerated treatment well           Past Medical History:  Diagnosis Date  . Cataract   . Diabetes mellitus without complication [facility])     Past Surgical History:  Procedure Laterality Date  . IR CT HEAD LTD  07/04/2020  . IR INTRAVSC STENT CERV CAROTID W/O EMB-PROT MOD SED INC ANGIO  07/04/2020  . IR PERCUTANEOUS ART THROMBECTOMY/INFUSION INTRACRANIAL INC DIAG ANGIO  07/04/2020  . IR RADIOLOGIST EVAL & MGMT  10/01/2020  . RADIOLOGY WITH ANESTHESIA N/A 07/04/2020   Procedure: IR WITH ANESTHESIA;  Surgeon: Radiologist, Medication, MD;  Location: [facility] OR;  Service: Radiology;  Laterality: N/A;    There were no vitals filed for this visit.   Subjective Assessment - 01/29/21 1617    Subjective "good good"    Currently in Pain? No/denies                 ADULT SLP TREATMENT - 01/29/21 1619      General Information   Behavior/Cognition Alert;Cooperative;Pleasant mood;Impulsive;Requires cueing      Treatment Provided   Treatment provided Cognitive-Linquistic      Cognitive-Linquistic Treatment   Treatment focused on Apraxia;Aphasia;Patient/family/caregiver education    Skilled Treatment Slightly improved speech intelligibility noted during conversation this session;. SLP provided occasional min A to utilize slow rate and correct apraxic errors during reading of  functional phrases (third floor, Cracker barrel). When apraxic error occurs, SLP educated patient on ceasing trials and starting again due to perseverations and difficulty with oral motor coordination. SLP targeted short paragraph level readings, in which pt was ~85 % intelligibile with occasional apraxic errors. Usual mod verbal and visual cues to correct apraxic errors, with frequent inconsistency and reduced awareness exhibited  Pt completed CES and Comm SF questionnaires this session to ID current communication effectiveness. SLP targeting generative naming, in which pt able to name 5-10 items in category with occasional apraxic errors and additional processing time. Dysnomia noted x2 during tasks, which pt attempted to self-correct without success.      Assessment / Recommendations / Plan   Plan Continue with current plan of care      Progression Toward Goals   Progression toward goals Progressing toward goals            SLP Education - 01/29/21 1706    Education Details slow rate, stopping when errors occur    Person(s) Educated Patient    Methods Explanation;Demonstration;Handout;Verbal cues    Comprehension Verbalized understanding;Returned demonstration;Need further instruction;Verbal cues required            SLP Short Term Goals - 01/29/21 1616      SLP SHORT TERM GOAL #1   Title Pt will complete HEP with min A over 3 sessions    Time 3    Period Weeks  Status On-going      SLP SHORT TERM GOAL #2   Title Pt will use mulitmodal communication (gesture, draw, write 1st letter etc) to augment verbal expression with usual min A over 2 sessions    Baseline 01-29-21 (spell)    Time 3    Period Weeks    Status On-going      SLP SHORT TERM GOAL #3   Title Pt will verbally ID and correct speech errors in paragraph level reading or picture description tasks with min A over 2 sessions    Time 3    Period Weeks    Status On-going      SLP SHORT TERM GOAL #4   Title Pt will  demonstrate speech compensations in simple 5 minute conversation with usual min A over 2 sessions    Time 3    Period Weeks    Status On-going      SLP SHORT TERM GOAL #5   Title Pt will identify current communication effectiveness via QOL scale in first 2 sessions    Baseline CES: 19 & Comm SF: 13    Time --    Period --    Status Achieved            SLP Long Term Goals - 01/29/21 1617      SLP LONG TERM GOAL #1   Title Pt will use mulitmodal communication (gesture, draw, write 1st letter etc) to augment verbal expression to meet needs at home with rare min A from family.    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 Pt noted with slightly improved speech intelligibility in conversation with cued slow rate, with SLP understanding more phrases this session. SLP reviewed compensations to improve apraxic speech, including slow rate and over-ennunication. SLP reviewed functional phrases to practice use of compensations and increase communication effectiveness, with min A required to demonstrate compensations. Paragraph reading task completed with occasional fading to rare apraxic errors noted. Usual mod A required to correct apraxic errors, with inconsistency demonstrated. Pt benefited from slow rate and relaxed speaking to reduce apraxic errors and dysnomia. Skilled ST intervention is warranted to address significant apraxia and aphasia  impacting pt's ability to effectively communicate with familiar and unfamiliar listeners and to increase QOL.    Speech Therapy Frequency 2x / week    Duration 8 weeks   or 17 total visits   Treatment/Interventions Compensatory strategies;Functional tasks;Patient/family education;Cueing hierarchy;Environmental controls;Multimodal communcation approach;Compensatory techniques;Internal/external aids;SLP instruction and feedback;Language facilitation    Potential to Achieve Goals Fair    Potential Considerations Severity of impairments    SLP Home Exercise Plan provided    Consulted and Agree with Plan of Care Patient  Patient will benefit from skilled therapeutic intervention in order to improve the following deficits and impairments:   Verbal apraxia  Aphasia  Dysarthria and anarthria    Problem List Patient Active Problem List   Diagnosis Date Noted  . Dyslipidemia   . Right hemiparesis (HCC)   . Slow transit constipation   . History of hypertension   . Dysphagia, post-stroke   . Acute blood loss anemia   . Controlled type 2 diabetes mellitus with complication, without long-term current use of insulin (HCC)   . Stenosis of right internal carotid artery   . Carotid stenosis, left, symptomatic s/p L ICA stent 07/11/2020  . Carotid stenosis, asymptomatic, right 07/11/2020  . Essential hypertension 07/11/2020  . Hyperlipidemia LDL goal <70 07/11/2020  . Dysphagia due to recent stroke 07/11/2020  . Prediabetes 07/11/2020  . Middle cerebral artery embolism, left 07/04/2020  . Acute ischemic left ICA stroke (HCC) d/t LAA R ICA 07/03/2020  . Numbness 06/27/2020    Janann Colonel, MA CCC-SLP 01/29/2021, 5:13 PM  [facility] [address] [place], [place], [address] Phone: [phone]   Fax:  [phone]   Name: DARYAN CAGLEY MRN: [id number] Date of Birth: September 04, 1941

## 2021-02-05 ENCOUNTER — Ambulatory Visit: Payer: Medicare HMO

## 2021-02-05 ENCOUNTER — Encounter: Payer: Self-pay | Admitting: Occupational Therapy

## 2021-02-05 ENCOUNTER — Other Ambulatory Visit: Payer: Self-pay

## 2021-02-05 ENCOUNTER — Encounter: Payer: Self-pay | Admitting: Neurology

## 2021-02-05 ENCOUNTER — Ambulatory Visit: Payer: Medicare HMO | Admitting: Neurology

## 2021-02-05 ENCOUNTER — Ambulatory Visit: Payer: Medicare HMO | Admitting: Occupational Therapy

## 2021-02-05 VITALS — BP 135/80 | HR 55 | Ht 65.0 in | Wt 135.6 lb

## 2021-02-05 DIAGNOSIS — R482 Apraxia: Secondary | ICD-10-CM | POA: Insufficient documentation

## 2021-02-05 DIAGNOSIS — I69351 Hemiplegia and hemiparesis following cerebral infarction affecting right dominant side: Secondary | ICD-10-CM

## 2021-02-05 DIAGNOSIS — R4701 Aphasia: Secondary | ICD-10-CM | POA: Diagnosis not present

## 2021-02-05 DIAGNOSIS — R2681 Unsteadiness on feet: Secondary | ICD-10-CM | POA: Diagnosis not present

## 2021-02-05 DIAGNOSIS — I6501 Occlusion and stenosis of right vertebral artery: Secondary | ICD-10-CM | POA: Diagnosis not present

## 2021-02-05 DIAGNOSIS — I6521 Occlusion and stenosis of right carotid artery: Secondary | ICD-10-CM

## 2021-02-05 DIAGNOSIS — M79641 Pain in right hand: Secondary | ICD-10-CM | POA: Diagnosis not present

## 2021-02-05 DIAGNOSIS — R208 Other disturbances of skin sensation: Secondary | ICD-10-CM | POA: Diagnosis not present

## 2021-02-05 NOTE — Therapy (Signed)
[facility] [address] [place], [place], [address] Phone: [phone]   Fax:  [phone]  Occupational Therapy Treatment  Patient Details  Name: Sharon Gilbert MRN: [id number] Date of Birth: January 06, 1941 Referring Provider (OT): Linus Galas, NP   Encounter Date: 02/05/2021   OT End of Session - 02/05/21 1110    Visit Number 4    Number of Visits 9    Date for OT Re-Evaluation 04/01/21    Authorization Type [employer] Medicare    OT Start Time 1109   pt late to therapy   OT Stop Time 1145    OT Time Calculation (min) 36 min    Activity Tolerance Patient tolerated treatment well    Behavior During Therapy [facility] for tasks assessed/performed           Past Medical History:  Diagnosis Date  . Cataract   . Diabetes mellitus without complication [facility])     Past Surgical History:  Procedure Laterality Date  . IR CT HEAD LTD  07/04/2020  . IR INTRAVSC STENT CERV CAROTID W/O EMB-PROT MOD SED INC ANGIO  07/04/2020  . IR PERCUTANEOUS ART THROMBECTOMY/INFUSION INTRACRANIAL INC DIAG ANGIO  07/04/2020  . IR RADIOLOGIST EVAL & MGMT  10/01/2020  . RADIOLOGY WITH ANESTHESIA N/A 07/04/2020   Procedure: IR WITH ANESTHESIA;  Surgeon: Radiologist, Medication, MD;  Location: [facility] OR;  Service: Radiology;  Laterality: N/A;    There were no vitals filed for this visit.   Subjective Assessment - 02/05/21 1110    Subjective  Pt denies any pain. I think it's getting a little better.    Currently in Pain? No/denies           Functional Use with RUE picking up connect four chips and placing into medium cone with min difficulty. Pt held die in palm to encourage increase pincer and tripod grasp (using first 3 fingers) with picking up chips. Stacking 1 inch blocks with further encouraging of tripod grasp or pincer grasp on blocks with RUE. Pt only required min cues for hand positioning - good grasp on blocks.  Handwriting trialed various grips.  Pt had most success with tan/brown foam grip. Worked with rubber grip, whiffle ball, wishbone and tan/brown foam grip this day.  Flipping Cards for increase coordination in RUE. Min cues for decreasing shoulder hiking. Mod difficulty                      OT Short Term Goals - 01/29/21 1729      OT SHORT TERM GOAL #1   Title Patient will complete a home exercise program designed to improve RUE grip strength    Time 4    Period Weeks    Status On-going    Target Date 03/02/21      OT SHORT TERM GOAL #2   Title Patient will demonstrate effective adaptive grip on pen to write her name.    Time 4    Period Weeks    Status On-going             OT Long Term Goals - 01/29/21 1853      OT LONG TERM GOAL #1   Title Patient will complete home activities program to improve functional use of RUE    Time 8    Period Weeks    Status On-going      OT LONG TERM GOAL #2   Title Patient will demonstrate improved score on box and  blocks test by 5 blocks    Baseline 22    Time 8    Period Weeks    Status On-going      OT LONG TERM GOAL #3   Title Patient will complete an updated HEP designed to improve coordination and general upper body strength    Time 8    Period Weeks    Status On-going      OT LONG TERM GOAL #4   Title Patient will demonstrate at least 3 lb increase in right grip strength to improve functional grasp    Time 8    Period Weeks    Status On-going      OT LONG TERM GOAL #5   Title Patient will report no increase in pain after 5 min of right hand exercise or functional use.    Time 8    Period Weeks    Status On-going                 Plan - 02/05/21 1114    Clinical Impression Statement Pt progressing towards goals. Pt showing increased coordination with RUE with grasp/release.    OT Occupational Profile and History Detailed Assessment- Review of Records and additional review of physical, cognitive, psychosocial history related to  current functional performance    Occupational performance deficits (Please refer to evaluation for details): ADL's;IADL's    Body Structure / Function / Physical Skills ADL;Coordination;GMC;UE functional use;Sensation;Decreased knowledge of use of DME;Flexibility;IADL;Pain;Strength;Proprioception;FMC;Dexterity;Tone;ROM    Rehab Potential Fair    Clinical Decision Making Several treatment options, min-mod task modification necessary    Comorbidities Affecting Occupational Performance: May have comorbidities impacting occupational performance    Modification or Assistance to Complete Evaluation  Min-Moderate modification of tasks or assist with assess necessary to complete eval    OT Frequency 1x / week    OT Duration 8 weeks    OT Treatment/Interventions Self-care/ADL training;Electrical Stimulation;Therapeutic exercise;Patient/family education;Splinting;Neuromuscular education;Moist Heat;Functional Development worker, community;Therapeutic activities;Manual Therapy;DME and/or AE instruction;Contrast Bath;Ultrasound;Cryotherapy;Fluidtherapy    Plan hand shaping, question functional splint or buddy strapping - needs to attend to hand movement, decrease compensatory strategies    Consulted and Agree with Plan of Care Patient           Patient will benefit from skilled therapeutic intervention in order to improve the following deficits and impairments:   Body Structure / Function / Physical Skills: ADL,Coordination,GMC,UE functional use,Sensation,Decreased knowledge of use of DME,Flexibility,IADL,Pain,Strength,Proprioception,FMC,Dexterity,Tone,ROM       Visit Diagnosis: Hemiplegia and hemiparesis following cerebral infarction affecting right dominant side (HCC)  Pain in right hand  Unsteadiness on feet    Problem List Patient Active Problem List   Diagnosis Date Noted  . Dyslipidemia   . Right hemiparesis (HCC)   . Slow transit constipation   . History of hypertension   . Dysphagia,  post-stroke   . Acute blood loss anemia   . Controlled type 2 diabetes mellitus with complication, without long-term current use of insulin (HCC)   . Stenosis of right internal carotid artery   . Carotid stenosis, left, symptomatic s/p L ICA stent 07/11/2020  . Carotid stenosis, asymptomatic, right 07/11/2020  . Essential hypertension 07/11/2020  . Hyperlipidemia LDL goal <70 07/11/2020  . Dysphagia due to recent stroke 07/11/2020  . Prediabetes 07/11/2020  . Middle cerebral artery embolism, left 07/04/2020  . Acute ischemic left ICA stroke (HCC) d/t LAA R ICA 07/03/2020  . Numbness 06/27/2020    Junious Dresser MOT, OTR/L  02/05/2021, 11:44 AM  [facility] [address] [place], [place], [address] Phone: [phone]   Fax:  [phone]  Name: Sharon Gilbert MRN: [id number] Date of Birth: 1941-07-10

## 2021-02-05 NOTE — Therapy (Signed)
[facility] [address] [place], [place], [address] Phone: [phone]   Fax:  [phone]  Speech Language Pathology Treatment  Patient Details  Name: Sharon Gilbert MRN: [id number] Date of Birth: October 26, 1940 Referring Provider (SLP): Linus Galas, NP   Encounter Date: 02/05/2021   End of Session - 02/05/21 1423    Visit Number 4    Number of Visits 17    Date for SLP Re-Evaluation 04/16/21    Authorization Type [employer]na Medicare    SLP Start Time 1145    SLP Stop Time  1230    SLP Time Calculation (min) 45 min    Activity Tolerance Patient tolerated treatment well           Past Medical History:  Diagnosis Date  . Cataract   . Diabetes mellitus without complication [facility])     Past Surgical History:  Procedure Laterality Date  . IR CT HEAD LTD  07/04/2020  . IR INTRAVSC STENT CERV CAROTID W/O EMB-PROT MOD SED INC ANGIO  07/04/2020  . IR PERCUTANEOUS ART THROMBECTOMY/INFUSION INTRACRANIAL INC DIAG ANGIO  07/04/2020  . IR RADIOLOGIST EVAL & MGMT  10/01/2020  . RADIOLOGY WITH ANESTHESIA N/A 07/04/2020   Procedure: IR WITH ANESTHESIA;  Surgeon: Radiologist, Medication, MD;  Location: [facility] OR;  Service: Radiology;  Laterality: N/A;    There were no vitals filed for this visit.   Subjective Assessment - 02/05/21 1147    Subjective "fine"    Currently in Pain? No/denies                 ADULT SLP TREATMENT - 02/05/21 1147      General Information   Behavior/Cognition Alert;Cooperative;Pleasant mood;Impulsive;Requires cueing      Treatment Provided   Treatment provided Cognitive-Linquistic      Cognitive-Linquistic Treatment   Treatment focused on Apraxia;Aphasia;Patient/family/caregiver education    Skilled Treatment SLP trialed generation of word associations via fill in blank. Pt able to read starter with occasional min A for apraxic errrors. Min increasing to mod A required to name association due to  perseverations and apraxic errors. Pt read functional sentences x10 with only apraxic error x1 utilizing slow rate. SLP re-educated patient on stopping verbalization and re-attempting as pt becomes tense and over-reactive on corrections, which further impacts ability to correct error. SLP used visual cues this session to correct errors due to reduced awareness (ex: good-my versus good-bye).      Assessment / Recommendations / Plan   Plan Continue with current plan of care      Progression Toward Goals   Progression toward goals Progressing toward goals            SLP Education - 02/05/21 1423    Education Details HEP, slow rate, stopping when errors occur    Person(s) Educated Patient    Methods Explanation;Demonstration;Handout;Verbal cues    Comprehension Verbalized understanding;Returned demonstration;Verbal cues required;Need further instruction            SLP Short Term Goals - 02/05/21 1424      SLP SHORT TERM GOAL #1   Title Pt will complete HEP with min A over 3 sessions    Baseline 02-05-21    Time 2    Period Weeks   or 9 visits for all STGs   Status On-going      SLP SHORT TERM GOAL #2   Title Pt will use mulitmodal communication (gesture, draw, write 1st letter etc) to augment verbal expression with  usual min A over 2 sessions    Baseline 01-29-21 (spell)    Time 2    Period Weeks    Status On-going      SLP SHORT TERM GOAL #3   Title Pt will verbally ID and correct speech errors in paragraph level reading or picture description tasks with min A over 2 sessions    Time 2    Period Weeks    Status On-going      SLP SHORT TERM GOAL #4   Title Pt will demonstrate speech compensations in simple 5 minute conversation with usual min A over 2 sessions    Time 2    Period Weeks    Status On-going      SLP SHORT TERM GOAL #5   Title Pt will identify current communication effectiveness via QOL scale in first 2 sessions    Baseline CES: 19 & Comm SF: 13    Status  Achieved            SLP Long Term Goals - 02/05/21 1425      SLP LONG TERM GOAL #1   Title Pt will use mulitmodal communication (gesture, draw, write 1st letter etc) to augment verbal expression to meet needs at home with rare min A from family.    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 Pt noted with slightly improved speech intelligibility in conversation with cued slow rate, with SLP understanding more phrases this session. SLP reviewed compensations to improve apraxic speech, including slow rate and over-ennunication. SLP reviewed functional phrases to practice use of compensations and increase communication effectiveness, with rare min A required to demonstrate compensations. Structured naming task completed with rare increasing to usual apraxic errors noted, which may be related to fatigue. Usual mod A required to correct apraxic errors, with inconsistency demonstrated. Pt benefited from cued slow rate and relaxed speaking to reduce apraxic errors and dysnomia. Skilled ST intervention is warranted to address significant apraxia and aphasia impacting pt's ability to effectively communicate with familiar and unfamiliar listeners and to increase QOL.    Speech Therapy Frequency 2x / week    Duration 8 weeks   or 17 total visits   Treatment/Interventions Compensatory strategies;Functional tasks;Patient/family  education;Cueing hierarchy;Environmental controls;Multimodal communcation approach;Compensatory techniques;Internal/external aids;SLP instruction and feedback;Language facilitation    Potential to Achieve Goals Fair    Potential Considerations Severity of impairments    SLP Home Exercise Plan provided    Consulted and Agree with Plan of Care Patient           Patient will benefit from skilled therapeutic intervention in order to improve the following deficits and impairments:   Verbal apraxia  Aphasia  Problem List Patient Active Problem List   Diagnosis Date Noted  . Dyslipidemia   . Right hemiparesis (HCC)   . Slow transit constipation   . History of hypertension   . Dysphagia, post-stroke   . Acute blood loss anemia   . Controlled type 2 diabetes mellitus with complication, without long-term current use of insulin (HCC)   . Stenosis of right internal carotid artery   . Carotid stenosis, left, symptomatic s/p L ICA stent 07/11/2020  . Carotid stenosis, asymptomatic, right 07/11/2020  . Essential hypertension 07/11/2020  . Hyperlipidemia LDL goal <70 07/11/2020  . Dysphagia due to recent stroke 07/11/2020  . Prediabetes 07/11/2020  . Middle cerebral artery embolism, left 07/04/2020  . Acute ischemic left ICA stroke (HCC) d/t LAA R ICA 07/03/2020  . Numbness 06/27/2020    Janann Colonel, MA CCC-SLP 02/05/2021, 2:26 PM  [facility] [address] [place], [place], [address] Phone: [phone]   Fax:  [phone]   Name: Sharon Gilbert MRN: [id number] Date of Birth: May 12, 1941

## 2021-02-05 NOTE — Progress Notes (Signed)
[facility] [address] [place]. [place] [address] [phone]       HOSPITAL FOLLOW UP NOTE  [name] Date of Birth:  [birth year] Medical Record Number:  [id number]   Reason for Referral:  hospital stroke follow up    SUBJECTIVE:   CHIEF COMPLAINT:  Chief Complaint  Patient presents with  . Follow-up    Routine Visit Room 9, son LC in room    HPI:   [name] a 80 y.o.femalewith history of cataracts and diabeteswho presented on 07/03/2020 with slurred speech, and mild face droop. Personally reviewed hospitalization pertinent progress notes, lab work and imaging with summary provided.  Stroke work-up revealed L MCA infarct due to L ICA and M2 occlusion from large vessel disease with subsequent neuro worsening s/p L ICA stent and left MCA M1 thrombectomy with postprocedure hemorrhagic conversion at L MCA infarct.  Previously on aspirin and added Brilinta post stent for secondary stroke prevention.  Advised follow-up with Dr. Corliss Skains outpatient for surveillance monitoring for L ICA s/p stent and R ICA high-grade near occlusive stenosis with string sign. Hx of HTN on lisinopril-hydrochlorothiazide PTA and recommended long-term BP goal 130-150 given ICA stenosis.  LDL 202 and initiated atorvastatin 80 mg daily.  Pre-DM with A1c 6.1.  Other stroke risk factors include advanced age but no prior stroke history.  Residual deficits of expressive aphasia, dysphagia, right hemiparesis and decreased sensory.  Evaluated by therapies and recommended discharge to CIR for ongoing therapy needs.  She was discharged to CIR on 07/11/2020.  Stroke: L MCA infarct due to L ICA and M2 occlusion from large vessel disease with subsequent neuro worsening s/p ICA stent and left MCA thrombectomy. Postprocedure hemorrhagic conversion at L MCA infarct   CT head9/28No acute abnormality. Mild cerebral Atrophy. ASPECTS 10.   CTA head and neck L ICA occlusion  beyond origin and remains occluded through neck. Proximal L M2 occlusion.High-grade near occlusive stenosis proximal R ICA w/ string sign. Intracranial R ICA w/ moderate stenosis cavernous segment. L VA origin w/ severe atherosclerosis narrowing.   CT perfusion 80mL penumbra L frontal operculum w/o Core  MRI 9/28Multiple small L frontal and parietal lobe infarcts.   IR - 07/04/20- Lt MCA M1 occlusion with TICI 3 revascularization. S/P revascularization of Lt ICA prox with stent assisted angioplasty.  CT Head 9/30 - increased SAH which now extends into the basal cisterns. Cytotoxic edema in the left insula and lateral frontal lobe  MRI 07/07/20 -Evolving moderate-sized acute left MCA infarct with similarappearance of parenchymal hemorrhage in the left insular region andsubarachnoid hemorrhage compared to the prior CT.  MRA 07/07/20 -Intracranial atherosclerosis with mild right and mild-to-moderate left ICA stenoses. The left ICA remains patent following revascularization.  2D Echo- EF 70 - 75%. No cardiac source of emboli identified.   FBP102   HgbA1c6.1   P2Y12 19  VTE prophylaxis - Lovenox 40 mg sq daily   aspirin 81 mg dailyprior to admission, now on aspirin 81 mg daily and Brilinta (ticagrelor) 90 mg bid. Continue on discharge.  Therapy recommendations: CIR  Disposition: CIR  Today, 08/22/2020, Sharon Gilbert is being seen for hospital follow-up accompanied by her son.  After 15 days, she was discharged home from CIR on 07/26/2020 with recommended [facility] PT/OT/SLP for residual right hemiparesis, right inattention, dysphagia, aphasia and apraxia.  Since discharge, reports residual expressive aphasia, dysphagia, RUE weakness and imbalance. Eating softened foods but regular liquids without difficulty. Currently working with [facility] SLP/PT/OT with  ongoing improvement.  Denies new or worsening stroke/TIA symptoms.  She is currently living with her elderly sister and is able to maintain  majority of ADLs independently.  She does require assistance for IADLs such as cooking, cleaning, bill paying and transportation as she is not driving.  She was previously living independently.  Has continued on aspirin and Brilinta without bleeding or bruising. Son does question switching Brilinta to a different medication due to cost which she reports is currently $250/month.  She has scheduled visit with [facility] Dr. Corliss Skains on 09/07/2020 for initial hospital follow-up.  He also questions why there is no intervention performed while she was in the hospital for her right carotid stenosis.  Has continued on atorvastatin 80 mg daily without myalgias.  Blood pressure today 131/69.  Monitors at home and typically 120-130s/80s. Son reports dietary modifications and increasing fluid intake.  No further concerns at this time  Update 02/05/2021: She returns for follow-up after last visit with Sharon Gilbert nurse practitioner 6 months ago.  She is accompanied by her son.  Patient has noticed improvement in her speech dose she is asked to be speak quite slowly.  She has occasional word substitutions but mostly is able to carry out a conversation and can comprehend quite well.  States she still has hand weakness and diminished fine motor skills.  Physical therapist recommend that she needs to do home exercises which she has not been doing.  Patient underwent CT angiogram of the brain on 01/26/2021 which I personally reviewed shows chronic severe right ICA stenosis which is persistent and new narrowing of the left M1 middle cerebral artery with patent proximal left ICA stent.  There is also severe stenosis at the origin of the dominant left vertebral artery origin.  Patient is scheduled to meet with Dr. Corliss Skains later this week to discuss revascularization options.  She remains on aspirin and Brilinta which is tolerating well without bruising or bleeding.  Her blood pressure is under good control and today it is 135/80.  She remains on  Crestor which she is tolerating well without muscle aches and pains.  She has not had any new recurrent stroke or TIA symptoms.  Patient is living at home and is mostly independent in most activities of daily living and needs very little help with ADLs.  The family provides good support.  ROS:   Positive for word finding difficulties, speech difficulties, and weakness, diminished fine motor skills, gait difficulty and all other systems negative  PMH:  Past Medical History:  Diagnosis Date  . Cataract   . Diabetes mellitus without complication (HCC)     PSH:  Past Surgical History:  Procedure Laterality Date  . IR CT HEAD LTD  07/04/2020  . IR INTRAVSC STENT CERV CAROTID W/O EMB-PROT MOD SED INC ANGIO  07/04/2020  . IR PERCUTANEOUS ART THROMBECTOMY/INFUSION INTRACRANIAL INC DIAG ANGIO  07/04/2020  . IR RADIOLOGIST EVAL & MGMT  10/01/2020  . RADIOLOGY WITH ANESTHESIA N/A 07/04/2020   Procedure: IR WITH ANESTHESIA;  Surgeon: Radiologist, Medication, MD;  Location: [facility] OR;  Service: Radiology;  Laterality: N/A;    Social History:  Social History   Socioeconomic History  . Marital status: Married    Spouse name: Not on file  . Number of children: Not on file  . Years of education: Not on file  . Highest education level: Not on file  Occupational History  . Not on file  Tobacco Use  . Smoking status: Never Smoker  . Smokeless  tobacco: Never Used  Substance and Sexual Activity  . Alcohol use: No  . Drug use: No  . Sexual activity: Yes  Other Topics Concern  . Not on file  Social History Narrative   Lives with son   Right Handed   Drinks caffeine occassionally   Social Determinants of Health   Financial Resource Strain: Not on file  Food Insecurity: Not on file  Transportation Needs: Not on file  Physical Activity: Not on file  Stress: Not on file  Social Connections: Not on file  Intimate Partner Violence: Not on file    Family History: History reviewed. No pertinent  family history.  Medications:   Current Outpatient Medications on File Prior to Visit  Medication Sig Dispense Refill  . acetaminophen (TYLENOL) 325 MG tablet Take 1-2 tablets (325-650 mg total) by mouth every 4 (four) hours as needed for mild pain.    Marland Kitchen. aspirin 81 MG chewable tablet Chew 1 tablet (81 mg total) by mouth daily.    Marland Kitchen. atorvastatin (LIPITOR) 80 MG tablet Take 1 tablet (80 mg total) by mouth daily at 6 PM. 60 tablet 0  . clopidogrel (PLAVIX) 75 MG tablet 1 tablet    . DORZOLAMIDE HCL-TIMOLOL MAL OP     . dorzolamide-timolol (COSOPT) 22.3-6.8 MG/ML ophthalmic solution Place 1 drop into both eyes 2 (two) times daily. 10 mL 12  . ezetimibe (ZETIA) 10 MG tablet 1/2-1 tablet    . folic acid (FOLVITE) 1 MG tablet 1 tablet    . hydrOXYzine (VISTARIL) 25 MG capsule 1 capsule as needed    . latanoprost (XALATAN) 0.005 % ophthalmic solution Place 1 drop into both eyes at bedtime. 2.5 mL 12  . lisinopril (ZESTRIL) 10 MG tablet Take 0.5 tablets (5 mg total) by mouth daily. 30 tablet 0  . Menthol-Methyl Salicylate (MUSCLE RUB) 10-15 % CREA Apply 1 application topically 2 (two) times daily.  0  . methotrexate (RHEUMATREX) 2.5 MG tablet See admin instructions.    . naproxen (NAPROSYN) 500 MG tablet 1 tablet with food or milk as needed    . polyethylene glycol (MIRALAX / GLYCOLAX) 17 g packet Take 17 g by mouth 2 (two) times daily. 60 each 0  . rosuvastatin (CRESTOR) 10 MG tablet 1 tablet    . senna-docusate (SENOKOT-S) 8.6-50 MG tablet Take 1 tablet by mouth at bedtime. 30 tablet 0  . ticagrelor (BRILINTA) 90 MG TABS tablet Take 1 tablet (90 mg total) by mouth 2 (two) times daily. 60 tablet 0   No current facility-administered medications on file prior to visit.    Allergies:   Allergies  Allergen Reactions  . Lipitor [Atorvastatin] Other (See Comments)    Muscle pain  . Topiramate Other (See Comments)    Vomiting & Wheezing      OBJECTIVE:  Physical Exam  Vitals:   02/05/21  0906  BP: 135/80  Pulse: (!) 55  Weight: 135 lb 9.6 oz (61.5 kg)  Height: 5\' 5"  (1.651 m)   Body mass index is 22.57 kg/m. No exam data present  General: Frail  pleasant elderly African-American female, seated, in no evident distress Head: head normocephalic and atraumatic.   Neck: supple with no carotid or supraclavicular bruits Cardiovascular: regular rate and rhythm, no murmurs Musculoskeletal: no deformity Skin:  no rash/petichiae Vascular:  Normal pulses all extremities   Neurologic Exam Mental Status: Awake and fully alert.   Mild expressive aphasia with slight receptive aphasia.  Nonfluent speech.  No paraphasic errors.  Able to name repeat quite well.  Able to follow simple step commands without difficulty.  Difficulty assessing orientation due to aphasia. Mood and affect appropriate.  Cranial Nerves: Fundoscopic exam not done. Pupils equal, briskly reactive to light. Extraocular movements full without nystagmus. Visual fields full to confrontation. Hearing intact. Facial sensation intact.  Mild right lower facial weakness.  Tongue, palate moves normally and symmetrically.  Motor: Normal bulk and tone. Normal strength in all tested extremity muscles except RUE 4/5 with mildly weak grip strength and decreased finger dexterity.  Orbits left over right upper extremity. Sensory.:  Altered sensation right upper and lower extremity compared to left side Coordination: Rapid alternating movements normal in all extremities except decreased right hand. Finger-to-nose performed accurately LUE and heel-to-shin performed accurately bilaterally. Gait and Station: Arises from chair without difficulty. Stance is normal. Gait demonstrates normal stride length with mild imbalance greater with turns.  No assistive device.  Mild difficulty with tandem walk and heel toe walk.  Romberg negative. Reflexes: 1+ and symmetric. Toes downgoing.     NIHSS 3 1a.  Level of consciousness 0 1b. LOC questions  0 1c. LOC commands 0 2.  Best gaze 0 3.  Visual 0 4.  Facial palsy 1 5a.  Motor arm-left 0 5b.  Motor arm-right 0 6a.  Motor leg-left 0 6b.  Motor leg-right 0 7.  Limb ataxia 0 8.  Sensory 1 9.  Best language 1 10.  Dysarthria 0 11.  Extinction and inattention 0  Modified Rankin  3     ASSESSMENT: Sharon Gilbert is a 80 y.o. year old female with L MCA infarct due to left ICA and M2 occlusion for large vessel disease with subsequent neuro worsening s/p L ICA stent and left MCA M2 thrombectomy with postprocedure hemorrhagic conversion at left MCA infarct on 07/03/2020 after presenting with slurred speech, right facial droop, aphasia and RUE weakness.  Vascular risk factors include HTN, HLD, pre-DM, b/l carotid stenosis and advanced age.  Repeat CT angiogram on 01/26/2021 shows persistent chronic severe right ICA stenosis as well as new narrowing of the left M1 but patent left proximal ICA stent.  There is also severe stenosis of the dominant left vertebral artery origin.     PLAN:  I had a long d/w patient and her son about her recent stroke, severe right carotid and left vertebral origin stenosis on recent Ct angiogram and need to consider revascularization options with Dr Corliss Skains, risk for recurrent stroke/TIAs, personally independently reviewed imaging studies and stroke evaluation results and answered questions.Continue aspirin 81 mg daily and Brilinta 90 mg twice daily given history of carotid stent for secondary stroke prevention and maintain strict control of hypertension with blood pressure goal below 130/90, diabetes with hemoglobin A1c goal below 6.5% and lipids with LDL cholesterol goal below 70 mg/dL. I also advised the patient to eat a healthy diet with plenty of whole grains, cereals, fruits and vegetables, exercise regularly and maintain ideal body weight she was advised to keep a schedule appointment with Dr. Corliss Skains later this week to discuss revascularization options.  I  encouraged the patient to do regular hand exercises to improve fine motor skills at home.  Followup in the future with my nurse practitioner Sharon Gilbert in 6 months or call earlier if necessary. I spent 45 minutes of face-to-face and non-face-to-face time with patient.  This included previsit chart review including pertinent hospitalization progress notes, lab work and imaging, lab review, study review, order entry, electronic health record  documentation, patient and son education and discussion regarding recent stroke, residual deficits, importance of managing stroke risk factors and answered all questions to patient satisfaction    Ihor Austin, [facility]  [facility] [address] [place], [place] [address]  Phone [phone] Fax [phone] Note: This document was prepared with digital dictation and possible smart phrase technology. Any transcriptional errors that result from this process are unintentional.

## 2021-02-05 NOTE — Patient Instructions (Signed)
I had a long d/w patient and her son about her recent stroke, severe right carotid and left vertebral origin stenosis and need to consider revascularization options with Dr Corliss Skains, risk for recurrent stroke/TIAs, personally independently reviewed imaging studies and stroke evaluation results and answered questions.Continue aspirin 81 mg daily and Brilinta 90 mg twice daily given history of carotid stent for secondary stroke prevention and maintain strict control of hypertension with blood pressure goal below 130/90, diabetes with hemoglobin A1c goal below 6.5% and lipids with LDL cholesterol goal below 70 mg/dL. I also advised the patient to eat a healthy diet with plenty of whole grains, cereals, fruits and vegetables, exercise regularly and maintain ideal body weight she was advised to keep a schedule appointment with Dr. Corliss Skains later this week to discuss revascularization options.  I encouraged the patient to do regular hand exercises to improve fine motor skills at home.  Followup in the future with my nurse practitioner [name] in 6 months or call earlier if necessary.

## 2021-02-06 ENCOUNTER — Ambulatory Visit (HOSPITAL_COMMUNITY)
Admission: RE | Admit: 2021-02-06 | Discharge: 2021-02-06 | Disposition: A | Discharge status: Home / Self Care | Payer: Medicare HMO | Source: Ambulatory Visit | Attending: Interventional Radiology | Admitting: Interventional Radiology

## 2021-02-06 DIAGNOSIS — I771 Stricture of artery: Secondary | ICD-10-CM

## 2021-02-06 DIAGNOSIS — I63231 Cerebral infarction due to unspecified occlusion or stenosis of right carotid arteries: Secondary | ICD-10-CM | POA: Diagnosis not present

## 2021-02-07 HISTORY — PX: IR RADIOLOGIST EVAL & MGMT: IMG5224

## 2021-02-12 ENCOUNTER — Other Ambulatory Visit: Payer: Self-pay

## 2021-02-12 ENCOUNTER — Ambulatory Visit: Payer: Medicare HMO

## 2021-02-12 ENCOUNTER — Encounter: Payer: Self-pay | Admitting: Occupational Therapy

## 2021-02-12 ENCOUNTER — Encounter: Payer: Medicare HMO | Admitting: Occupational Therapy

## 2021-02-12 ENCOUNTER — Ambulatory Visit: Payer: Medicare HMO | Admitting: Occupational Therapy

## 2021-02-12 DIAGNOSIS — R4701 Aphasia: Secondary | ICD-10-CM

## 2021-02-12 DIAGNOSIS — R2681 Unsteadiness on feet: Secondary | ICD-10-CM

## 2021-02-12 DIAGNOSIS — I69351 Hemiplegia and hemiparesis following cerebral infarction affecting right dominant side: Secondary | ICD-10-CM | POA: Diagnosis not present

## 2021-02-12 DIAGNOSIS — M79641 Pain in right hand: Secondary | ICD-10-CM

## 2021-02-12 DIAGNOSIS — R208 Other disturbances of skin sensation: Secondary | ICD-10-CM

## 2021-02-12 DIAGNOSIS — R482 Apraxia: Secondary | ICD-10-CM

## 2021-02-12 NOTE — Therapy (Signed)
[facility] [address] [place], [place], [address] Phone: [phone]   Fax:  [phone]  Occupational Therapy Treatment  Patient Details  Name: Sharon Gilbert MRN: [id number] Date of Birth: 1941/01/06 Referring Provider (OT): Linus Galas, NP   Encounter Date: 02/12/2021   OT End of Session - 02/12/21 1740    Visit Number 5    Number of Visits 9    Date for OT Re-Evaluation 04/01/21    Authorization Type [employer]    Progress Note Due on Visit 10    OT Start Time 1700    OT Stop Time 1743    OT Time Calculation (min) 43 min    Activity Tolerance Patient tolerated treatment well    Behavior During Therapy [facility] for tasks assessed/performed           Past Medical History:  Diagnosis Date  . Cataract   . Diabetes mellitus without complication [facility])     Past Surgical History:  Procedure Laterality Date  . IR CT HEAD LTD  07/04/2020  . IR INTRAVSC STENT CERV CAROTID W/O EMB-PROT MOD SED INC ANGIO  07/04/2020  . IR PERCUTANEOUS ART THROMBECTOMY/INFUSION INTRACRANIAL INC DIAG ANGIO  07/04/2020  . IR RADIOLOGIST EVAL & MGMT  10/01/2020  . IR RADIOLOGIST EVAL & MGMT  02/07/2021  . RADIOLOGY WITH ANESTHESIA N/A 07/04/2020   Procedure: IR WITH ANESTHESIA;  Surgeon: Radiologist, Medication, MD;  Location: [facility] OR;  Service: Radiology;  Laterality: N/A;    There were no vitals filed for this visit.   Subjective Assessment - 02/12/21 1707    Subjective  Much better    Currently in Pain? No/denies              [facility] OT Assessment - 02/12/21 0001      Hand Function   Right Hand Gross Grasp Impaired    Right Hand Grip (lbs) 19                    OT Treatments/Exercises (OP) - 02/12/21 0001      Neurological Re-education Exercises   Other Exercises 1 Neuromuscular reeducation to address functional use of RUE for pouring, carrying, washing/rinsing dishes.  Worked on handwriting - addressing grip on  utensil.  Patient with overemphasis on third digit - crossing over index and dominating movement pattern.  On tripod gripper - once fingers placed in three jaw chuck position - able to copy letters of alphabet.                  OT Education - 02/12/21 1739    Education Details grip on writing utensil    Person(s) Educated Patient    Methods Explanation;Demonstration;Tactile cues;Verbal cues    Comprehension Need further instruction            OT Short Term Goals - 02/12/21 1742      OT SHORT TERM GOAL #1   Title Patient will complete a home exercise program designed to improve RUE grip strength    Time 4    Period Weeks    Status On-going    Target Date 03/02/21      OT SHORT TERM GOAL #2   Title Patient will demonstrate effective adaptive grip on pen to write her name.    Time 4    Period Weeks    Status On-going             OT Long Term Goals - 02/12/21  1720      OT LONG TERM GOAL #1   Title Patient will complete home activities program to improve functional use of RUE    Time 8    Period Weeks    Status Achieved      OT LONG TERM GOAL #2   Title Patient will demonstrate improved score on box and blocks test by 5 blocks    Baseline 22    Time 8    Period Weeks    Status Achieved   30     OT LONG TERM GOAL #3   Title Patient will complete an updated HEP designed to improve coordination and general upper body strength    Time 8    Period Weeks    Status On-going      OT LONG TERM GOAL #4   Title Patient will demonstrate at least 3 lb increase in right grip strength to improve functional grasp    Baseline 15    Time 8    Period Weeks    Status Achieved   19 lb     OT LONG TERM GOAL #5   Title Patient will report no increase in pain after 5 min of right hand exercise or functional use.    Time 8    Period Weeks    Status Achieved                 Plan - 02/12/21 1741    Clinical Impression Statement Pt progressing towards goals. Pt  showing increased functional use of RUE    OT Occupational Profile and History Detailed Assessment- Review of Records and additional review of physical, cognitive, psychosocial history related to current functional performance    Occupational performance deficits (Please refer to evaluation for details): ADL's;IADL's    Body Structure / Function / Physical Skills ADL;Coordination;GMC;UE functional use;Sensation;Decreased knowledge of use of DME;Flexibility;IADL;Pain;Strength;Proprioception;FMC;Dexterity;Tone;ROM    Rehab Potential Fair    Clinical Decision Making Several treatment options, min-mod task modification necessary    Comorbidities Affecting Occupational Performance: May have comorbidities impacting occupational performance    Modification or Assistance to Complete Evaluation  Min-Moderate modification of tasks or assist with assess necessary to complete eval    OT Frequency 1x / week    OT Duration 8 weeks    OT Treatment/Interventions Self-care/ADL training;Electrical Stimulation;Therapeutic exercise;Patient/family education;Splinting;Neuromuscular education;Moist Heat;Functional Development worker, community;Therapeutic activities;Manual Therapy;DME and/or AE instruction;Contrast Bath;Ultrasound;Cryotherapy;Fluidtherapy    Plan hand shaping, question functional splint or buddy strapping - needs to attend to hand movement, decrease compensatory strategies    Consulted and Agree with Plan of Care Patient           Patient will benefit from skilled therapeutic intervention in order to improve the following deficits and impairments:   Body Structure / Function / Physical Skills: ADL,Coordination,GMC,UE functional use,Sensation,Decreased knowledge of use of DME,Flexibility,IADL,Pain,Strength,Proprioception,FMC,Dexterity,Tone,ROM       Visit Diagnosis: Apraxia  Pain in right hand  Other disturbances of skin sensation  Unsteadiness on feet    Problem List Patient Active Problem List    Diagnosis Date Noted  . Dyslipidemia   . Right hemiparesis (HCC)   . Slow transit constipation   . History of hypertension   . Dysphagia, post-stroke   . Acute blood loss anemia   . Controlled type 2 diabetes mellitus with complication, without long-term current use of insulin (HCC)   . Stenosis of right internal carotid artery   . Carotid stenosis, left, symptomatic s/p L ICA stent 07/11/2020  .  Carotid stenosis, asymptomatic, right 07/11/2020  . Essential hypertension 07/11/2020  . Hyperlipidemia LDL goal <70 07/11/2020  . Dysphagia due to recent stroke 07/11/2020  . Prediabetes 07/11/2020  . Middle cerebral artery embolism, left 07/04/2020  . Acute ischemic left ICA stroke (HCC) d/t LAA R ICA 07/03/2020  . Numbness 06/27/2020    Collier Salina, OTR/L 02/12/2021, 5:43 PM  [facility] [address] [place], [place], [address] Phone: [phone]   Fax:  [phone]  Name: Sharon Gilbert MRN: [id number] Date of Birth: Mar 14, 1941

## 2021-02-12 NOTE — Therapy (Signed)
[facility] [address] [place], [place], [address] Phone: [phone]   Fax:  [phone]  Speech Language Pathology Treatment  Patient Details  Name: Sharon Gilbert MRN: [id number] Date of Birth: 1941/07/17 Referring Provider (SLP): Linus Galas, NP   Encounter Date: 02/12/2021   End of Session - 02/12/21 1806    Visit Number 5    Number of Visits 17    Date for SLP Re-Evaluation 04/16/21    Authorization Type [employer]    SLP Start Time 1745    SLP Stop Time  1830    SLP Time Calculation (min) 45 min    Activity Tolerance Patient tolerated treatment well           Past Medical History:  Diagnosis Date  . Cataract   . Diabetes mellitus without complication [facility])     Past Surgical History:  Procedure Laterality Date  . IR CT HEAD LTD  07/04/2020  . IR INTRAVSC STENT CERV CAROTID W/O EMB-PROT MOD SED INC ANGIO  07/04/2020  . IR PERCUTANEOUS ART THROMBECTOMY/INFUSION INTRACRANIAL INC DIAG ANGIO  07/04/2020  . IR RADIOLOGIST EVAL & MGMT  10/01/2020  . IR RADIOLOGIST EVAL & MGMT  02/07/2021  . RADIOLOGY WITH ANESTHESIA N/A 07/04/2020   Procedure: IR WITH ANESTHESIA;  Surgeon: Radiologist, Medication, MD;  Location: [facility] OR;  Service: Radiology;  Laterality: N/A;    There were no vitals filed for this visit.   Subjective Assessment - 02/12/21 1747    Subjective "good"    Currently in Pain? No/denies                 ADULT SLP TREATMENT - 02/12/21 1747      General Information   Behavior/Cognition Alert;Cooperative;Pleasant mood;Impulsive;Requires cueing      Treatment Provided   Treatment provided Cognitive-Linquistic      Cognitive-Linquistic Treatment   Treatment focused on Apraxia;Aphasia;Patient/family/caregiver education    Skilled Treatment Pt did not complete HEP as instructed this session due to recent holiday. Increased receptive language deficits noted this session, in which pt  benefited from visual cues and written communication to aid comprehension. SLP targeted answering close-ended questions, in which usual apraxic errors exhibited. Max verbal and visual cues required to correct apraxic errors due to significant inconsistency. Pt benefited from occasional semantic cues for naming in categories. Pt noted to spell answers x2 when she was unable to verbally name. SLP trialed writing names of items in category, with improved naming exhibited with written cue.      Assessment / Recommendations / Plan   Plan Continue with current plan of care      Progression Toward Goals   Progression toward goals Progressing toward goals            SLP Education - 02/12/21 1823    Education Details compensations, slow rate    Person(s) Educated Patient    Methods Explanation;Demonstration;Handout;Verbal cues    Comprehension Verbalized understanding;Returned demonstration;Need further instruction;Verbal cues required            SLP Short Term Goals - 02/12/21 1815      SLP SHORT TERM GOAL #1   Title Pt will complete HEP with min A over 3 sessions    Baseline 02-05-21    Time 1    Period Weeks   or 9 visits for all STGs   Status On-going      SLP SHORT TERM GOAL #2   Title Pt will use mulitmodal communication (  gesture, draw, write 1st letter etc) to augment verbal expression with usual min A over 2 sessions    Baseline 01-29-21 (spell), 02-12-21 (spell)    Time --    Period --    Status Achieved      SLP SHORT TERM GOAL #3   Title Pt will verbally ID and correct speech errors in paragraph level reading or picture description tasks with min A over 2 sessions    Time 1    Period Weeks    Status On-going      SLP SHORT TERM GOAL #4   Title Pt will demonstrate speech compensations in simple 5 minute conversation with usual min A over 2 sessions    Time 1    Period Weeks    Status On-going      SLP SHORT TERM GOAL #5   Title Pt will identify current communication  effectiveness via QOL scale in first 2 sessions    Baseline CES: 19 & Comm SF: 13    Status Achieved            SLP Long Term Goals - 02/12/21 1815      SLP LONG TERM GOAL #1   Title Pt will use mulitmodal communication (gesture, draw, write 1st letter etc) to augment verbal expression to meet needs at home with rare min A from family.    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 HEP not completed for this session. SLP reviewed compensations to improve apraxic speech, including slow rate and over-ennunication. SLP targeted open ended questions to address word finding and use of compensations, with usual apraxic errors noted. Max verbal and visual cues required to correct apraxic errors. Occasional semantic cues required for naming. Pt benefited from frequent cues to slow rate to reduce apraxic errors and dysnomia. Skilled ST intervention is warranted to address significant apraxia and aphasia impacting pt's ability to effectively communicate with familiar and unfamiliar listeners and to increase QOL.    Speech Therapy Frequency 2x / week    Duration 8 weeks   or 17 total visits   Treatment/Interventions Compensatory strategies;Functional tasks;Patient/family education;Cueing hierarchy;Environmental controls;Multimodal communcation approach;Compensatory techniques;Internal/external aids;SLP instruction and  feedback;Language facilitation    Potential to Achieve Goals Fair    Potential Considerations Severity of impairments    SLP Home Exercise Plan provided    Consulted and Agree with Plan of Care Patient           Patient will benefit from skilled therapeutic intervention in order to improve the following deficits and impairments:   Verbal apraxia  Aphasia    Problem List Patient Active Problem List   Diagnosis Date Noted  . Dyslipidemia   .  Right hemiparesis (HCC)   . Slow transit constipation   . History of hypertension   . Dysphagia, post-stroke   . Acute blood loss anemia   . Controlled type 2 diabetes mellitus with complication, without long-term current use of insulin (HCC)   . Stenosis of right internal carotid artery   . Carotid stenosis, left, symptomatic s/p L ICA stent 07/11/2020  . Carotid stenosis, asymptomatic, right 07/11/2020  . Essential hypertension 07/11/2020  . Hyperlipidemia LDL goal <70 07/11/2020  . Dysphagia due to recent stroke 07/11/2020  . Prediabetes 07/11/2020  . Middle cerebral artery embolism, left 07/04/2020  . Acute ischemic left ICA stroke (HCC) d/t LAA R ICA 07/03/2020  . Numbness 06/27/2020    Janann Colonel, MA CCC-SLP 02/12/2021, 6:34 PM  [facility] [address] [place], [place], [address] Phone: [phone]   Fax:  [phone]   Name: PANHIA KARL MRN: [id number] Date of Birth: [birth year]

## 2021-02-13 DIAGNOSIS — H524 Presbyopia: Secondary | ICD-10-CM | POA: Diagnosis not present

## 2021-02-13 DIAGNOSIS — H40022 Open angle with borderline findings, high risk, left eye: Secondary | ICD-10-CM | POA: Diagnosis not present

## 2021-02-13 DIAGNOSIS — H401111 Primary open-angle glaucoma, right eye, mild stage: Secondary | ICD-10-CM | POA: Diagnosis not present

## 2021-02-14 ENCOUNTER — Encounter: Payer: Medicare HMO | Admitting: Occupational Therapy

## 2021-02-19 ENCOUNTER — Ambulatory Visit: Payer: Medicare HMO

## 2021-02-19 ENCOUNTER — Ambulatory Visit: Payer: Medicare HMO | Admitting: Occupational Therapy

## 2021-02-19 ENCOUNTER — Other Ambulatory Visit: Payer: Self-pay

## 2021-02-19 ENCOUNTER — Encounter: Payer: Medicare HMO | Admitting: Occupational Therapy

## 2021-02-19 ENCOUNTER — Encounter: Payer: Self-pay | Admitting: Occupational Therapy

## 2021-02-19 DIAGNOSIS — R208 Other disturbances of skin sensation: Secondary | ICD-10-CM | POA: Diagnosis not present

## 2021-02-19 DIAGNOSIS — M79641 Pain in right hand: Secondary | ICD-10-CM

## 2021-02-19 DIAGNOSIS — I69351 Hemiplegia and hemiparesis following cerebral infarction affecting right dominant side: Secondary | ICD-10-CM | POA: Diagnosis not present

## 2021-02-19 DIAGNOSIS — R482 Apraxia: Secondary | ICD-10-CM | POA: Diagnosis not present

## 2021-02-19 DIAGNOSIS — R2681 Unsteadiness on feet: Secondary | ICD-10-CM | POA: Diagnosis not present

## 2021-02-19 DIAGNOSIS — R4701 Aphasia: Secondary | ICD-10-CM | POA: Diagnosis not present

## 2021-02-19 NOTE — Therapy (Signed)
[facility] [address] [place], [place], [address] Phone: [phone]   Fax:  [phone]  Occupational Therapy Treatment  Patient Details  Name: Sharon Gilbert MRN: [id number] Date of Birth: [birth year] Referring Provider (OT): Linus Galas, NP   Encounter Date: 02/19/2021   OT End of Session - 02/19/21 1853    Visit Number 6    Number of Visits 9    Date for OT Re-Evaluation 04/01/21    Authorization Type [employer] Medicare    Progress Note Due on Visit 10    OT Start Time 1700    OT Stop Time 1745    OT Time Calculation (min) 45 min    Activity Tolerance Patient tolerated treatment well    Behavior During Therapy [facility] for tasks assessed/performed           Past Medical History:  Diagnosis Date  . Cataract   . Diabetes mellitus without complication [facility])     Past Surgical History:  Procedure Laterality Date  . IR CT HEAD LTD  07/04/2020  . IR INTRAVSC STENT CERV CAROTID W/O EMB-PROT MOD SED INC ANGIO  07/04/2020  . IR PERCUTANEOUS ART THROMBECTOMY/INFUSION INTRACRANIAL INC DIAG ANGIO  07/04/2020  . IR RADIOLOGIST EVAL & MGMT  10/01/2020  . IR RADIOLOGIST EVAL & MGMT  02/07/2021  . RADIOLOGY WITH ANESTHESIA N/A 07/04/2020   Procedure: IR WITH ANESTHESIA;  Surgeon: Radiologist, Medication, MD;  Location: [facility] OR;  Service: Radiology;  Laterality: N/A;    There were no vitals filed for this visit.   Subjective Assessment - 02/19/21 1705    Subjective  Much better    Currently in Pain? Yes    Pain Score 1     Pain Location Hand    Pain Orientation Right    Pain Descriptors / Indicators Aching    Pain Type Chronic pain    Pain Onset More than a month ago    Pain Frequency Intermittent                        OT Treatments/Exercises (OP) - 02/19/21 0001      Neurological Re-education Exercises   Other Exercises 1 Neuromuscular reeducation to address bimanual skills.  Inhibited 3-5 digits to allow  tip pinch facilitation with index and thumb.  Improved performance immediately following.  Continue to address handwriting - did best with hand over hand repetititve cueing.      RUE Fluidotherapy   Number Minutes Fluidotherapy 10 Minutes    RUE Fluidotherapy Location Hand;Wrist;Forearm    Comments decrease stiffness, ache                    OT Short Term Goals - 02/19/21 1855      OT SHORT TERM GOAL #1   Title Patient will complete a home exercise program designed to improve RUE grip strength    Time 4    Period Weeks    Status On-going    Target Date 03/02/21      OT SHORT TERM GOAL #2   Title Patient will demonstrate effective adaptive grip on pen to write her name.    Time 4    Period Weeks    Status On-going             OT Long Term Goals - 02/19/21 1855      OT LONG TERM GOAL #1   Title Patient will complete home activities program to  improve functional use of RUE    Time 8    Period Weeks    Status Achieved      OT LONG TERM GOAL #2   Title Patient will demonstrate improved score on box and blocks test by 5 blocks    Baseline 22    Time 8    Period Weeks    Status Achieved   30     OT LONG TERM GOAL #3   Title Patient will complete an updated HEP designed to improve coordination and general upper body strength    Time 8    Period Weeks    Status On-going      OT LONG TERM GOAL #4   Title Patient will demonstrate at least 3 lb increase in right grip strength to improve functional grasp    Baseline 15    Time 8    Period Weeks    Status Achieved   19 lb     OT LONG TERM GOAL #5   Title Patient will report no increase in pain after 5 min of right hand exercise or functional use.    Time 8    Period Weeks    Status Achieved                 Plan - 02/19/21 1854    Clinical Impression Statement Pt showing increased functional use of RUE - still limited by decreased sensation and apraxia    OT Occupational Profile and History  Detailed Assessment- Review of Records and additional review of physical, cognitive, psychosocial history related to current functional performance    Occupational performance deficits (Please refer to evaluation for details): ADL's;IADL's    Body Structure / Function / Physical Skills ADL;Coordination;GMC;UE functional use;Sensation;Decreased knowledge of use of DME;Flexibility;IADL;Pain;Strength;Proprioception;FMC;Dexterity;Tone;ROM    Rehab Potential Fair    Clinical Decision Making Several treatment options, min-mod task modification necessary    Comorbidities Affecting Occupational Performance: May have comorbidities impacting occupational performance    Modification or Assistance to Complete Evaluation  Min-Moderate modification of tasks or assist with assess necessary to complete eval    OT Frequency 1x / week    OT Duration 8 weeks    OT Treatment/Interventions Self-care/ADL training;Electrical Stimulation;Therapeutic exercise;Patient/family education;Splinting;Neuromuscular education;Moist Heat;Functional Development worker, community;Therapeutic activities;Manual Therapy;DME and/or AE instruction;Contrast Bath;Ultrasound;Cryotherapy;Fluidtherapy    Plan hand shaping, question functional splint or buddy strapping - needs to attend to hand movement, decrease compensatory strategies    Consulted and Agree with Plan of Care Patient           Patient will benefit from skilled therapeutic intervention in order to improve the following deficits and impairments:   Body Structure / Function / Physical Skills: ADL,Coordination,GMC,UE functional use,Sensation,Decreased knowledge of use of DME,Flexibility,IADL,Pain,Strength,Proprioception,FMC,Dexterity,Tone,ROM       Visit Diagnosis: Apraxia  Pain in right hand  Other disturbances of skin sensation  Hemiplegia and hemiparesis following cerebral infarction affecting right dominant side [facility])    Problem List Patient Active Problem List    Diagnosis Date Noted  . Dyslipidemia   . Right hemiparesis (HCC)   . Slow transit constipation   . History of hypertension   . Dysphagia, post-stroke   . Acute blood loss anemia   . Controlled type 2 diabetes mellitus with complication, without long-term current use of insulin (HCC)   . Stenosis of right internal carotid artery   . Carotid stenosis, left, symptomatic s/p L ICA stent 07/11/2020  . Carotid stenosis, asymptomatic, right 07/11/2020  . Essential  hypertension 07/11/2020  . Hyperlipidemia LDL goal <70 07/11/2020  . Dysphagia due to recent stroke 07/11/2020  . Prediabetes 07/11/2020  . Middle cerebral artery embolism, left 07/04/2020  . Acute ischemic left ICA stroke (HCC) d/t LAA R ICA 07/03/2020  . Numbness 06/27/2020    Collier Salina 02/19/2021, 6:55 PM  [facility] [address] [place], [place], [address] Phone: [phone]   Fax:  [phone]  Name: CASEY MAXFIELD MRN: [id number] Date of Birth: [birth year]

## 2021-02-19 NOTE — Therapy (Signed)
[facility] [address] [place], [place], [address] Phone: [phone]   Fax:  [phone]  Speech Language Pathology Treatment  Patient Details  Name: Sharon Gilbert MRN: [id number] Date of Birth: Jul 30, 1941 Referring Provider (SLP): Linus Galas, NP   Encounter Date: 02/19/2021   End of Session - 02/19/21 1649    Visit Number 6    Number of Visits 17    Date for SLP Re-Evaluation 04/16/21    Authorization Type [employer] Medicare    SLP Start Time 1615    SLP Stop Time  1700    SLP Time Calculation (min) 45 min    Activity Tolerance Patient tolerated treatment well           Past Medical History:  Diagnosis Date  . Cataract   . Diabetes mellitus without complication [facility])     Past Surgical History:  Procedure Laterality Date  . IR CT HEAD LTD  07/04/2020  . IR INTRAVSC STENT CERV CAROTID W/O EMB-PROT MOD SED INC ANGIO  07/04/2020  . IR PERCUTANEOUS ART THROMBECTOMY/INFUSION INTRACRANIAL INC DIAG ANGIO  07/04/2020  . IR RADIOLOGIST EVAL & MGMT  10/01/2020  . IR RADIOLOGIST EVAL & MGMT  02/07/2021  . RADIOLOGY WITH ANESTHESIA N/A 07/04/2020   Procedure: IR WITH ANESTHESIA;  Surgeon: Radiologist, Medication, MD;  [facility]: [facility] OR;  Service: Radiology;  Laterality: N/A;    There were no vitals filed for this visit.   Subjective Assessment - 02/19/21 1616    Subjective "tired"    Currently in Pain? No/denies                 ADULT SLP TREATMENT - 02/19/21 0001      General Information   Behavior/Cognition Alert;Cooperative;Confused;Impulsive;Requires cueing      Treatment Provided   Treatment provided Cognitive-Linquistic      Cognitive-Linquistic Treatment   Treatment focused on Apraxia;Aphasia;Patient/family/caregiver education    Skilled Treatment SLP reviewed naming HEP, in which pt able to ID correct categories without assistance. Pt able to read answers with 81% accuracy due to apraxic errors.  Given min to max verbal and visual cues, pt able to correct ~75% of errors. SLP engaged patient in simple conversation, in which pt did demonstrate improved speech intellgibilty to be approximately ~85% intelligible. Occasional verbal cues required to slow rate. Pt indicated her friend mentioned speech was better. Some questionable comprehension exhibited, which may be related to [facility] or receptive language. Pt benefited from occasional repetition of questions.      Assessment / Recommendations / Plan   Plan Continue with current plan of care      Progression Toward Goals   Progression toward goals Progressing toward goals            SLP Education - 02/19/21 1651    Education Details HEP, slow rate, pausing to process    Person(s) Educated Patient    Methods Explanation;Demonstration;Handout    Comprehension Verbalized understanding;Returned demonstration;Need further instruction            SLP Short Term Goals - 02/19/21 1650      SLP SHORT TERM GOAL #1   Title Pt will complete HEP with min A over 3 sessions    Baseline 02-05-21, 02-19-21    Time 1    Period Weeks   or 9 visits for all STGs   Status On-going      SLP SHORT TERM GOAL #2   Title Pt will use mulitmodal communication (gesture,  draw, write 1st letter etc) to augment verbal expression with usual min A over 2 sessions    Baseline 01-29-21 (spell), 02-12-21 (spell)    Status Achieved      SLP SHORT TERM GOAL #3   Title Pt will verbally ID and correct speech errors in paragraph level reading or picture description tasks with min A over 2 sessions    Time 1    Period Weeks    Status On-going      SLP SHORT TERM GOAL #4   Title Pt will demonstrate speech compensations in simple 5 minute conversation with usual min A over 2 sessions    Baseline 02-19-21    Time 1    Period Weeks    Status On-going      SLP SHORT TERM GOAL #5   Title Pt will identify current communication effectiveness via QOL scale in first 2 sessions     Baseline CES: 19 & Comm SF: 13    Status Achieved            SLP Long Term Goals - 02/19/21 1650      SLP LONG TERM GOAL #1   Title Pt will use mulitmodal communication (gesture, draw, write 1st letter etc) to augment verbal expression to meet needs at home with rare min A from family.    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 SLP reviewed compensations to improve apraxic speech, including slow rate and over-ennunication. Pt able to accurately read words with 81% accuracy. Min to max verbal and visual cues required to correct apraxic errors, which was occasionally ineffective. SLP targeted open ended questions to assess speech in conversation, in which pt was ~85% intellgibile. Pt benefited from occasional cues to slow rate to reduce apraxic errors and dysnomia. Skilled ST intervention is warranted to address significant apraxia and aphasia impacting pt's ability to effectively communicate with familiar and unfamiliar listeners and to increase QOL.    Speech Therapy Frequency 2x / week   pt only scheduled 1x/week to finances   Duration 8 weeks   or 17 total visits   Treatment/Interventions Compensatory strategies;Functional tasks;Patient/family education;Cueing hierarchy;Environmental controls;Multimodal communcation approach;Compensatory techniques;Internal/external aids;SLP instruction and  feedback;Language facilitation    Potential to Achieve Goals Fair    Potential Considerations Severity of impairments;Cooperation/participation level    SLP Home Exercise Plan provided    Consulted and Agree with Plan of Care Patient           Patient will benefit from skilled therapeutic intervention in order to improve the following deficits and impairments:   Verbal apraxia  Aphasia    Problem List Patient Active Problem List   Diagnosis Date Noted  . Dyslipidemia   .  Right hemiparesis (HCC)   . Slow transit constipation   . History of hypertension   . Dysphagia, post-stroke   . Acute blood loss anemia   . Controlled type 2 diabetes mellitus with complication, without long-term current use of insulin (HCC)   . Stenosis of right internal carotid artery   . Carotid stenosis, left, symptomatic s/p L ICA stent 07/11/2020  . Carotid stenosis, asymptomatic, right 07/11/2020  . Essential hypertension 07/11/2020  . Hyperlipidemia LDL goal <70 07/11/2020  . Dysphagia due to recent stroke 07/11/2020  . Prediabetes 07/11/2020  . Middle cerebral artery embolism, left 07/04/2020  . Acute ischemic left ICA stroke (HCC) d/t LAA R ICA 07/03/2020  . Numbness 06/27/2020    Janann Colonel, MA CCC-SLP 02/19/2021, 6:00 PM  [facility] [address] [place], [place], [address] Phone: [phone]   Fax:  [phone]   Name: SATIN BOAL MRN: [id number] Date of Birth: [birth year]

## 2021-02-20 DIAGNOSIS — H52209 Unspecified astigmatism, unspecified eye: Secondary | ICD-10-CM | POA: Diagnosis not present

## 2021-02-20 DIAGNOSIS — H524 Presbyopia: Secondary | ICD-10-CM | POA: Diagnosis not present

## 2021-02-20 DIAGNOSIS — H5213 Myopia, bilateral: Secondary | ICD-10-CM | POA: Diagnosis not present

## 2021-02-26 ENCOUNTER — Other Ambulatory Visit (HOSPITAL_COMMUNITY): Payer: Self-pay | Admitting: Interventional Radiology

## 2021-02-26 ENCOUNTER — Ambulatory Visit: Payer: Medicare HMO | Admitting: Occupational Therapy

## 2021-02-26 ENCOUNTER — Encounter: Payer: Self-pay | Admitting: Occupational Therapy

## 2021-02-26 ENCOUNTER — Encounter: Payer: Medicare HMO | Admitting: Occupational Therapy

## 2021-02-26 ENCOUNTER — Ambulatory Visit: Payer: Medicare HMO

## 2021-02-26 ENCOUNTER — Telehealth (HOSPITAL_COMMUNITY): Payer: Self-pay | Admitting: Radiology

## 2021-02-26 ENCOUNTER — Other Ambulatory Visit: Payer: Self-pay

## 2021-02-26 DIAGNOSIS — M79641 Pain in right hand: Secondary | ICD-10-CM

## 2021-02-26 DIAGNOSIS — R208 Other disturbances of skin sensation: Secondary | ICD-10-CM | POA: Diagnosis not present

## 2021-02-26 DIAGNOSIS — R4701 Aphasia: Secondary | ICD-10-CM | POA: Diagnosis not present

## 2021-02-26 DIAGNOSIS — R2681 Unsteadiness on feet: Secondary | ICD-10-CM | POA: Diagnosis not present

## 2021-02-26 DIAGNOSIS — R482 Apraxia: Secondary | ICD-10-CM

## 2021-02-26 DIAGNOSIS — I69351 Hemiplegia and hemiparesis following cerebral infarction affecting right dominant side: Secondary | ICD-10-CM

## 2021-02-26 DIAGNOSIS — I771 Stricture of artery: Secondary | ICD-10-CM

## 2021-02-26 NOTE — Telephone Encounter (Signed)
Tried to call pt, someone picked up phone but did not respond. JM

## 2021-02-26 NOTE — Therapy (Signed)
[facility] [address] [place], [place], [address] Phone: [phone]   Fax:  [phone]  Speech Language Pathology Treatment  Patient Details  Name: Sharon Gilbert MRN: [id number] Date of Birth: 06/03/1941 Referring Provider (SLP): Linus Galas, NP   Encounter Date: 02/26/2021   End of Session - 02/26/21 1722    Visit Number 7    Number of Visits 17    Date for SLP Re-Evaluation 04/16/21    Authorization Type [employer]    SLP Start Time 1740    SLP Stop Time  1829    SLP Time Calculation (min) 49 min    Activity Tolerance Patient tolerated treatment well           Past Medical History:  Diagnosis Date  . Cataract   . Diabetes mellitus without complication [facility])     Past Surgical History:  Procedure Laterality Date  . IR CT HEAD LTD  07/04/2020  . IR INTRAVSC STENT CERV CAROTID W/O EMB-PROT MOD SED INC ANGIO  07/04/2020  . IR PERCUTANEOUS ART THROMBECTOMY/INFUSION INTRACRANIAL INC DIAG ANGIO  07/04/2020  . IR RADIOLOGIST EVAL & MGMT  10/01/2020  . IR RADIOLOGIST EVAL & MGMT  02/07/2021  . RADIOLOGY WITH ANESTHESIA N/A 07/04/2020   Procedure: IR WITH ANESTHESIA;  Surgeon: Radiologist, Medication, MD;  Location: [facility] OR;  Service: Radiology;  Laterality: N/A;    There were no vitals filed for this visit.   Subjective Assessment - 02/26/21 1725    Subjective "it's been a little better"    Currently in Pain? No/denies                 ADULT SLP TREATMENT - 02/26/21 1722      General Information   Behavior/Cognition Alert;Cooperative;Confused;Impulsive;Requires cueing      Treatment Provided   Treatment provided Cognitive-Linquistic      Cognitive-Linquistic Treatment   Treatment focused on Apraxia;Aphasia;Patient/family/caregiver education    Skilled Treatment Pt reported confusion re: missing earlier ST appt time. Pt indicated her speaking is better, but pt required written prompt to  correctly verbalize "my speaking is better." SLP reviewed HEP, in which pt able to description verbalize prompt and corresponding item with 75% accuracy with occasional self-corrections. Pt read off fill-in blank sentences with 65% accuracy. Occasional cues required to correct apraxic errors and errored sentences. Intermittent reduced comprehension exhibited requiring SLP assistance to improve understanding.      Assessment / Recommendations / Plan   Plan Continue with current plan of care      Progression Toward Goals   Progression toward goals Progressing toward goals            SLP Education - 02/26/21 1725    Education Details HEP, compensations, funtional practice    Person(s) Educated Patient    Methods Explanation;Demonstration;Handout    Comprehension Verbalized understanding;Returned demonstration;Need further instruction            SLP Short Term Goals - 02/26/21 1723      SLP SHORT TERM GOAL #1   Title Pt will complete HEP with min A over 3 sessions    Baseline 02-05-21, 02-19-21, 02-26-21    Time --    Period --   or 9 visits for all STGs   Status Achieved      SLP SHORT TERM GOAL #2   Title Pt will use mulitmodal communication (gesture, draw, write 1st letter etc) to augment verbal expression with usual min A over 2 sessions  Baseline 01-29-21 (spell), 02-12-21 (spell)    Status Achieved      SLP SHORT TERM GOAL #3   Title Pt will verbally ID and correct speech errors in paragraph level reading or picture description tasks with min A over 2 sessions    Time 1    Period Weeks    Status On-going      SLP SHORT TERM GOAL #4   Title Pt will demonstrate speech compensations in simple 5 minute conversation with usual min A over 2 sessions    Baseline 02-19-21    Time 1    Period Weeks    Status On-going      SLP SHORT TERM GOAL #5   Title Pt will identify current communication effectiveness via QOL scale in first 2 sessions    Baseline CES: 19 & Comm SF: 13     Status Achieved            SLP Long Term Goals - 02/26/21 1723      SLP LONG TERM GOAL #1   Title Pt will use mulitmodal communication (gesture, draw, write 1st letter etc) to augment verbal expression to meet needs at home with rare min A from family.    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 SLP targeted carryover of compensations to improve apraxic speech, including slow rate and over-ennunication. Occasional fading to rare verbal and visual cues required to correct apraxic errors. Pt benefited from occasional cues to slow rate to reduce apraxic errors and dysnomia. See "skilled treatment" for additional details of today's session. Skilled ST intervention is warranted to address significant apraxia and aphasia impacting pt's ability to effectively communicate with familiar and unfamiliar listeners and to increase QOL.    Speech Therapy Frequency 2x / week   pt only scheduled 1x/week per pt preference   Duration 8 weeks   or 17 total visits   Treatment/Interventions Compensatory strategies;Functional tasks;Patient/family education;Cueing hierarchy;Environmental controls;Multimodal communcation approach;Compensatory techniques;Internal/external aids;SLP instruction and feedback;Language facilitation    Potential to Achieve Goals Fair    Potential Considerations Severity of  impairments;Cooperation/participation level    SLP Home Exercise Plan provided    Consulted and Agree with Plan of Care Patient           Patient will benefit from skilled therapeutic intervention in order to improve the following deficits and impairments:   Verbal apraxia  Aphasia    Problem List Patient Active Problem List   Diagnosis Date Noted  . Dyslipidemia   . Right hemiparesis (HCC)   . Slow transit constipation   . History of hypertension   . Dysphagia, post-stroke   . Acute blood loss anemia   . Controlled type  2 diabetes mellitus with complication, without long-term current use of insulin (HCC)   . Stenosis of right internal carotid artery   . Carotid stenosis, left, symptomatic s/p L ICA stent 07/11/2020  . Carotid stenosis, asymptomatic, right 07/11/2020  . Essential hypertension 07/11/2020  . Hyperlipidemia LDL goal <70 07/11/2020  . Dysphagia due to recent stroke 07/11/2020  . Prediabetes 07/11/2020  . Middle cerebral artery embolism, left 07/04/2020  . Acute ischemic left ICA stroke (HCC) d/t LAA R ICA 07/03/2020  . Numbness 06/27/2020    Janann Colonel, MA CCC-SLP 02/26/2021, 6:31 PM  [facility] [address] [place], [place], [address] Phone: [phone]   Fax:  [phone]   Name: Sharon Gilbert MRN: [id number] Date of Birth: Mar 23, 1941

## 2021-02-26 NOTE — Therapy (Signed)
[facility] [address] [place], [place], [address] Phone: [phone]   Fax:  [phone]  Occupational Therapy Treatment  Patient Details  Name: Sharon Gilbert MRN: [id number] Date of Birth: [birth year] Referring Provider (OT): Linus Galas, NP   Encounter Date: 02/26/2021   OT End of Session - 02/26/21 1750    Visit Number 7    Number of Visits 9    Date for OT Re-Evaluation 04/01/21    Authorization Type [employer] Medicare    Progress Note Due on Visit 10    OT Start Time 1700    OT Stop Time 1745    OT Time Calculation (min) 45 min    Activity Tolerance Patient tolerated treatment well    Behavior During Therapy [facility] for tasks assessed/performed           Past Medical History:  Diagnosis Date  . Cataract   . Diabetes mellitus without complication [facility])     Past Surgical History:  Procedure Laterality Date  . IR CT HEAD LTD  07/04/2020  . IR INTRAVSC STENT CERV CAROTID W/O EMB-PROT MOD SED INC ANGIO  07/04/2020  . IR PERCUTANEOUS ART THROMBECTOMY/INFUSION INTRACRANIAL INC DIAG ANGIO  07/04/2020  . IR RADIOLOGIST EVAL & MGMT  10/01/2020  . IR RADIOLOGIST EVAL & MGMT  02/07/2021  . RADIOLOGY WITH ANESTHESIA N/A 07/04/2020   Procedure: IR WITH ANESTHESIA;  Surgeon: Radiologist, Medication, MD;  Location: [facility] OR;  Service: Radiology;  Laterality: N/A;    There were no vitals filed for this visit.   Subjective Assessment - 02/26/21 1707    Subjective  Patient indicates she had a nice weekend    Currently in Pain? No/denies    Pain Score 0-No pain                        OT Treatments/Exercises (OP) - 02/26/21 0001      ADLs   Home Maintenance Patient using right hand in bimanual tasks to fold laundry - very natural movement pattern yet slightly delayed.    Writing Continuing to work on adapted grip for patient and reducing excessive over grip in right hand      Neurological Re-education Exercises    Other Exercises 1 Neuromuscular reeducation to specifically address grip and pinch strength with putty and weighted clothespins.  Cueing needed to reduce excessive tension in right shoulder.                    OT Short Term Goals - 02/26/21 1726      OT SHORT TERM GOAL #1   Title Patient will complete a home exercise program designed to improve RUE grip strength    Time 4    Status On-going    Target Date 03/02/21      OT SHORT TERM GOAL #2   Title Patient will demonstrate effective adaptive grip on pen to write her name.    Time 4    Period Weeks    Status Achieved             OT Long Term Goals - 02/26/21 1729      OT LONG TERM GOAL #1   Title Patient will complete home activities program to improve functional use of RUE    Time 8    Period Weeks    Status Achieved      OT LONG TERM GOAL #2   Title Patient will demonstrate improved  score on box and blocks test by 5 blocks    Baseline 22    Time 8    Period Weeks    Status Achieved   30     OT LONG TERM GOAL #3   Title Patient will complete an updated HEP designed to improve coordination and general upper body strength    Time 8    Period Weeks    Status Achieved      OT LONG TERM GOAL #4   Title Patient will demonstrate at least 3 lb increase in right grip strength to improve functional grasp    Baseline 15    Time 8    Period Weeks    Status Achieved   19 lb     OT LONG TERM GOAL #5   Title Patient will report no increase in pain after 5 min of right hand exercise or functional use.    Time 8    Period Weeks    Status Achieved                 Plan - 02/26/21 1750    Clinical Impression Statement Pt showing increased functional use of RUE - still limited by decreased sensation and apraxia    OT Occupational Profile and History Detailed Assessment- Review of Records and additional review of physical, cognitive, psychosocial history related to current functional performance     Occupational performance deficits (Please refer to evaluation for details): ADL's;IADL's    Body Structure / Function / Physical Skills ADL;Coordination;GMC;UE functional use;Sensation;Decreased knowledge of use of DME;Flexibility;IADL;Pain;Strength;Proprioception;FMC;Dexterity;Tone;ROM    Rehab Potential Fair    Clinical Decision Making Several treatment options, min-mod task modification necessary    Comorbidities Affecting Occupational Performance: May have comorbidities impacting occupational performance    Modification or Assistance to Complete Evaluation  Min-Moderate modification of tasks or assist with assess necessary to complete eval    OT Frequency 1x / week    OT Duration 8 weeks    OT Treatment/Interventions Self-care/ADL training;Electrical Stimulation;Therapeutic exercise;Patient/family education;Splinting;Neuromuscular education;Moist Heat;Functional Development worker, community;Therapeutic activities;Manual Therapy;DME and/or AE instruction;Contrast Bath;Ultrasound;Cryotherapy;Fluidtherapy    Plan hand shaping, question functional splint or buddy strapping - needs to attend to hand movement, decrease compensatory strategies- remianing goals and discharge    Consulted and Agree with Plan of Care Patient           Patient will benefit from skilled therapeutic intervention in order to improve the following deficits and impairments:   Body Structure / Function / Physical Skills: ADL,Coordination,GMC,UE functional use,Sensation,Decreased knowledge of use of DME,Flexibility,IADL,Pain,Strength,Proprioception,FMC,Dexterity,Tone,ROM       Visit Diagnosis: Apraxia  Pain in right hand  Other disturbances of skin sensation  Hemiplegia and hemiparesis following cerebral infarction affecting right dominant side [facility])    Problem List Patient Active Problem List   Diagnosis Date Noted  . Dyslipidemia   . Right hemiparesis (HCC)   . Slow transit constipation   . History of hypertension    . Dysphagia, post-stroke   . Acute blood loss anemia   . Controlled type 2 diabetes mellitus with complication, without long-term current use of insulin (HCC)   . Stenosis of right internal carotid artery   . Carotid stenosis, left, symptomatic s/p L ICA stent 07/11/2020  . Carotid stenosis, asymptomatic, right 07/11/2020  . Essential hypertension 07/11/2020  . Hyperlipidemia LDL goal <70 07/11/2020  . Dysphagia due to recent stroke 07/11/2020  . Prediabetes 07/11/2020  . Middle cerebral artery embolism, left 07/04/2020  . Acute ischemic  left ICA stroke (HCC) d/t LAA R ICA 07/03/2020  . Numbness 06/27/2020    Collier Salina, OTR/L 02/26/2021, 5:52 PM  [facility] [address] [place], [place], [address] Phone: [phone]   Fax:  [phone]  Name: SIMARA RHYNER MRN: [id number] Date of Birth: 07/30/1941

## 2021-02-28 ENCOUNTER — Encounter: Payer: Medicare HMO | Admitting: Occupational Therapy

## 2021-03-05 ENCOUNTER — Encounter: Payer: Medicare HMO | Admitting: Occupational Therapy

## 2021-03-07 ENCOUNTER — Ambulatory Visit: Payer: Medicare HMO | Admitting: Occupational Therapy

## 2021-03-07 ENCOUNTER — Ambulatory Visit: Payer: Medicare HMO

## 2021-03-07 ENCOUNTER — Other Ambulatory Visit: Payer: Self-pay

## 2021-03-07 ENCOUNTER — Encounter: Payer: Self-pay | Admitting: Occupational Therapy

## 2021-03-07 DIAGNOSIS — I69351 Hemiplegia and hemiparesis following cerebral infarction affecting right dominant side: Secondary | ICD-10-CM

## 2021-03-07 DIAGNOSIS — R4701 Aphasia: Secondary | ICD-10-CM | POA: Diagnosis not present

## 2021-03-07 DIAGNOSIS — M79641 Pain in right hand: Secondary | ICD-10-CM

## 2021-03-07 DIAGNOSIS — R482 Apraxia: Secondary | ICD-10-CM | POA: Diagnosis not present

## 2021-03-07 DIAGNOSIS — R208 Other disturbances of skin sensation: Secondary | ICD-10-CM | POA: Diagnosis not present

## 2021-03-07 NOTE — Therapy (Signed)
[facility] [address] [place], [place], [address] Phone: [phone]   Fax:  [phone]  Occupational Therapy Treatment and Discharge  Patient Details  Name: Sharon Gilbert MRN: [id number] Date of Birth: 1941/03/17 Referring Provider (OT): Deon Pilling, NP   Encounter Date: 03/07/2021   OT End of Session - 03/07/21 1754    Visit Number 8    Number of Visits 9    Date for OT Re-Evaluation 04/01/21    Authorization Type [employer] Medicare    OT Start Time 1700    OT Stop Time 1745    OT Time Calculation (min) 45 min    Activity Tolerance Patient tolerated treatment well    Behavior During Therapy [facility] for tasks assessed/performed           Past Medical History:  Diagnosis Date  . Cataract   . Diabetes mellitus without complication [facility])     Past Surgical History:  Procedure Laterality Date  . IR CT HEAD LTD  07/04/2020  . IR INTRAVSC STENT CERV CAROTID W/O EMB-PROT MOD SED INC ANGIO  07/04/2020  . IR PERCUTANEOUS ART THROMBECTOMY/INFUSION INTRACRANIAL INC DIAG ANGIO  07/04/2020  . IR RADIOLOGIST EVAL & MGMT  10/01/2020  . IR RADIOLOGIST EVAL & MGMT  02/07/2021  . RADIOLOGY WITH ANESTHESIA N/A 07/04/2020   Procedure: IR WITH ANESTHESIA;  Surgeon: Radiologist, Medication, MD;  Location: [facility];  Service: Radiology;  Laterality: N/A;    There were no vitals filed for this visit.   Subjective Assessment - 03/07/21 1701    Subjective  Everything's the same    Currently in Pain? No/denies    Pain Score 0-No pain                        OT Treatments/Exercises (OP) - 03/07/21 0001      Neurological Re-education Exercises   Other Exercises 1 Neuromuscular reeducation to address functional use of RUE.  Closing/opening velcro shoes, tying shoes with bimanual method.  Patient able to tie shoes multiple times with slightly increased time.  Worked on approaching and opening/closing scissors.  Patient had  extreme difficulty grasping scissors from table and orienting her hand to thumb and finger openings.  Patient did best with backward chaining - and hand over hand assistance initially.  Then patient able to replicate.  Patient gift wrapped a box with only minimal cueing.  Worked to open various shaped, sized containers using RUE with LUE as stabilizer.  Patient is clearly able to use RUE functionally with increased time and occasional assitance.  She has met all OT goals and is ready for discharge.                    OT Short Term Goals - 03/07/21 1746      OT SHORT TERM GOAL #1   Title Patient will complete a home exercise program designed to improve RUE grip strength    Time 4    Status Achieved    Target Date 03/02/21      OT SHORT TERM GOAL #2   Title Patient will demonstrate effective adaptive grip on pen to write her name.    Time 4    Period Weeks    Status Achieved             OT Long Term Goals - 03/07/21 1747      OT LONG TERM GOAL #1   Title Patient will  complete home activities program to improve functional use of RUE    Time 8    Period Weeks    Status Achieved      OT LONG TERM GOAL #2   Title Patient will demonstrate improved score on box and blocks test by 5 blocks    Baseline 22    Time 8    Period Weeks    Status Achieved   30     OT LONG TERM GOAL #3   Title Patient will complete an updated HEP designed to improve coordination and general upper body strength    Time 8    Period Weeks    Status Achieved      OT LONG TERM GOAL #4   Title Patient will demonstrate at least 3 lb increase in right grip strength to improve functional grasp    Baseline 15    Time 8    Period Weeks    Status Achieved   19 lb     OT LONG TERM GOAL #5   Title Patient will report no increase in pain after 5 min of right hand exercise or functional use.    Time 8    Period Weeks    Status Achieved                 Plan - 03/07/21 1754    Clinical  Impression Statement Pt has met OT goals and is ready for discharge    OT Occupational Profile and History Detailed Assessment- Review of Records and additional review of physical, cognitive, psychosocial history related to current functional performance    Occupational performance deficits (Please refer to evaluation for details): ADL's;IADL's    Body Structure / Function / Physical Skills ADL;Coordination;GMC;UE functional use;Sensation;Decreased knowledge of use of DME;Flexibility;IADL;Pain;Strength;Proprioception;FMC;Dexterity;Tone;ROM    Rehab Potential Fair    Clinical Decision Making Several treatment options, min-mod task modification necessary    Comorbidities Affecting Occupational Performance: May have comorbidities impacting occupational performance    Modification or Assistance to Complete Evaluation  Min-Moderate modification of tasks or assist with assess necessary to complete eval    OT Frequency 1x / week    OT Duration 8 weeks    OT Treatment/Interventions Self-care/ADL training;Electrical Stimulation;Therapeutic exercise;Patient/family education;Splinting;Neuromuscular education;Moist Heat;Functional Furniture conservator/restorer;Therapeutic activities;Manual Therapy;DME and/or AE instruction;Contrast Bath;Ultrasound;Cryotherapy;Fluidtherapy    Consulted and Agree with Plan of Care Patient           Patient will benefit from skilled therapeutic intervention in order to improve the following deficits and impairments:   Body Structure / Function / Physical Skills: ADL,Coordination,GMC,UE functional use,Sensation,Decreased knowledge of use of DME,Flexibility,IADL,Pain,Strength,Proprioception,FMC,Dexterity,Tone,ROM       Visit Diagnosis: Hemiplegia and hemiparesis following cerebral infarction affecting right dominant side (HCC)  Other disturbances of skin sensation  Apraxia  Pain in right hand    Problem List Patient Active Problem List   Diagnosis Date Noted  . Dyslipidemia    . Right hemiparesis ([place])   . Slow transit constipation   . History of hypertension   . Dysphagia, post-stroke   . Acute blood loss anemia   . Controlled type 2 diabetes mellitus with complication, without long-term current use of insulin ([place])   . Stenosis of right internal carotid artery   . Carotid stenosis, left, symptomatic s/p L ICA stent 07/11/2020  . Carotid stenosis, asymptomatic, right 07/11/2020  . Essential hypertension 07/11/2020  . Hyperlipidemia LDL goal <70 07/11/2020  . Dysphagia due to recent stroke 07/11/2020  . Prediabetes 07/11/2020  .  Middle cerebral artery embolism, left 07/04/2020  . Acute ischemic left ICA stroke ([place]) d/t LAA R ICA 07/03/2020  . Numbness 06/27/2020  OCCUPATIONAL THERAPY DISCHARGE SUMMARY  Visits from Start of Care: 8  Current functional level related to goals / functional outcomes: Patient has made significant improvement with strength, decreased pain, and functional use with dominant RUE   Remaining deficits: Decreased sensation, apraxia   Education / Equipment: HEP Plan: Patient agrees to discharge.  Patient goals were met. Patient is being discharged due to                                                     ?????      Mariah Milling  OTR/L 03/07/2021, 5:56 PM  [facility] [address] [place], [place], [address] Phone: [phone]   Fax:  [phone]  Name: Sharon Gilbert MRN: [id number] Date of Birth: [birth year]

## 2021-03-07 NOTE — Therapy (Signed)
[facility] [address] [place], [place], [address] Phone: [phone]   Fax:  [phone]  Speech Language Pathology Treatment  Patient Details  Name: Sharon Gilbert MRN: [id number] Date of Birth: May 22, 1941 Referring Provider (SLP): Linus Galas, NP   Encounter Date: 03/07/2021   End of Session - 03/07/21 1612    Visit Number 8    Number of Visits 17    Date for SLP Re-Evaluation 04/16/21    Authorization Type [employer]    SLP Start Time 1615    SLP Stop Time  1700    SLP Time Calculation (min) 45 min    Activity Tolerance Patient tolerated treatment well           Past Medical History:  Diagnosis Date  . Cataract   . Diabetes mellitus without complication [facility])     Past Surgical History:  Procedure Laterality Date  . IR CT HEAD LTD  07/04/2020  . IR INTRAVSC STENT CERV CAROTID W/O EMB-PROT MOD SED INC ANGIO  07/04/2020  . IR PERCUTANEOUS ART THROMBECTOMY/INFUSION INTRACRANIAL INC DIAG ANGIO  07/04/2020  . IR RADIOLOGIST EVAL & MGMT  10/01/2020  . IR RADIOLOGIST EVAL & MGMT  02/07/2021  . RADIOLOGY WITH ANESTHESIA N/A 07/04/2020   Procedure: IR WITH ANESTHESIA;  Surgeon: Radiologist, Medication, MD;  Location: [facility] OR;  Service: Radiology;  Laterality: N/A;    There were no vitals filed for this visit.   Subjective Assessment - 03/07/21 1614    Subjective "a little better"    Currently in Pain? No/denies                 ADULT SLP TREATMENT - 03/07/21 1611      General Information   Behavior/Cognition Alert;Cooperative;Confused;Impulsive;Requires cueing      Treatment Provided   Treatment provided Cognitive-Linquistic      Cognitive-Linquistic Treatment   Treatment focused on Apraxia;Aphasia;Patient/family/caregiver education    Skilled Treatment Pt noted with questionable comprehension during discussion of HEP requiring increased SLP assistance to improve pt's comprehension of  questions/instructions. HEP reviewed, in which pt named words in isolation during categorization tasks with 83% accucary given intermittent self-corrections. Occasional mod to max verbal and visual cues required to correct apraxic errors for both monosyllabic and multisyllabic words. Errors noted for /k, g, p, b/ this session. SLP trialed picture description tasks, in which pt was approximately 25% intelligible. Pt noted to frequently omit subjects and exhibited usual apraxic errors. SLP wrote correct picture description sentences, in which pt able to read with min A.      Assessment / Recommendations / Plan   Plan Continue with current plan of care      Progression Toward Goals   Progression toward goals Progressing toward goals            SLP Education - 03/07/21 1657    Education Details slow down, omissions, HEP    Person(s) Educated Patient    Methods Explanation;Demonstration;Handout    Comprehension Verbalized understanding;Returned demonstration;Need further instruction            SLP Short Term Goals - 03/07/21 1612      SLP SHORT TERM GOAL #1   Title Pt will complete HEP with min A over 3 sessions    Baseline 02-05-21, 02-19-21, 02-26-21    Period --   or 9 visits for all STGs   Status Achieved      SLP SHORT TERM GOAL #2   Title Pt will  use mulitmodal communication (gesture, draw, write 1st letter etc) to augment verbal expression with usual min A over 2 sessions    Baseline 01-29-21 (spell), 02-12-21 (spell)    Status Achieved      SLP SHORT TERM GOAL #3   Title Pt will verbally ID and correct speech errors in paragraph level reading or picture description tasks with min A over 2 sessions    Time 1    Period Weeks    Status On-going      SLP SHORT TERM GOAL #4   Title Pt will demonstrate speech compensations in simple 5 minute conversation with usual min A over 2 sessions    Baseline 02-19-21    Time 1    Period Weeks    Status On-going      SLP SHORT TERM GOAL #5    Title Pt will identify current communication effectiveness via QOL scale in first 2 sessions    Baseline CES: 19 & Comm SF: 13    Status Achieved            SLP Long Term Goals - 03/07/21 1612      SLP LONG TERM GOAL #1   Title Pt will use mulitmodal communication (gesture, draw, write 1st letter etc) to augment verbal expression to meet needs at home with rare min A from family.    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 Pt able to name monosyllabic and multisylalbic words in isolation with 83% accuracy this session with occasional self-corrections. SLP targeted carryover of compensations to improve apraxic speech during picture descriptions.  Significant decline in speech intelligibility noted during picture description tasks. Usual mod to max verbal and visual cues required to correct apraxic errors on targeted tasks this session. See "skilled treatment" for additional details of today's session. Skilled ST intervention is warranted to address significant apraxia and aphasia impacting pt's ability to effectively communicate with familiar and unfamiliar listeners and to increase QOL.    Speech Therapy Frequency 2x / week   pt only scheduled 1x/week per pt preference   Duration 8 weeks   or 17 total visits   Treatment/Interventions Compensatory strategies;Functional tasks;Patient/family  education;Cueing hierarchy;Environmental controls;Multimodal communcation approach;Compensatory techniques;Internal/external aids;SLP instruction and feedback;Language facilitation    Potential to Achieve Goals Fair    Potential Considerations Severity of impairments    SLP Home Exercise Plan provided    Consulted and Agree with Plan of Care Patient           Patient will benefit from skilled therapeutic intervention in order to improve the following deficits and impairments:   Verbal apraxia  Aphasia    Problem List Patient Active Problem List  Diagnosis Date Noted  . Dyslipidemia   . Right hemiparesis (HCC)   . Slow transit constipation   . History of hypertension   . Dysphagia, post-stroke   . Acute blood loss anemia   . Controlled type 2 diabetes mellitus with complication, without long-term current use of insulin (HCC)   . Stenosis of right internal carotid artery   . Carotid stenosis, left, symptomatic s/p L ICA stent 07/11/2020  . Carotid stenosis, asymptomatic, right 07/11/2020  . Essential hypertension 07/11/2020  . Hyperlipidemia LDL goal <70 07/11/2020  . Dysphagia due to recent stroke 07/11/2020  . Prediabetes 07/11/2020  . Middle cerebral artery embolism, left 07/04/2020  . Acute ischemic left ICA stroke (HCC) d/t LAA R ICA 07/03/2020  . Numbness 06/27/2020    Janann Colonel, MA CCC-SLP 03/07/2021, 5:54 PM  [facility] [address] [place], [place], [address] Phone: [phone]   Fax:  [phone]   Name: Sharon Gilbert MRN: [id number] Date of Birth: 1941-05-06

## 2021-03-12 ENCOUNTER — Encounter: Payer: Medicare HMO | Admitting: Occupational Therapy

## 2021-03-14 ENCOUNTER — Ambulatory Visit: Payer: Medicare HMO | Admitting: Occupational Therapy

## 2021-03-14 ENCOUNTER — Ambulatory Visit: Payer: Medicare HMO

## 2021-03-14 ENCOUNTER — Other Ambulatory Visit: Payer: Self-pay

## 2021-03-14 DIAGNOSIS — R482 Apraxia: Secondary | ICD-10-CM

## 2021-03-14 DIAGNOSIS — R4701 Aphasia: Secondary | ICD-10-CM | POA: Diagnosis not present

## 2021-03-14 DIAGNOSIS — R208 Other disturbances of skin sensation: Secondary | ICD-10-CM | POA: Diagnosis not present

## 2021-03-14 DIAGNOSIS — I69351 Hemiplegia and hemiparesis following cerebral infarction affecting right dominant side: Secondary | ICD-10-CM | POA: Diagnosis not present

## 2021-03-14 DIAGNOSIS — M79641 Pain in right hand: Secondary | ICD-10-CM | POA: Diagnosis not present

## 2021-03-14 NOTE — Therapy (Signed)
[place] [address] [place], [place], [address] Phone: [phone]   Fax:  [phone]  Speech Language Pathology Treatment  Patient Details  Name: Sharon Gilbert MRN: [id number] Date of Birth: April 12, 1941 Referring Provider (SLP): Deon Pilling, NP   Encounter Date: 03/14/2021   End of Session - 03/14/21 1625     Visit Number 9    Number of Visits 17    Date for SLP Re-Evaluation 04/16/21    Authorization Type [employer] Medicare    SLP Start Time 1623   pt arrived 8 minutes   SLP Stop Time  1700    SLP Time Calculation (min) 37 min    Activity Tolerance Patient tolerated treatment well             Past Medical History:  Diagnosis Date   Cataract    Diabetes mellitus without complication ([place])     Past Surgical History:  Procedure Laterality Date   IR CT HEAD LTD  07/04/2020   IR INTRAVSC STENT CERV CAROTID W/O EMB-PROT MOD SED INC ANGIO  07/04/2020   IR PERCUTANEOUS ART THROMBECTOMY/INFUSION INTRACRANIAL INC DIAG ANGIO  07/04/2020   IR RADIOLOGIST EVAL & MGMT  10/01/2020   IR RADIOLOGIST EVAL & MGMT  02/07/2021   RADIOLOGY WITH ANESTHESIA N/A 07/04/2020   Procedure: IR WITH ANESTHESIA;  Surgeon: Radiologist, Medication, MD;  Location: [place];  Service: Radiology;  Laterality: N/A;    There were no vitals filed for this visit.   Subjective Assessment - 03/14/21 1623     Subjective "it's been fine"    Currently in Pain? No/denies                   ADULT SLP TREATMENT - 03/14/21 1625       General Information   Behavior/Cognition Alert;Cooperative;Confused;Impulsive;Requires cueing      Treatment Provided   Treatment provided Cognitive-Linquistic      Cognitive-Linquistic Treatment   Treatment focused on Apraxia;Aphasia;Patient/family/caregiver education    Skilled Treatment Pt arrived 8 mins late. SLP reviewed HEP targeting aphasia and apraxia. Pt read targeted sentences with 89% accuracy.  Pt generated answer with 71% accuracy with usual mod A and prompting to name more appropriate responses. Usual mod to max cues required to correct apraxic errors due to inconsistency. SLP targeted verbal picture description task, in which pt able to generate sentence with appropriate syntax with 25% accuracy. Min to max cues required to formulate syntax, correct apraxic errors, and slow rate of speech.      Assessment / Recommendations / Plan   Plan Continue with current plan of care      Progression Toward Goals   Progression toward goals Progressing toward goals              SLP Education - 03/14/21 1805     Education Details HEP, compensations    Person(s) Educated Patient    Methods Explanation;Demonstration;Handout;Verbal cues    Comprehension Verbalized understanding;Returned demonstration;Verbal cues required;Need further instruction              SLP Short Term Goals - 03/14/21 1629       SLP SHORT TERM GOAL #1   Title Pt will complete HEP with min A over 3 sessions    Baseline 02-05-21, 02-19-21, 02-26-21    Period --   or 9 visits for all STGs   Status Achieved      SLP SHORT TERM GOAL #2   Title Pt will  use mulitmodal communication (gesture, draw, write 1st letter etc) to augment verbal expression with usual min A over 2 sessions    Baseline 01-29-21 (spell), 02-12-21 (spell)    Status Achieved      SLP SHORT TERM GOAL #3   Title Pt will verbally ID and correct speech errors in paragraph level reading or picture description tasks with min A over 2 sessions    Status Not Met      SLP SHORT TERM GOAL #4   Title Pt will demonstrate speech compensations in simple 5 minute conversation with usual min A over 2 sessions    Baseline 02-19-21    Status Partially Met      SLP SHORT TERM GOAL #5   Title Pt will identify current communication effectiveness via QOL scale in first 2 sessions    Baseline CES: 19 & Comm SF: 13    Status Achieved              SLP Long  Term Goals - 03/14/21 1639       SLP LONG TERM GOAL #1   Title Pt will use mulitmodal communication (gesture, draw, write 1st letter etc) to augment verbal expression to meet needs at home with rare min A from family.    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 SLP targeted carryover of compensations to improve apraxic speech during picture descriptions. Syntax did slightly improve given SLP modeling; however, usual apraxic errors noted in descriptions which impacted overall listener comprehension. Usual mod to max verbal and visual cues required to correct apraxic errors on targeted tasks this session. See "skilled treatment" for additional details of today's session. Skilled ST intervention is warranted to address significant apraxia and aphasia impacting pt's ability to effectively communicate with familiar and unfamiliar listeners and to increase QOL.    Speech Therapy Frequency 2x / week   pt only scheduled 1x/week per pt preference   Duration 8 weeks   or 17 total visits   Treatment/Interventions Compensatory strategies;Functional tasks;Patient/family education;Cueing hierarchy;Environmental controls;Multimodal communcation approach;Compensatory techniques;Internal/external aids;SLP instruction and feedback;Language facilitation    Potential to Achieve Goals Fair    Potential Considerations  Severity of impairments;Cooperation/participation level    SLP Home Exercise Plan provided    Consulted and Agree with Plan of Care Patient             Patient will benefit from skilled therapeutic intervention in order to improve the following deficits and impairments:   Verbal apraxia  Aphasia    Problem List Patient Active Problem List   Diagnosis Date Noted   Dyslipidemia    Right hemiparesis (HCC)    Slow transit constipation  History of hypertension    Dysphagia, post-stroke    Acute blood loss anemia    Controlled type 2 diabetes mellitus with complication, without long-term current use of insulin (HCC)    Stenosis of right internal carotid artery    Carotid stenosis, left, symptomatic s/p L ICA stent 07/11/2020   Carotid stenosis, asymptomatic, right 07/11/2020   Essential hypertension 07/11/2020   Hyperlipidemia LDL goal <70 07/11/2020   Dysphagia due to recent stroke 07/11/2020   Prediabetes 07/11/2020   Middle cerebral artery embolism, left 07/04/2020   Acute ischemic left ICA stroke ([place]) d/t LAA R ICA 07/03/2020   Numbness 06/27/2020    Alinda Deem, MA CCC-SLP 03/14/2021, 6:08 PM  [place] [address] [place], [place], [address] Phone: [phone]   Fax:  [phone]   Name: Sharon Gilbert MRN: [id number] Date of Birth: [birth year]

## 2021-03-21 ENCOUNTER — Encounter: Payer: Medicare HMO | Admitting: Occupational Therapy

## 2021-03-21 ENCOUNTER — Ambulatory Visit: Payer: Medicare HMO

## 2021-03-21 ENCOUNTER — Other Ambulatory Visit: Payer: Self-pay

## 2021-03-21 DIAGNOSIS — R4701 Aphasia: Secondary | ICD-10-CM

## 2021-03-21 DIAGNOSIS — R208 Other disturbances of skin sensation: Secondary | ICD-10-CM | POA: Diagnosis not present

## 2021-03-21 DIAGNOSIS — R482 Apraxia: Secondary | ICD-10-CM

## 2021-03-21 DIAGNOSIS — I69351 Hemiplegia and hemiparesis following cerebral infarction affecting right dominant side: Secondary | ICD-10-CM | POA: Diagnosis not present

## 2021-03-21 DIAGNOSIS — M79641 Pain in right hand: Secondary | ICD-10-CM | POA: Diagnosis not present

## 2021-03-21 NOTE — Therapy (Signed)
[place] [address] [place], [place], [address] Phone: [phone]   Fax:  [phone]  Speech Language Pathology Treatment/Progress Note  Patient Details  Name: Sharon Gilbert MRN: [id number] Date of Birth: 26-Jul-1941 Referring Provider (SLP): Deon Pilling, NP   Encounter Date: 03/21/2021   End of Session - 03/21/21 1622     Visit Number 10    Number of Visits 17    Date for SLP Re-Evaluation 04/16/21    Authorization Type [employer] Medicare    SLP Start Time 1620   pt arrived 5 mins late   SLP Stop Time  1700    SLP Time Calculation (min) 40 min    Activity Tolerance Patient tolerated treatment well             Past Medical History:  Diagnosis Date   Cataract    Diabetes mellitus without complication ([place])     Past Surgical History:  Procedure Laterality Date   IR CT HEAD LTD  07/04/2020   IR INTRAVSC STENT CERV CAROTID W/O EMB-PROT MOD SED INC ANGIO  07/04/2020   IR PERCUTANEOUS ART THROMBECTOMY/INFUSION INTRACRANIAL INC DIAG ANGIO  07/04/2020   IR RADIOLOGIST EVAL & MGMT  10/01/2020   IR RADIOLOGIST EVAL & MGMT  02/07/2021   RADIOLOGY WITH ANESTHESIA N/A 07/04/2020   Procedure: IR WITH ANESTHESIA;  Surgeon: Radiologist, Medication, MD;  Location: [facility];  Service: Radiology;  Laterality: N/A;    There were no vitals filed for this visit.   Subjective Assessment - 03/21/21 1621     Subjective "Everything. Just cleaning around the house"    Currently in Pain? No/denies              Speech Therapy Progress Note  Dates of Reporting Period: 01-16-21 to Present  Objective Reports of Subjective Statement: Pt has participated in [place] visits targeting apraxia and aphasia. Pt has made slow progress thus far to improve expressive speech and language due to severity of deficits, reduced frequency of visits per patient preference, and inconsistent comprehension and carryover at home.   Objective  Measurements: see below   Goal Update: see goals below  Plan: continue per POC  Reason Skilled Services are Required: pt has not yet met max rehab potential and pt would continue to benefit from ST intervention to maximize communication effectiveness     ADULT SLP TREATMENT - 03/21/21 1620       General Information   Behavior/Cognition Alert;Cooperative;Confused;Impulsive;Requires cueing      Treatment Provided   Treatment provided Cognitive-Linquistic      Cognitive-Linquistic Treatment   Treatment focused on Apraxia;Aphasia;Patient/family/caregiver education    Skilled Treatment Pt arrived 5 minutes late. SLP reviewed HEP targeting naming and articulation. Poor comprehension of divergent naming tasks noted given category and specific letter prompt. Pt noted to identify item with correct letter but incorrect category. Usual mod to max semantic and phonemic cues required to correct errors. Occasional articulation errors noted while reading answers, which required mod SLP assistance to correct. Increased difficulty with production of /k, b/ sounds noted this session, with SLP targeting /k/ sounds at beginning, middle, and end of words. Rare min A required to correct errors with occasional self-corrections exhibited. Pt continues to benefit from cues to slow rate of production for improved articulatory accuracy.      Assessment / Recommendations / Plan   Plan Continue with current plan of care      Progression Toward Goals   Progression  toward goals Progressing toward goals              SLP Education - 03/21/21 1708     Education Details errored sounds, slow rate, compensations    Person(s) Educated Patient    Methods Explanation;Demonstration;Handout    Comprehension Verbalized understanding;Returned demonstration;Need further instruction              SLP Short Term Goals - 03/14/21 1629       SLP SHORT TERM GOAL #1   Title Pt will complete HEP with min A over 3  sessions    Baseline 02-05-21, 02-19-21, 02-26-21    Period --   or 9 visits for all STGs   Status Achieved      SLP SHORT TERM GOAL #2   Title Pt will use mulitmodal communication (gesture, draw, write 1st letter etc) to augment verbal expression with usual min A over 2 sessions    Baseline 01-29-21 (spell), 02-12-21 (spell)    Status Achieved      SLP SHORT TERM GOAL #3   Title Pt will verbally ID and correct speech errors in paragraph level reading or picture description tasks with min A over 2 sessions    Status Not Met      SLP SHORT TERM GOAL #4   Title Pt will demonstrate speech compensations in simple 5 minute conversation with usual min A over 2 sessions    Baseline 02-19-21    Status Partially Met      SLP SHORT TERM GOAL #5   Title Pt will identify current communication effectiveness via QOL scale in first 2 sessions    Baseline CES: 19 & Comm SF: 13    Status Achieved              SLP Long Term Goals - 03/21/21 1623       SLP LONG TERM GOAL #1   Title Pt will use mulitmodal communication (gesture, draw, write 1st letter etc) to augment verbal expression to meet needs at home with rare min A from family.    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 SLP targeted naming and articulation via divergent naming tasks. Usual mod to max verbal and visual cues required for  comprehension of task, with occasional mod to max verbal and visual cues to correct articulation errors with occasional self-corrections noted. Slow progress noted towards ST goals at this time due to severity of deficits, reduced number of visits per patient preference, and intermittent reduced comprehension. See "skilled treatment" for additional details of today's session. Skilled ST intervention is warranted to address significant apraxia and aphasia impacting pt's ability to effectively communicate with familiar and  unfamiliar listeners and to increase QOL.    Speech Therapy Frequency 2x / week   pt only scheduled 1x/week per pt preference   Duration 8 weeks   or 17 total visits   Treatment/Interventions Compensatory strategies;Functional tasks;Patient/family education;Cueing hierarchy;Environmental controls;Multimodal communcation approach;Compensatory techniques;Internal/external aids;SLP instruction and feedback;Language facilitation    Potential to Achieve Goals Fair    Potential Considerations Severity of impairments;Cooperation/participation level    SLP Home Exercise Plan provided    Consulted and Agree with Plan of Care Patient             Patient will benefit from skilled therapeutic intervention in order to improve the following deficits and impairments:   Verbal apraxia  Aphasia    Problem List Patient Active Problem List   Diagnosis Date Noted   Dyslipidemia    Right hemiparesis (HCC)    Slow transit constipation    History of hypertension    Dysphagia, post-stroke    Acute blood loss anemia    Controlled type 2 diabetes mellitus with complication, without long-term current use of insulin (HCC)    Stenosis of right internal carotid artery    Carotid stenosis, left, symptomatic s/p L ICA stent 07/11/2020   Carotid stenosis, asymptomatic, right 07/11/2020   Essential hypertension 07/11/2020   Hyperlipidemia LDL goal <70 07/11/2020   Dysphagia due to recent stroke  07/11/2020   Prediabetes 07/11/2020   Middle cerebral artery embolism, left 07/04/2020   Acute ischemic left ICA stroke ([place]) d/t LAA R ICA 07/03/2020   Numbness 06/27/2020    Alinda Deem, MA CCC-SLP 03/21/2021, 5:12 PM  [facility] [address] [place], [place], [address] Phone: [phone]   Fax:  [phone]   Name: SAMIE BARCLIFT MRN: [id number] Date of Birth: 1941/03/19

## 2021-03-28 ENCOUNTER — Ambulatory Visit: Payer: Medicare HMO

## 2021-03-28 ENCOUNTER — Other Ambulatory Visit: Payer: Self-pay

## 2021-03-28 ENCOUNTER — Encounter: Payer: Medicare HMO | Admitting: Occupational Therapy

## 2021-03-28 DIAGNOSIS — R482 Apraxia: Secondary | ICD-10-CM | POA: Diagnosis not present

## 2021-03-28 DIAGNOSIS — R4701 Aphasia: Secondary | ICD-10-CM | POA: Diagnosis not present

## 2021-03-28 DIAGNOSIS — I69351 Hemiplegia and hemiparesis following cerebral infarction affecting right dominant side: Secondary | ICD-10-CM | POA: Diagnosis not present

## 2021-03-28 DIAGNOSIS — M79641 Pain in right hand: Secondary | ICD-10-CM | POA: Diagnosis not present

## 2021-03-28 DIAGNOSIS — R208 Other disturbances of skin sensation: Secondary | ICD-10-CM | POA: Diagnosis not present

## 2021-03-28 NOTE — Patient Instructions (Signed)
  [facility] at [facility] to schedule 1x/week for 3 more weeks if you want to continue speech therapy

## 2021-03-28 NOTE — Therapy (Signed)
[facility] [address] [place], [place], [address] Phone: [phone]   Fax:  [phone]  Speech Language Pathology Treatment  Patient Details  Name: Sharon Gilbert MRN: [id number] Date of Birth: [birth year] Referring Provider (SLP): Deon Pilling, NP   Encounter Date: 03/28/2021   End of Session - 03/28/21 1614     Visit Number 11    Number of Visits 17    Date for SLP Re-Evaluation 04/16/21    Authorization Type [employer]    SLP Start Time 1615    SLP Stop Time  1700    SLP Time Calculation (min) 45 min    Activity Tolerance Patient tolerated treatment well             Past Medical History:  Diagnosis Date   Cataract    Diabetes mellitus without complication ([facility])     Past Surgical History:  Procedure Laterality Date   IR CT HEAD LTD  07/04/2020   IR INTRAVSC STENT CERV CAROTID W/O EMB-PROT MOD SED INC ANGIO  07/04/2020   IR PERCUTANEOUS ART THROMBECTOMY/INFUSION INTRACRANIAL INC DIAG ANGIO  07/04/2020   IR RADIOLOGIST EVAL & MGMT  10/01/2020   IR RADIOLOGIST EVAL & MGMT  02/07/2021   RADIOLOGY WITH ANESTHESIA N/A 07/04/2020   Procedure: IR WITH ANESTHESIA;  Surgeon: Radiologist, Medication, MD;  Location: [facility];  Service: Radiology;  Laterality: N/A;    There were no vitals filed for this visit.   Subjective Assessment - 03/28/21 1615     Subjective "fine fine fine"    Currently in Pain? No/denies                   ADULT SLP TREATMENT - 03/28/21 1614       General Information   Behavior/Cognition Alert;Cooperative;Confused;Impulsive;Requires cueing      Treatment Provided   Treatment provided Cognitive-Linquistic      Cognitive-Linquistic Treatment   Treatment focused on Apraxia;Aphasia;Patient/family/caregiver education    Skilled Treatment SLP reviewed HEP for targeted errored sound /k/ with pt generating sentences with targeted word. Pt noted to begin each sentence with  targeted sound, reflecting reduced comprehension of instructions. Usual omissions of articles and linking verbs noted that impacted overall fluency of sentences. SLP provided usual fading to occasional modeling and cues to improve grammar and syntax of sentences. Occasional verbal cues required to improve articulation and correct errors. Pt benefits from verbal cues to reduce tension and slow down.      Assessment / Recommendations / Plan   Plan Continue with current plan of care      Progression Toward Goals   Progression toward goals Progressing toward goals              SLP Education - 03/28/21 1705     Education Details syntax, errored sounds, recommendation to continue ST    Person(s) Educated Patient    Methods Explanation;Demonstration;Handout    Comprehension Verbalized understanding;Returned demonstration;Need further instruction              SLP Short Term Goals - 03/14/21 1629       SLP SHORT TERM GOAL #1   Title Pt will complete HEP with min A over 3 sessions    Baseline 02-05-21, 02-19-21, 02-26-21    Period --   or 9 visits for all STGs   Status Achieved      SLP SHORT TERM GOAL #2   Title Pt will use mulitmodal communication (gesture, draw, write 1st  letter etc) to augment verbal expression with usual min A over 2 sessions    Baseline 01-29-21 (spell), 02-12-21 (spell)    Status Achieved      SLP SHORT TERM GOAL #3   Title Pt will verbally ID and correct speech errors in paragraph level reading or picture description tasks with min A over 2 sessions    Status Not Met      SLP SHORT TERM GOAL #4   Title Pt will demonstrate speech compensations in simple 5 minute conversation with usual min A over 2 sessions    Baseline 02-19-21    Status Partially Met      SLP SHORT TERM GOAL #5   Title Pt will identify current communication effectiveness via QOL scale in first 2 sessions    Baseline CES: 19 & Comm SF: 13    Status Achieved              SLP Long  Term Goals - 03/28/21 1615       SLP LONG TERM GOAL #1   Title Pt will use mulitmodal communication (gesture, draw, write 1st letter etc) to augment verbal expression to meet needs at home with rare min A from family.    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 SLP targeted naming and generation of sentences for targeted words, with usual cues required to produce grammatically correct syntax due to omissions. Occasional verbal and visual cues required to correct articulation errors at word and short sentence level. Today was last scheduled visit, but SLP recommended to patient to continue with POC due to pt has not yet met rehab potential. Skilled ST intervention is warranted to address significant apraxia and aphasia impacting pt's ability to effectively communicate with familiar and unfamiliar listeners and to increase QOL.    Speech Therapy Frequency 2x / week   pt only scheduled 1x/week per pt preference   Duration 8 weeks   or 17 total visits   Treatment/Interventions Compensatory strategies;Functional tasks;Patient/family education;Cueing hierarchy;Environmental controls;Multimodal communcation approach;Compensatory techniques;Internal/external aids;SLP instruction and feedback;Language facilitation    Potential to Achieve Goals Fair    Potential Considerations Severity of  impairments;Cooperation/participation level    SLP Home Exercise Plan provided    Consulted and Agree with Plan of Care Patient             Patient will benefit from skilled therapeutic intervention in order to improve the following deficits and impairments:   Verbal apraxia  Aphasia    Problem List Patient Active Problem List   Diagnosis Date Noted   Dyslipidemia    Right hemiparesis (HCC)    Slow transit constipation    History of  hypertension    Dysphagia, post-stroke    Acute blood loss anemia    Controlled type 2 diabetes mellitus with complication, without long-term current use of insulin (HCC)    Stenosis of right internal carotid artery    Carotid stenosis, left, symptomatic s/p L ICA stent 07/11/2020   Carotid stenosis, asymptomatic, right 07/11/2020   Essential hypertension 07/11/2020   Hyperlipidemia LDL goal <70 07/11/2020   Dysphagia due to recent stroke 07/11/2020   Prediabetes 07/11/2020   Middle cerebral artery embolism, left 07/04/2020   Acute ischemic left ICA stroke ([place]) d/t LAA R ICA 07/03/2020   Numbness 06/27/2020    Alinda Deem, MA CCC-SLP 03/28/2021, 5:17 PM  [facility] [address] [place], [place], [address] Phone: [phone]   Fax:  [phone]   Name: KASIE LECCESE MRN: [id number] Date of Birth: 12/08/1940

## 2021-04-12 ENCOUNTER — Ambulatory Visit: Payer: Medicare HMO

## 2021-04-12 ENCOUNTER — Other Ambulatory Visit: Payer: Self-pay

## 2021-04-12 DIAGNOSIS — R4701 Aphasia: Secondary | ICD-10-CM | POA: Diagnosis not present

## 2021-04-12 DIAGNOSIS — R482 Apraxia: Secondary | ICD-10-CM | POA: Diagnosis not present

## 2021-04-12 NOTE — Therapy (Signed)
[facility] [address] [place], [place], [address] Phone: [phone]   Fax:  [phone]  Speech Language Pathology Treatment  Patient Details  Name: Sharon Gilbert MRN: [id number] Date of Birth: January 13, 1941 Referring Provider (SLP): Deon Pilling, NP   Encounter Date: 04/12/2021   End of Session - 04/12/21 1527     Visit Number 12    Number of Visits 17    Date for SLP Re-Evaluation 04/16/21    Authorization Type [employer]    SLP Start Time 1530    SLP Stop Time  1615    SLP Time Calculation (min) 45 min    Activity Tolerance Patient tolerated treatment well             Past Medical History:  Diagnosis Date   Cataract    Diabetes mellitus without complication ([facility])     Past Surgical History:  Procedure Laterality Date   IR CT HEAD LTD  07/04/2020   IR INTRAVSC STENT CERV CAROTID W/O EMB-PROT MOD SED INC ANGIO  07/04/2020   IR PERCUTANEOUS ART THROMBECTOMY/INFUSION INTRACRANIAL INC DIAG ANGIO  07/04/2020   IR RADIOLOGIST EVAL & MGMT  10/01/2020   IR RADIOLOGIST EVAL & MGMT  02/07/2021   RADIOLOGY WITH ANESTHESIA N/A 07/04/2020   Procedure: IR WITH ANESTHESIA;  Surgeon: Radiologist, Medication, MD;  Location: [facility];  Service: Radiology;  Laterality: N/A;    There were no vitals filed for this visit.   Subjective Assessment - 04/12/21 1528     Subjective "it's getting a little better"    Currently in Pain? No/denies                   ADULT SLP TREATMENT - 04/12/21 1527       General Information   Behavior/Cognition Alert;Cooperative;Confused;Impulsive;Requires cueing      Treatment Provided   Treatment provided Cognitive-Linquistic      Cognitive-Linquistic Treatment   Treatment focused on Apraxia;Aphasia;Patient/family/caregiver education    Skilled Treatment Pt completed HEP for generating sentences with words given specific targeted sound /b/. Pt was ~40% accurate/intelligible in  verbalization of simple phrases/sentences. Intermittent min to mod verbal and visual cues to correct articulation errors. Occasional cues required to slow rate. Usual omission of articles exhibited in syntax. Scrambled sentences provided as HEP.      Assessment / Recommendations / Plan   Plan Continue with current plan of care     Progression Toward Goals   Progression toward goals Progressing toward goals              SLP Education - 04/12/21 1617     Education Details HEP for syntax    Person(s) Educated Patient    Methods Explanation;Demonstration;Handout;Verbal cues    Comprehension Verbalized understanding;Returned demonstration;Need further instruction              SLP Short Term Goals - 03/14/21 1629       SLP SHORT TERM GOAL #1   Title Pt will complete HEP with min A over 3 sessions    Baseline 02-05-21, 02-19-21, 02-26-21    Period --   or 9 visits for all STGs   Status Achieved      SLP SHORT TERM GOAL #2   Title Pt will use mulitmodal communication (gesture, draw, write 1st letter etc) to augment verbal expression with usual min A over 2 sessions    Baseline 01-29-21 (spell), 02-12-21 (spell)    Status Achieved  SLP SHORT TERM GOAL #3   Title Pt will verbally ID and correct speech errors in paragraph level reading or picture description tasks with min A over 2 sessions    Status Not Met      SLP SHORT TERM GOAL #4   Title Pt will demonstrate speech compensations in simple 5 minute conversation with usual min A over 2 sessions    Baseline 02-19-21    Status Partially Met      SLP SHORT TERM GOAL #5   Title Pt will identify current communication effectiveness via QOL scale in first 2 sessions    Baseline CES: 19 & Comm SF: 13    Status Achieved              SLP Long Term Goals - 04/12/21 1528       SLP LONG TERM GOAL #1   Title Pt will use mulitmodal communication (gesture, draw, write 1st letter etc) to augment verbal expression to meet needs at  home with rare min A from family.    Time --    Period --    Status Achieved      SLP LONG TERM GOAL #2   Title Pt will demonstrate speech compensations in simple 5 minute conversation with occasional mod A to be 75% intelligible over 2 sessions    Time 1    Period Weeks    Status Revised      SLP LONG TERM GOAL #3   Title Pt will report improvements in communication effectiveness via QOL scale by last ST session    Time 1    Period Weeks    Status On-going      SLP LONG TERM GOAL #4   Title --    Time --    Period --    Status --              Plan - 04/12/21 1548     Clinical Impression Statement Sharon Gilbert presents to OPST intervention to address 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 SLP targeted generation and verbal production of simple sentences using /b/ words. Pt continues to present with inconsistent awareness of apraxic articulation errors, requiring intermittent min to mod cues to correct errors, slow rate, and fix omisisons of syntax. Pt has not yet met rehab potential; therefore, SLP recommends additional ST visits. Due to family scheduling requirements, pt put on hold from [facility] until early to mid August secondary to scheduling conficts. ST intervention is warranted to address significant apraxia and aphasia impacting pt's ability to effectively communicate with familiar and unfamiliar listeners and to increase QOL.    Speech Therapy Frequency 2x / week   patient requested only 1x/week   Duration 8 weeks    Treatment/Interventions Compensatory strategies;Functional tasks;Patient/family education;Cueing hierarchy;Environmental controls;Multimodal communcation approach;Compensatory techniques;Internal/external aids;SLP instruction and feedback;Language facilitation    Potential to Achieve Goals Fair    Potential Considerations Severity of impairments;Cooperation/participation level    SLP Home Exercise Plan provided     Consulted and Agree with Plan of Care Patient             Patient will benefit from skilled therapeutic intervention in order to improve the following deficits and impairments:   Verbal apraxia  Aphasia    Problem List Patient Active Problem List   Diagnosis Date Noted   Dyslipidemia    Right hemiparesis (HCC)    Slow transit constipation    History of hypertension  Dysphagia, post-stroke    Acute blood loss anemia    Controlled type 2 diabetes mellitus with complication, without long-term current use of insulin (HCC)    Stenosis of right internal carotid artery    Carotid stenosis, left, symptomatic s/p L ICA stent 07/11/2020   Carotid stenosis, asymptomatic, right 07/11/2020   Essential hypertension 07/11/2020   Hyperlipidemia LDL goal <70 07/11/2020   Dysphagia due to recent stroke 07/11/2020   Prediabetes 07/11/2020   Middle cerebral artery embolism, left 07/04/2020   Acute ischemic left ICA stroke ([place]) d/t LAA R ICA 07/03/2020   Numbness 06/27/2020    Alinda Deem, MA CCC-SLP 04/12/2021, 5:10 PM  [facility] [address] [place], [place], [address] Phone: [phone]   Fax:  [phone]   Name: Sharon Gilbert MRN: [id number] Date of Birth: 05-21-1941

## 2021-04-15 DIAGNOSIS — W57XXXA Bitten or stung by nonvenomous insect and other nonvenomous arthropods, initial encounter: Secondary | ICD-10-CM | POA: Diagnosis not present

## 2021-04-15 DIAGNOSIS — L299 Pruritus, unspecified: Secondary | ICD-10-CM | POA: Diagnosis not present

## 2021-04-24 DIAGNOSIS — I2510 Atherosclerotic heart disease of native coronary artery without angina pectoris: Secondary | ICD-10-CM | POA: Diagnosis not present

## 2021-04-24 DIAGNOSIS — E860 Dehydration: Secondary | ICD-10-CM | POA: Diagnosis not present

## 2021-04-24 DIAGNOSIS — R4182 Altered mental status, unspecified: Secondary | ICD-10-CM | POA: Diagnosis not present

## 2021-04-24 DIAGNOSIS — Z743 Need for continuous supervision: Secondary | ICD-10-CM | POA: Diagnosis not present

## 2021-04-24 DIAGNOSIS — I6523 Occlusion and stenosis of bilateral carotid arteries: Secondary | ICD-10-CM | POA: Diagnosis not present

## 2021-04-24 DIAGNOSIS — Z20822 Contact with and (suspected) exposure to covid-19: Secondary | ICD-10-CM | POA: Diagnosis not present

## 2021-04-24 DIAGNOSIS — R55 Syncope and collapse: Secondary | ICD-10-CM | POA: Diagnosis not present

## 2021-04-24 DIAGNOSIS — U071 COVID-19: Secondary | ICD-10-CM | POA: Diagnosis not present

## 2021-04-24 DIAGNOSIS — I951 Orthostatic hypotension: Secondary | ICD-10-CM | POA: Diagnosis not present

## 2021-04-24 DIAGNOSIS — E785 Hyperlipidemia, unspecified: Secondary | ICD-10-CM | POA: Diagnosis not present

## 2021-04-24 DIAGNOSIS — I6529 Occlusion and stenosis of unspecified carotid artery: Secondary | ICD-10-CM | POA: Diagnosis not present

## 2021-04-24 DIAGNOSIS — I6521 Occlusion and stenosis of right carotid artery: Secondary | ICD-10-CM | POA: Diagnosis not present

## 2021-04-24 DIAGNOSIS — G9341 Metabolic encephalopathy: Secondary | ICD-10-CM | POA: Diagnosis not present

## 2021-04-24 DIAGNOSIS — E876 Hypokalemia: Secondary | ICD-10-CM | POA: Diagnosis not present

## 2021-04-24 DIAGNOSIS — I10 Essential (primary) hypertension: Secondary | ICD-10-CM | POA: Diagnosis not present

## 2021-04-25 DIAGNOSIS — I6523 Occlusion and stenosis of bilateral carotid arteries: Secondary | ICD-10-CM | POA: Diagnosis not present

## 2021-04-25 DIAGNOSIS — R55 Syncope and collapse: Secondary | ICD-10-CM | POA: Diagnosis not present

## 2021-04-25 DIAGNOSIS — U071 COVID-19: Secondary | ICD-10-CM | POA: Diagnosis not present

## 2021-04-25 DIAGNOSIS — I10 Essential (primary) hypertension: Secondary | ICD-10-CM | POA: Diagnosis not present

## 2021-04-25 DIAGNOSIS — E785 Hyperlipidemia, unspecified: Secondary | ICD-10-CM | POA: Diagnosis not present

## 2021-04-26 DIAGNOSIS — R55 Syncope and collapse: Secondary | ICD-10-CM | POA: Diagnosis not present

## 2021-04-26 DIAGNOSIS — I10 Essential (primary) hypertension: Secondary | ICD-10-CM | POA: Diagnosis not present

## 2021-04-26 DIAGNOSIS — E785 Hyperlipidemia, unspecified: Secondary | ICD-10-CM | POA: Diagnosis not present

## 2021-04-26 DIAGNOSIS — U071 COVID-19: Secondary | ICD-10-CM | POA: Diagnosis not present

## 2021-04-27 DIAGNOSIS — R55 Syncope and collapse: Secondary | ICD-10-CM | POA: Diagnosis not present

## 2021-04-27 DIAGNOSIS — U071 COVID-19: Secondary | ICD-10-CM | POA: Diagnosis not present

## 2021-04-27 DIAGNOSIS — E785 Hyperlipidemia, unspecified: Secondary | ICD-10-CM | POA: Diagnosis not present

## 2021-04-27 DIAGNOSIS — I10 Essential (primary) hypertension: Secondary | ICD-10-CM | POA: Diagnosis not present

## 2021-05-09 ENCOUNTER — Encounter (HOSPITAL_COMMUNITY): Payer: Self-pay

## 2021-05-16 ENCOUNTER — Other Ambulatory Visit: Payer: Self-pay

## 2021-05-16 ENCOUNTER — Ambulatory Visit: Payer: Medicare HMO

## 2021-05-16 DIAGNOSIS — I69390 Apraxia following cerebral infarction: Secondary | ICD-10-CM | POA: Insufficient documentation

## 2021-05-16 DIAGNOSIS — I69320 Aphasia following cerebral infarction: Secondary | ICD-10-CM | POA: Insufficient documentation

## 2021-05-17 ENCOUNTER — Other Ambulatory Visit (HOSPITAL_COMMUNITY): Payer: Self-pay | Admitting: Radiology

## 2021-05-17 ENCOUNTER — Other Ambulatory Visit (HOSPITAL_COMMUNITY): Payer: Self-pay | Admitting: Interventional Radiology

## 2021-05-17 DIAGNOSIS — I69390 Apraxia following cerebral infarction: Secondary | ICD-10-CM | POA: Diagnosis not present

## 2021-05-17 DIAGNOSIS — I6602 Occlusion and stenosis of left middle cerebral artery: Secondary | ICD-10-CM

## 2021-05-17 DIAGNOSIS — R482 Apraxia: Secondary | ICD-10-CM | POA: Diagnosis not present

## 2021-05-17 DIAGNOSIS — R4701 Aphasia: Secondary | ICD-10-CM | POA: Diagnosis not present

## 2021-05-17 DIAGNOSIS — I69320 Aphasia following cerebral infarction: Secondary | ICD-10-CM | POA: Diagnosis not present

## 2021-05-21 ENCOUNTER — Other Ambulatory Visit (HOSPITAL_COMMUNITY): Payer: Self-pay | Admitting: Interventional Radiology

## 2021-05-21 ENCOUNTER — Other Ambulatory Visit: Payer: Self-pay | Admitting: Internal Medicine

## 2021-05-22 ENCOUNTER — Telehealth (HOSPITAL_COMMUNITY): Payer: Self-pay

## 2021-05-22 ENCOUNTER — Encounter (HOSPITAL_COMMUNITY): Payer: Self-pay | Admitting: Interventional Radiology

## 2021-05-22 LAB — SARS CORONAVIRUS 2 (TAT 6-24 HRS): SARS Coronavirus 2: POSITIVE — AB

## 2021-05-22 NOTE — Progress Notes (Signed)
[facility] called regarding a positive pre-op COVID result. Interventional radiology notified and will reach out to surgeon and patient.   Viviano Simas, RN

## 2021-05-22 NOTE — Telephone Encounter (Signed)
Called to cancel procedure for 8/18, no answer, left vm. AW

## 2021-05-23 ENCOUNTER — Ambulatory Visit: Payer: Medicare HMO

## 2021-05-23 ENCOUNTER — Encounter (HOSPITAL_COMMUNITY): Payer: Self-pay

## 2021-05-23 ENCOUNTER — Encounter (HOSPITAL_COMMUNITY): Admission: RE | Payer: Self-pay | Source: Home / Self Care

## 2021-05-23 ENCOUNTER — Inpatient Hospital Stay (HOSPITAL_COMMUNITY): Admission: RE | Admit: 2021-05-23 | Payer: Medicare HMO | Source: Ambulatory Visit

## 2021-05-23 ENCOUNTER — Inpatient Hospital Stay (HOSPITAL_COMMUNITY): Admission: RE | Admit: 2021-05-23 | Payer: Medicare HMO | Source: Home / Self Care | Admitting: Interventional Radiology

## 2021-05-23 HISTORY — DX: Cerebral infarction, unspecified: I63.9

## 2021-05-23 HISTORY — DX: Essential (primary) hypertension: I10

## 2021-05-23 SURGERY — IR WITH ANESTHESIA
Anesthesia: General

## 2021-05-24 LAB — PLATELET INHIBITION P2Y12

## 2021-05-30 ENCOUNTER — Ambulatory Visit: Payer: Medicare HMO

## 2021-06-13 ENCOUNTER — Ambulatory Visit: Payer: Medicare HMO

## 2021-06-17 ENCOUNTER — Telehealth (HOSPITAL_COMMUNITY): Payer: Self-pay | Admitting: Radiology

## 2021-06-17 NOTE — Telephone Encounter (Signed)
Returned pt's call to reschedule her procedure with Dr. Corliss Skains. No answer, left VM. JM

## 2021-06-20 ENCOUNTER — Ambulatory Visit: Payer: Medicare HMO

## 2021-07-02 ENCOUNTER — Other Ambulatory Visit (HOSPITAL_COMMUNITY): Payer: Self-pay | Admitting: Radiology

## 2021-07-02 ENCOUNTER — Other Ambulatory Visit (HOSPITAL_COMMUNITY)
Admission: RE | Admit: 2021-07-02 | Discharge: 2021-07-02 | Disposition: A | Discharge status: Home / Self Care | Payer: Medicare HMO | Source: Ambulatory Visit | Attending: Interventional Radiology | Admitting: Interventional Radiology

## 2021-07-02 DIAGNOSIS — I6522 Occlusion and stenosis of left carotid artery: Secondary | ICD-10-CM | POA: Insufficient documentation

## 2021-07-08 ENCOUNTER — Other Ambulatory Visit (HOSPITAL_COMMUNITY): Payer: Self-pay | Admitting: Interventional Radiology

## 2021-07-08 DIAGNOSIS — Z7902 Long term (current) use of antithrombotics/antiplatelets: Secondary | ICD-10-CM | POA: Diagnosis not present

## 2021-07-08 DIAGNOSIS — Z87891 Personal history of nicotine dependence: Secondary | ICD-10-CM | POA: Diagnosis not present

## 2021-07-08 DIAGNOSIS — H409 Unspecified glaucoma: Secondary | ICD-10-CM | POA: Diagnosis not present

## 2021-07-08 DIAGNOSIS — I69328 Other speech and language deficits following cerebral infarction: Secondary | ICD-10-CM | POA: Diagnosis not present

## 2021-07-08 DIAGNOSIS — I69331 Monoplegia of upper limb following cerebral infarction affecting right dominant side: Secondary | ICD-10-CM | POA: Diagnosis not present

## 2021-07-08 DIAGNOSIS — Z008 Encounter for other general examination: Secondary | ICD-10-CM | POA: Diagnosis not present

## 2021-07-08 DIAGNOSIS — Z7982 Long term (current) use of aspirin: Secondary | ICD-10-CM | POA: Diagnosis not present

## 2021-07-08 DIAGNOSIS — I10 Essential (primary) hypertension: Secondary | ICD-10-CM | POA: Diagnosis not present

## 2021-07-08 DIAGNOSIS — E663 Overweight: Secondary | ICD-10-CM | POA: Diagnosis not present

## 2021-07-08 DIAGNOSIS — Z6825 Body mass index (BMI) 25.0-25.9, adult: Secondary | ICD-10-CM | POA: Diagnosis not present

## 2021-07-08 DIAGNOSIS — Z803 Family history of malignant neoplasm of breast: Secondary | ICD-10-CM | POA: Diagnosis not present

## 2021-07-08 DIAGNOSIS — E785 Hyperlipidemia, unspecified: Secondary | ICD-10-CM | POA: Diagnosis not present

## 2021-07-08 DIAGNOSIS — I739 Peripheral vascular disease, unspecified: Secondary | ICD-10-CM | POA: Diagnosis not present

## 2021-07-09 ENCOUNTER — Encounter (HOSPITAL_COMMUNITY): Payer: Self-pay | Admitting: Interventional Radiology

## 2021-07-09 ENCOUNTER — Other Ambulatory Visit (HOSPITAL_COMMUNITY): Payer: Self-pay | Admitting: Physician Assistant

## 2021-07-09 ENCOUNTER — Other Ambulatory Visit: Payer: Self-pay | Admitting: Radiology

## 2021-07-09 ENCOUNTER — Other Ambulatory Visit: Payer: Self-pay

## 2021-07-09 LAB — SARS CORONAVIRUS 2 (TAT 6-24 HRS): SARS Coronavirus 2: NEGATIVE

## 2021-07-09 NOTE — Anesthesia Preprocedure Evaluation (Addendum)
Anesthesia Evaluation  Patient identified by MRN, date of birth, ID band Patient awake    Reviewed: Allergy & Precautions, NPO status , Patient's Chart, lab work & pertinent test results  Airway Mallampati: II  TM Distance: >3 FB Neck ROM: Full    Dental  (+) Teeth Intact, Dental Advisory Given   Pulmonary neg pulmonary ROS,    Pulmonary exam normal breath sounds clear to auscultation       Cardiovascular hypertension, Pt. on medications Normal cardiovascular exam Rhythm:Regular Rate:Normal  Echo 07/04/20: 1. Left ventricular ejection fraction, by estimation, is 70 to 75%. The  left ventricle has hyperdynamic function. The left ventricle has no  regional wall motion abnormalities. Left ventricular diastolic parameters  are consistent with Grade I diastolic  dysfunction (impaired relaxation).  2. Right ventricular systolic function is normal. The right ventricular  size is normal.  3. The mitral valve is normal in structure. No evidence of mitral valve  regurgitation. No evidence of mitral stenosis.  4. Tricuspid valve regurgitation is moderate.  5. The aortic valve is tricuspid. Aortic valve regurgitation is not  visualized. Mild to moderate aortic valve sclerosis/calcification is  present, without any evidence of aortic stenosis.  6. The inferior vena cava is normal in size with greater than 50%  respiratory variability, suggesting right atrial pressure of 3 mmHg.    Neuro/Psych -s/p L ICA stent and left MCA M1 thrombectomy with postprocedure hemorrhagic conversion at L MCA infarct -R ICA high-grade near occlusive stenosis with string  CVA, Residual Symptoms    GI/Hepatic negative GI ROS, Neg liver ROS,   Endo/Other  diabetes  Renal/GU negative Renal ROS     Musculoskeletal negative musculoskeletal ROS (+)   Abdominal   Peds  Hematology  (+) Blood dyscrasia (Plavix), ,   Anesthesia Other Findings    Reproductive/Obstetrics                            Anesthesia Physical Anesthesia Plan  ASA: 3  Anesthesia Plan: General   Post-op Pain Management:    Induction: Intravenous  PONV Risk Score and Plan: 3 and Dexamethasone and Ondansetron  Airway Management Planned: Oral ETT  Additional Equipment: Arterial line  Intra-op Plan:   Post-operative Plan: Extubation in OR  Informed Consent: I have reviewed the patients History and Physical, chart, labs and discussed the procedure including the risks, benefits and alternatives for the proposed anesthesia with the patient or authorized representative who has indicated his/her understanding and acceptance.     Dental advisory given  Plan Discussed with: CRNA  Anesthesia Plan Comments:        Anesthesia Quick Evaluation

## 2021-07-09 NOTE — Progress Notes (Signed)
PCP - Dr. Selena Batten Cardiologist - denies EKG - DOS Chest x-ray -  ECHO -  Cardiac Cath -  CPAP -   Pt son, Fredrik Cove, denies pt ever having diabetes (pt unable to take phone call d/t expressive aphasia from recent stroke)  Blood Thinner Instructions: pt has home med ASA 325mg  and Plavix 75mg  - per order pt to take those medications DOS  Aspirin Instructions:   ERAS Protcol - n/a  COVID TEST- pt covid+ 05/21/21 - pt was asymptomatic at that time, currently asymptomatic   Anesthesia review: n/a  -------------  SDW INSTRUCTIONS:  Your procedure is scheduled on 10/5 Wednesday. Please report to [facility] Main Entrance "A" at 0645 A.M., and check in at the Admitting office. Call this number if you have problems the morning of surgery: [phone]   Remember: Do not eat or drink after midnight the night before your surgery   Medications to take morning of surgery with a sip of water include: aspirin 325 mg clopidogrel (PLAVIX) 75mg    As of today, STOP taking any Aleve, Naproxen, Ibuprofen, Motrin, Advil, Goody's, BC's, all herbal medications, fish oil, and all vitamins.    The Morning of Surgery Do not wear jewelry, make-up or nail polish. Do not wear lotions, powders, or perfumes, or deodorant  Do not bring valuables to the hospital. [facility] is not responsible for any belongings or valuables.  If you are a smoker, DO NOT Smoke 24 hours prior to surgery  If you wear a CPAP at night please bring your mask the morning of surgery   Remember that you must have someone to transport you home after your surgery, and remain with you for 24 hours if you are discharged the same day.  Please bring cases for contacts, glasses, hearing aids, dentures or bridgework because it cannot be worn into surgery.   Patients discharged the day of surgery will not be allowed to drive home.   Please shower the NIGHT BEFORE/MORNING OF SURGERY (use antibacterial soap like DIAL soap if possible). Wear  comfortable clothes the morning of surgery. Oral Hygiene is also important to reduce your risk of infection.  Remember - BRUSH YOUR TEETH THE MORNING OF SURGERY WITH YOUR REGULAR TOOTHPASTE  Patient denies shortness of breath, fever, cough and chest pain.

## 2021-07-10 ENCOUNTER — Inpatient Hospital Stay (HOSPITAL_COMMUNITY)
Admission: AD | Admit: 2021-07-10 | Discharge: 2021-07-11 | DRG: 036 | Disposition: A | Discharge status: Home / Self Care | Payer: Medicare HMO | Attending: Interventional Radiology | Admitting: Interventional Radiology

## 2021-07-10 ENCOUNTER — Other Ambulatory Visit: Payer: Self-pay

## 2021-07-10 ENCOUNTER — Ambulatory Visit (HOSPITAL_COMMUNITY): Payer: Medicare HMO | Admitting: Anesthesiology

## 2021-07-10 ENCOUNTER — Encounter (HOSPITAL_COMMUNITY)
Admission: AD | Disposition: A | Discharge status: Home / Self Care | Payer: Self-pay | Source: Home / Self Care | Attending: Interventional Radiology

## 2021-07-10 ENCOUNTER — Ambulatory Visit (HOSPITAL_COMMUNITY)
Admission: RE | Admit: 2021-07-10 | Discharge: 2021-07-10 | Disposition: A | Discharge status: Home / Self Care | Payer: Medicare HMO | Source: Ambulatory Visit | Attending: Interventional Radiology | Admitting: Interventional Radiology

## 2021-07-10 ENCOUNTER — Encounter (HOSPITAL_COMMUNITY): Payer: Self-pay | Admitting: Interventional Radiology

## 2021-07-10 DIAGNOSIS — Z888 Allergy status to other drugs, medicaments and biological substances status: Secondary | ICD-10-CM

## 2021-07-10 DIAGNOSIS — E119 Type 2 diabetes mellitus without complications: Secondary | ICD-10-CM | POA: Diagnosis not present

## 2021-07-10 DIAGNOSIS — D62 Acute posthemorrhagic anemia: Secondary | ICD-10-CM | POA: Diagnosis not present

## 2021-07-10 DIAGNOSIS — Z79899 Other long term (current) drug therapy: Secondary | ICD-10-CM | POA: Diagnosis not present

## 2021-07-10 DIAGNOSIS — Z7902 Long term (current) use of antithrombotics/antiplatelets: Secondary | ICD-10-CM

## 2021-07-10 DIAGNOSIS — Z7982 Long term (current) use of aspirin: Secondary | ICD-10-CM

## 2021-07-10 DIAGNOSIS — I10 Essential (primary) hypertension: Secondary | ICD-10-CM | POA: Diagnosis not present

## 2021-07-10 DIAGNOSIS — Z8673 Personal history of transient ischemic attack (TIA), and cerebral infarction without residual deficits: Secondary | ICD-10-CM

## 2021-07-10 DIAGNOSIS — I771 Stricture of artery: Secondary | ICD-10-CM

## 2021-07-10 DIAGNOSIS — I6521 Occlusion and stenosis of right carotid artery: Secondary | ICD-10-CM | POA: Diagnosis not present

## 2021-07-10 DIAGNOSIS — I63233 Cerebral infarction due to unspecified occlusion or stenosis of bilateral carotid arteries: Secondary | ICD-10-CM | POA: Diagnosis not present

## 2021-07-10 DIAGNOSIS — I69351 Hemiplegia and hemiparesis following cerebral infarction affecting right dominant side: Secondary | ICD-10-CM | POA: Diagnosis not present

## 2021-07-10 HISTORY — PX: RADIOLOGY WITH ANESTHESIA: SHX6223

## 2021-07-10 HISTORY — PX: IR CT HEAD LTD: IMG2386

## 2021-07-10 HISTORY — PX: IR INTRAVSC STENT CERV CAROTID W/EMB-PROT MOD SED INCL ANGIO: IMG2303

## 2021-07-10 LAB — CBC WITH DIFFERENTIAL/PLATELET
Abs Immature Granulocytes: 0.03 10*3/uL (ref 0.00–0.07)
Basophils Absolute: 0 10*3/uL (ref 0.0–0.1)
Basophils Relative: 0 %
Eosinophils Absolute: 0.2 10*3/uL (ref 0.0–0.5)
Eosinophils Relative: 3 %
HCT: 38.8 % (ref 36.0–46.0)
Hemoglobin: 12.5 g/dL (ref 12.0–15.0)
Immature Granulocytes: 1 %
Lymphocytes Relative: 36 %
Lymphs Abs: 2.2 10*3/uL (ref 0.7–4.0)
MCH: 28.7 pg (ref 26.0–34.0)
MCHC: 32.2 g/dL (ref 30.0–36.0)
MCV: 89.2 fL (ref 80.0–100.0)
Monocytes Absolute: 0.7 10*3/uL (ref 0.1–1.0)
Monocytes Relative: 11 %
Neutro Abs: 3.1 10*3/uL (ref 1.7–7.7)
Neutrophils Relative %: 49 %
Platelets: 286 10*3/uL (ref 150–400)
RBC: 4.35 MIL/uL (ref 3.87–5.11)
RDW: 14.9 % (ref 11.5–15.5)
WBC: 6.3 10*3/uL (ref 4.0–10.5)
nRBC: 0 % (ref 0.0–0.2)

## 2021-07-10 LAB — BASIC METABOLIC PANEL
Anion gap: 10 (ref 5–15)
BUN: 18 mg/dL (ref 8–23)
CO2: 27 mmol/L (ref 22–32)
Calcium: 9.7 mg/dL (ref 8.9–10.3)
Chloride: 102 mmol/L (ref 98–111)
Creatinine, Ser: 0.99 mg/dL (ref 0.44–1.00)
GFR, Estimated: 57 mL/min — ABNORMAL LOW (ref >60)
Glucose, Bld: 95 mg/dL (ref 70–99)
Potassium: 4 mmol/L (ref 3.5–5.1)
Sodium: 139 mmol/L (ref 135–145)

## 2021-07-10 LAB — GLUCOSE, CAPILLARY
Glucose-Capillary: 95 mg/dL (ref 70–99)
Glucose-Capillary: 79 mg/dL (ref 70–99)
Glucose-Capillary: 102 mg/dL — ABNORMAL HIGH (ref 70–99)

## 2021-07-10 LAB — PROTIME-INR
INR: 1 (ref 0.8–1.2)
Prothrombin Time: 13.6 seconds (ref 11.4–15.2)

## 2021-07-10 LAB — HEPARIN LEVEL (UNFRACTIONATED): Heparin Unfractionated: <0.1 IU/mL — ABNORMAL LOW (ref 0.30–0.70)

## 2021-07-10 LAB — POCT ACTIVATED CLOTTING TIME
Activated Clotting Time: 196 seconds
Activated Clotting Time: 219 seconds

## 2021-07-10 LAB — MRSA NEXT GEN BY PCR, NASAL: MRSA by PCR Next Gen: NOT DETECTED

## 2021-07-10 LAB — PLATELET INHIBITION P2Y12

## 2021-07-10 LAB — APTT: aPTT: 28 seconds (ref 24–36)

## 2021-07-10 IMAGING — XA IR INTRAVSC STENT CERV CAROTID W/ EMB-PROT
9 of 18 series · 9 of 24 positions shown · IV contrast (IODINE)
Comparison: CT angiogram of the head and neck of [DATE].

INDICATION: Previous history of left cerebral hemisphere stroke with symptomatic
severe left ICA proximally status post endovascular
revascularization.

Also noted severe right internal carotid artery proximal stenosis.
EXAM:
1. EMERGENT LARGE VESSEL OCCLUSION THROMBOLYSIS (anterior
CIRCULATION)
TECHNIQUE: Following a full explanation of the procedure along with the
potential associated complications, an informed witnessed consent
was obtained. The risks of intracranial hemorrhage of 10%, worsening
neurological deficit, ventilator dependency, death and inability to
revascularize were all reviewed in detail with the patient.

[Series 1: cerebral · 2 acquisitions, 1 frame shown (1 of 8)]
[im 1/2]
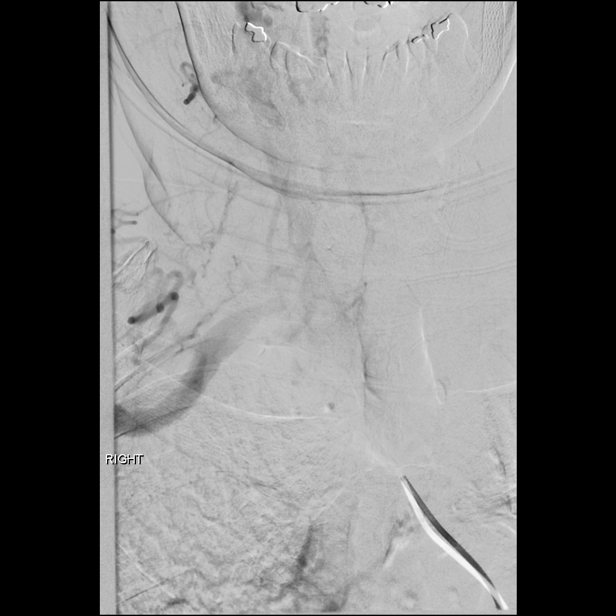

[Series 4: cerebral · 2 acquisitions, 1 frame shown (2 of 8)]
[im 1/2]
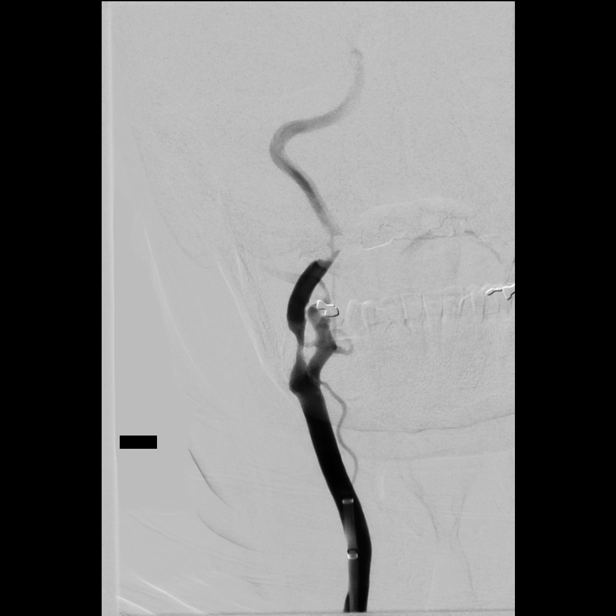

[Series 5: cerebral · 2 acquisitions, 1 frame shown (3 of 8)]
[im 1/2]
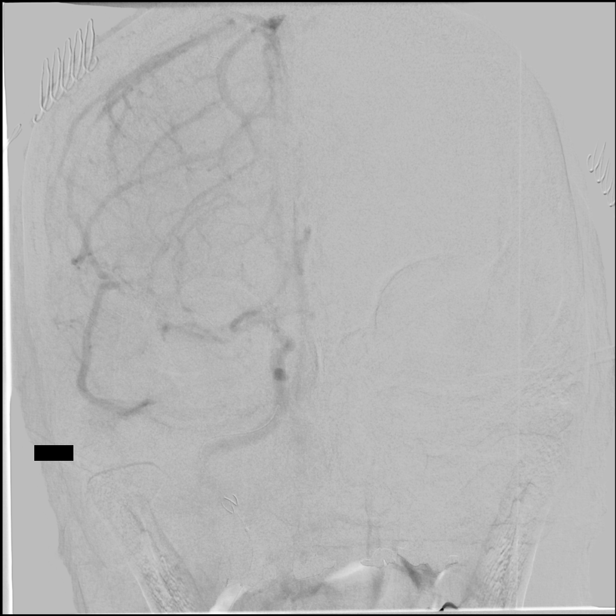

[Series 8: cerebral · 2 acquisitions, 1 frame shown (4 of 8)]
[im 1/2]
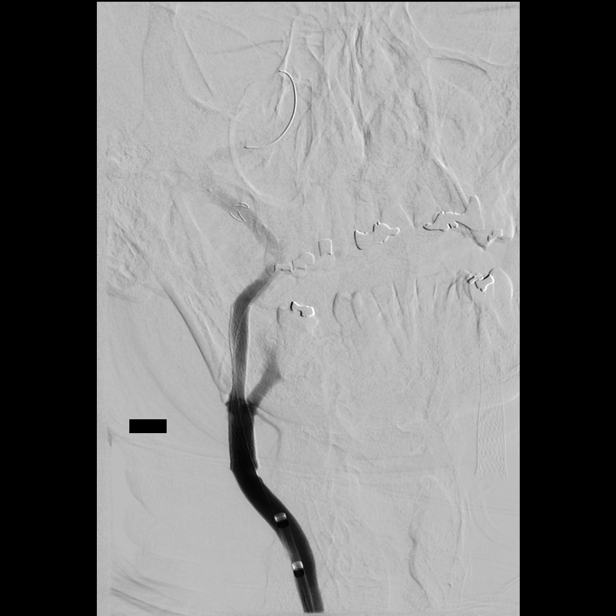

[Series 10: cerebral · 2 acquisitions, 1 frame shown (5 of 8)]
[im 1/2]
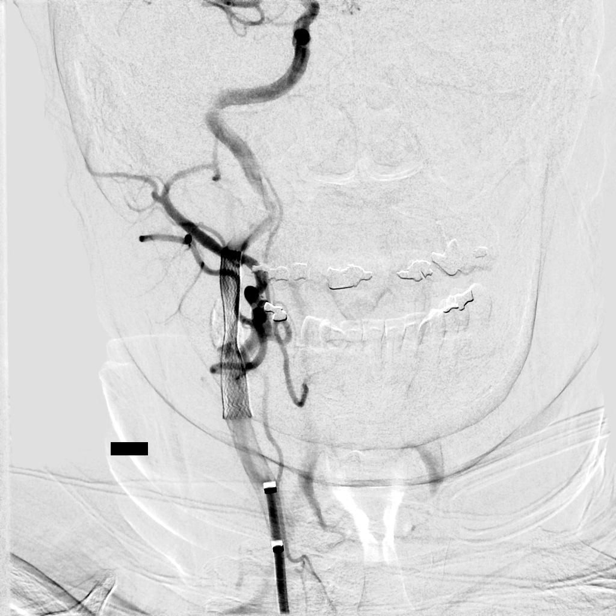

[Series 12: cerebral · 2 acquisitions, 1 frame shown (6 of 8)]
[im 1/2]
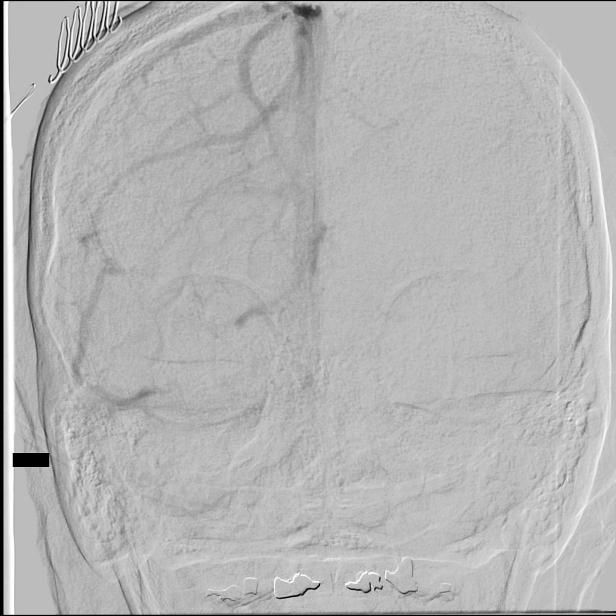

[Series 15: cerebral · 2 acquisitions, 1 frame shown (7 of 8)]
[im 1/2]
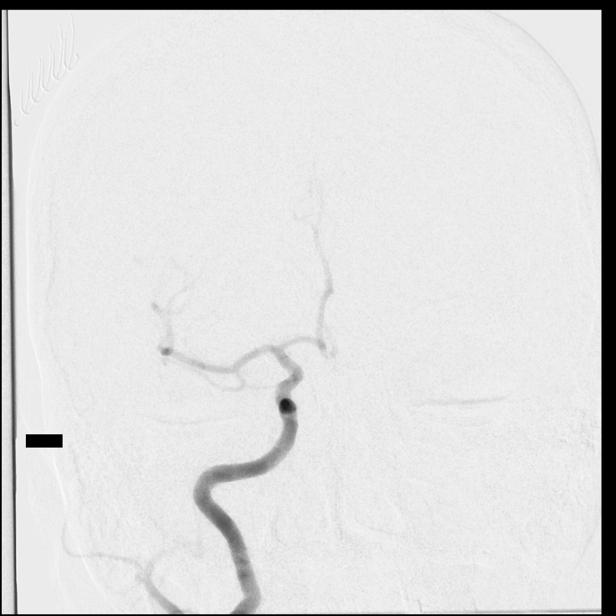

[Series 17: cerebral · 2 acquisitions, 1 frame shown (8 of 8)]
[im 1/2]
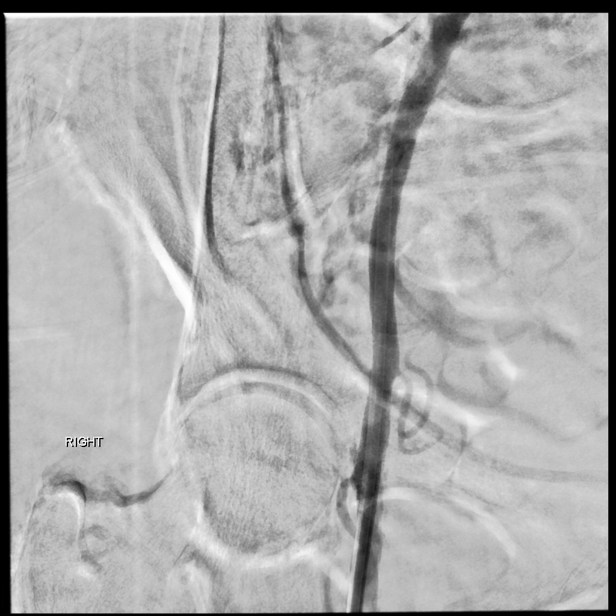

[Series 300: dr. (person_name) · 1 of 170 slices shown]
[im 57/170]
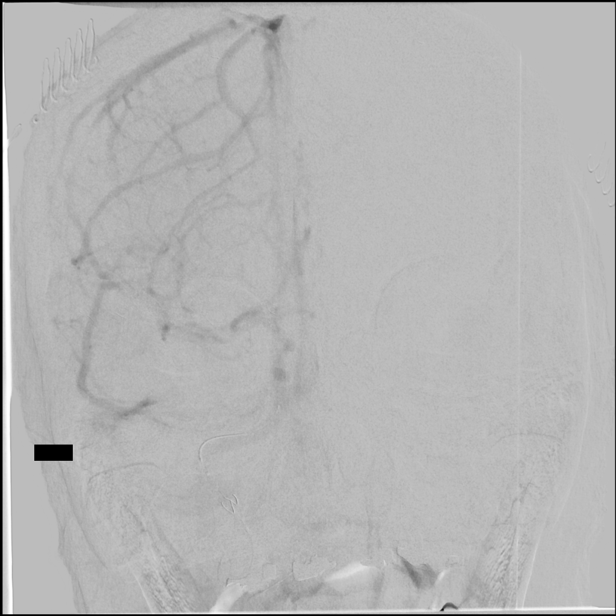

[9 of 24 positions shown; findings below may reference images not displayed]

MEDICATIONS:
Ancef 2 g IV antibiotic was administered within 1 hour of the
procedure.

ANESTHESIA/SEDATION:
Mac anesthesia as per [REDACTED] at [REDACTED].

CONTRAST:  Omnipaque 350 approximately 90 cc.

FLUOROSCOPY TIME:  Fluoroscopy Time: 22 minutes 18 seconds ([IJ]
mGy).

COMPLICATIONS:
None immediate.
The patient was then put under general anesthesia by the [REDACTED] at [HOSPITAL].

The right groin was prepped and draped in the usual sterile fashion.
Thereafter using modified Seldinger technique, transfemoral access
into the right common femoral artery was obtained without
difficulty. Over a 0.035 inch guidewire an 8 French 25 cm Pinnacle
sheath was inserted. Through this, and also over a 0.035 inch
guidewire a 5 French JB 1 catheter was advanced to the aortic arch
region and selectively positioned in the right subclavian artery and
the right common carotid artery.
FINDINGS: The right subclavian arteriogram demonstrates the origin of the
right vertebral artery to be widely patent.

The vessel is seen to opacify to the cranial skull base. On the
lateral projection opacification is seen of the right
vertebrobasilar junction and the right posterior-inferior cerebellar
artery.

More distally opacification is seen of the basilar artery and the
superior cerebellar arteries and suboptimally seen the right
posterior cerebral artery.

The right common carotid arteriogram demonstrates the right external
carotid artery and its major branches to be widely patent.

The right internal carotid artery at the bulb has a smooth shallow
plaque along the posterolateral wall. Distal to this there is a
smooth circumferential plaque with high-grade approximately 90%
stenosis. No intraluminal filling defects or of ulcerations were
noted.

More distally the right internal carotid artery is seen to opacify
to the cranial skull base. The petrous, the cavernous and the
supraclinoid segments are widely patent.

The right middle cerebral artery and the right anterior cerebral
artery opacify into the capillary and venous phases.

PROCEDURE:
Over a 0.035 inch Roadrunner exchange guidewire, an 087 balloon
guide catheter was then advanced and positioned in the proximal
right common carotid artery. The guidewire was removed. Good
aspiration obtained from the hub of the balloon guide catheter. A
gentle control arteriogram performed through this demonstrated no
evidence of spasms, dissections or of intraluminal filling defects.

Measurements were then performed of the right internal carotid
artery at the cranial skull base, in its most normal segment distal
to the stenosis, and also the proximal landing zone in the left
common carotid artery.

A [DATE] Emboshield device was then prepped and purged with heparinized
saline infusion in its housing. The delivery system with the micro
guidewire was then retrieved. A mild J configuration was given to
the distal end of the 014 inch micro guidewire.

Thereafter, using biplane roadmap technique and constant
fluoroscopic guidance in a coaxial manner and with constant
heparinized saline infusion, the filter device system was advanced
to the distal end of the balloon guide catheter.

Using a torque device, access through the severely stenotic site was
first obtained with the micro guidewire followed the filter delivery
system. The wire was advanced into the distal petrous segment. Under
fluoroscopic guidance, the filter was then deployed in the usual
manner in the cervical petrous junction.

The delivery catheter was removed. A control arteriogram performed
through the balloon guide catheter in right common carotid artery
demonstrated good apposition of the filter device in the distal
right internal carotid artery. No evidence of a dissection or spasm
is seen. A 4 mm x 30 mm Viatrac 14 angioplasty balloon catheter was
then prepped and purged antegradely and retrogradely. Using the
rapid exchange technique, the balloon was then advanced under
fluoroscopic guidance and positioned distal and proximal markers
adequate distant from the site of the severe stenosis. A control
angioplasty was then performed using micro inflation syringe device
via micro tubing to nominal pressure for 8 atmospheres where it was
maintained for approximately 40 seconds. The balloon was then
deflated and retrieved and removed while maintaining the filter
device in stable position distally.

Control arteriogram performed through the balloon guide in the right
common carotid artery demonstrated modestly improved caliber of the
angioplastied segment of the stenosed lesion.

An Xact 6-8mm x 40 mm stent delivery system was then retrogradely
purged with heparinized saline infusion.

Using the rapid exchange technique, the stent delivery system was
advanced to the angioplastied segment, with the distal and proximal
markers positioned adequate distance. The stent was then deployed in
the usual manner without any difficulties. With the filter in the
distal location, the stent delivery system was removed.

A control arteriogram performed through the balloon guide catheter
demonstrated excellent apposition with the improved caliber though
with persistent waist at the site of severe stenosis.

This prompted the post stent deployment angioplasty with a 5 mm x 30
mm 14 Viatrac balloon which had been prepped with heparinized saline
infusion in 50% contrast and 50% heparinized saline infusion. This
again was advanced in the rapid exchange technique position
optimally at the site of the waist. Control angiogram was then
performed to just above the 8 atmospheres where it was maintained
for approximately 40 seconds. The balloon was then deflated and
retrieved and removed. A control arteriogram performed through the
balloon guide now demonstrated excellent apposition significantly
improved caliber and flow of the previous noted waist at the stent.

Intracranially the middle and the anterior cerebral arteries
demonstrate wide patency without evidence of intraluminal filling
defects.

The filter capture device was then advanced under constant
fluoroscopic guidance with its distal end engaged at the proximal
aspect of the filter device. Filter device was captured into the
catheter and retrieved and removed with constant observation without
any entanglement with the stent.

Control arteriograms were then performed at 15 and 40 minutes post
second angioplasty. These continued to demonstrate excellent
apposition of the revascularized right internal carotid proximally.
No evidence of intraluminal filling defects or irregularity was seen
within the stent. More distally intracranially no evidence of
intraluminal filling defects or of occlusions were seen.

The patient was also given a total of approximately 6 mg of
Integrilin intra-arterially in order to prevent platelet aggregation
within stent.

None was observed.

The balloon guide was then retrieved and removed. The 8 French
Pinnacle sheath was removed with successful hemostasis with an 8
French Angio-Seal closure device. The right groin appeared soft.
Distal pulses remained Dopplerable in both feet without change.

Neurologically the patient complained of no headaches, nausea or
vomiting.

CT of the brain on table demonstrated no mass effect or intracranial
hemorrhage.

She was then transferred to PACU and neuro ICU thereafter for post
treatment close neurologic monitoring, include blood pressure
control, and low-dose IV heparin.

Overnight, the patient remained stable neurologically and
systemically.

The following morning, the IV heparin was stopped and patient was
switched to aspirated 81 mg a day, and Plavix 75 mg a day. Her home
meds were started.

Neurologically she remained stable with only minimal hand drift on
the right. Continued to demonstrate occasional word-finding
difficulties and hesitancy unchanged from prior to procedure.

The right groin appeared soft. She was then ambulated initially with
assistance and then independently.

Patient was able to tolerate solids and liquids.

She was then discharged home under the care of her son. Patient was
advised specifically to refrain from stooping, bending or lifting
weights above 10 pounds for 2 weeks. She was she was advised not to
drive for a couple of weeks. Also patient was advised to continue
taking her medications without fail. The patient expressed
understanding and agreement with the above management plan.
IMPRESSION: Status post endovascular revascularization of severely stenotic
proximal right internal carotid artery with stent assisted
angioplasty and distal protection as described without event.

PLAN:
Return for follow-up in the clinic in 2 weeks time.

## 2021-07-10 SURGERY — IR WITH ANESTHESIA
Anesthesia: General

## 2021-07-10 MED ORDER — CLEVIDIPINE BUTYRATE 0.5 MG/ML IV EMUL
INTRAVENOUS | Status: DC | PRN
Start: 2021-07-10 — End: 2021-07-10
  Administered 2021-07-10: 2 mg/h via INTRAVENOUS

## 2021-07-10 MED ORDER — CHLORHEXIDINE GLUCONATE CLOTH 2 % EX PADS: 6.0000 | MEDICATED_PAD | Freq: Every day | CUTANEOUS | Status: DC

## 2021-07-10 MED ORDER — CLOPIDOGREL BISULFATE 75 MG PO TABS: 75.0000 mg | ORAL_TABLET | ORAL | Status: DC

## 2021-07-10 MED ORDER — PROTAMINE SULFATE 10 MG/ML IV SOLN
INTRAVENOUS | Status: DC | PRN
  Administered 2021-07-10: 5 mg via INTRAVENOUS

## 2021-07-10 MED ORDER — CLOPIDOGREL BISULFATE 75 MG PO TABS: 75.0000 mg | ORAL_TABLET | Freq: Every day | ORAL | Status: DC

## 2021-07-10 MED ORDER — ORAL CARE MOUTH RINSE: 15.0000 mL | Freq: Once | OROMUCOSAL | Status: AC

## 2021-07-10 MED ORDER — IOHEXOL 350 MG/ML SOLN
100.0000 mL | Freq: Once | INTRAVENOUS | Status: AC | PRN
  Administered 2021-07-10: 51 mL via INTRAVENOUS

## 2021-07-10 MED ORDER — HEPARIN (PORCINE) 25000 UT/250ML-% IV SOLN
650.0000 [IU]/h | INTRAVENOUS | Status: DC
  Filled 2021-07-10: qty 250

## 2021-07-10 MED ORDER — EPTIFIBATIDE 20 MG/10ML IV SOLN
INTRAVENOUS | Status: DC | PRN
Start: 2021-07-10 — End: 2021-07-11
  Administered 2021-07-10 (×2): 1.5 mg via INTRA_ARTERIAL
  Administered 2021-07-10: 6 mg via INTRA_ARTERIAL

## 2021-07-10 MED ORDER — SODIUM CHLORIDE 0.9 % IV SOLN: INTRAVENOUS | Status: DC

## 2021-07-10 MED ORDER — ASPIRIN 81 MG PO CHEW
81.0000 mg | CHEWABLE_TABLET | Freq: Every day | ORAL | Status: DC
  Administered 2021-07-11: 81 mg via ORAL
  Filled 2021-07-10: qty 1

## 2021-07-10 MED ORDER — NITROGLYCERIN 1 MG/10 ML FOR IR/CATH LAB
INTRA_ARTERIAL | Status: AC
  Filled 2021-07-10: qty 10

## 2021-07-10 MED ORDER — NIMODIPINE 30 MG PO CAPS
0.0000 mg | ORAL_CAPSULE | ORAL | Status: AC
Start: 2021-07-10 — End: 2021-07-10
  Administered 2021-07-10: 30 mg via ORAL
  Filled 2021-07-10: qty 1

## 2021-07-10 MED ORDER — SODIUM CHLORIDE 0.9 % IV SOLN
0.0000 ug/min | INTRAVENOUS | Status: DC
  Administered 2021-07-10: 10 ug/min via INTRAVENOUS

## 2021-07-10 MED ORDER — GLYCOPYRROLATE PF 0.2 MG/ML IJ SOSY
PREFILLED_SYRINGE | INTRAMUSCULAR | Status: DC | PRN
  Administered 2021-07-10 (×2): .1 mg via INTRAVENOUS

## 2021-07-10 MED ORDER — FENTANYL CITRATE (PF) 100 MCG/2ML IJ SOLN
INTRAMUSCULAR | Status: DC | PRN
  Administered 2021-07-10: 25 ug via INTRAVENOUS

## 2021-07-10 MED ORDER — CEFAZOLIN SODIUM-DEXTROSE 2-4 GM/100ML-% IV SOLN
2.0000 g | INTRAVENOUS | Status: AC
  Administered 2021-07-10: 2 g via INTRAVENOUS

## 2021-07-10 MED ORDER — LIDOCAINE HCL 1 % IJ SOLN
INTRAMUSCULAR | Status: AC
  Filled 2021-07-10: qty 20

## 2021-07-10 MED ORDER — HEPARIN (PORCINE) 25000 UT/250ML-% IV SOLN
INTRAVENOUS | Status: AC
  Filled 2021-07-10: qty 250

## 2021-07-10 MED ORDER — ACETAMINOPHEN 650 MG RE SUPP: 650.0000 mg | RECTAL | Status: DC | PRN

## 2021-07-10 MED ORDER — CHLORHEXIDINE GLUCONATE 0.12 % MT SOLN: 15.0000 mL | Freq: Once | OROMUCOSAL | Status: AC

## 2021-07-10 MED ORDER — PHENYLEPHRINE HCL-NACL 20-0.9 MG/250ML-% IV SOLN
INTRAVENOUS | Status: DC | PRN
Start: 2021-07-10 — End: 2021-07-10
  Administered 2021-07-10: 20 ug/min via INTRAVENOUS

## 2021-07-10 MED ORDER — CLOPIDOGREL BISULFATE 75 MG PO TABS
75.0000 mg | ORAL_TABLET | Freq: Every day | ORAL | Status: DC
  Administered 2021-07-11: 75 mg via ORAL
  Filled 2021-07-10: qty 1

## 2021-07-10 MED ORDER — CEFAZOLIN SODIUM-DEXTROSE 2-4 GM/100ML-% IV SOLN
INTRAVENOUS | Status: AC
  Filled 2021-07-10: qty 100

## 2021-07-10 MED ORDER — ASPIRIN 81 MG PO CHEW: 81.0000 mg | CHEWABLE_TABLET | Freq: Every day | ORAL | Status: DC

## 2021-07-10 MED ORDER — ONDANSETRON HCL 4 MG/2ML IJ SOLN
INTRAMUSCULAR | Status: DC | PRN
  Administered 2021-07-10: 4 mg via INTRAVENOUS

## 2021-07-10 MED ORDER — ASPIRIN EC 325 MG PO TBEC: 325.0000 mg | DELAYED_RELEASE_TABLET | ORAL | Status: DC

## 2021-07-10 MED ORDER — PHENYLEPHRINE HCL (PRESSORS) 10 MG/ML IV SOLN: INTRAVENOUS | Status: DC | PRN

## 2021-07-10 MED ORDER — HEPARIN SODIUM (PORCINE) 1000 UNIT/ML IJ SOLN
INTRAMUSCULAR | Status: DC | PRN
  Administered 2021-07-10: 1000 [IU] via INTRAVENOUS
  Administered 2021-07-10: 3000 [IU] via INTRAVENOUS

## 2021-07-10 MED ORDER — EPTIFIBATIDE 20 MG/10ML IV SOLN
INTRAVENOUS | Status: AC
  Filled 2021-07-10: qty 10

## 2021-07-10 MED ORDER — LIDOCAINE HCL (PF) 1 % IJ SOLN
INTRAMUSCULAR | Status: DC | PRN
  Administered 2021-07-10: 10 mL

## 2021-07-10 MED ORDER — HEPARIN (PORCINE) 25000 UT/250ML-% IV SOLN
500.0000 [IU]/h | INTRAVENOUS | Status: DC
  Administered 2021-07-10: 500 [IU]/h via INTRAVENOUS

## 2021-07-10 MED ORDER — ACETAMINOPHEN 325 MG PO TABS: 650.0000 mg | ORAL_TABLET | ORAL | Status: DC | PRN

## 2021-07-10 MED ORDER — CLEVIDIPINE BUTYRATE 0.5 MG/ML IV EMUL
INTRAVENOUS | Status: AC
  Filled 2021-07-10: qty 50

## 2021-07-10 MED ORDER — PHENYLEPHRINE 40 MCG/ML (10ML) SYRINGE FOR IV PUSH (FOR BLOOD PRESSURE SUPPORT)
PREFILLED_SYRINGE | INTRAVENOUS | Status: DC | PRN
  Administered 2021-07-10 (×3): 80 ug via INTRAVENOUS
  Administered 2021-07-10: 40 ug via INTRAVENOUS
  Administered 2021-07-10: 80 ug via INTRAVENOUS
  Administered 2021-07-10: 40 ug via INTRAVENOUS
  Administered 2021-07-10 (×2): 80 ug via INTRAVENOUS

## 2021-07-10 MED ORDER — ACETAMINOPHEN 160 MG/5ML PO SOLN: 650.0000 mg | ORAL | Status: DC | PRN

## 2021-07-10 MED ORDER — PHENYLEPHRINE HCL-NACL 20-0.9 MG/250ML-% IV SOLN: INTRAVENOUS | Status: DC | PRN

## 2021-07-10 MED ORDER — CHLORHEXIDINE GLUCONATE 0.12 % MT SOLN
OROMUCOSAL | Status: AC
  Administered 2021-07-10: 15 mL via OROMUCOSAL
  Filled 2021-07-10: qty 15

## 2021-07-10 MED ORDER — CLEVIDIPINE BUTYRATE 0.5 MG/ML IV EMUL: 0.0000 mg/h | INTRAVENOUS | Status: DC

## 2021-07-10 MED ORDER — DEXMEDETOMIDINE HCL IN NACL 200 MCG/50ML IV SOLN
INTRAVENOUS | Status: DC | PRN
  Administered 2021-07-10: 4 ug via INTRAVENOUS

## 2021-07-10 MED ORDER — FENTANYL CITRATE (PF) 100 MCG/2ML IJ SOLN
INTRAMUSCULAR | Status: AC
  Filled 2021-07-10: qty 2

## 2021-07-10 NOTE — Progress Notes (Signed)
Patient assessed in PACU following today's NIR procedure with stent-assisted angioplasty of R ICA. Patient is awake and alert, she follows all commands and denies any pain/discomfort. Strength is equal bilaterally, coordination intact. She did demonstrate some word-finding difficulties but this is similar to baseline.   Right groin vascular site is soft, non-tender, clean and dry. She will be admitted to the Neuro ICU for overnight observation with tentative discharge home tomorrow. ICU bed placement is pending. Her son has been updated by Dr. Corliss Skains.   Alwyn Ren, [place] [phone] 07/10/2021, 3:41 PM

## 2021-07-10 NOTE — H&P (Signed)
Chief Complaint: Patient was seen in consultation today for Cerebral arteriogram with possible Right internal carotid artery angioplasty/stent placement at the request of Dr Jerel Shepherd   Supervising Physician: Julieanne Cotton  Patient Status: [facility] - Out-pt  History of Present Illness: Sharon Gilbert is a 80 y.o. female   Known to NIR Previous CVA 06/2020 L ICA angioplasty/stent 07/04/20 Has done well Right side still some weakness nut improving and using daily Speech is much better  No other complaints Denies vision changes Denies headache or tingling No gait issues- does not use cane or help  Follow up-- CTA 4/22: IMPRESSION: 1. Chronic severe stenosis at the proximal right ICA due to irregular plaque. 2. New (from 2021) advanced narrowing and irregularity of the left M1 segment. 3. Diffusely patent left cervical ICA stent. 4. High-grade narrowing at the non dominant left vertebral origin due to atherosclerotic plaque.  Consult with Dr Corliss Skains 02/06/21: ASSESSMENT AND PLAN: The findings on the recent CT angiogram were reviewed with her. It does show high-grade stenosis of the right internal carotid proximally. However, good distal antegrade flow is seen filling the middle cerebral artery and the anterior cerebral artery distributions.    The previously positioned left internal carotid artery stent is widely patent.     Patient advised to continue with her occupational therapy and speech therapy. Plan diagnostic catheter arteriogram in a months time with the intent to treat the right internal carotid artery stenosis with stent assisted angioplasty. Questions were answered to their satisfaction. They leave with good understanding of the management plan.  [id number] 33 on 07/02/21 Taking Plavix 75 mg and ASA 325 mg daily  Past Medical History:  Diagnosis Date   Cataract    Diabetes mellitus without complication [facility])    pt son denies pt being diabetic 07/09/21    Hypertension    Stroke [facility])     Past Surgical History:  Procedure Laterality Date   IR CT HEAD LTD  07/04/2020   IR INTRAVSC STENT CERV CAROTID W/O EMB-PROT MOD SED INC ANGIO  07/04/2020   IR PERCUTANEOUS ART THROMBECTOMY/INFUSION INTRACRANIAL INC DIAG ANGIO  07/04/2020   IR RADIOLOGIST EVAL & MGMT  10/01/2020   IR RADIOLOGIST EVAL & MGMT  02/07/2021   RADIOLOGY WITH ANESTHESIA N/A 07/04/2020   Procedure: IR WITH ANESTHESIA;  Surgeon: Radiologist, Medication, MD;  Location: [facility] OR;  Service: Radiology;  Laterality: N/A;    Allergies: Lipitor [atorvastatin] and Topiramate  Medications: Prior to Admission medications   Medication Sig Start Date End Date Taking? Authorizing Provider  aspirin 325 MG tablet Take 325 mg by mouth in the morning.   Yes [provider]  atorvastatin (LIPITOR) 80 MG tablet Take 1 tablet (80 mg total) by mouth daily at 6 PM. 07/26/20  Yes Love, Evlyn Kanner, PA-C  clopidogrel (PLAVIX) 75 MG tablet Take 75 mg by mouth in the morning. 08/01/20  Yes [provider]  dorzolamide-timolol (COSOPT) 22.3-6.8 MG/ML ophthalmic solution Place 1 drop into both eyes 2 (two) times daily. 07/11/20  Yes Layne Benton, NP  latanoprost (XALATAN) 0.005 % ophthalmic solution Place 1 drop into both eyes at bedtime. 07/11/20  Yes Layne Benton, NP  lisinopril (ZESTRIL) 10 MG tablet Take 0.5 tablets (5 mg total) by mouth daily. Patient taking differently: Take 10 mg by mouth in the morning. 09/17/20  Yes Marcello Fennel, MD     History reviewed. No pertinent family history.  Social History   Socioeconomic History  Marital status: Married    Spouse name: Not on file   Number of children: Not on file   Years of education: Not on file   Highest education level: Not on file  Occupational History   Not on file  Tobacco Use   Smoking status: Never   Smokeless tobacco: Never  Vaping Use   Vaping Use: Never used  Substance and Sexual Activity   Alcohol use: No    Drug use: No   Sexual activity: Yes  Other Topics Concern   Not on file  Social History Narrative   Lives with son   Right Handed   Drinks caffeine occassionally   Social Determinants of Health   Financial Resource Strain: Not on file  Food Insecurity: Not on file  Transportation Needs: Not on file  Physical Activity: Not on file  Stress: Not on file  Social Connections: Not on file    Review of Systems: A 12 point ROS discussed and pertinent positives are indicated in the HPI above.  All other systems are negative.  Review of Systems  Constitutional:  Negative for activity change, fatigue, fever and unexpected weight change.  HENT:  Negative for hearing loss, sore throat, trouble swallowing and voice change.   Eyes:  Negative for visual disturbance.  Respiratory:  Negative for cough and shortness of breath.   Gastrointestinal:  Negative for abdominal pain, diarrhea, nausea and vomiting.  Musculoskeletal:  Negative for back pain and gait problem.  Neurological:  Positive for speech difficulty and weakness. Negative for dizziness, tremors, seizures, syncope, facial asymmetry, light-headedness, numbness and headaches.  Psychiatric/Behavioral:  Negative for behavioral problems and confusion.    Vital Signs: BP (!) 176/65   Pulse 81   Temp 98.2 F (36.8 C) (Oral)   Resp 20   Ht 5\' 3"  (1.6 m)   Wt 130 lb (59 kg)   SpO2 100%   BMI 23.03 kg/m   Physical Exam Vitals reviewed.  HENT:     Mouth/Throat:     Mouth: Mucous membranes are moist.  Eyes:     Extraocular Movements: Extraocular movements intact.  Cardiovascular:     Rate and Rhythm: Normal rate. Rhythm irregular.     Heart sounds: Normal heart sounds.  Pulmonary:     Effort: Pulmonary effort is normal.     Breath sounds: Normal breath sounds.  Abdominal:     Palpations: Abdomen is soft.     Tenderness: There is no abdominal tenderness.  Musculoskeletal:        General: Normal range of motion.     Cervical  back: Normal range of motion.     Right lower leg: No edema.     Left lower leg: No edema.  Skin:    General: Skin is warm.  Neurological:     Mental Status: She is alert and oriented to person, place, and time.  Psychiatric:        Behavior: Behavior normal.        Thought Content: Thought content normal.        Judgment: Judgment normal.    Imaging: No results found.  Labs:  CBC: Recent Labs    07/12/20 0626 07/16/20 0653 07/23/20 0706  WBC 6.3 7.4 7.1  HGB 10.9* 10.6* 10.9*  HCT 33.2* 33.0* 33.7*  PLT 375 427* 456*    COAGS: No results for input(s): INR, APTT in the last 8760 hours.  BMP: Recent Labs    07/12/20 0626 07/16/20 0653 07/20/20 0440 07/23/20  0706 01/25/21 1634  NA 137 139 136 139  --   K 3.5 3.8 3.5 3.9  --   CL 104 105 104 105  --   CO2 21* 22 22 22   --   GLUCOSE 107* 113* 112* 120*  --   BUN 9 18 12 9   --   CALCIUM 9.2 9.4 8.9 9.2  --   CREATININE 0.89 0.84 0.75 0.82 0.80  GFRNONAA >60 >60 >60 >60  --     LIVER FUNCTION TESTS: Recent Labs    07/12/20 0626 07/16/20 0653  BILITOT 0.9 0.9  AST 36 29  ALT 45* 38  ALKPHOS 53 54  PROT 6.8 7.0  ALBUMIN 3.3* 3.2*    TUMOR MARKERS: No results for input(s): AFPTM, CEA, CA199, CHROMGRNA in the last 8760 hours.  Assessment and Plan:  CVA 06/2020 L ICA angioplasty/stent in NIR 07/04/20 Doing well-- improving still with speech and OT CTA follow up 01/2021 revealing R ICA stenosis Scheduled for treatment of same  Risks and benefits of cerebral angiogram with intervention were discussed with the patient including, but not limited to bleeding, infection, vascular injury, contrast induced renal failure, stroke or even death.  This interventional procedure involves the use of X-rays and because of the nature of the planned procedure, it is possible that we will have prolonged use of X-ray fluoroscopy.  Potential radiation risks to you include (but are not limited to) the following: - A  slightly elevated risk for cancer  several years later in life. This risk is typically less than 0.5% percent. This risk is low in comparison to the normal incidence of human cancer, which is 33% for women and 50% for men according to the [employer] Society. - Radiation induced injury can include skin redness, resembling a rash, tissue breakdown / ulcers and hair loss (which can be temporary or permanent).   The likelihood of either of these occurring depends on the difficulty of the procedure and whether you are sensitive to radiation due to previous procedures, disease, or genetic conditions.   IF your procedure requires a prolonged use of radiation, you will be notified and given written instructions for further action.  It is your responsibility to monitor the irradiated area for the 2 weeks following the procedure and to notify your physician if you are concerned that you have suffered a radiation induced injury.    All of the patient's questions were answered, patient is agreeable to proceed.  Consent signed and in chart.  Pt is aware if intervention is performed--- she will be admitted overnight for 24 observation. Plan doe DC in am She is agreeable   Thank you for this interesting consult.  I greatly enjoyed meeting Sharon Gilbert and look forward to participating in their care.  A copy of this report was sent to the requesting provider on this date.  Electronically Signed: 02/2021, PA-C 07/10/2021, 7:29 AM   I spent a total of    25 Minutes in face to face in clinical consultation, greater than 50% of which was counseling/coordinating care for cerebral arteriogram with R ICA treatment

## 2021-07-10 NOTE — Sedation Documentation (Signed)
8 Fr angioseal deployed  

## 2021-07-10 NOTE — Sedation Documentation (Signed)
ACT 196. Dr. Corliss Skains notified

## 2021-07-10 NOTE — Progress Notes (Signed)
PACU RN called to let us know right groin was bleeding and she had held pressure for about 10 minutes post angioseal. Went to PACU and assessed right groin bleeding, seemed to be a slow track bleed.  Applied occlusive pressure with quikclot pad for 1 minute, applied small pressure dressing with 2 tegaderms.  No bleeding noted. Wrote on dressing, also advised RN that quikclot would need to be removed in 24 hours.  No hematoma or bleeding present upon [place] leaving PACU. Rosanna Bickle  RTR Virgel Manifold  RTR

## 2021-07-10 NOTE — Sedation Documentation (Signed)
Soft cervical collar applied per Dr. Erlene Senters

## 2021-07-10 NOTE — Anesthesia Procedure Notes (Signed)
Arterial Line Insertion Start/End10/02/2021 8:15 AM, 07/10/2021 8:20 AM Performed by: CRNA  Patient location: OOR procedure area. Preanesthetic checklist: patient identified, IV checked, site marked, risks and benefits discussed, surgical consent, monitors and equipment checked, pre-op evaluation, timeout performed and anesthesia consent Lidocaine 1% used for infiltration Left, radial was placed Catheter size: 20 G Hand hygiene performed , maximum sterile barriers used  and Seldinger technique used Allen's test indicative of satisfactory collateral circulation Attempts: 2 Procedure performed without using ultrasound guided technique. Following insertion, dressing applied and Biopatch. Post procedure assessment: normal  Patient tolerated the procedure well with no immediate complications.

## 2021-07-10 NOTE — Transfer of Care (Signed)
Immediate Anesthesia Transfer of Care Note  Patient: Sharon Gilbert  Procedure(s) Performed: STENTING  Patient Location: PACU  Anesthesia Type:MAC  Level of Consciousness: awake and patient cooperative  Airway & Oxygen Therapy: Patient Spontanous Breathing and Patient connected to nasal cannula oxygen  Post-op Assessment: Report given to RN and Post -op Vital signs reviewed and stable  Post vital signs: Reviewed and stable  Last Vitals:  Vitals Value Taken Time  BP 95/32 07/10/21 1101  Temp    Pulse 95 07/10/21 1113  Resp 14 07/10/21 1113  SpO2 98 % 07/10/21 1113  Vitals shown include unvalidated device data.  Last Pain:  Vitals:   07/10/21 0718  TempSrc:   PainSc: 0-No pain      Patients Stated Pain Goal: 0 (76/28/31 5176)  Complications: No notable events documented.

## 2021-07-10 NOTE — Progress Notes (Signed)
ANTICOAGULATION CONSULT NOTE - Initial Consult  Pharmacy Consult for heparin Indication:  Post neuro intervention  Allergies  Allergen Reactions   Lipitor [Atorvastatin] Other (See Comments)    Muscle pain   Topiramate Other (See Comments)    Vomiting & Wheezing    Patient Measurements: Height: 5\' 3"  (160 cm) Weight: 59 kg (130 lb) IBW/kg (Calculated) : 52.4 Heparin Dosing Weight: 59 kg   Vital Signs: Temp: 98.2 F (36.8 C) (10/05 0656) Temp Source: Oral (10/05 0656) BP: 176/65 (10/05 0656) Pulse Rate: 81 (10/05 0656)  Labs: Recent Labs    07/10/21 0641  HGB 12.5  HCT 38.8  PLT 286  APTT 28  LABPROT 13.6  INR 1.0  CREATININE 0.99    Estimated Creatinine Clearance: 36.9 mL/min (by C-G formula based on SCr of 0.99 mg/dL).   Medical History: Past Medical History:  Diagnosis Date   Cataract    Diabetes mellitus without complication [facility])    pt son denies pt being diabetic 07/09/21   Hypertension    Stroke [facility])     Medications:  Medications Prior to Admission  Medication Sig Dispense Refill Last Dose   aspirin 325 MG tablet Take 325 mg by mouth in the morning.   07/10/2021 at 0545   atorvastatin (LIPITOR) 80 MG tablet Take 1 tablet (80 mg total) by mouth daily at 6 PM. 60 tablet 0 07/09/2021   clopidogrel (PLAVIX) 75 MG tablet Take 75 mg by mouth in the morning.   07/10/2021 at 0545   dorzolamide-timolol (COSOPT) 22.3-6.8 MG/ML ophthalmic solution Place 1 drop into both eyes 2 (two) times daily. 10 mL 12 07/09/2021   latanoprost (XALATAN) 0.005 % ophthalmic solution Place 1 drop into both eyes at bedtime. 2.5 mL 12 07/09/2021   lisinopril (ZESTRIL) 10 MG tablet Take 0.5 tablets (5 mg total) by mouth daily. (Patient taking differently: Take 10 mg by mouth in the morning.) 30 tablet 0 07/09/2021    Assessment: 79 YOF s/p Rt ICA revascularization for severe proximal stenosis with stent assisted angioplastly. Pharmacy consulted to assist in dosing IV heparin.   H/H,  Plt and SCr wnl   Goal of Therapy:  Heparin level 0.1-0.25 units/ml Monitor platelets by anticoagulation protocol: Yes   Plan:  -Start heparin infusion at 500 units/hr. No bolus  -F/u 8 hr HL -Monitor daily HL, CBC and s/s of bleeding   11-26-1974, PharmD., BCPS, BCCCP Clinical Pharmacist Please refer to [facility] for unit-specific pharmacist

## 2021-07-10 NOTE — Progress Notes (Signed)
ANTICOAGULATION CONSULT NOTE   Pharmacy Consult for heparin Indication:  Post neuro intervention  Allergies  Allergen Reactions   Lipitor [Atorvastatin] Other (See Comments)    Muscle pain   Topiramate Other (See Comments)    Vomiting & Wheezing    Patient Measurements: Height: 5\' 3"  (160 cm) Weight: 59 kg (130 lb) IBW/kg (Calculated) : 52.4 Heparin Dosing Weight: 59 kg   Vital Signs: Temp: 97.9 F (36.6 C) (10/05 2000) Temp Source: Oral (10/05 2000) BP: 106/52 (10/05 1615) Pulse Rate: 54 (10/05 2200)  Labs: Recent Labs    07/10/21 0641 07/10/21 2104  HGB 12.5  --   HCT 38.8  --   PLT 286  --   APTT 28  --   LABPROT 13.6  --   INR 1.0  --   HEPARINUNFRC  --  <0.10*  CREATININE 0.99  --      Estimated Creatinine Clearance: 36.9 mL/min (by C-G formula based on SCr of 0.99 mg/dL).   Medical History: Past Medical History:  Diagnosis Date   Cataract    Diabetes mellitus without complication [facility])    pt son denies pt being diabetic 07/09/21   Hypertension    Stroke [facility])     Medications:  Medications Prior to Admission  Medication Sig Dispense Refill Last Dose   aspirin 325 MG tablet Take 325 mg by mouth in the morning.   07/10/2021 at 0545   atorvastatin (LIPITOR) 80 MG tablet Take 1 tablet (80 mg total) by mouth daily at 6 PM. 60 tablet 0 07/09/2021   clopidogrel (PLAVIX) 75 MG tablet Take 75 mg by mouth in the morning.   07/10/2021 at 0545   dorzolamide-timolol (COSOPT) 22.3-6.8 MG/ML ophthalmic solution Place 1 drop into both eyes 2 (two) times daily. 10 mL 12 07/09/2021   latanoprost (XALATAN) 0.005 % ophthalmic solution Place 1 drop into both eyes at bedtime. 2.5 mL 12 07/09/2021   lisinopril (ZESTRIL) 10 MG tablet Take 0.5 tablets (5 mg total) by mouth daily. (Patient taking differently: Take 10 mg by mouth in the morning.) 30 tablet 0 07/09/2021    Assessment: 79 YOF s/p Rt ICA revascularization for severe proximal stenosis with stent assisted  angioplastly. Pharmacy consulted to assist in dosing IV heparin.  -heparin level < 0.1   Goal of Therapy:  Heparin level 0.1-0.25 units/ml Monitor platelets by anticoagulation protocol: Yes   Plan:  -Increase heparin to 650 units/hr -no further heparin levels as heparin to be off at 8am on 8/6  10/6, PharmD Clinical Pharmacist **Pharmacist phone directory can now be found on [email or web address] ([email or web address]).  Listed under [facility].

## 2021-07-10 NOTE — Anesthesia Postprocedure Evaluation (Signed)
Anesthesia Post Note  Patient: Sharon Gilbert  Procedure(s) Performed: STENTING     Patient location during evaluation: PACU Anesthesia Type: General Level of consciousness: awake and alert Pain management: pain level controlled Vital Signs Assessment: post-procedure vital signs reviewed and stable Respiratory status: spontaneous breathing, nonlabored ventilation and respiratory function stable Cardiovascular status: blood pressure returned to baseline and stable Postop Assessment: no apparent nausea or vomiting Anesthetic complications: no   No notable events documented.  Last Vitals:  Vitals:   07/10/21 1345 07/10/21 1415  BP: (!) 107/56 112/60  Pulse: (!) 56 (!) 59  Resp: 15 16  Temp:    SpO2: 100% 100%    Last Pain:  Vitals:   07/10/21 1415  TempSrc:   PainSc: Asleep                 Cecile Hearing

## 2021-07-10 NOTE — Sedation Documentation (Signed)
Patient transported to PACU with CRNA Tresa Endo. Mackenzie RN at the bedside to receive report. Groin site assessed. Site in pink,warm and dry. No drainage from dressing. Soft to touch upon palpation, no hematoma noted. Patient is alert and oriented following commands and moving all extremities appropriately.

## 2021-07-10 NOTE — Anesthesia Procedure Notes (Signed)
Procedure Name: MAC Date/Time: 07/10/2021 8:50 AM Performed by: Renato Shin, CRNA Pre-anesthesia Checklist: Patient identified, Emergency Drugs available, Suction available and Patient being monitored Patient Re-evaluated:Patient Re-evaluated prior to induction Oxygen Delivery Method: Nasal cannula Preoxygenation: Pre-oxygenation with 100% oxygen Placement Confirmation: positive ETCO2 and breath sounds checked- equal and bilateral Dental Injury: Teeth and Oropharynx as per pre-operative assessment

## 2021-07-10 NOTE — Procedures (Signed)
INR  S/P RT ICA revascularization for severe prox stenosis with stent assisted angioplasty and distal protection. Post CT brain No ICH.65F angioseal for RT CFA hemostasis. Dopplerable distal pulses bilaterally. Patient denies any H/As,N/V or visual changes. Neuro stable with mild RT UE drift . No change in occasional word finding difficulties. S.Andrez Lieurance MD

## 2021-07-11 ENCOUNTER — Other Ambulatory Visit: Payer: Self-pay | Admitting: Radiology

## 2021-07-11 ENCOUNTER — Encounter (HOSPITAL_COMMUNITY): Payer: Self-pay | Admitting: Interventional Radiology

## 2021-07-11 DIAGNOSIS — I6521 Occlusion and stenosis of right carotid artery: Secondary | ICD-10-CM

## 2021-07-11 LAB — CBC WITH DIFFERENTIAL/PLATELET
Abs Immature Granulocytes: 0.01 10*3/uL (ref 0.00–0.07)
Basophils Absolute: 0 10*3/uL (ref 0.0–0.1)
Basophils Relative: 0 %
Eosinophils Absolute: 0.1 10*3/uL (ref 0.0–0.5)
Eosinophils Relative: 2 %
HCT: 27.3 % — ABNORMAL LOW (ref 36.0–46.0)
Hemoglobin: 9.1 g/dL — ABNORMAL LOW (ref 12.0–15.0)
Immature Granulocytes: 0 %
Lymphocytes Relative: 29 %
Lymphs Abs: 1.8 10*3/uL (ref 0.7–4.0)
MCH: 29.4 pg (ref 26.0–34.0)
MCHC: 33.3 g/dL (ref 30.0–36.0)
MCV: 88.1 fL (ref 80.0–100.0)
Monocytes Absolute: 0.6 10*3/uL (ref 0.1–1.0)
Monocytes Relative: 9 %
Neutro Abs: 3.7 10*3/uL (ref 1.7–7.7)
Neutrophils Relative %: 60 %
Platelets: 191 10*3/uL (ref 150–400)
RBC: 3.1 MIL/uL — ABNORMAL LOW (ref 3.87–5.11)
RDW: 15.2 % (ref 11.5–15.5)
WBC: 6.3 10*3/uL (ref 4.0–10.5)
nRBC: 0 % (ref 0.0–0.2)

## 2021-07-11 LAB — BASIC METABOLIC PANEL
Anion gap: 6 (ref 5–15)
BUN: 13 mg/dL (ref 8–23)
CO2: 24 mmol/L (ref 22–32)
Calcium: 8.3 mg/dL — ABNORMAL LOW (ref 8.9–10.3)
Chloride: 109 mmol/L (ref 98–111)
Creatinine, Ser: 0.8 mg/dL (ref 0.44–1.00)
GFR, Estimated: >60 mL/min (ref >60)
Glucose, Bld: 94 mg/dL (ref 70–99)
Potassium: 3.9 mmol/L (ref 3.5–5.1)
Sodium: 139 mmol/L (ref 135–145)

## 2021-07-11 NOTE — Discharge Summary (Signed)
Physician Discharge Summary      Patient ID: Sharon Gilbert MRN: [id number] DOB/AGE: Oct 25, 1940 80 y.o.  Admit date: 07/10/2021 Discharge date: 07/11/2021  Admission Diagnoses: Active Problems:   Carotid stenosis, symptomatic w/o infarct, right  Discharge Diagnoses:  Active Problems:   Carotid stenosis, symptomatic w/o infarct, right    Procedures: Procedure(s): STENTING  Discharged Condition: good  Hospital Course: Admitted to Neuro ICU after successful (R)ICA stent angioplasty. No overnight issues or complications. Neuro exam intact as below. Stable for discharge home. All medications, instructions, and follow up plans reviewed.  Consults: None   Discharge Exam: Blood pressure (!) 99/40, pulse 72, temperature 98.4 F (36.9 C), temperature source Axillary, resp. rate 13, height 5\' 3"  (1.6 m), weight 59 kg, SpO2 98 %. A&O x 3, NAD Neuro: EOMI, PERRLA Face symmetric, tongue midline No drift. Fine motor intact. Strength 5/5 and equal Lungs: CTA without w/r/r Heart: Regular Ext: (R)groin soft, NT, no hematoma Feet warm good cap refill   Disposition:  Discharge home   Discharge Instructions     Call MD for:  difficulty breathing, headache or visual disturbances   Complete by: As directed    Call MD for:  persistant nausea and vomiting   Complete by: As directed    Call MD for:  redness, tenderness, or signs of infection (pain, swelling, redness, odor or green/yellow discharge around incision site)   Complete by: As directed    Call MD for:  severe uncontrolled pain   Complete by: As directed    Call MD for:  temperature >100.4   Complete by: As directed    Diet - low sodium heart healthy   Complete by: As directed    Driving Restrictions   Complete by: As directed    Avoid driving for 2 weeks   Increase activity slowly   Complete by: As directed    May shower / Bathe   Complete by: As directed    May walk up steps   Complete by: As directed     Remove dressing in 24 hours   Complete by: As directed       Allergies as of 07/11/2021       Reactions   Lipitor [atorvastatin] Other (See Comments)   Muscle pain   Topiramate Other (See Comments)   Vomiting & Wheezing        Medication List     TAKE these medications    aspirin 325 MG tablet Take 325 mg by mouth in the morning.   atorvastatin 80 MG tablet Commonly known as: LIPITOR Take 1 tablet (80 mg total) by mouth daily at 6 PM.   clopidogrel 75 MG tablet Commonly known as: PLAVIX Take 75 mg by mouth in the morning.   dorzolamide-timolol 22.3-6.8 MG/ML ophthalmic solution Commonly known as: COSOPT Place 1 drop into both eyes 2 (two) times daily.   latanoprost 0.005 % ophthalmic solution Commonly known as: XALATAN Place 1 drop into both eyes at bedtime.   lisinopril 10 MG tablet Commonly known as: ZESTRIL Take 0.5 tablets (5 mg total) by mouth daily. What changed:  how much to take when to take this        Follow-up Information     09/10/2021, MD. Go in 2 week(s).   Specialties: Interventional Radiology, Radiology Why: Clinic will call Contact information: [address] [place] [phone]                 Signed: [phone]  Quiara Killian PA-C 07/11/2021, 9:45 AM

## 2021-07-16 ENCOUNTER — Telehealth (HOSPITAL_COMMUNITY): Payer: Self-pay

## 2021-07-16 NOTE — Telephone Encounter (Signed)
Called to schedule 2 wk f/u, no answer, left vm. AW 

## 2021-07-17 ENCOUNTER — Encounter (HOSPITAL_COMMUNITY): Payer: Self-pay

## 2021-07-17 ENCOUNTER — Other Ambulatory Visit (HOSPITAL_COMMUNITY): Payer: Self-pay | Admitting: Interventional Radiology

## 2021-07-17 DIAGNOSIS — E7800 Pure hypercholesterolemia, unspecified: Secondary | ICD-10-CM | POA: Diagnosis not present

## 2021-07-17 DIAGNOSIS — I771 Stricture of artery: Secondary | ICD-10-CM

## 2021-07-17 DIAGNOSIS — I69320 Aphasia following cerebral infarction: Secondary | ICD-10-CM | POA: Diagnosis not present

## 2021-07-17 DIAGNOSIS — Z8673 Personal history of transient ischemic attack (TIA), and cerebral infarction without residual deficits: Secondary | ICD-10-CM | POA: Diagnosis not present

## 2021-07-17 DIAGNOSIS — R748 Abnormal levels of other serum enzymes: Secondary | ICD-10-CM | POA: Diagnosis not present

## 2021-07-17 DIAGNOSIS — R634 Abnormal weight loss: Secondary | ICD-10-CM | POA: Diagnosis not present

## 2021-07-17 DIAGNOSIS — I10 Essential (primary) hypertension: Secondary | ICD-10-CM | POA: Diagnosis not present

## 2021-07-17 DIAGNOSIS — E1159 Type 2 diabetes mellitus with other circulatory complications: Secondary | ICD-10-CM | POA: Diagnosis not present

## 2021-07-17 DIAGNOSIS — K117 Disturbances of salivary secretion: Secondary | ICD-10-CM | POA: Diagnosis not present

## 2021-07-17 HISTORY — PX: IR ANGIO VERTEBRAL SEL SUBCLAVIAN INNOMINATE UNI R MOD SED: IMG5365

## 2021-07-17 HISTORY — PX: IR ANGIO INTRA EXTRACRAN SEL COM CAROTID INNOMINATE UNI R MOD SED: IMG5359

## 2021-07-24 ENCOUNTER — Ambulatory Visit (HOSPITAL_COMMUNITY): Admission: RE | Admit: 2021-07-24 | Payer: Medicare HMO | Source: Ambulatory Visit

## 2021-07-29 ENCOUNTER — Telehealth (HOSPITAL_COMMUNITY): Payer: Self-pay

## 2021-07-29 NOTE — Telephone Encounter (Signed)
Called to reschedule f/u, no answer, left vm. AW 

## 2021-08-05 DIAGNOSIS — M0579 Rheumatoid arthritis with rheumatoid factor of multiple sites without organ or systems involvement: Secondary | ICD-10-CM | POA: Diagnosis not present

## 2021-08-05 DIAGNOSIS — I10 Essential (primary) hypertension: Secondary | ICD-10-CM | POA: Diagnosis not present

## 2021-08-05 DIAGNOSIS — E7800 Pure hypercholesterolemia, unspecified: Secondary | ICD-10-CM | POA: Diagnosis not present

## 2021-08-05 DIAGNOSIS — E1159 Type 2 diabetes mellitus with other circulatory complications: Secondary | ICD-10-CM | POA: Diagnosis not present

## 2021-08-12 ENCOUNTER — Ambulatory Visit: Payer: Medicare HMO | Admitting: Adult Health

## 2021-08-12 DIAGNOSIS — E1159 Type 2 diabetes mellitus with other circulatory complications: Secondary | ICD-10-CM | POA: Diagnosis not present

## 2021-08-12 DIAGNOSIS — I10 Essential (primary) hypertension: Secondary | ICD-10-CM | POA: Diagnosis not present

## 2021-08-12 DIAGNOSIS — R748 Abnormal levels of other serum enzymes: Secondary | ICD-10-CM | POA: Diagnosis not present

## 2021-08-12 DIAGNOSIS — E7800 Pure hypercholesterolemia, unspecified: Secondary | ICD-10-CM | POA: Diagnosis not present

## 2021-08-12 DIAGNOSIS — Z8673 Personal history of transient ischemic attack (TIA), and cerebral infarction without residual deficits: Secondary | ICD-10-CM | POA: Diagnosis not present

## 2021-08-12 DIAGNOSIS — R634 Abnormal weight loss: Secondary | ICD-10-CM | POA: Diagnosis not present

## 2021-08-12 NOTE — Progress Notes (Deleted)
[facility] [address] [place]. [place] [address] [phone]       STROKE FOLLOW UP NOTE  Sharon Gilbert Date of Birth:  03-13-1941 Medical Record Number:  [id number]    Reason for Referral: stroke follow up    SUBJECTIVE:   CHIEF COMPLAINT:  No chief complaint on file.   HPI:   Update 08/12/2021 JM: Returns for 52-month stroke follow-up.  Accompanied by her son.  Overall stable from stroke standpoint since prior visit without new stroke/TIA symptoms Reports residual *** Therapy put on hold back in July due to scheduling conflicts with family - not yet restarted  S/p IR 07/10/2021 revascularization with stent assisted angioplasty for right ICA stenosis by Dr. Corliss Skains Remains on aspirin and Plavix as well as atorvastatin -denies side effects Blood pressure today ***      History provided for reference purposes only Update 02/05/2021 [name]: She returns for follow-up after last visit with [name] nurse practitioner 6 months ago.  She is accompanied by her son.  Patient has noticed improvement in her speech dose she is asked to be speak quite slowly.  She has occasional word substitutions but mostly is able to carry out a conversation and can comprehend quite well.  States she still has hand weakness and diminished fine motor skills.  Physical therapist recommend that she needs to do home exercises which she has not been doing.  Patient underwent CT angiogram of the brain on 01/26/2021 which I personally reviewed shows chronic severe right ICA stenosis which is persistent and new narrowing of the left M1 middle cerebral artery with patent proximal left ICA stent.  There is also severe stenosis at the origin of the dominant left vertebral artery origin.  Patient is scheduled to meet with Dr. Corliss Skains later this week to discuss revascularization options.  She remains on aspirin and Brilinta which is tolerating well without bruising or bleeding.  Her  blood pressure is under good control and today it is 135/80.  She remains on Crestor which she is tolerating well without muscle aches and pains.  She has not had any new recurrent stroke or TIA symptoms.  Patient is living at home and is mostly independent in most activities of daily living and needs very little help with ADLs.  The family provides good support.   Initial visit 08/22/2020 JM: Sharon Gilbert is being seen for hospital follow-up accompanied by her son.  After 15 days, she was discharged home from CIR on 07/26/2020 with recommended [facility] PT/OT/SLP for residual right hemiparesis, right inattention, dysphagia, aphasia and apraxia.  Since discharge, reports residual expressive aphasia, dysphagia, RUE weakness and imbalance. Eating softened foods but regular liquids without difficulty. Currently working with [facility] SLP/PT/OT with ongoing improvement.  Denies new or worsening stroke/TIA symptoms.  She is currently living with her elderly sister and is able to maintain majority of ADLs independently.  She does require assistance for IADLs such as cooking, cleaning, bill paying and transportation as she is not driving.  She was previously living independently.  Has continued on aspirin and Brilinta without bleeding or bruising. Son does question switching Brilinta to a different medication due to cost which she reports is currently $250/month.  She has scheduled visit with [facility] Dr. Corliss Skains on 09/07/2020 for initial hospital follow-up.  He also questions why there is no intervention performed while she was in the hospital for her right carotid stenosis.  Has continued on atorvastatin 80 mg daily without myalgias.  Blood  pressure today 131/69.  Monitors at home and typically 120-130s/80s. Son reports dietary modifications and increasing fluid intake.  No further concerns at this time   Stroke admission 07/03/2020 [name] is a 80 y.o. female with history of cataracts and diabetes who presented on  07/03/2020 with slurred speech, and mild face droop.  Personally reviewed hospitalization pertinent progress notes, lab work and imaging with summary provided.  Stroke work-up revealed L MCA infarct due to L ICA and M2 occlusion from large vessel disease with subsequent neuro worsening s/p L ICA stent and left MCA M1 thrombectomy with postprocedure hemorrhagic conversion at L MCA infarct.  Previously on aspirin and added Brilinta post stent for secondary stroke prevention.  Advised follow-up with Dr. Corliss Skains outpatient for surveillance monitoring for L ICA s/p stent and R ICA high-grade near occlusive stenosis with string sign. Hx of HTN on lisinopril-hydrochlorothiazide PTA and recommended long-term BP goal 130-150 given ICA stenosis.  LDL 202 and initiated atorvastatin 80 mg daily.  Pre-DM with A1c 6.1.  Other stroke risk factors include advanced age but no prior stroke history.  Residual deficits of expressive aphasia, dysphagia, right hemiparesis and decreased sensory.  Evaluated by therapies and recommended discharge to CIR for ongoing therapy needs.  She was discharged to CIR on 07/11/2020.  Stroke:   L MCA infarct due to L ICA and M2 occlusion from large vessel disease with subsequent neuro worsening s/p ICA stent and left MCA thrombectomy.  Postprocedure hemorrhagic conversion at L MCA infarct  CT head 9/28 No acute abnormality. Mild cerebral Atrophy. ASPECTS 10.  CTA head and neck L ICA occlusion beyond origin and remains occluded through neck. Proximal L M2 occlusion. High-grade near occlusive stenosis proximal R ICA w/ string sign. Intracranial R ICA w/ moderate stenosis cavernous segment. L VA origin w/ severe atherosclerosis narrowing.  CT perfusion 48mL penumbra L frontal operculum w/o  Core MRI 9/28 Multiple small L frontal and parietal lobe infarcts.  IR - 07/04/20 - Lt MCA M1 occlusion with TICI 3 revascularization. S/P revascularization of Lt ICA prox with stent assisted angioplasty.  CT  Head 9/30 - increased SAH which now extends into the basal cisterns.  Cytotoxic edema in the left insula and lateral frontal lobe  MRI 07/07/20 - Evolving moderate-sized acute left MCA infarct with similar appearance of parenchymal hemorrhage in the left insular region and subarachnoid hemorrhage compared to the prior CT.  MRA 07/07/20 -  Intracranial atherosclerosis with mild right and mild-to-moderate left ICA stenoses. The left ICA remains patent following revascularization.  2D Echo - EF 70 - 75%. No cardiac source of emboli identified.  LDL 202  HgbA1c 6.1  P2Y12 19  VTE prophylaxis - Lovenox 40 mg sq daily  aspirin 81 mg daily prior to admission, now on aspirin 81 mg daily and Brilinta (ticagrelor) 90 mg bid. Continue on discharge. Therapy recommendations: CIR Disposition:  CIR        ROS:   Positive for word finding difficulties, speech difficulties, and weakness, diminished fine motor skills, gait difficulty and all other systems negative  PMH:  Past Medical History:  Diagnosis Date   Cataract    Diabetes mellitus without complication (HCC)    pt son denies pt being diabetic 07/09/21   Hypertension    Stroke (HCC)     PSH:  Past Surgical History:  Procedure Laterality Date   IR ANGIO INTRA EXTRACRAN SEL COM CAROTID INNOMINATE UNI R MOD SED  07/17/2021   IR ANGIO VERTEBRAL  SEL SUBCLAVIAN INNOMINATE UNI R MOD SED  07/17/2021   IR CT HEAD LTD  07/04/2020   IR CT HEAD LTD  07/10/2021   IR INTRAVSC STENT CERV CAROTID W/EMB-PROT MOD SED INCL ANGIO  07/10/2021   IR INTRAVSC STENT CERV CAROTID W/O EMB-PROT MOD SED INC ANGIO  07/04/2020   IR PERCUTANEOUS ART THROMBECTOMY/INFUSION INTRACRANIAL INC DIAG ANGIO  07/04/2020   IR RADIOLOGIST EVAL & MGMT  10/01/2020   IR RADIOLOGIST EVAL & MGMT  02/07/2021   RADIOLOGY WITH ANESTHESIA N/A 07/04/2020   Procedure: IR WITH ANESTHESIA;  Surgeon: Radiologist, Medication, MD;  Location: [facility] OR;  Service: Radiology;  Laterality: N/A;   RADIOLOGY  WITH ANESTHESIA N/A 07/10/2021   Procedure: STENTING;  Surgeon: Julieanne Cotton, MD;  Location: [facility] OR;  Service: Radiology;  Laterality: N/A;    Social History:  Social History   Socioeconomic History   Marital status: Married    Spouse name: Not on file   Number of children: Not on file   Years of education: Not on file   Highest education level: Not on file  Occupational History   Not on file  Tobacco Use   Smoking status: Never   Smokeless tobacco: Never  Vaping Use   Vaping Use: Never used  Substance and Sexual Activity   Alcohol use: No   Drug use: No   Sexual activity: Yes  Other Topics Concern   Not on file  Social History Narrative   Lives with son   Right Handed   Drinks caffeine occassionally   Social Determinants of Health   Financial Resource Strain: Not on file  Food Insecurity: Not on file  Transportation Needs: Not on file  Physical Activity: Not on file  Stress: Not on file  Social Connections: Not on file  Intimate Partner Violence: Not on file    Family History: No family history on file.  Medications:   Current Outpatient Medications on File Prior to Visit  Medication Sig Dispense Refill   aspirin 325 MG tablet Take 325 mg by mouth in the morning.     atorvastatin (LIPITOR) 80 MG tablet Take 1 tablet (80 mg total) by mouth daily at 6 PM. 60 tablet 0   clopidogrel (PLAVIX) 75 MG tablet Take 75 mg by mouth in the morning.     dorzolamide-timolol (COSOPT) 22.3-6.8 MG/ML ophthalmic solution Place 1 drop into both eyes 2 (two) times daily. 10 mL 12   latanoprost (XALATAN) 0.005 % ophthalmic solution Place 1 drop into both eyes at bedtime. 2.5 mL 12   lisinopril (ZESTRIL) 10 MG tablet Take 0.5 tablets (5 mg total) by mouth daily. (Patient taking differently: Take 10 mg by mouth in the morning.) 30 tablet 0   No current facility-administered medications on file prior to visit.    Allergies:   Allergies  Allergen Reactions   Lipitor  [Atorvastatin] Other (See Comments)    Muscle pain   Topiramate Other (See Comments)    Vomiting & Wheezing      OBJECTIVE:  Physical Exam  There were no vitals filed for this visit.  There is no height or weight on file to calculate BMI. No results found.  General: Frail  pleasant elderly African-American female, seated, in no evident distress Head: head normocephalic and atraumatic.   Neck: supple with no carotid or supraclavicular bruits Cardiovascular: regular rate and rhythm, no murmurs Musculoskeletal: no deformity Skin:  no rash/petichiae Vascular:  Normal pulses all extremities   Neurologic Exam Mental Status:  Awake and fully alert.   Mild expressive aphasia with slight receptive aphasia.  Nonfluent speech.  No paraphasic errors.  Able to name repeat quite well.  Able to follow simple step commands without difficulty.  Difficulty assessing orientation due to aphasia. Mood and affect appropriate.  Cranial Nerves: Pupils equal, briskly reactive to light. Extraocular movements full without nystagmus. Visual fields full to confrontation. Hearing intact. Facial sensation intact.  Mild right lower facial weakness.  Tongue, palate moves normally and symmetrically.  Motor: Normal bulk and tone. Normal strength in all tested extremity muscles except RUE 4/5 with mildly weak grip strength and decreased finger dexterity.  Orbits left over right upper extremity. Sensory.:  Altered sensation right upper and lower extremity compared to left side Coordination: Rapid alternating movements normal in all extremities except decreased right hand. Finger-to-nose performed accurately LUE and heel-to-shin performed accurately bilaterally. Gait and Station: Arises from chair without difficulty. Stance is normal. Gait demonstrates normal stride length with mild imbalance greater with turns.  No assistive device.  Mild difficulty with tandem walk and heel toe walk.  Romberg negative. Reflexes: 1+ and  symmetric. Toes downgoing.       ASSESSMENT: Sharon Gilbert is a 80 y.o. year old female with L MCA infarct due to left ICA and M2 occlusion for large vessel disease with subsequent neuro worsening s/p L ICA stent and left MCA M2 thrombectomy with postprocedure hemorrhagic conversion at left MCA infarct on 07/03/2020 after presenting with slurred speech, right facial droop, aphasia and RUE weakness.  Vascular risk factors include HTN, HLD, pre-DM, b/l carotid stenosis and advanced age.  Repeat CT angiogram on 01/26/2021 shows persistent chronic severe right ICA stenosis as well as new narrowing of the left M1 but patent left proximal ICA stent.  There is also severe stenosis of the dominant left vertebral artery origin.     PLAN:  Hx of L MCA stroke -Continue aspirin 81 mg daily and clopidogrel 75 mg daily given history of carotid stent for secondary stroke prevention -Routine follow-up with PCP to maintain strict control of hypertension with blood pressure goal below 130/90 and lipids with LDL cholesterol goal below 70 mg/dL.   R ICA stenosis -07/10/2021 s/p revascularization of stenotic proximal right ICA with stent assisted angioplasty and distal protection by Dr. Corliss Skains -Remains on DAPT per IR recommendations -advised to contact Dr. Fatima Sanger office to schedule follow-up (per epic - IR has been unable to contact)     Followup in the future with my nurse practitioner [name] in 6 months or call earlier if necessary.     CC:  [facility] provider: Pearson Grippe, MD   I spent *** minutes of face-to-face and non-face-to-face time with patient.  This included previsit chart review, lab review, study review, order entry, electronic health record documentation, patient education  Ihor Austin, [facility]  [facility] [address] [place], [place] [address]  Phone [phone] Fax [phone] Note: This document was prepared with digital dictation and  possible smart phrase technology. Any transcriptional errors that result from this process are unintentional.

## 2021-08-19 DIAGNOSIS — B372 Candidiasis of skin and nail: Secondary | ICD-10-CM | POA: Diagnosis not present

## 2021-08-19 DIAGNOSIS — R7303 Prediabetes: Secondary | ICD-10-CM | POA: Diagnosis not present

## 2021-08-19 DIAGNOSIS — Z8673 Personal history of transient ischemic attack (TIA), and cerebral infarction without residual deficits: Secondary | ICD-10-CM | POA: Diagnosis not present

## 2021-08-19 DIAGNOSIS — L309 Dermatitis, unspecified: Secondary | ICD-10-CM | POA: Diagnosis not present

## 2021-08-19 DIAGNOSIS — M0579 Rheumatoid arthritis with rheumatoid factor of multiple sites without organ or systems involvement: Secondary | ICD-10-CM | POA: Diagnosis not present

## 2021-08-19 DIAGNOSIS — Z0000 Encounter for general adult medical examination without abnormal findings: Secondary | ICD-10-CM | POA: Diagnosis not present

## 2021-08-19 DIAGNOSIS — I69320 Aphasia following cerebral infarction: Secondary | ICD-10-CM | POA: Diagnosis not present

## 2021-08-19 DIAGNOSIS — I6521 Occlusion and stenosis of right carotid artery: Secondary | ICD-10-CM | POA: Diagnosis not present

## 2021-08-19 DIAGNOSIS — Z79899 Other long term (current) drug therapy: Secondary | ICD-10-CM | POA: Diagnosis not present

## 2021-09-03 DIAGNOSIS — H524 Presbyopia: Secondary | ICD-10-CM | POA: Diagnosis not present

## 2021-09-03 DIAGNOSIS — H40022 Open angle with borderline findings, high risk, left eye: Secondary | ICD-10-CM | POA: Diagnosis not present

## 2021-09-03 DIAGNOSIS — E119 Type 2 diabetes mellitus without complications: Secondary | ICD-10-CM | POA: Diagnosis not present

## 2021-09-03 DIAGNOSIS — H401111 Primary open-angle glaucoma, right eye, mild stage: Secondary | ICD-10-CM | POA: Diagnosis not present

## 2021-10-17 ENCOUNTER — Ambulatory Visit: Payer: Medicare HMO | Admitting: Adult Health

## 2021-10-17 ENCOUNTER — Encounter: Payer: Self-pay | Admitting: Adult Health

## 2021-10-17 NOTE — Progress Notes (Deleted)
[facility] [address] [place] [place] [address] [phone]       STROKE FOLLOW UP NOTE  [name] Date of Birth:  [birth year] Medical Record Number:  [id number]   Reason for Referral: stroke follow up    SUBJECTIVE:   CHIEF COMPLAINT:  No chief complaint on file.   HPI:   Update 10/17/2021 JM: returns for f/u stroke visit after prior visit with [name] 8 months ago.  Overall stable from stroke standpoint without new stroke/TIA symptoms.  Reports residual ***.    S/p R ICA angioplasty 07/10/2021 by Dr. Corliss Skains without complication. IR has not been able to contact to schedule f/u visit per epic review.  Remains on aspirin and Plavix without side effects.  Remains on atorvastatin without side effects.  Blood pressure today ***.      History provided for reference purposes only Update 02/05/2021 [name]: She returns for follow-up after last visit with [name] nurse practitioner 6 months ago.  She is accompanied by her son.  Patient has noticed improvement in her speech dose she is asked to be speak quite slowly.  She has occasional word substitutions but mostly is able to carry out a conversation and can comprehend quite well.  States she still has hand weakness and diminished fine motor skills.  Physical therapist recommend that she needs to do home exercises which she has not been doing.  Patient underwent CT angiogram of the brain on 01/26/2021 which I personally reviewed shows chronic severe right ICA stenosis which is persistent and new narrowing of the left M1 middle cerebral artery with patent proximal left ICA stent.  There is also severe stenosis at the origin of the dominant left vertebral artery origin.  Patient is scheduled to meet with Dr. Corliss Skains later this week to discuss revascularization options.  She remains on aspirin and Brilinta which is tolerating well without bruising or bleeding.  Her blood pressure is under good control and  today it is 135/80.  She remains on Crestor which she is tolerating well without muscle aches and pains.  She has not had any new recurrent stroke or TIA symptoms.  Patient is living at home and is mostly independent in most activities of daily living and needs very little help with ADLs.  The family provides good support.  Initial visit 08/22/2020 JM: [name] is being seen for hospital follow-up accompanied by her son.  After 15 days, she was discharged home from CIR on 07/26/2020 with recommended [facility] PT/OT/SLP for residual right hemiparesis, right inattention, dysphagia, aphasia and apraxia.  Since discharge, reports residual expressive aphasia, dysphagia, RUE weakness and imbalance. Eating softened foods but regular liquids without difficulty. Currently working with [facility] SLP/PT/OT with ongoing improvement.  Denies new or worsening stroke/TIA symptoms.  She is currently living with her elderly sister and is able to maintain majority of ADLs independently.  She does require assistance for IADLs such as cooking, cleaning, bill paying and transportation as she is not driving.  She was previously living independently.  Has continued on aspirin and Brilinta without bleeding or bruising. Son does question switching Brilinta to a different medication due to cost which she reports is currently $250/month.  She has scheduled visit with [facility] Dr. Corliss Skains on 09/07/2020 for initial hospital follow-up.  He also questions why there is no intervention performed while she was in the hospital for her right carotid stenosis.  Has continued on atorvastatin 80 mg daily without myalgias.  Blood pressure  today 131/69.  Monitors at home and typically 120-130s/80s. Son reports dietary modifications and increasing fluid intake.  No further concerns at this time   Stroke admission 07/03/2020 Ms. Sharon RoughMildred D Gilbert is a 81 y.o. female with history of cataracts and diabetes who presented on 07/03/2020 with slurred speech, and mild face droop.   Personally reviewed hospitalization pertinent progress notes, lab work and imaging with summary provided.  Stroke work-up revealed L MCA infarct due to L ICA and M2 occlusion from large vessel disease with subsequent neuro worsening s/p L ICA stent and left MCA M1 thrombectomy with postprocedure hemorrhagic conversion at L MCA infarct.  Previously on aspirin and added Brilinta post stent for secondary stroke prevention.  Advised follow-up with Dr. Corliss Skainseveshwar outpatient for surveillance monitoring for L ICA s/p stent and R ICA high-grade near occlusive stenosis with string sign. Hx of HTN on lisinopril-hydrochlorothiazide PTA and recommended long-term BP goal 130-150 given ICA stenosis.  LDL 202 and initiated atorvastatin 80 mg daily.  Pre-DM with A1c 6.1.  Other stroke risk factors include advanced age but no prior stroke history.  Residual deficits of expressive aphasia, dysphagia, right hemiparesis and decreased sensory.  Evaluated by therapies and recommended discharge to CIR for ongoing therapy needs.  She was discharged to CIR on 07/11/2020.  Stroke:   L MCA infarct due to L ICA and M2 occlusion from large vessel disease with subsequent neuro worsening s/p ICA stent and left MCA thrombectomy.  Postprocedure hemorrhagic conversion at L MCA infarct  CT head 9/28 No acute abnormality. Mild cerebral Atrophy. ASPECTS 10.  CTA head and neck L ICA occlusion beyond origin and remains occluded through neck. Proximal L M2 occlusion. High-grade near occlusive stenosis proximal R ICA w/ string sign. Intracranial R ICA w/ moderate stenosis cavernous segment. L VA origin w/ severe atherosclerosis narrowing.  CT perfusion 31mL penumbra L frontal operculum w/o  Core MRI 9/28 Multiple small L frontal and parietal lobe infarcts.  IR - 07/04/20 - Lt MCA M1 occlusion with TICI 3 revascularization. S/P revascularization of Lt ICA prox with stent assisted angioplasty.  CT Head 9/30 - increased SAH which now extends into the  basal cisterns.  Cytotoxic edema in the left insula and lateral frontal lobe  MRI 07/07/20 - Evolving moderate-sized acute left MCA infarct with similar appearance of parenchymal hemorrhage in the left insular region and subarachnoid hemorrhage compared to the prior CT.  MRA 07/07/20 -  Intracranial atherosclerosis with mild right and mild-to-moderate left ICA stenoses. The left ICA remains patent following revascularization.  2D Echo - EF 70 - 75%. No cardiac source of emboli identified.  LDL 202  HgbA1c 6.1  P2Y12 19  VTE prophylaxis - Lovenox 40 mg sq daily  aspirin 81 mg daily prior to admission, now on aspirin 81 mg daily and Brilinta (ticagrelor) 90 mg bid. Continue on discharge. Therapy recommendations: CIR Disposition:  CIR    ROS:   Unable to obtain due to aphasia  PMH:  Past Medical History:  Diagnosis Date   Cataract    Diabetes mellitus without complication (HCC)    pt son denies pt being diabetic 07/09/21   Hypertension    Stroke (HCC)     PSH:  Past Surgical History:  Procedure Laterality Date   IR ANGIO INTRA EXTRACRAN SEL COM CAROTID INNOMINATE UNI R MOD SED  07/17/2021   IR ANGIO VERTEBRAL SEL SUBCLAVIAN INNOMINATE UNI R MOD SED  07/17/2021   IR CT HEAD LTD  07/04/2020  IR CT HEAD LTD  07/10/2021   IR INTRAVSC STENT CERV CAROTID W/EMB-PROT MOD SED INCL ANGIO  07/10/2021   IR INTRAVSC STENT CERV CAROTID W/O EMB-PROT MOD SED INC ANGIO  07/04/2020   IR PERCUTANEOUS ART THROMBECTOMY/INFUSION INTRACRANIAL INC DIAG ANGIO  07/04/2020   IR RADIOLOGIST EVAL & MGMT  10/01/2020   IR RADIOLOGIST EVAL & MGMT  02/07/2021   RADIOLOGY WITH ANESTHESIA N/A 07/04/2020   Procedure: IR WITH ANESTHESIA;  Surgeon: Radiologist, Medication, MD;  Location: [facility] OR;  Service: Radiology;  Laterality: N/A;   RADIOLOGY WITH ANESTHESIA N/A 07/10/2021   Procedure: STENTING;  Surgeon: Julieanne Cotton, MD;  Location: [facility] OR;  Service: Radiology;  Laterality: N/A;    Social History:  Social History    Socioeconomic History   Marital status: Married    Spouse name: Not on file   Number of children: Not on file   Years of education: Not on file   Highest education level: Not on file  Occupational History   Not on file  Tobacco Use   Smoking status: Never   Smokeless tobacco: Never  Vaping Use   Vaping Use: Never used  Substance and Sexual Activity   Alcohol use: No   Drug use: No   Sexual activity: Yes  Other Topics Concern   Not on file  Social History Narrative   Lives with son   Right Handed   Drinks caffeine occassionally   Social Determinants of Health   Financial Resource Strain: Not on file  Food Insecurity: Not on file  Transportation Needs: Not on file  Physical Activity: Not on file  Stress: Not on file  Social Connections: Not on file  Intimate Partner Violence: Not on file    Family History: No family history on file.  Medications:   Current Outpatient Medications on File Prior to Visit  Medication Sig Dispense Refill   aspirin 325 MG tablet Take 325 mg by mouth in the morning.     atorvastatin (LIPITOR) 80 MG tablet Take 1 tablet (80 mg total) by mouth daily at 6 PM. 60 tablet 0   clopidogrel (PLAVIX) 75 MG tablet Take 75 mg by mouth in the morning.     dorzolamide-timolol (COSOPT) 22.3-6.8 MG/ML ophthalmic solution Place 1 drop into both eyes 2 (two) times daily. 10 mL 12   latanoprost (XALATAN) 0.005 % ophthalmic solution Place 1 drop into both eyes at bedtime. 2.5 mL 12   lisinopril (ZESTRIL) 10 MG tablet Take 0.5 tablets (5 mg total) by mouth daily. (Patient taking differently: Take 10 mg by mouth in the morning.) 30 tablet 0   No current facility-administered medications on file prior to visit.    Allergies:   Allergies  Allergen Reactions   Lipitor [Atorvastatin] Other (See Comments)    Muscle pain   Topiramate Other (See Comments)    Vomiting & Wheezing      OBJECTIVE:  Physical Exam  There were no vitals filed for this  visit.  There is no height or weight on file to calculate BMI. No results found.  General: well developed, well nourished,  pleasant elderly African-American female, seated, in no evident distress Head: head normocephalic and atraumatic.   Neck: supple with no carotid or supraclavicular bruits Cardiovascular: regular rate and rhythm, no murmurs Musculoskeletal: no deformity Skin:  no rash/petichiae Vascular:  Normal pulses all extremities   Neurologic Exam Mental Status: Awake and fully alert.   Moderate to severe expressive aphasia with possible mild receptive aphasia.  Able to follow simple step commands without difficulty.  Difficulty assessing orientation due to aphasia. Mood and affect appropriate.  Cranial Nerves: Fundoscopic exam reveals sharp disc margins. Pupils equal, briskly reactive to light. Extraocular movements full without nystagmus. Visual fields full to confrontation. Hearing intact. Facial sensation intact.  Mild right lower facial weakness.  Tongue, palate moves normally and symmetrically.  Motor: Normal bulk and tone. Normal strength in all tested extremity muscles except RUE 4/5 with mildly weak grip strength and decreased finger dexterity Sensory.:  Altered sensation right upper and lower extremity compared to left side Coordination: Rapid alternating movements normal in all extremities except decreased right hand. Finger-to-nose performed accurately LUE and heel-to-shin performed accurately bilaterally. Gait and Station: Arises from chair without difficulty. Stance is normal. Gait demonstrates normal stride length with mild imbalance greater with turns.  No assistive device.  Mild difficulty with tandem walk and heel toe walk.  Romberg negative. Reflexes: 1+ and symmetric. Toes downgoing.         ASSESSMENT: Sharon RoughMildred D Sharon Gilbert is a 81 y.o. year old female with L MCA infarct due to left ICA and M2 occlusion for large vessel disease with subsequent neuro worsening s/p L  ICA stent and left MCA M2 thrombectomy with postprocedure hemorrhagic conversion at left MCA infarct on 07/03/2020 after presenting with slurred speech, right facial droop, aphasia and RUE weakness.  Vascular risk factors include HTN, HLD, b/l carotid stenosis s/p stenting and advanced age.      PLAN:  L MCA stroke :  Residual deficit: RUE weakness, hemisensory impairment, moderate to severe aphasia and possible mild receptive aphasia, dysphagia and imbalance.  Advise continued participation with home health therapies and possibly transition to outpatient therapies once completed.  Would not recommend she return to driving or her home living completely independently at this time until further recovery which was further discussed with both patient and son. Continue aspirin 81 mg daily and clopidogrel 75 mg daily  and atorvastatin 80 mg daily for secondary stroke prevention.  Ongoing duration of plavix determined by Dr. Corliss Skainseveshwar Discussed secondary stroke prevention measures and importance of close PCP follow up for aggressive stroke risk factor management  Carotid stenosis:  s/p L ICA stent 06/2020 S/p R ICA stent 07/2021 Advised to call IR to schedule f/u visit - IR unable to contact to schedule s/p recent angioplasty Ongoing DAPT per IR recommendations HTN: BP goal <130/90.  Stable on lisinopril per PCP HLD: LDL goal <70.  On atorvastatin 80 mg daily.  Lipid panel 08/2021 LDL 83    Follow up in 3 months or call earlier if needed - family requests f/u with Dr. Pearlean BrownieSethi   CC:  [facility] provider: Dr. Demetrios IsaacsSethi Kim, Fayrene FearingJames, MD    I spent 45 minutes of face-to-face and non-face-to-face time with patient.  This included previsit chart review including pertinent hospitalization progress notes, lab work and imaging, lab review, study review, order entry, electronic health record documentation, patient and son education and discussion regarding recent stroke, residual deficits, importance of managing stroke  risk factors and answered all questions to patient satisfaction    Ihor AustinJessica McCue, [facility] Specialty HospitalGNP-BC  [facility] [address] [place], [place] [id number]  Phone [phone] Fax [phone] Note: This document was prepared with digital dictation and possible smart phrase technology. Any transcriptional errors that result from this process are unintentional.

## 2021-11-05 DIAGNOSIS — E7800 Pure hypercholesterolemia, unspecified: Secondary | ICD-10-CM | POA: Diagnosis not present

## 2021-11-05 DIAGNOSIS — M0579 Rheumatoid arthritis with rheumatoid factor of multiple sites without organ or systems involvement: Secondary | ICD-10-CM | POA: Diagnosis not present

## 2021-11-05 DIAGNOSIS — E1159 Type 2 diabetes mellitus with other circulatory complications: Secondary | ICD-10-CM | POA: Diagnosis not present

## 2021-11-05 DIAGNOSIS — I10 Essential (primary) hypertension: Secondary | ICD-10-CM | POA: Diagnosis not present

## 2021-11-20 ENCOUNTER — Other Ambulatory Visit: Payer: Self-pay

## 2021-11-20 ENCOUNTER — Encounter: Payer: Self-pay | Admitting: Podiatry

## 2021-11-20 ENCOUNTER — Ambulatory Visit: Payer: Medicare HMO | Admitting: Podiatry

## 2021-11-20 DIAGNOSIS — M79674 Pain in right toe(s): Secondary | ICD-10-CM | POA: Diagnosis not present

## 2021-11-20 DIAGNOSIS — M79675 Pain in left toe(s): Secondary | ICD-10-CM | POA: Diagnosis not present

## 2021-11-20 DIAGNOSIS — B351 Tinea unguium: Secondary | ICD-10-CM

## 2021-11-20 NOTE — Progress Notes (Signed)
°  Subjective:  Patient ID: Sharon Gilbert, female    DOB: Oct 07, 1940,   MRN: [id number]  No chief complaint on file.   81 y.o. female presents for concern of thickened elongated and painful nails that are difficult to trim. Requesting to have them trimmed today. Denies any burning or tingling in her feet. She is not diabetic and not on blood thinner.   . Denies any other pedal complaints. Denies n/v/f/c.   Past Medical History:  Diagnosis Date   Cataract    Diabetes mellitus without complication [facility])    pt son denies pt being diabetic 07/09/21   Hypertension    Stroke [facility])     Objective:  Physical Exam: Vascular: DP/PT pulses 2/4 bilateral. CFT <3 seconds. Normal hair growth on digits. No edema.  Skin. No lacerations or abrasions bilateral feet. Nails 1-5 are thickened discolored and elongated with subungual debris.   Musculoskeletal: MMT 5/5 bilateral lower extremities in DF, PF, Inversion and Eversion. Deceased ROM in DF of ankle joint.  Neurological: Sensation intact to light touch.   Assessment:   1. Pain due to onychomycosis of toenails of both feet      Plan:  Patient was evaluated and treated and all questions answered. -Discussed and educated patient on foot care, especially with  regards to the vascular, neurological and musculoskeletal systems.  -Discussed supportive shoes at all times and checking feet regularly.  -Mechanically debrided all nails 1-5 bilateral using sterile nail nipper and filed with dremel without incident  -Answered all patient questions -Patient to return  as needed.  -Patient advised to call the office if any problems or questions arise in the meantime.   Louann Sjogren, DPM

## 2022-02-10 DIAGNOSIS — Z79899 Other long term (current) drug therapy: Secondary | ICD-10-CM | POA: Diagnosis not present

## 2022-02-10 DIAGNOSIS — R7303 Prediabetes: Secondary | ICD-10-CM | POA: Diagnosis not present

## 2022-02-10 DIAGNOSIS — Z8673 Personal history of transient ischemic attack (TIA), and cerebral infarction without residual deficits: Secondary | ICD-10-CM | POA: Diagnosis not present

## 2022-02-17 DIAGNOSIS — I6521 Occlusion and stenosis of right carotid artery: Secondary | ICD-10-CM | POA: Diagnosis not present

## 2022-02-17 DIAGNOSIS — Z8673 Personal history of transient ischemic attack (TIA), and cerebral infarction without residual deficits: Secondary | ICD-10-CM | POA: Diagnosis not present

## 2022-02-17 DIAGNOSIS — I10 Essential (primary) hypertension: Secondary | ICD-10-CM | POA: Diagnosis not present

## 2022-02-17 DIAGNOSIS — H9190 Unspecified hearing loss, unspecified ear: Secondary | ICD-10-CM | POA: Diagnosis not present

## 2022-02-17 DIAGNOSIS — I69320 Aphasia following cerebral infarction: Secondary | ICD-10-CM | POA: Diagnosis not present

## 2022-02-17 DIAGNOSIS — R635 Abnormal weight gain: Secondary | ICD-10-CM | POA: Diagnosis not present

## 2022-02-17 DIAGNOSIS — E7800 Pure hypercholesterolemia, unspecified: Secondary | ICD-10-CM | POA: Diagnosis not present

## 2022-03-12 DIAGNOSIS — H40022 Open angle with borderline findings, high risk, left eye: Secondary | ICD-10-CM | POA: Diagnosis not present

## 2022-03-12 DIAGNOSIS — H401111 Primary open-angle glaucoma, right eye, mild stage: Secondary | ICD-10-CM | POA: Diagnosis not present

## 2022-03-18 DIAGNOSIS — Z823 Family history of stroke: Secondary | ICD-10-CM | POA: Diagnosis not present

## 2022-03-18 DIAGNOSIS — I69320 Aphasia following cerebral infarction: Secondary | ICD-10-CM | POA: Diagnosis not present

## 2022-03-18 DIAGNOSIS — H269 Unspecified cataract: Secondary | ICD-10-CM | POA: Diagnosis not present

## 2022-03-18 DIAGNOSIS — Z7902 Long term (current) use of antithrombotics/antiplatelets: Secondary | ICD-10-CM | POA: Diagnosis not present

## 2022-03-18 DIAGNOSIS — Z87891 Personal history of nicotine dependence: Secondary | ICD-10-CM | POA: Diagnosis not present

## 2022-03-18 DIAGNOSIS — H409 Unspecified glaucoma: Secondary | ICD-10-CM | POA: Diagnosis not present

## 2022-03-18 DIAGNOSIS — I10 Essential (primary) hypertension: Secondary | ICD-10-CM | POA: Diagnosis not present

## 2022-03-18 DIAGNOSIS — Z008 Encounter for other general examination: Secondary | ICD-10-CM | POA: Diagnosis not present

## 2022-03-18 DIAGNOSIS — Z809 Family history of malignant neoplasm, unspecified: Secondary | ICD-10-CM | POA: Diagnosis not present

## 2022-03-18 DIAGNOSIS — I69331 Monoplegia of upper limb following cerebral infarction affecting right dominant side: Secondary | ICD-10-CM | POA: Diagnosis not present

## 2022-03-18 DIAGNOSIS — I739 Peripheral vascular disease, unspecified: Secondary | ICD-10-CM | POA: Diagnosis not present

## 2022-07-22 ENCOUNTER — Observation Stay (HOSPITAL_COMMUNITY)
Admission: EM | Admit: 2022-07-22 | Discharge: 2022-07-23 | Disposition: A | Discharge status: Home / Self Care | Payer: Medicare HMO | Attending: Internal Medicine | Admitting: Internal Medicine

## 2022-07-22 ENCOUNTER — Emergency Department (HOSPITAL_COMMUNITY): Payer: Medicare HMO

## 2022-07-22 ENCOUNTER — Encounter (HOSPITAL_COMMUNITY): Payer: Self-pay

## 2022-07-22 ENCOUNTER — Other Ambulatory Visit: Payer: Self-pay

## 2022-07-22 DIAGNOSIS — I161 Hypertensive emergency: Secondary | ICD-10-CM | POA: Diagnosis not present

## 2022-07-22 DIAGNOSIS — R110 Nausea: Secondary | ICD-10-CM | POA: Diagnosis not present

## 2022-07-22 DIAGNOSIS — R42 Dizziness and giddiness: Secondary | ICD-10-CM | POA: Diagnosis not present

## 2022-07-22 DIAGNOSIS — G9389 Other specified disorders of brain: Secondary | ICD-10-CM | POA: Diagnosis not present

## 2022-07-22 DIAGNOSIS — E118 Type 2 diabetes mellitus with unspecified complications: Secondary | ICD-10-CM | POA: Diagnosis present

## 2022-07-22 DIAGNOSIS — Z952 Presence of prosthetic heart valve: Secondary | ICD-10-CM | POA: Diagnosis not present

## 2022-07-22 DIAGNOSIS — I10 Essential (primary) hypertension: Secondary | ICD-10-CM | POA: Diagnosis not present

## 2022-07-22 DIAGNOSIS — Z7902 Long term (current) use of antithrombotics/antiplatelets: Secondary | ICD-10-CM | POA: Diagnosis not present

## 2022-07-22 DIAGNOSIS — Z7982 Long term (current) use of aspirin: Secondary | ICD-10-CM | POA: Diagnosis not present

## 2022-07-22 DIAGNOSIS — R4781 Slurred speech: Secondary | ICD-10-CM | POA: Diagnosis not present

## 2022-07-22 DIAGNOSIS — E785 Hyperlipidemia, unspecified: Secondary | ICD-10-CM | POA: Diagnosis present

## 2022-07-22 DIAGNOSIS — G459 Transient cerebral ischemic attack, unspecified: Secondary | ICD-10-CM | POA: Diagnosis present

## 2022-07-22 DIAGNOSIS — R29898 Other symptoms and signs involving the musculoskeletal system: Secondary | ICD-10-CM | POA: Diagnosis present

## 2022-07-22 DIAGNOSIS — Z79899 Other long term (current) drug therapy: Secondary | ICD-10-CM | POA: Insufficient documentation

## 2022-07-22 DIAGNOSIS — R1111 Vomiting without nausea: Secondary | ICD-10-CM | POA: Diagnosis not present

## 2022-07-22 DIAGNOSIS — R778 Other specified abnormalities of plasma proteins: Secondary | ICD-10-CM | POA: Diagnosis not present

## 2022-07-22 DIAGNOSIS — E119 Type 2 diabetes mellitus without complications: Secondary | ICD-10-CM | POA: Insufficient documentation

## 2022-07-22 DIAGNOSIS — I6522 Occlusion and stenosis of left carotid artery: Secondary | ICD-10-CM | POA: Diagnosis not present

## 2022-07-22 DIAGNOSIS — I160 Hypertensive urgency: Secondary | ICD-10-CM | POA: Diagnosis not present

## 2022-07-22 DIAGNOSIS — R7989 Other specified abnormal findings of blood chemistry: Secondary | ICD-10-CM | POA: Diagnosis present

## 2022-07-22 DIAGNOSIS — Z8673 Personal history of transient ischemic attack (TIA), and cerebral infarction without residual deficits: Secondary | ICD-10-CM | POA: Diagnosis not present

## 2022-07-22 LAB — URINALYSIS, ROUTINE W REFLEX MICROSCOPIC
Bilirubin Urine: NEGATIVE
Glucose, UA: NEGATIVE mg/dL
Ketones, ur: NEGATIVE mg/dL
Leukocytes,Ua: NEGATIVE
Nitrite: NEGATIVE
Protein, ur: NEGATIVE mg/dL
Specific Gravity, Urine: 1.004 — ABNORMAL LOW (ref 1.005–1.030)
pH: 8 (ref 5.0–8.0)

## 2022-07-22 LAB — CBC WITH DIFFERENTIAL/PLATELET
Abs Immature Granulocytes: 0.02 10*3/uL (ref 0.00–0.07)
Basophils Absolute: 0 10*3/uL (ref 0.0–0.1)
Basophils Relative: 0 %
Eosinophils Absolute: 0.1 10*3/uL (ref 0.0–0.5)
Eosinophils Relative: 2 %
HCT: 41.9 % (ref 36.0–46.0)
Hemoglobin: 13.9 g/dL (ref 12.0–15.0)
Immature Granulocytes: 0 %
Lymphocytes Relative: 24 %
Lymphs Abs: 1.6 10*3/uL (ref 0.7–4.0)
MCH: 29.4 pg (ref 26.0–34.0)
MCHC: 33.2 g/dL (ref 30.0–36.0)
MCV: 88.6 fL (ref 80.0–100.0)
Monocytes Absolute: 0.5 10*3/uL (ref 0.1–1.0)
Monocytes Relative: 8 %
Neutro Abs: 4.5 10*3/uL (ref 1.7–7.7)
Neutrophils Relative %: 66 %
Platelets: 243 10*3/uL (ref 150–400)
RBC: 4.73 MIL/uL (ref 3.87–5.11)
RDW: 13.6 % (ref 11.5–15.5)
WBC: 6.9 10*3/uL (ref 4.0–10.5)
nRBC: 0 % (ref 0.0–0.2)

## 2022-07-22 LAB — COMPREHENSIVE METABOLIC PANEL
ALT: 20 U/L (ref 0–44)
AST: 30 U/L (ref 15–41)
Albumin: 3.6 g/dL (ref 3.5–5.0)
Alkaline Phosphatase: 88 U/L (ref 38–126)
Anion gap: 12 (ref 5–15)
BUN: 11 mg/dL (ref 8–23)
CO2: 22 mmol/L (ref 22–32)
Calcium: 9.5 mg/dL (ref 8.9–10.3)
Chloride: 99 mmol/L (ref 98–111)
Creatinine, Ser: 1 mg/dL (ref 0.44–1.00)
GFR, Estimated: 56 mL/min — ABNORMAL LOW (ref >60)
Glucose, Bld: 103 mg/dL — ABNORMAL HIGH (ref 70–99)
Potassium: 4.7 mmol/L (ref 3.5–5.1)
Sodium: 133 mmol/L — ABNORMAL LOW (ref 135–145)
Total Bilirubin: 0.4 mg/dL (ref 0.3–1.2)
Total Protein: 7.6 g/dL (ref 6.5–8.1)

## 2022-07-22 LAB — TROPONIN I (HIGH SENSITIVITY)
Troponin I (High Sensitivity): 16 ng/L (ref <18)
Troponin I (High Sensitivity): 26 ng/L — ABNORMAL HIGH (ref <18)
Troponin I (High Sensitivity): 39 ng/L — ABNORMAL HIGH (ref <18)

## 2022-07-22 NOTE — ED Notes (Signed)
Ambulates to restroom without difficulty  

## 2022-07-22 NOTE — ED Triage Notes (Signed)
Per EMS patient has hx of previous stroke and calls out today due to dizziness and HTN. Patient has been non complainant with meds at home due to various reasons. Patient appears to be at baseline mentation per EMSs conversation with family. BP with EMS 240/120. Patient alert and oriented x4 and ambulatory

## 2022-07-22 NOTE — ED Notes (Signed)
Back from MRI at this time

## 2022-07-22 NOTE — ED Notes (Signed)
Unsuccessful IV stick x2 Passenger transport manager at bedside to attempt

## 2022-07-22 NOTE — ED Notes (Signed)
Ambulates to the restroom at this time 

## 2022-07-22 NOTE — ED Notes (Signed)
Provider calls MRI at this time who report they will get the patient as soon as possible

## 2022-07-22 NOTE — ED Notes (Signed)
Ambulates to restroom without hesitation or assistance

## 2022-07-22 NOTE — ED Provider Notes (Signed)
[facility] EMERGENCY DEPARTMENT Provider Note   CSN: [id number] Arrival date & time: 07/22/22  1531     History CVA,HTN Chief Complaint  Patient presents with   Hypertension    Sharon Gilbert is a 81 y.o. female.  81 year old female with a past medical history of hypertension, CVA presents to the ED via EMS with a chief complaint of hypertension.  Patient currently lives with her son, according to EMS report patient has been noncompliant with her medications at home however she is currently at baseline per family.  Patient's blood pressure with a 240s in the systolics, 100 diastolics.  Patient reports earlier this evening around 2 PM she began to have a generalized head pressure which has now subsided.  He does report having some right-handed weakness deficit from her prior CVA.  Patient is alert and oriented x4.  She does not have any other complaint such as chest pain, shortness of breath. Of note, patient is ambulatory at baseline.  The history is provided by the patient.  Hypertension Associated symptoms include headaches. Pertinent negatives include no chest pain, no abdominal pain and no shortness of breath.       Home Medications Prior to Admission medications   Medication Sig Start Date End Date Taking? Authorizing Provider  aspirin 325 MG tablet Take 325 mg by mouth in the morning.   Yes [provider]  dorzolamide-timolol (COSOPT) 22.3-6.8 MG/ML ophthalmic solution Place 1 drop into both eyes 2 (two) times daily. 07/11/20  Yes Layne Benton, NP  latanoprost (XALATAN) 0.005 % ophthalmic solution Place 1 drop into both eyes at bedtime. 07/11/20  Yes Layne Benton, NP  lisinopril (ZESTRIL) 10 MG tablet Take 0.5 tablets (5 mg total) by mouth daily. Patient taking differently: Take 10 mg by mouth in the morning. 09/17/20  Yes Marcello Fennel, MD  nystatin (MYCOSTATIN/NYSTOP) powder Apply 1 application  topically daily as needed (for rash or  redness under breast). 08/20/21  Yes [provider]  SYSTANE ULTRA 0.4-0.3 % SOLN Place 1 drop into both eyes daily as needed (for dry eyes). 10/14/21  Yes [provider]  atorvastatin (LIPITOR) 80 MG tablet Take 1 tablet (80 mg total) by mouth daily at 6 PM. 07/26/20   Love, Evlyn Kanner, PA-C  clopidogrel (PLAVIX) 75 MG tablet Take 75 mg by mouth in the morning. Patient not taking: Reported on 07/22/2022 08/01/20   [provider]  clotrimazole-betamethasone (LOTRISONE) cream 1 application 08/19/21   [provider]      Allergies    Lipitor [atorvastatin] and Topiramate    Review of Systems   Review of Systems  Constitutional:  Negative for chills and fever.  HENT:  Negative for sore throat.   Respiratory:  Negative for shortness of breath.   Cardiovascular:  Negative for chest pain.  Gastrointestinal:  Negative for abdominal pain, nausea and vomiting.  Genitourinary:  Negative for flank pain.  Neurological:  Positive for dizziness and headaches.  All other systems reviewed and are negative.   Physical Exam Updated Vital Signs BP 138/67   Pulse 66   Temp 98.3 F (36.8 C)   Resp 18   SpO2 100%  Physical Exam Vitals and nursing note reviewed.  Constitutional:      Appearance: Normal appearance. She is not ill-appearing.  HENT:     Head: Normocephalic and atraumatic.     Nose: Nose normal.     Mouth/Throat:     Mouth: Mucous membranes  are moist.  Eyes:     Pupils: Pupils are equal, round, and reactive to light.     Comments: Pupils are equal and reactive.  Cardiovascular:     Rate and Rhythm: Normal rate.  Pulmonary:     Effort: Pulmonary effort is normal.     Breath sounds: No wheezing or rales.     Comments: Lungs are clear to auscultation without any wheezing or rales. Abdominal:     General: Abdomen is flat.     Tenderness: There is no abdominal tenderness.  Musculoskeletal:     Cervical back: Normal range of motion and neck  supple.  Skin:    General: Skin is warm and dry.  Neurological:     Mental Status: She is alert and oriented to person, place, and time.     Comments: No facial asymmetry.Some dysarthria noted on exam. Moves upper and lower extremity.  Decreased sensation to her right face with light touch. Abnormal finger to nose.       ED Results / Procedures / Treatments   Labs (all labs ordered are listed, but only abnormal results are displayed) Labs Reviewed  COMPREHENSIVE METABOLIC PANEL - Abnormal; Notable for the following components:      Result Value   Sodium 133 (*)    Glucose, Bld 103 (*)    GFR, Estimated 56 (*)    All other components within normal limits  URINALYSIS, ROUTINE W REFLEX MICROSCOPIC - Abnormal; Notable for the following components:   Color, Urine STRAW (*)    Specific Gravity, Urine 1.004 (*)    Hgb urine dipstick SMALL (*)    Bacteria, UA RARE (*)    All other components within normal limits  TROPONIN I (HIGH SENSITIVITY) - Abnormal; Notable for the following components:   Troponin I (High Sensitivity) 26 (*)    All other components within normal limits  TROPONIN I (HIGH SENSITIVITY) - Abnormal; Notable for the following components:   Troponin I (High Sensitivity) 39 (*)    All other components within normal limits  CBC WITH DIFFERENTIAL/PLATELET  TROPONIN I (HIGH SENSITIVITY)  TROPONIN I (HIGH SENSITIVITY)    EKG EKG Interpretation  Date/Time:  Tuesday July 22 2022 15:57:06 EDT Ventricular Rate:  82 PR Interval:  219 QRS Duration: 92 QT Interval:  414 QTC Calculation: 484 R Axis:   25 Text Interpretation: Sinus rhythm Atrial premature complexes Borderline prolonged PR interval Anteroseptal infarct, old No significant change since last tracing Confirmed by Gareth Morgan [phone]) on 07/22/2022 6:42:05 PM  Radiology MR BRAIN WO CONTRAST  Result Date: 07/22/2022 CLINICAL DATA:  Initial evaluation for dizziness. EXAM: MRI HEAD WITHOUT CONTRAST  TECHNIQUE: Multiplanar, multiecho pulse sequences of the brain and surrounding structures were obtained without intravenous contrast. COMPARISON:  Prior CT from earlier the same day. FINDINGS: Brain: Cerebral volume within normal limits. Encephalomalacia and gliosis with chronic hemosiderin staining involving the left cerebral hemisphere, consistent with a chronic hemorrhagic left MCA territory infarct. No evidence for acute or subacute ischemia. Gray-white matter differentiation otherwise maintained. No acute intracranial hemorrhage. Single dish in ule punctate chronic microhemorrhage noted at the anterior right frontal lobe. No mass lesion, midline shift or mass effect. No hydrocephalus or extra-axial fluid collection. Pituitary gland and suprasellar region within normal limits. Vascular: Major intracranial vascular flow voids are maintained. Skull and upper cervical spine: Craniocervical junction within normal limits. Bone marrow signal intensity normal. No scalp soft tissue abnormality. Sinuses/Orbits: Globes and orbital soft tissues within normal limits. Mild scattered  mucosal thickening about the ethmoidal air cells and maxillary sinuses. No mastoid effusion. Other: None. IMPRESSION: 1. No acute intracranial abnormality. 2. Chronic hemorrhagic left MCA territory infarct. Electronically Signed   By: Rise Mu M.D.   On: 07/22/2022 20:44   DG Chest Portable 1 View  Result Date: 07/22/2022 CLINICAL DATA:  Dizziness EXAM: PORTABLE CHEST 1 VIEW COMPARISON:  Chest x-ray 07/05/2020 FINDINGS: Cardiomediastinal silhouette is within normal limits. There is some scattered interstitial opacities in the lung bases. Costophrenic angles are clear. No pneumothorax or acute fracture. IMPRESSION: Scattered interstitial opacities in the lung bases, likely chronic. No acute cardiopulmonary process. Electronically Signed   By: Darliss Cheney M.D.   On: 07/22/2022 16:26   CT HEAD WO CONTRAST ( )  Result Date:  07/22/2022 CLINICAL DATA:  Dizziness EXAM: CT HEAD WITHOUT CONTRAST TECHNIQUE: Contiguous axial images were obtained from the base of the skull through the vertex without intravenous contrast. RADIATION DOSE REDUCTION: This exam was performed according to the departmental dose-optimization program which includes automated exposure control, adjustment of the mA and/or kV according to patient size and/or use of iterative reconstruction technique. COMPARISON:  07/05/2020 FINDINGS: Brain: No evidence of acute infarction, hemorrhage, hydrocephalus, extra-axial collection or mass lesion/mass effect. Encephalomalacia in the left MCA territory compatible with previously seen remote infarct. Mild age related cerebral volume loss. Vascular: Atherosclerotic calcifications involving the large vessels of the skull base. No unexpected hyperdense vessel. Skull: Normal. Negative for fracture or focal lesion. Sinuses/Orbits: No acute finding. Other: None. IMPRESSION: 1. No acute intracranial abnormality. 2. Remote left MCA territory infarct. Electronically Signed   By: Duanne Guess D.O.   On: 07/22/2022 16:09    Procedures Procedures    Medications Ordered in ED Medications - No data to display  ED Course/ Medical Decision Making/ A&P                           Medical Decision Making Amount and/or Complexity of Data Reviewed Labs: ordered. Radiology: ordered.   This patient presents to the ED for concern of dizziness, this involves a number of treatment options, and is a complaint that carries with it a high risk of complications and morbidity.  The differential diagnosis includes CVA, ACS, versus infection.    Co morbidities: Discussed in HPI   Brief History:  Patient here with so endorses   EMR reviewed including pt PMHx, past surgical history and past visits to ER.   See HPI for more details  Lab Tests:  I ordered and independently interpreted labs.  The pertinent results include:    I  personally reviewed all laboratory work and imaging. Metabolic panel without any acute abnormality specifically kidney function within normal limits and no significant electrolyte abnormalities. CBC without leukocytosis or significant anemia.  Imaging Studies:  Dg chest portable showed no acute findings.  CT Head showed no acute infarct.  MRI Brain showed: 1. No acute intracranial abnormality.  2. Chronic hemorrhagic left MCA territory infarct.    Cardiac Monitoring:  The patient was maintained on a cardiac monitor.  I personally viewed and interpreted the cardiac monitored which showed an underlying rhythm of: Sinus rhythm  EKG non-ischemic  Medicines ordered:  Consider medication for hypertensive urgency, however patient's blood pressure continued to improve while she was in the emergency department.  Her dizziness subsided. Reevaluation:  After the interventions noted above I re-evaluated patient and found that they have :improved   Social Determinants of Health:  The patient's social determinants of health were a factor in the care of this patient  Problem List / ED Course:  Patient here with dizziness along with HTN, did have headache significant pressure along her head and with prior history of CVA concerning for new ischemia.  On evaluation patient appears is alert and oriented x 4.  She does have some deficits to the right hand however reports these are from her prior CVA.  There are some diminished sensation to the right cheek.  He does move bilateral lower extremities. Rotation of her labs by me with a CMP with no electrolyte derangement, current levels within normal limits per LFTs unremarkable.  CBC with no leukocytosis, hemoglobin is stable.  Troponin was obtained as it was very difficult to obtain history from patient on arrival, no family member was present at that time.  I was able to speak to her son who reports patient has not been compliance with medication. She has  been off her plavix for two weeks due to insurance reasons.  Urinalysis does not show any signs of infection, no leukocytes and 0-5 bacteria shown.  EKG along with troponin trending upwards along with elevated blood pressures I do feel that patient will need to be admitted for further workup. I discussed the case with Dr. Julian Reil who will admit patient for further management.   Dispostion:  After consideration of the diagnostic results and the patients response to treatment, I feel that the patent would benefit from admission for further management and troponin trending.   Portions of this note were generated with Scientist, clinical (histocompatibility and immunogenetics). Dictation errors may occur despite best attempts at proofreading.   Final Clinical Impression(s) / ED Diagnoses Final diagnoses:  Hypertensive urgency    Rx / DC Orders ED Discharge Orders     None         Claude Manges, PA-C 07/23/22 Pearla Dubonnet, MD 07/23/22 1118

## 2022-07-22 NOTE — ED Notes (Signed)
Phlebotomist notified of the need for a lab draw at this time

## 2022-07-22 NOTE — ED Notes (Signed)
Per Laboratory staff patient's troponin is "in process" now

## 2022-07-23 ENCOUNTER — Observation Stay (HOSPITAL_COMMUNITY): Payer: Medicare HMO

## 2022-07-23 ENCOUNTER — Observation Stay (HOSPITAL_BASED_OUTPATIENT_CLINIC_OR_DEPARTMENT_OTHER): Payer: Medicare HMO

## 2022-07-23 ENCOUNTER — Other Ambulatory Visit (HOSPITAL_COMMUNITY): Payer: Self-pay

## 2022-07-23 DIAGNOSIS — R7989 Other specified abnormal findings of blood chemistry: Secondary | ICD-10-CM | POA: Diagnosis present

## 2022-07-23 DIAGNOSIS — I6522 Occlusion and stenosis of left carotid artery: Secondary | ICD-10-CM | POA: Diagnosis not present

## 2022-07-23 DIAGNOSIS — I672 Cerebral atherosclerosis: Secondary | ICD-10-CM | POA: Diagnosis not present

## 2022-07-23 DIAGNOSIS — I361 Nonrheumatic tricuspid (valve) insufficiency: Secondary | ICD-10-CM | POA: Diagnosis not present

## 2022-07-23 DIAGNOSIS — R29898 Other symptoms and signs involving the musculoskeletal system: Secondary | ICD-10-CM | POA: Diagnosis not present

## 2022-07-23 DIAGNOSIS — E785 Hyperlipidemia, unspecified: Secondary | ICD-10-CM

## 2022-07-23 DIAGNOSIS — I10 Essential (primary) hypertension: Secondary | ICD-10-CM | POA: Diagnosis not present

## 2022-07-23 DIAGNOSIS — G459 Transient cerebral ischemic attack, unspecified: Secondary | ICD-10-CM | POA: Diagnosis not present

## 2022-07-23 DIAGNOSIS — E118 Type 2 diabetes mellitus with unspecified complications: Secondary | ICD-10-CM

## 2022-07-23 DIAGNOSIS — I6503 Occlusion and stenosis of bilateral vertebral arteries: Secondary | ICD-10-CM | POA: Diagnosis not present

## 2022-07-23 DIAGNOSIS — I160 Hypertensive urgency: Secondary | ICD-10-CM | POA: Diagnosis not present

## 2022-07-23 DIAGNOSIS — I771 Stricture of artery: Secondary | ICD-10-CM | POA: Diagnosis not present

## 2022-07-23 DIAGNOSIS — I63233 Cerebral infarction due to unspecified occlusion or stenosis of bilateral carotid arteries: Secondary | ICD-10-CM | POA: Diagnosis not present

## 2022-07-23 LAB — LIPID PANEL
Cholesterol: 217 mg/dL — ABNORMAL HIGH (ref 0–200)
HDL: 47 mg/dL (ref >40)
LDL Cholesterol: 157 mg/dL — ABNORMAL HIGH (ref 0–99)
Total CHOL/HDL Ratio: 4.6 RATIO
Triglycerides: 65 mg/dL (ref <150)
VLDL: 13 mg/dL (ref 0–40)

## 2022-07-23 LAB — CBG MONITORING, ED
Glucose-Capillary: 98 mg/dL (ref 70–99)
Glucose-Capillary: 96 mg/dL (ref 70–99)
Glucose-Capillary: 184 mg/dL — ABNORMAL HIGH (ref 70–99)
Glucose-Capillary: 71 mg/dL (ref 70–99)
Glucose-Capillary: 104 mg/dL — ABNORMAL HIGH (ref 70–99)

## 2022-07-23 LAB — HEMOGLOBIN A1C
Hgb A1c MFr Bld: 5.7 % — ABNORMAL HIGH (ref 4.8–5.6)
Mean Plasma Glucose: 116.89 mg/dL

## 2022-07-23 LAB — ECHOCARDIOGRAM COMPLETE
AR max vel: 2.17 cm2
AV Area VTI: 2.19 cm2
AV Area mean vel: 2.08 cm2
AV Mean grad: 3 mmHg
AV Peak grad: 5.7 mmHg
Ao pk vel: 1.19 m/s
Area-P 1/2: 2.27 cm2
Height: 65 in
S' Lateral: 1.7 cm
Weight: 2240 oz

## 2022-07-23 LAB — TROPONIN I (HIGH SENSITIVITY): Troponin I (High Sensitivity): 49 ng/L — ABNORMAL HIGH (ref <18)

## 2022-07-23 MED ORDER — DORZOLAMIDE HCL-TIMOLOL MAL 2-0.5 % OP SOLN
1.0000 [drp] | Freq: Two times a day (BID) | OPHTHALMIC | Status: DC
  Filled 2022-07-23 (×2): qty 10

## 2022-07-23 MED ORDER — CLOPIDOGREL BISULFATE 75 MG PO TABS
75.0000 mg | ORAL_TABLET | Freq: Every morning | ORAL | Status: DC
  Administered 2022-07-23 (×2): 75 mg via ORAL
  Filled 2022-07-23 (×2): qty 1

## 2022-07-23 MED ORDER — DORZOLAMIDE HCL-TIMOLOL MAL 2-0.5 % OP SOLN
1.0000 [drp] | Freq: Two times a day (BID) | OPHTHALMIC | Status: DC
  Administered 2022-07-23: 1 [drp] via OPHTHALMIC
  Filled 2022-07-23: qty 10

## 2022-07-23 MED ORDER — LATANOPROST 0.005 % OP SOLN: 1.0000 [drp] | Freq: Every day | OPHTHALMIC | Status: DC

## 2022-07-23 MED ORDER — LISINOPRIL 10 MG PO TABS: 10.0000 mg | ORAL_TABLET | Freq: Every morning | ORAL | Status: DC

## 2022-07-23 MED ORDER — IOHEXOL 350 MG/ML SOLN
75.0000 mL | Freq: Once | INTRAVENOUS | Status: AC | PRN
  Administered 2022-07-23: 75 mL via INTRAVENOUS

## 2022-07-23 MED ORDER — ASPIRIN 325 MG PO TABS
325.0000 mg | ORAL_TABLET | Freq: Every morning | ORAL | Status: DC
  Administered 2022-07-23: 325 mg via ORAL
  Filled 2022-07-23: qty 1

## 2022-07-23 MED ORDER — CLOPIDOGREL BISULFATE 75 MG PO TABS: 75.0000 mg | ORAL_TABLET | Freq: Every morning | ORAL | 0 refills | Status: AC

## 2022-07-23 MED ORDER — CLOPIDOGREL BISULFATE 75 MG PO TABS
75.0000 mg | ORAL_TABLET | Freq: Every morning | ORAL | 0 refills | Status: DC
  Filled 2022-07-23: qty 30, 30d supply, fill #0

## 2022-07-23 MED ORDER — ENOXAPARIN SODIUM 40 MG/0.4ML IJ SOSY
40.0000 mg | PREFILLED_SYRINGE | INTRAMUSCULAR | Status: DC
  Administered 2022-07-23: 40 mg via SUBCUTANEOUS
  Filled 2022-07-23: qty 0.4

## 2022-07-23 MED ORDER — ACETAMINOPHEN 650 MG RE SUPP: 650.0000 mg | RECTAL | Status: DC | PRN

## 2022-07-23 MED ORDER — POLYVINYL ALCOHOL 1.4 % OP SOLN: 1.0000 [drp] | Freq: Every day | OPHTHALMIC | Status: DC | PRN

## 2022-07-23 MED ORDER — CLOPIDOGREL BISULFATE 75 MG PO TABS: 75.0000 mg | ORAL_TABLET | Freq: Every morning | ORAL | Status: DC

## 2022-07-23 MED ORDER — ACETAMINOPHEN 160 MG/5ML PO SOLN: 650.0000 mg | ORAL | Status: DC | PRN

## 2022-07-23 MED ORDER — LATANOPROST 0.005 % OP SOLN
1.0000 [drp] | Freq: Every day | OPHTHALMIC | Status: DC
  Filled 2022-07-23: qty 2.5

## 2022-07-23 MED ORDER — ROSUVASTATIN CALCIUM 20 MG PO TABS: 20.0000 mg | ORAL_TABLET | Freq: Every day | ORAL | Status: DC

## 2022-07-23 MED ORDER — ACETAMINOPHEN 325 MG PO TABS: 650.0000 mg | ORAL_TABLET | ORAL | Status: DC | PRN

## 2022-07-23 MED ORDER — INSULIN ASPART 100 UNIT/ML IJ SOLN
0.0000 [IU] | INTRAMUSCULAR | Status: DC
  Administered 2022-07-23: 2 [IU] via SUBCUTANEOUS

## 2022-07-23 MED ORDER — STROKE: EARLY STAGES OF RECOVERY BOOK: Freq: Once | Status: DC

## 2022-07-23 NOTE — Assessment & Plan Note (Addendum)
Concerned about possible TIA today. Patients ABCD2 score is 5, and even that is giving 0 points for clinical features (saying her unilateral weakness and speech disturbance is chronic). 1. TIA work up and pathway 2. No acute stroke on MRI 3. Getting CTA head and neck given h/o carotid stenosis s/p stent in 2022. 1. May need to call neuro for consult vs curbside depending on what is seen on this study. 4. Cont ASA 325 5. Resume plavix 6. See dyslipidemia discussion below. 7. Holding home lisinopril for the moment pending CTA results 8. PT/OT/SLP 9. Tele monitor

## 2022-07-23 NOTE — ED Provider Notes (Incomplete)
[place] EMERGENCY DEPARTMENT Provider Note   CSN: [id number] Arrival date & time: 07/22/22  1531     History CVA,HTN Chief Complaint  Patient presents with  . Hypertension    Sharon Gilbert is a 81 y.o. female.  81 year old female with a past medical history of hypertension, CVA presents to the ED via EMS with a chief complaint of hypertension.  Patient currently lives with her son, according to EMS report patient has been noncompliant with her medications at home however she is currently at baseline per family.  Patient's blood pressure with a 144Y in the systolics, 185 diastolics.  Patient reports earlier this evening around 2 PM she began to have a generalized head pressure which has now subsided.  He does report having some right-handed weakness deficit from her prior CVA.  Patient is alert and oriented x4.  She does not have any other complaint such as chest pain, shortness of breath. Of note, patient is ambulatory at baseline.  The history is provided by the patient.  Hypertension Associated symptoms include headaches. Pertinent negatives include no chest pain, no abdominal pain and no shortness of breath.       Home Medications Prior to Admission medications   Medication Sig Start Date End Date Taking? Authorizing Provider  aspirin 325 MG tablet Take 325 mg by mouth in the morning.    [provider]  aspirin 325 MG tablet 1 tablet    [provider]  atorvastatin (LIPITOR) 80 MG tablet Take 1 tablet (80 mg total) by mouth daily at 6 PM. 07/26/20   Love, Ivan Anchors, PA-C  clopidogrel (PLAVIX) 75 MG tablet Take 75 mg by mouth in the morning. 08/01/20   [provider]  clotrimazole-betamethasone (LOTRISONE) cream 1 application 63/14/97   [provider]  dorzolamide-timolol (COSOPT) 22.3-6.8 MG/ML ophthalmic solution Place 1 drop into both eyes 2 (two) times daily. 07/11/20   Donzetta Starch, NP  latanoprost (XALATAN)  0.005 % ophthalmic solution Place 1 drop into both eyes at bedtime. 07/11/20   Donzetta Starch, NP  lisinopril (ZESTRIL) 10 MG tablet Take 0.5 tablets (5 mg total) by mouth daily. Patient taking differently: Take 10 mg by mouth in the morning. 09/17/20   Jamse Arn, MD  nystatin (MYCOSTATIN/NYSTOP) powder Apply 1 application topically 2 (two) times daily. 08/20/21   [provider]  SYSTANE ULTRA 0.4-0.3 % SOLN SMARTSIG:1 Drop(s) In Eye(s) PRN 10/14/21   [provider]      Allergies    Lipitor [atorvastatin] and Topiramate    Review of Systems   Review of Systems  Constitutional:  Negative for chills and fever.  HENT:  Negative for sore throat.   Respiratory:  Negative for shortness of breath.   Cardiovascular:  Negative for chest pain.  Gastrointestinal:  Negative for abdominal pain, nausea and vomiting.  Genitourinary:  Negative for flank pain.  Neurological:  Positive for dizziness and headaches.  All other systems reviewed and are negative.   Physical Exam Updated Vital Signs BP (!) 196/93   Pulse 81   Temp 98.3 F (36.8 C)   SpO2 100%  Physical Exam Vitals and nursing note reviewed.  Constitutional:      Appearance: Normal appearance. She is not ill-appearing.  HENT:     Head: Normocephalic and atraumatic.     Nose: Nose normal.     Mouth/Throat:     Mouth: Mucous membranes are moist.  Eyes:     Pupils:  Pupils are equal, round, and reactive to light.     Comments: Pupils are equal and reactive.  Cardiovascular:     Rate and Rhythm: Normal rate.  Pulmonary:     Effort: Pulmonary effort is normal.     Breath sounds: No wheezing or rales.     Comments: Lungs are clear to auscultation without any wheezing or rales. Abdominal:     General: Abdomen is flat.     Tenderness: There is no abdominal tenderness.  Musculoskeletal:     Cervical back: Normal range of motion and neck supple.  Skin:    General: Skin is warm and dry.  Neurological:      Mental Status: She is alert and oriented to person, place, and time.     Comments: No facial asymmetry.Some dysarthria noted on exam. Moves upper and lower extremity.  Decreased sensation to her right face with light touch. Abnormal finger to nose.       ED Results / Procedures / Treatments   Labs (all labs ordered are listed, but only abnormal results are displayed) Labs Reviewed  CBC WITH DIFFERENTIAL/PLATELET  COMPREHENSIVE METABOLIC PANEL    EKG None  Radiology No results found.  Procedures Procedures    Medications Ordered in ED Medications - No data to display  ED Course/ Medical Decision Making/ A&P                           Medical Decision Making Amount and/or Complexity of Data Reviewed Labs: ordered. Radiology: ordered.   This patient presents to the ED for concern of dizziness, this involves a number of treatment options, and is a complaint that carries with it a high risk of complications and morbidity.  The differential diagnosis includes CVA, ACS, versus infection.    Co morbidities: Discussed in HPI   Brief History:  Patient here with so endorses   EMR reviewed including pt PMHx, past surgical history and past visits to ER.   See HPI for more details   Lab Tests:  I ordered and independently interpreted labs.  The pertinent results include:    I personally reviewed all laboratory work and imaging. Metabolic panel without any acute abnormality specifically kidney function within normal limits and no significant electrolyte abnormalities. CBC without leukocytosis or significant anemia.   Imaging Studies:  Dg chest portable showed no acute findings.  CT Head showed no acute infarct.  MRI Brain showed   Cardiac Monitoring:  .{Blank single:19197::"The patient was maintained on a cardiac monitor.  I personally viewed and interpreted the cardiac monitored which showed an underlying rhythm of:","NA"} .{Blank single:19197::"EKG  non-ischemic","NA"}   Medicines ordered:  I ordered medication including ***  for *** Reevaluation of the patient after these medicines showed that the patient {resolved/improved/worsened:23923::"improved"} I have reviewed the patients home medicines and have made adjustments as needed   Critical Interventions:  .***   Consults:  I requested consultation with ***,  and discussed lab and imaging findings as well as pertinent plan - they recommend: ***    Reevaluation:  After the interventions noted above I re-evaluated patient and found that they have :{resolved/improved/worsened:23923::"improved"}   Social Determinants of Health:  Marland Kitchen{Blank single:19197::"Given cab voucher","Social work/case management involved","The patient's social determinants of health were a factor in the care of this patient"}    Problem List / ED Course:  ***   Dispostion:  After consideration of the diagnostic results and the patients response to treatment, I  feel that the patent would benefit from ***   Portions of this note were generated with [employer]. Dictation errors may occur despite best attempts at proofreading.   Final Clinical Impression(s) / ED Diagnoses Final diagnoses:  None    Rx / DC Orders ED Discharge Orders     None

## 2022-07-23 NOTE — ED Notes (Signed)
Patient provided with a meal and drink at this time

## 2022-07-23 NOTE — Progress Notes (Signed)
PT Cancellation Note  Patient Details Name: Sharon Gilbert MRN: [id number] DOB: 1941-06-05   Cancelled Treatment:    Reason Eval/Treat Not Completed: Medical issues which prohibited therapy.  Attempted to initiate PT and found sitting BP was 148/119, MAP 128.  Follow up at another time.   Ramond Dial 07/23/2022, 11:26 AM  Mee Hives, PT PhD Acute Rehab Dept. Number: [place] and [place]

## 2022-07-23 NOTE — Assessment & Plan Note (Addendum)
Hold lisinopril and allow permissive HTN for the moment, at least until we can vascular imaging (CTA) to see how stenotic vessels are, rule out ISR for her carotid stent, etc. 1. After vascular imaging rules out any acute finding, will put pt on PRN hydralazine / labetalol IV if needed

## 2022-07-23 NOTE — Evaluation (Signed)
Occupational Therapy Evaluation Patient Details Name: Sharon Gilbert MRN: [id number] DOB: 1941-06-25 Today's Date: 07/23/2022   History of Present Illness 81 y.o. female with medical history significant of HTN, HLD, DM2 diet controlled, prior stroke.  Pt with L MCA stroke with residual R hand weakness and speech difficulty.   Clinical Impression   Patient admitted for the diagnosis above.  PTA she lives alone, but her son is over quite a bit, and other family members can provide initial 24 hour assist if needed.  At home she remains active, and needs no assist with ADL, iADL and drives short distances.  Currently she continues with speech difficulties, and mild weakness with incoordination to her right hand.  Otherwise she is up and moving fairly well.  Was able to walk in the halls with a slight drifting to the right noted.  OT will follow in the acute setting, and no post acute OT is anticipated.  Outpatient could be considered, if her dexterity continues to be an issue.        Recommendations for follow up therapy are one component of a multi-disciplinary discharge planning process, led by the attending physician.  Recommendations may be updated based on patient status, additional functional criteria and insurance authorization.   Follow Up Recommendations  No OT follow up    Assistance Recommended at Discharge Intermittent Supervision/Assistance  Patient can return home with the following Assist for transportation    Functional Status Assessment  Patient has had a recent decline in their functional status and demonstrates the ability to make significant improvements in function in a reasonable and predictable amount of time.  Equipment Recommendations  None recommended by OT    Recommendations for Other Services       Precautions / Restrictions Precautions Precautions: Fall Restrictions Weight Bearing Restrictions: No      Mobility Bed Mobility Overal bed mobility:  Modified Independent                  Transfers Overall transfer level: Needs assistance   Transfers: Sit to/from Stand, Bed to chair/wheelchair/BSC Sit to Stand: Supervision     Step pivot transfers: Supervision            Balance Overall balance assessment: Mild deficits observed, not formally tested                                         ADL either performed or assessed with clinical judgement   ADL                                         General ADL Comments: Generalized supervision and increased time due to decreased coordination to Rhand     Vision Baseline Vision/History: 3 Glaucoma Patient Visual Report: No change from baseline       Perception Perception Perception: Within Functional Limits   Praxis Praxis Praxis: Intact    Pertinent Vitals/Pain Pain Assessment Pain Assessment: No/denies pain     Hand Dominance Right   Extremity/Trunk Assessment Upper Extremity Assessment Upper Extremity Assessment: RUE deficits/detail RUE Deficits / Details: mild weakness noted to R arm compared to L, but not significant RUE Sensation: decreased light touch RUE Coordination: decreased fine motor   Lower Extremity Assessment Lower Extremity Assessment: Defer to PT evaluation  Cervical / Trunk Assessment Cervical / Trunk Assessment: Normal   Communication Communication Communication: Expressive difficulties   Cognition Arousal/Alertness: Awake/alert Behavior During Therapy: WFL for tasks assessed/performed Overall Cognitive Status: Within Functional Limits for tasks assessed                                       General Comments  has a tendency to drift to the R  VSS on RA  Exercises     Shoulder Instructions      Home Living Family/patient expects to be discharged to:: Private residence Living Arrangements: Alone Available Help at Discharge: Family;Available 24 hours/day Type of Home:  House Home Access: Stairs to enter [employer] of Steps: 5 Entrance Stairs-Rails: Right;Left Home Layout: One level     Bathroom Shower/Tub: Chief Strategy Officer: Standard Bathroom Accessibility: Yes How Accessible: Accessible via walker Home Equipment: Rolling Walker (2 wheels)          Prior Functioning/Environment Prior Level of Function : Independent/Modified Independent;Driving             Mobility Comments: No longer uses RW and walks in her neighborhood daily ADLs Comments: Completes her own ADL, iADL, meds and financial management.        OT Problem List: Impaired balance (sitting and/or standing);Decreased strength;Impaired UE functional use      OT Treatment/Interventions: Self-care/ADL training;Balance training    OT Goals(Current goals can be found in the care plan section) Acute Rehab OT Goals Patient Stated Goal: Hoping to go home today OT Goal Formulation: With patient Time For Goal Achievement: 08/06/22 Potential to Achieve Goals: Good ADL Goals Pt Will Perform Grooming: Independently;standing Pt Will Perform Lower Body Dressing: Independently;sit to/from stand Pt Will Transfer to Toilet: Independently;ambulating;regular height toilet  OT Frequency: Min 2X/week    Co-evaluation              AM-PAC OT "6 Clicks" Daily Activity     Outcome Measure Help from another person eating meals?: None Help from another person taking care of personal grooming?: None Help from another person toileting, which includes using toliet, bedpan, or urinal?: A Little Help from another person bathing (including washing, rinsing, drying)?: A Little Help from another person to put on and taking off regular upper body clothing?: None Help from another person to put on and taking off regular lower body clothing?: A Little 6 Click Score: 21   End of Session Equipment Utilized During Treatment: Gait belt Nurse Communication: Mobility  status  Activity Tolerance: Patient tolerated treatment well Patient left: in bed;with call bell/phone within reach  OT Visit Diagnosis: Unsteadiness on feet (R26.81);Ataxia, unspecified (R27.0)                Time: 4098-1191 OT Time Calculation (min): 20 min Charges:  OT General Charges $OT Visit: 1 Visit OT Evaluation $OT Eval Moderate Complexity: 1 Mod  07/23/2022  RP, OTR/L  Acute Rehabilitation Services  Office:  [phone]   Suzanna Obey 07/23/2022, 11:28 AM

## 2022-07-23 NOTE — Progress Notes (Signed)
SLP Cancellation Note  Patient Details Name: Sharon Gilbert MRN: [id number] DOB: 03-Jan-1941   Cancelled treatment:        Attempted speech-language-cognitive assessment however pt currently eating lunch. Will continue efforts.    Houston Siren 07/23/2022, 1:39 PM Orbie Pyo Colvin Caroli.Ed Product/process development scientist [phone]

## 2022-07-23 NOTE — Progress Notes (Signed)
  Echocardiogram 2D Echocardiogram has been performed.  Sharon Gilbert 07/23/2022, 3:21 PM

## 2022-07-23 NOTE — Assessment & Plan Note (Signed)
Chronic R hand weakness and dysarthria following prior stroke.

## 2022-07-23 NOTE — Care Management Obs Status (Signed)
[place] NOTIFICATION   Patient Details  Name: Sharon Gilbert MRN: [id number] Date of Birth: 10/16/1940   Medicare Observation Status Notification Given:  Ernesta Amble, RN 07/23/2022, 4:40 PM

## 2022-07-23 NOTE — ED Notes (Addendum)
This RN confirmed with provider verbally that the patient is able to go home now as there was difficult communication amongst paramedic - nurse handoff. Family at bedside notified along with charge nurse

## 2022-07-23 NOTE — ED Notes (Addendum)
Handoff report was given to the RN face to face.

## 2022-07-23 NOTE — ED Notes (Signed)
Provider at bedside

## 2022-07-23 NOTE — Assessment & Plan Note (Signed)
Minimally elevated trops in patient with no CP and no SOB. Likely demand ischemia from HTN episode earlier today.

## 2022-07-23 NOTE — Assessment & Plan Note (Addendum)
Not taking statin at home. Chart says causes muscle aches. 1. Check FLP 2. Offered to restart statin as crestor 20mg  to see if she could tolerate at all, but patient declined. 3. Given very bad looking cholesterol in past.  May want to see if shes candidate for PCSK9, presumably as follow up as outpt.

## 2022-07-23 NOTE — Assessment & Plan Note (Signed)
Continue home ASA 325 and resume home plavix (ran out of this a couple of days ago).

## 2022-07-23 NOTE — ED Notes (Signed)
Patient transported to CT 

## 2022-07-23 NOTE — H&P (Addendum)
History and Physical    Patient: Sharon Gilbert DOB: 1941/05/17 DOA: 07/22/2022 DOS: the patient was seen and examined on 07/23/2022 PCP: Irena Reichmann, DO  Patient coming from: Home  Chief Complaint:  Chief Complaint  Patient presents with   Hypertension   HPI: Sharon Gilbert is a 81 y.o. female with medical history significant of HTN, HLD, DM2 diet controlled, prior stroke.  Pt with L MCA stroke with residual R hand weakness and speech difficulty.  Pt had L carotid stent placed in 2022.  Pt with dizziness and head pressure at about 2pm today.  This has now resolved spontaneously after arriving to ED.  Initial BPs was 240/100.  This has also resolved spontaneously.  No CP, no SOB.  Currently speech and R hand weakness at baseline per family.   Review of Systems: As mentioned in the history of present illness. All other systems reviewed and are negative. Past Medical History:  Diagnosis Date   Cataract    Diabetes mellitus without complication (HCC)    pt son denies pt being diabetic 07/09/21   Hypertension    Stroke [facility])    Past Surgical History:  Procedure Laterality Date   IR ANGIO INTRA EXTRACRAN SEL COM CAROTID INNOMINATE UNI R MOD SED  07/17/2021   IR ANGIO VERTEBRAL SEL SUBCLAVIAN INNOMINATE UNI R MOD SED  07/17/2021   IR CT HEAD LTD  07/04/2020   IR CT HEAD LTD  07/10/2021   IR INTRAVSC STENT CERV CAROTID W/EMB-PROT MOD SED INCL ANGIO  07/10/2021   IR INTRAVSC STENT CERV CAROTID W/O EMB-PROT MOD SED INC ANGIO  07/04/2020   IR PERCUTANEOUS ART THROMBECTOMY/INFUSION INTRACRANIAL INC DIAG ANGIO  07/04/2020   IR RADIOLOGIST EVAL & MGMT  10/01/2020   IR RADIOLOGIST EVAL & MGMT  02/07/2021   RADIOLOGY WITH ANESTHESIA N/A 07/04/2020   Procedure: IR WITH ANESTHESIA;  Surgeon: Radiologist, Medication, MD;  Location: [facility] OR;  Service: Radiology;  Laterality: N/A;   RADIOLOGY WITH ANESTHESIA N/A 07/10/2021   Procedure: STENTING;  Surgeon: Julieanne Cotton, MD;   Location: [facility] OR;  Service: Radiology;  Laterality: N/A;   Social History:  reports that she has never smoked. She has never used smokeless tobacco. She reports that she does not drink alcohol and does not use drugs.  Allergies  Allergen Reactions   Lipitor [Atorvastatin] Other (See Comments)    Muscle pain   Topiramate Other (See Comments)    Vomiting & Wheezing    History reviewed. No pertinent family history.  Prior to Admission medications   Medication Sig Start Date End Date Taking? Authorizing Provider  aspirin 325 MG tablet Take 325 mg by mouth in the morning.   Yes [provider]  dorzolamide-timolol (COSOPT) 22.3-6.8 MG/ML ophthalmic solution Place 1 drop into both eyes 2 (two) times daily. 07/11/20  Yes Layne Benton, NP  latanoprost (XALATAN) 0.005 % ophthalmic solution Place 1 drop into both eyes at bedtime. 07/11/20  Yes Layne Benton, NP  lisinopril (ZESTRIL) 10 MG tablet Take 0.5 tablets (5 mg total) by mouth daily. Patient taking differently: Take 10 mg by mouth in the morning. 09/17/20  Yes Marcello Fennel, MD  nystatin (MYCOSTATIN/NYSTOP) powder Apply 1 application  topically daily as needed (for rash or redness under breast). 08/20/21  Yes [provider]  SYSTANE ULTRA 0.4-0.3 % SOLN Place 1 drop into both eyes daily as needed (for dry eyes). 10/14/21  Yes [provider]  atorvastatin (LIPITOR) 80 MG  tablet Take 1 tablet (80 mg total) by mouth daily at 6 PM. 07/26/20   Love, Evlyn Kanner, PA-C  clopidogrel (PLAVIX) 75 MG tablet Take 75 mg by mouth in the morning. Patient not taking: Reported on 07/22/2022 08/01/20   [provider]  clotrimazole-betamethasone (LOTRISONE) cream 1 application 08/19/21   [provider]    Physical Exam: Vitals:   07/22/22 2300 07/22/22 2333 07/23/22 0000 07/23/22 0030  BP: (!) 117/58 138/67 (!) 143/75 126/61  Pulse: 64 66 66 70  Resp:  18  18  Temp:    98.2 F (36.8 C)  SpO2: 99% 100%  100% 99%   Constitutional: NAD, calm, comfortable Eyes: PERRL, lids and conjunctivae normal ENMT: Mucous membranes are moist. Posterior pharynx clear of any exudate or lesions.Normal dentition.  Neck: normal, supple, no masses, no thyromegaly Respiratory: clear to auscultation bilaterally, no wheezing, no crackles. Normal respiratory effort. No accessory muscle use.  Cardiovascular: Regular rate and rhythm, no murmurs / rubs / gallops. No extremity edema. 2+ pedal pulses. No carotid bruits.  Abdomen: no tenderness, no masses palpated. No hepatosplenomegaly. Bowel sounds positive.  Musculoskeletal: no clubbing / cyanosis. No joint deformity upper and lower extremities. Good ROM, no contractures. Normal muscle tone.  Skin: no rashes, lesions, ulcers. No induration Neurologic: MAE, some dysarthria on exam, decreased sensation to R face, dysmetria. Psychiatric: Normal judgment and insight. Alert and oriented x 3. Normal mood.   Data Reviewed:    MRI brain shows chronic L MCA stroke, no acute findings.     Latest Ref Rng & Units 07/22/2022    4:32 PM 07/11/2021    4:54 AM 07/10/2021    6:41 AM  CBC  WBC 4.0 - 10.5 K/uL 6.9  6.3  6.3   Hemoglobin 12.0 - 15.0 g/dL 48.5  9.1  46.2   Hematocrit 36.0 - 46.0 % 41.9  27.3  38.8   Platelets 150 - 400 K/uL 243  191  286       Latest Ref Rng & Units 07/22/2022    4:32 PM 07/11/2021    4:54 AM 07/10/2021    6:41 AM  CMP  Glucose 70 - 99 mg/dL 703  94  95   BUN 8 - 23 mg/dL 11  13  18    Creatinine 0.44 - 1.00 mg/dL  5.00  9.38   Sodium 135 - 145 mmol/L 133  139  139   Potassium 3.5 - 5.1 mmol/L 4.7  3.9  4.0   Chloride 98 - 111 mmol/L 99  109  102   CO2 22 - 32 mmol/L 22  24  27    Calcium 8.9 - 10.3 mg/dL 9.5  8.3  9.7   Total Protein 6.5 - 8.1 g/dL 7.6     Total Bilirubin 0.3 - 1.2 mg/dL 0.4     Alkaline Phos 38 - 126 U/L 88     AST 15 - 41 U/L 30     ALT 0 - 44 U/L 20      Trops 16, 29, 36, 49  Assessment and Plan: * TIA  (transient ischemic attack) Concerned about possible TIA today. Patients ABCD2 score is 5, and even that is giving 0 points for clinical features (saying her unilateral weakness and speech disturbance is chronic). TIA work up and pathway No acute stroke on MRI Getting CTA head and neck given h/o carotid stenosis s/p stent in 2022. May need to call neuro for consult vs curbside depending on what is  seen on this study. Cont ASA 325 Resume plavix See dyslipidemia discussion below. Holding home lisinopril for the moment pending CTA results PT/OT/SLP Tele monitor  Right hand weakness Chronic R hand weakness and dysarthria following prior stroke.  Dyslipidemia Not taking statin at home. Chart says causes muscle aches. Check FLP Offered to restart statin as crestor 20mg  to see if she could tolerate at all, but patient declined. Given very bad looking cholesterol in past.  May want to see if shes candidate for PCSK9, presumably as follow up as outpt.  Carotid stenosis, left, symptomatic s/p L ICA stent Continue home ASA 325 and resume home plavix (ran out of this a couple of days ago).  Elevated troponin Minimally elevated trops in patient with no CP and no SOB. Likely demand ischemia from HTN episode earlier today.  Controlled type 2 diabetes mellitus with complication, without long-term current use of insulin (HCC) SSI sensitive Q4H  Essential hypertension Hold lisinopril and allow permissive HTN for the moment, at least until we can vascular imaging (CTA) to see how stenotic vessels are, rule out ISR for her carotid stent, etc. After vascular imaging rules out any acute finding, will put pt on PRN hydralazine / labetalol IV if needed      Advance Care Planning:   Code Status: Full Code  Consults: None  Family Communication: Family at bedside  Severity of Illness: The appropriate patient status for this patient is OBSERVATION. Observation status is judged to be reasonable  and necessary in order to provide the required intensity of service to ensure the patient's safety. The patient's presenting symptoms, physical exam findings, and initial radiographic and laboratory data in the context of their medical condition is felt to place them at decreased risk for further clinical deterioration. Furthermore, it is anticipated that the patient will be medically stable for discharge from the hospital within 2 midnights of admission.   Author: Etta Quill., DO 07/23/2022 1:04 AM  For on call review [email or web address].

## 2022-07-23 NOTE — ED Notes (Signed)
Charge nurse [place] made aware of the patients delayed discharge and [place] messages that he is aware

## 2022-07-23 NOTE — Assessment & Plan Note (Signed)
SSI sensitive Q4H

## 2022-07-23 NOTE — Discharge Summary (Signed)
Physician Discharge Summary  Sharon Gilbert NGE:[id number] DOB: 1941-09-09 DOA: 07/22/2022  PCP: Irena Reichmann, DO  Admit date: 07/22/2022 Discharge date: 07/23/2022  Admitted From: Home Disposition:  Home   Recommendations for Outpatient Follow-up:  Follow up with PCP in 1-2 weeks Please obtain BMP/CBC in one week Please follow up interventional radiology as an outpatient  Home Health:NO Equipment/Devices:none   Discharge Condition: Stable CODE STATUS: FULL Diet recommendation: Heart Healthy  Brief/Interim Summary:  Sharon Gilbert is a 81 y.o. female with medical history significant of HTN, HLD, DM2 diet controlled, prior stroke. - Pt with L MCA stroke with residual R hand weakness and speech difficulty.  Pt had L carotid stent placed in 2022. - Pt with dizziness and head pressure at about 2pm today.  This has now resolved spontaneously after arriving to ED.  Initial BPs was 240/100.  This has also resolved spontaneously. Currently speech and R hand weakness at baseline per family.  Dizziness/Hypertensive emergency -Show concern for TIA, but this is most likely uncontrolled blood pressure causing her symptoms, as she did not present with any new focal deficits. -MRI brain with no evidence of acute ischemic event -CTA head and neck negative for large vessel occlusion, bilateral carotid stents in place within the neck with patent flow, and residual moderate stenosis at the mid aspect of the right carotid stent, atheromatosis changes, with chronic left MCA distribution infarct, these findings were reviewed with neurology, no acute findings, and no acute recommendations at this point before compliant with medication. -Continue with aspirin and Plavix, with recommendation to follow-up with interventional neuroradiology Dr. Corliss Skains, as discussed with the patient, her Plavix has been refilled, and did send prescription to her [facility] as well -She is intolerant to statin.. -No  significant events on telemetry (she had few sinus arrhythmias, but none was A-fib) -2D echo with a preserved EF with no embolic source  Right hand weakness Chronic R hand weakness and dysarthria following prior stroke.   Dyslipidemia Not taking statin at home.   Carotid stenosis, left, symptomatic s/p L ICA stent Continue home ASA 325 and resume home plavix (ran out of this a couple of weeks ago).   Elevated troponin Minimally elevated trops in patient with no CP and no SOB. Likely demand ischemia from HTN episode earlier today. No acute findings on echo   Controlled type 2 diabetes mellitus with complication, without long-term current use of insulin (HCC)   Essential hypertension - blood pressure initially elevated, but currently controlled, resume home meds with no changes     Discharge Diagnoses:  Active Problems:   Carotid stenosis, left, symptomatic s/p L ICA stent   Dyslipidemia   Right hand weakness   Essential hypertension   Controlled type 2 diabetes mellitus with complication, without long-term current use of insulin (HCC)   Elevated troponin   Hypertensive urgency    Discharge Instructions  Discharge Instructions     Diet - low sodium heart healthy   Complete by: As directed    Discharge instructions   Complete by: As directed    Follow with Primary MD Irena Reichmann, DO in 7 days   Get CBC, CMP, checked  by Primary MD next visit.    Activity: As tolerated with Full fall precautions use walker/cane & assistance as needed   Disposition Home    Diet: Heart Healthy , with feeding assistance and aspiration precautions.  For Heart failure patients - Check your Weight same time everyday, if you gain over 2  pounds, or you develop in leg swelling, experience more shortness of breath or chest pain, call your Primary MD immediately. Follow Cardiac Low Salt Diet and 1.5 lit/day fluid restriction.   On your next visit with your primary care physician please  Get Medicines reviewed and adjusted.   Please request your Prim.MD to go over all Hospital Tests and Procedure/Radiological results at the follow up, please get all Hospital records sent to your Prim MD by signing hospital release before you go home.   If you experience worsening of your admission symptoms, develop shortness of breath, life threatening emergency, suicidal or homicidal thoughts you must seek medical attention immediately by calling 911 or calling your MD immediately  if symptoms less severe.  You Must read complete instructions/literature along with all the possible adverse reactions/side effects for all the Medicines you take and that have been prescribed to you. Take any new Medicines after you have completely understood and accpet all the possible adverse reactions/side effects.   Do not drive, operating heavy machinery, perform activities at heights, swimming or participation in water activities or provide baby sitting services if your were admitted for syncope or siezures until you have seen by Primary MD or a Neurologist and advised to do so again.  Do not drive when taking Pain medications.    Do not take more than prescribed Pain, Sleep and Anxiety Medications  Special Instructions: If you have smoked or chewed Tobacco  in the last 2 yrs please stop smoking, stop any regular Alcohol  and or any Recreational drug use.  Wear Seat belts while driving.   Please note  You were cared for by a hospitalist during your hospital stay. If you have any questions about your discharge medications or the care you received while you were in the hospital after you are discharged, you can call the unit and asked to speak with the hospitalist on call if the hospitalist that took care of you is not available. Once you are discharged, your primary care physician will handle any further medical issues. Please note that NO REFILLS for any discharge medications will be authorized once you are  discharged, as it is imperative that you return to your primary care physician (or establish a relationship with a primary care physician if you do not have one) for your aftercare needs so that they can reassess your need for medications and monitor your lab values.   Increase activity slowly   Complete by: As directed       Allergies as of 07/23/2022       Reactions   Lipitor [atorvastatin] Other (See Comments)   Muscle pain   Topiramate Other (See Comments)   Vomiting & Wheezing        Medication List     TAKE these medications    aspirin 325 MG tablet Take 325 mg by mouth in the morning.   atorvastatin 80 MG tablet Commonly known as: LIPITOR Take 1 tablet (80 mg total) by mouth daily at 6 PM.   clopidogrel 75 MG tablet Commonly known as: PLAVIX Take 1 tablet (75 mg total) by mouth in the morning.   clotrimazole-betamethasone cream Commonly known as: LOTRISONE 1 application   dorzolamide-timolol 2-0.5 % ophthalmic solution Commonly known as: COSOPT Place 1 drop into both eyes 2 (two) times daily.   latanoprost 0.005 % ophthalmic solution Commonly known as: XALATAN Place 1 drop into both eyes at bedtime.   lisinopril 10 MG tablet Commonly known as: ZESTRIL Take  0.5 tablets (5 mg total) by mouth daily. What changed:  how much to take when to take this   nystatin powder Commonly known as: MYCOSTATIN/NYSTOP Apply 1 application  topically daily as needed (for rash or redness under breast).   Systane Ultra 0.4-0.3 % Soln Generic drug: Polyethyl Glycol-Propyl Glycol Place 1 drop into both eyes daily as needed (for dry eyes).        Follow-up Information     Julieanne Cotton, MD Follow up.   Specialties: Interventional Radiology, Radiology Contact information: [address] [place] [address] [phone]                Allergies  Allergen Reactions   Lipitor [Atorvastatin] Other (See Comments)    Muscle pain    Topiramate Other (See Comments)    Vomiting & Wheezing    Consultations: none   Procedures/Studies: ECHOCARDIOGRAM COMPLETE  Result Date: 07/23/2022    ECHOCARDIOGRAM REPORT   Patient Name:   Sharon Gilbert Date of Exam: 07/23/2022 Medical Rec #:  [id number]       Height:       65.0 in Accession #:    [id number]      Weight:       140.0 lb Date of Birth:  05-05-1941       BSA:          1.700 m Patient Age:    81 years        BP:           92/53 mmHg Patient Gender: F               HR:           63 bpm. Exam Location:  Inpatient Procedure: 2D Echo, Cardiac Doppler and Color Doppler Indications:    TIA  History:        Patient has prior history of Echocardiogram examinations, most                 recent 07/04/2020. Risk Factors:Hypertension, Diabetes and                 Dyslipidemia. Hx CVA.  Sonographer:    Ross Ludwig RDCS (AE) Referring Phys: Hillary Bow IMPRESSIONS  1. Left ventricular ejection fraction, by estimation, is 60 to 65%. The left ventricle has normal function. The left ventricle has no regional wall motion abnormalities. There is mild concentric left ventricular hypertrophy. Left ventricular diastolic parameters are consistent with Grade I diastolic dysfunction (impaired relaxation).  2. Right ventricular systolic function is normal. The right ventricular size is normal. There is normal pulmonary artery systolic pressure.  3. Left atrial size was mildly dilated.  4. The mitral valve is normal in structure. No evidence of mitral valve regurgitation. No evidence of mitral stenosis.  5. The aortic valve is tricuspid. There is mild calcification of the aortic valve. Aortic valve regurgitation is not visualized. Aortic valve sclerosis/calcification is present, without any evidence of aortic stenosis. Aortic valve area, by VTI measures  2.19 cm. Aortic valve mean gradient measures 3.0 mmHg. Aortic valve Vmax measures 1.19 m/s.  6. The inferior vena cava is normal in size with greater than 50%  respiratory variability, suggesting right atrial pressure of 3 mmHg. FINDINGS  Left Ventricle: Left ventricular ejection fraction, by estimation, is 60 to 65%. The left ventricle has normal function. The left ventricle has no regional wall motion abnormalities. The left ventricular internal cavity size was normal in size.  There is  mild concentric left ventricular hypertrophy. Left ventricular diastolic parameters are consistent with Grade I diastolic dysfunction (impaired relaxation). Right Ventricle: The right ventricular size is normal. No increase in right ventricular wall thickness. Right ventricular systolic function is normal. There is normal pulmonary artery systolic pressure. The tricuspid regurgitant velocity is 1.73 m/s, and  with an assumed right atrial pressure of 3 mmHg, the estimated right ventricular systolic pressure is 15.0 mmHg. Left Atrium: Left atrial size was mildly dilated. Right Atrium: Right atrial size was normal in size. Pericardium: There is no evidence of pericardial effusion. Mitral Valve: The mitral valve is normal in structure. No evidence of mitral valve regurgitation. No evidence of mitral valve stenosis. Tricuspid Valve: The tricuspid valve is normal in structure. Tricuspid valve regurgitation is mild . No evidence of tricuspid stenosis. Aortic Valve: The aortic valve is tricuspid. There is mild calcification of the aortic valve. Aortic valve regurgitation is not visualized. Aortic valve sclerosis/calcification is present, without any evidence of aortic stenosis. Aortic valve mean gradient measures 3.0 mmHg. Aortic valve peak gradient measures 5.7 mmHg. Aortic valve area, by VTI measures 2.19 cm. Pulmonic Valve: The pulmonic valve was normal in structure. Pulmonic valve regurgitation is not visualized. No evidence of pulmonic stenosis. Aorta: The aortic root is normal in size and structure. Venous: The inferior vena cava is normal in size with greater than 50% respiratory  variability, suggesting right atrial pressure of 3 mmHg. IAS/Shunts: No atrial level shunt detected by color flow Doppler.  LEFT VENTRICLE PLAX 2D LVIDd:         3.40 cm   Diastology LVIDs:         1.70 cm   LV e' medial:    5.22 cm/s LV PW:         1.40 cm   LV E/e' medial:  10.8 LV IVS:        1.20 cm   LV e' lateral:   7.18 cm/s LVOT diam:     1.70 cm   LV E/e' lateral: 7.9 LV SV:         53 LV SV Index:   31 LVOT Area:     2.27 cm  RIGHT VENTRICLE             IVC RV Basal diam:  2.50 cm     IVC diam: 0.60 cm RV S prime:     10.10 cm/s TAPSE (M-mode): 1.6 cm LEFT ATRIUM             Index        RIGHT ATRIUM           Index LA diam:        3.10 cm 1.82 cm/m   RA Area:     10.50 cm LA Vol (A2C):   46.6 ml 27.41 ml/m  RA Volume:   21.70 ml  12.77 ml/m LA Vol (A4C):   29.7 ml 17.47 ml/m LA Biplane Vol: 37.7 ml 22.18 ml/m  AORTIC VALVE AV Area (Vmax):    2.17 cm AV Area (Vmean):   2.08 cm AV Area (VTI):     2.19 cm AV Vmax:           119.00 cm/s AV Vmean:          87.700 cm/s AV VTI:            0.241 m AV Peak Grad:      5.7 mmHg AV Mean Grad:  3.0 mmHg LVOT Vmax:         114.00 cm/s LVOT Vmean:        80.200 cm/s LVOT VTI:          0.233 m LVOT/AV VTI ratio: 0.97  AORTA Ao Root diam: 2.90 cm Ao Asc diam:  3.10 cm MITRAL VALVE               TRICUSPID VALVE MV Area (PHT): 2.27 cm    TR Peak grad:   12.0 mmHg MV Decel Time: 334 msec    TR Vmax:        173.00 cm/s MV E velocity: 56.60 cm/s MV A velocity: 70.70 cm/s  SHUNTS MV E/A ratio:  0.80        Systemic VTI:  0.23 m                            Systemic Diam: 1.70 cm Arvilla Meresaniel Bensimhon MD Electronically signed by Arvilla Meresaniel Bensimhon MD Signature Date/Time: 07/23/2022/4:06:35 PM    Final    CT ANGIO HEAD NECK W WO CM  Result Date: 07/23/2022 CLINICAL DATA:  Follow-up examination for stroke. EXAM: CT ANGIOGRAPHY HEAD AND NECK TECHNIQUE: Multidetector CT imaging of the head and neck was performed using the standard protocol during bolus administration of  intravenous contrast. Multiplanar CT image reconstructions and MIPs were obtained to evaluate the vascular anatomy. Carotid stenosis measurements (when applicable) are obtained utilizing NASCET criteria, using the distal internal carotid diameter as the denominator. RADIATION DOSE REDUCTION: This exam was performed according to the departmental dose-optimization program which includes automated exposure control, adjustment of the mA and/or kV according to patient size and/or use of iterative reconstruction technique. CONTRAST:  75mL OMNIPAQUE IOHEXOL 350 MG/ML SOLN COMPARISON:  Prior study from 07/22/2022 as well as earlier exams. FINDINGS: CTA NECK FINDINGS Aortic arch: Visualized aortic arch normal caliber center branch pattern. Moderate atheromatous change about the arch and origin the great vessels without high-grade stenosis. Right carotid system: Right CCA tortuous but widely patent. Interval placement of a vascular stent across the right carotid bulb. Patent flow seen through the stent, although residual up to moderate stenosis at the mid aspect of the stent noted (series 5, image 96). Right ICA patent distally without dissection or occlusion. Left carotid system: Left CCA patent proximally. Vascular stent traverses the left carotid bulb with patent flow through the stent, stable. Left ICA patent distally without dissection or occlusion. Vertebral arteries: Both vertebral arteries arise from subclavian arteries. No significant proximal subclavian artery stenosis. Right vertebral artery dominant. Atheromatous change at the origin of the left vertebral artery with at least moderate ostial stenosis. Vertebral arteries otherwise patent without stenosis or dissection. Skeleton: No discrete or worrisome osseous lesions. Other neck: No other acute soft tissue abnormality within the neck. Upper chest: Visualized upper chest demonstrates no acute finding. Review of the MIP images confirms the above findings CTA HEAD  FINDINGS Anterior circulation: Petrous segments patent bilaterally. Atheromatous change within the carotid siphons with associated moderate multifocal narrowing. A1 segments patent. Normal anterior communicating artery complex. Anterior cerebral arteries patent without stenosis. Both M1 segments remain patent. Distal right MCA branches well perfused and patent. Distal left MCA branches are 10 UA did but patent, in keeping with the chronic left MCA distribution infarct. No proximal MCA branch occlusion or high-grade stenosis. Posterior circulation: Both V4 segments widely patent. Both PICA patent. Basilar patent to its distal aspect without stenosis. Superior  cerebellar and posterior cerebral arteries patent bilaterally. Venous sinuses: Grossly patent allowing for timing the contrast bolus. Anatomic variants: None significant.  No visible aneurysm. Review of the MIP images confirms the above findings IMPRESSION: 1. Negative CTA for large vessel occlusion or other emergent finding. 2. Bilateral carotid stents in place within the neck with patent flow. Residual moderate stenosis at the mid aspect of the right carotid stent. 3. Atheromatous change about the carotid siphons with associated moderate multifocal narrowing. 4. Patent but attenuated distal left MCA branches, in keeping with the chronic left MCA distribution infarct. 5. Moderate atheromatous stenosis at the origin of the left vertebral artery. Right vertebral artery dominant and widely patent. Electronically Signed   By: Jeannine Boga M.D.   On: 07/23/2022 01:59   MR BRAIN WO CONTRAST  Result Date: 07/22/2022 CLINICAL DATA:  Initial evaluation for dizziness. EXAM: MRI HEAD WITHOUT CONTRAST TECHNIQUE: Multiplanar, multiecho pulse sequences of the brain and surrounding structures were obtained without intravenous contrast. COMPARISON:  Prior CT from earlier the same day. FINDINGS: Brain: Cerebral volume within normal limits. Encephalomalacia and  gliosis with chronic hemosiderin staining involving the left cerebral hemisphere, consistent with a chronic hemorrhagic left MCA territory infarct. No evidence for acute or subacute ischemia. Gray-white matter differentiation otherwise maintained. No acute intracranial hemorrhage. Single dish in ule punctate chronic microhemorrhage noted at the anterior right frontal lobe. No mass lesion, midline shift or mass effect. No hydrocephalus or extra-axial fluid collection. Pituitary gland and suprasellar region within normal limits. Vascular: Major intracranial vascular flow voids are maintained. Skull and upper cervical spine: Craniocervical junction within normal limits. Bone marrow signal intensity normal. No scalp soft tissue abnormality. Sinuses/Orbits: Globes and orbital soft tissues within normal limits. Mild scattered mucosal thickening about the ethmoidal air cells and maxillary sinuses. No mastoid effusion. Other: None. IMPRESSION: 1. No acute intracranial abnormality. 2. Chronic hemorrhagic left MCA territory infarct. Electronically Signed   By: Jeannine Boga M.D.   On: 07/22/2022 20:44   DG Chest Portable 1 View  Result Date: 07/22/2022 CLINICAL DATA:  Dizziness EXAM: PORTABLE CHEST 1 VIEW COMPARISON:  Chest x-ray 07/05/2020 FINDINGS: Cardiomediastinal silhouette is within normal limits. There is some scattered interstitial opacities in the lung bases. Costophrenic angles are clear. No pneumothorax or acute fracture. IMPRESSION: Scattered interstitial opacities in the lung bases, likely chronic. No acute cardiopulmonary process. Electronically Signed   By: Ronney Asters M.D.   On: 07/22/2022 16:26   CT HEAD WO CONTRAST (5MM)  Result Date: 07/22/2022 CLINICAL DATA:  Dizziness EXAM: CT HEAD WITHOUT CONTRAST TECHNIQUE: Contiguous axial images were obtained from the base of the skull through the vertex without intravenous contrast. RADIATION DOSE REDUCTION: This exam was performed according to the  departmental dose-optimization program which includes automated exposure control, adjustment of the mA and/or kV according to patient size and/or use of iterative reconstruction technique. COMPARISON:  07/05/2020 FINDINGS: Brain: No evidence of acute infarction, hemorrhage, hydrocephalus, extra-axial collection or mass lesion/mass effect. Encephalomalacia in the left MCA territory compatible with previously seen remote infarct. Mild age related cerebral volume loss. Vascular: Atherosclerotic calcifications involving the large vessels of the skull base. No unexpected hyperdense vessel. Skull: Normal. Negative for fracture or focal lesion. Sinuses/Orbits: No acute finding. Other: None. IMPRESSION: 1. No acute intracranial abnormality. 2. Remote left MCA territory infarct. Electronically Signed   By: Davina Poke D.O.   On: 07/22/2022 16:09      Subjective:  No new focal deficits, denies any further dizziness or  headache, asking to go home today Discharge Exam: Vitals:   07/23/22 1500 07/23/22 1511  BP: (!) 108/49   Pulse: 67   Resp: 18   Temp:  97.9 F (36.6 C)  SpO2: 100%    Vitals:   07/23/22 1130 07/23/22 1330 07/23/22 1500 07/23/22 1511  BP: (!) 92/53 114/88 (!) 108/49   Pulse: (!) 59 75 67   Resp: 18 19 18    Temp: 98 F (36.7 C)   97.9 F (36.6 C)  SpO2: 98% 100% 100%   Weight:      Height:        General: Pt is alert, awake, not in acute distress Cardiovascular: RRR, S1/S2 +, no rubs, no gallops Respiratory: CTA bilaterally, no wheezing, no rhonchi Abdominal: Soft, NT, ND, bowel sounds + Extremities: no edema, no cyanosis, right-sided weakness at baseline    The results of significant diagnostics from this hospitalization (including imaging, microbiology, ancillary and laboratory) are listed below for reference.     Microbiology: No results found for this or any previous visit (from the past 240 hour(s)).   Labs: BNP (last 3 results) No results for input(s):  "BNP" in the last 8760 hours. Basic Metabolic Panel: Recent Labs  Lab 07/22/22 1632  NA 133*  K 4.7  CL 99  CO2 22  GLUCOSE 103*  BUN 11  CREATININE 1.00  CALCIUM 9.5   Liver Function Tests: Recent Labs  Lab 07/22/22 1632  AST 30  ALT 20  ALKPHOS 88  BILITOT 0.4  PROT 7.6  ALBUMIN 3.6   No results for input(s): "LIPASE", "AMYLASE" in the last 168 hours. No results for input(s): "AMMONIA" in the last 168 hours. CBC: Recent Labs  Lab 07/22/22 1632  WBC 6.9  NEUTROABS 4.5  HGB 13.9  HCT 41.9  MCV 88.6  PLT 243   Cardiac Enzymes: No results for input(s): "CKTOTAL", "CKMB", "CKMBINDEX", "TROPONINI" in the last 168 hours. BNP: Invalid input(s): "POCBNP" CBG: Recent Labs  Lab 07/23/22 0122 07/23/22 0355 07/23/22 0825 07/23/22 1146 07/23/22 1537  GLUCAP 98 96 184* 71 104*   D-Dimer No results for input(s): "DDIMER" in the last 72 hours. Hgb A1c Recent Labs    07/22/22 1632  HGBA1C 5.7*   Lipid Profile Recent Labs    07/23/22 0301  CHOL 217*  HDL 47  LDLCALC 157*  TRIG 65  CHOLHDL 4.6   Thyroid function studies No results for input(s): "TSH", "T4TOTAL", "T3FREE", "THYROIDAB" in the last 72 hours.  Invalid input(s): "FREET3" Anemia work up No results for input(s): "VITAMINB12", "FOLATE", "FERRITIN", "TIBC", "IRON", "RETICCTPCT" in the last 72 hours. Urinalysis    Component Value Date/Time   COLORURINE STRAW (A) 07/22/2022 1627   APPEARANCEUR CLEAR 07/22/2022 1627   LABSPEC 1.004 (L) 07/22/2022 1627   PHURINE 8.0 07/22/2022 1627   GLUCOSEU NEGATIVE 07/22/2022 1627   HGBUR SMALL (A) 07/22/2022 1627   BILIRUBINUR NEGATIVE 07/22/2022 1627   KETONESUR NEGATIVE 07/22/2022 1627   PROTEINUR NEGATIVE 07/22/2022 1627   NITRITE NEGATIVE 07/22/2022 1627   LEUKOCYTESUR NEGATIVE 07/22/2022 1627   Sepsis Labs Recent Labs  Lab 07/22/22 1632  WBC 6.9   Microbiology No results found for this or any previous visit (from the past 240  hour(s)).   Time coordinating discharge: Over 30 minutes  SIGNED:   07/24/22, MD  [facility] 07/23/2022, 4:36 PM Pager   If 7PM-7AM, please contact night-coverage [email or web address]

## 2022-08-27 DIAGNOSIS — I6521 Occlusion and stenosis of right carotid artery: Secondary | ICD-10-CM | POA: Diagnosis not present

## 2022-08-27 DIAGNOSIS — R7309 Other abnormal glucose: Secondary | ICD-10-CM | POA: Diagnosis not present

## 2022-08-27 DIAGNOSIS — Z0000 Encounter for general adult medical examination without abnormal findings: Secondary | ICD-10-CM | POA: Diagnosis not present

## 2022-08-27 DIAGNOSIS — I10 Essential (primary) hypertension: Secondary | ICD-10-CM | POA: Diagnosis not present

## 2022-08-27 DIAGNOSIS — I69320 Aphasia following cerebral infarction: Secondary | ICD-10-CM | POA: Diagnosis not present

## 2022-08-27 DIAGNOSIS — B372 Candidiasis of skin and nail: Secondary | ICD-10-CM | POA: Diagnosis not present

## 2022-08-27 DIAGNOSIS — K5900 Constipation, unspecified: Secondary | ICD-10-CM | POA: Diagnosis not present

## 2022-08-27 DIAGNOSIS — Z8673 Personal history of transient ischemic attack (TIA), and cerebral infarction without residual deficits: Secondary | ICD-10-CM | POA: Diagnosis not present

## 2022-09-02 ENCOUNTER — Telehealth: Payer: Self-pay | Admitting: *Deleted

## 2022-09-02 ENCOUNTER — Other Ambulatory Visit: Payer: Self-pay

## 2022-09-02 DIAGNOSIS — Z8673 Personal history of transient ischemic attack (TIA), and cerebral infarction without residual deficits: Secondary | ICD-10-CM

## 2022-09-02 NOTE — Progress Notes (Unsigned)
  Care Coordination  Outreach Note  09/02/2022 Name: Sharon Gilbert MRN: [id number] DOB: Apr 26, 1941   Care Coordination Outreach Attempts: An unsuccessful telephone outreach was attempted today to offer the patient information about available care coordination services as a benefit of their health plan.   Follow Up Plan:  Additional outreach attempts will be made to offer the patient care coordination information and services.   Encounter Outcome:  Pt. Request to Call Back  [facility] Coordination Care Guide  Direct Dial: [phone]

## 2022-09-04 NOTE — Progress Notes (Signed)
  Care Coordination   Note   09/04/2022 Name: TEMARA LANUM MRN: [id number] DOB: Feb 26, 1941  CARMALETA YOUNGERS is a 81 y.o. year old female who sees Irena Reichmann, [place] for primary care. I reached out to Micheline Rough by phone today to offer care coordination services.  Ms. Sunde was given information about Care Coordination services today including:   The Care Coordination services include support from the care team which includes your Nurse Coordinator, Clinical Social Worker, or Pharmacist.  The Care Coordination team is here to help remove barriers to the health concerns and goals most important to you. Care Coordination services are voluntary, and the patient may decline or stop services at any time by request to their care team member.   Care Coordination Consent Status: Patient agreed to services and verbal consent obtained.   Follow up plan:  Telephone appointment with care coordination team member scheduled for:  09/11/22  Encounter Outcome:  Pt. Scheduled  [facility] Coordination Care Guide  Direct Dial: [phone]

## 2022-09-11 DIAGNOSIS — H40022 Open angle with borderline findings, high risk, left eye: Secondary | ICD-10-CM | POA: Diagnosis not present

## 2022-09-11 DIAGNOSIS — E119 Type 2 diabetes mellitus without complications: Secondary | ICD-10-CM | POA: Diagnosis not present

## 2022-09-11 DIAGNOSIS — H401111 Primary open-angle glaucoma, right eye, mild stage: Secondary | ICD-10-CM | POA: Diagnosis not present

## 2022-09-11 DIAGNOSIS — H524 Presbyopia: Secondary | ICD-10-CM | POA: Diagnosis not present

## 2022-09-11 DIAGNOSIS — H25813 Combined forms of age-related cataract, bilateral: Secondary | ICD-10-CM | POA: Diagnosis not present

## 2022-09-16 ENCOUNTER — Ambulatory Visit (INDEPENDENT_AMBULATORY_CARE_PROVIDER_SITE_OTHER): Payer: Medicare HMO | Admitting: Podiatry

## 2022-09-16 VITALS — BP 181/87

## 2022-09-16 DIAGNOSIS — I739 Peripheral vascular disease, unspecified: Secondary | ICD-10-CM

## 2022-09-16 DIAGNOSIS — M79674 Pain in right toe(s): Secondary | ICD-10-CM

## 2022-09-16 DIAGNOSIS — M79675 Pain in left toe(s): Secondary | ICD-10-CM | POA: Diagnosis not present

## 2022-09-16 DIAGNOSIS — M2041 Other hammer toe(s) (acquired), right foot: Secondary | ICD-10-CM | POA: Diagnosis not present

## 2022-09-16 DIAGNOSIS — Q828 Other specified congenital malformations of skin: Secondary | ICD-10-CM | POA: Diagnosis not present

## 2022-09-16 DIAGNOSIS — M2042 Other hammer toe(s) (acquired), left foot: Secondary | ICD-10-CM | POA: Diagnosis not present

## 2022-09-16 DIAGNOSIS — E118 Type 2 diabetes mellitus with unspecified complications: Secondary | ICD-10-CM

## 2022-09-16 DIAGNOSIS — B351 Tinea unguium: Secondary | ICD-10-CM | POA: Diagnosis not present

## 2022-09-16 DIAGNOSIS — E119 Type 2 diabetes mellitus without complications: Secondary | ICD-10-CM | POA: Diagnosis not present

## 2022-09-16 NOTE — Progress Notes (Signed)
  Subjective:  Patient ID: Sharon Gilbert, female    DOB: 06/22/1941,  MRN: [id number]  Sharon Gilbert presents to clinic today for for annual diabetic foot examination and painful elongated mycotic toenails 1-5 bilaterally which are tender when wearing enclosed shoe gear. Pain is relieved with periodic professional debridement.  Chief Complaint  Patient presents with   Nail Problem    R[facility] PCP-Dana Collins PCP VST- 2 weeks ago   New problem(s): None.   PCP is Irena Reichmann, DO.  Risk factors:  Allergies  Allergen Reactions   Lipitor [Atorvastatin] Other (See Comments)    Muscle pain   Topiramate Other (See Comments)    Vomiting & Wheezing    Review of Systems: Negative except as noted in the HPI.  Objective: No changes noted in today's physical examination. Vitals:   09/16/22 1515 09/16/22 1541  BP: (!) 200/84 (!) 181/87   Sharon Gilbert is a pleasant 81 y.o. female WD, WN in NAD. AAO x 3.  Vascular Examination: CFT <5 seconds b/l. DP pulses diminished b/l. PT pulses faintly palpable b/l. Skin temperature gradient warm to cool b/l. No pain with calf compression. No ischemia or gangrene. No cyanosis or clubbing noted b/l. Pedal hair present. No edema noted b/l LE. No cyanosis or clubbing noted b/l LE.   Neurological Examination: Protective sensation decreased with 10 gram monofilament b/l. Vibratory sensation intact b/l. Proprioception intact bilaterally.  Dermatological Examination: Pedal skin warm and supple b/l. Toenails 1-5 b/l thick, discolored, elongated with subungual debris and pain on dorsal palpation.  Porokeratotic lesion(s) plantar aspect of right foot x 2 and plantar aspect of left foot x 2. No erythema, no edema, no drainage, no fluctuance.  Musculoskeletal Examination: Muscle strength 5/5 to b/l LE. HAV with bunion bilaterally and hammertoes 2-5 b/l.  Radiographs: None  Last A1c:      Latest Ref Rng & Units 07/22/2022    4:32 PM  Hemoglobin A1C   Hemoglobin-A1c 4.8 - 5.6 % 5.7    Assessment/Plan: 1. Pain due to onychomycosis of toenails of both feet   2. Porokeratosis   3. PAD (peripheral artery disease) (HCC)   4. Acquired hammertoes of both feet   5. Controlled type 2 diabetes mellitus with complication, without long-term current use of insulin (HCC)   6. Encounter for diabetic foot exam (HCC)     No orders of the defined types were placed in this encounter.   -Patient was evaluated and treated. All patient's and/or POA's questions/concerns answered on today's visit. -Patient with h/o PAD followed by Vascular. -Diabetic foot examination performed today. -Continue diabetic foot care principles: inspect feet daily, monitor glucose as recommended by PCP and/or Endocrinologist, and follow prescribed diet per PCP, Endocrinologist and/or dietician. -Patient to continue soft, supportive shoe gear daily. -Mycotic toenails 1-5 bilaterally were debrided in length and girth with sterile nail nippers and dremel without incident. -Porokeratotic lesion(s) plantar aspect of right foot x 2 and plantar aspect of left foot x 2 pared and enucleated with sterile currette without incident. Total number of lesions debrided=4. -Patient/POA to call should there be question/concern in the interim.   Return in about 3 months (around 12/16/2022).  Freddie Breech, DPM

## 2022-09-21 ENCOUNTER — Encounter: Payer: Self-pay | Admitting: Podiatry

## 2022-09-25 ENCOUNTER — Telehealth: Payer: Self-pay

## 2022-09-25 NOTE — Patient Instructions (Signed)
Visit Information  Thank you for taking time to visit with me today. Please don't hesitate to contact me if I can be of assistance to you.   Following are the goals we discussed today:   Goals Addressed             This Visit's Progress    Hypertension Management       Care Coordination Interventions: Advised patient to medication management.  Patiet unable to review medications on call.  Discussed possibly of pill packaging. Patient agreeable to referral to [facility] for help with medications Evaluation of current treatment plan related to hypertension self management and patient's adherence to plan as established by provider Discussed plans with patient for ongoing care management follow up and provided patient with direct contact information for care management team  09/25/22 Referral to [facility] complete          Our next appointment is by telephone on 10/10/22 at 1000  Please call the care guide team at [phone] if you need to cancel or reschedule your appointment.   If you are experiencing a Mental Health or Behavioral Health Crisis or need someone to talk to, please call the Suicide and Crisis Lifeline: 988   Patient verbalizes understanding of instructions and care plan provided today and agrees to view in MyChart. Active MyChart status and patient understanding of how to access instructions and care plan via MyChart confirmed with patient.     Telephone follow up appointment with care management team member scheduled for: January  Nykayla Marcelli J Luccas Towell, RN, MSN [facility] Care Management Care Management Coordinator Direct Line [phone]

## 2022-09-25 NOTE — Patient Outreach (Signed)
  Care Coordination   Initial Visit Note   09/25/2022 Name: Sharon Gilbert MRN: [id number] DOB: 1941/03/14  Sharon Gilbert is a 81 y.o. year old female who sees Irena Reichmann, [place] for primary care. I spoke with  Micheline Rough by phone today.  What matters to the patients health and wellness today?  Nothing per patient    Goals Addressed             This Visit's Progress    Hypertension Management       Care Coordination Interventions: Advised patient to medication management.  Patiet unable to review medications on call.  Discussed possibly of pill packaging. Patient agreeable to referral to [facility] for help with medications Evaluation of current treatment plan related to hypertension self management and patient's adherence to plan as established by provider Discussed plans with patient for ongoing care management follow up and provided patient with direct contact information for care management team  09/25/22 Referral to [facility] complete          SDOH assessments and interventions completed:  Yes  SDOH Interventions Today    Flowsheet Row Most Recent Value  SDOH Interventions   Transportation Interventions Intervention Not Indicated        Care Coordination Interventions:  Yes, provided   Follow up plan: Follow up call scheduled for January    Encounter Outcome:  Pt. Visit Completed   Bary Leriche, RN, MSN [facility] Care Management Care Management Coordinator Direct Line [phone]

## 2022-10-01 ENCOUNTER — Telehealth: Payer: Self-pay

## 2022-10-01 NOTE — Patient Outreach (Signed)
  Care Coordination   In person with provider  Visit Note   10/01/2022 Name: Sharon Gilbert MRN: [id number] DOB: 11-09-1940  Sharon Gilbert is a 81 y.o. year old female who sees Irena Reichmann, [place] for primary care. I  spoke with Elise Benne concerning patient referral to [facility]  What matters to the patients health and wellness today?  N/A    Goals Addressed             This Visit's Progress    Hypertension Management       Care Coordination Interventions: Advised patient to medication management.  Patiet unable to review medications on call.  Discussed possibly of pill packaging. Patient agreeable to referral to [facility] for help with medications Evaluation of current treatment plan related to hypertension self management and patient's adherence to plan as established by provider Discussed plans with patient for ongoing care management follow up and provided patient with direct contact information for care management team  09/25/22 Referral to [facility] complete Collaboration with [name]-Dr. Thomasena Edis assistant.  Advised that patient is enrolled with [facility] for CCM and information sent to [facility].          SDOH assessments and interventions completed:  No     Care Coordination Interventions:  Yes, provided   Follow up plan: Follow up call scheduled for January    Encounter Outcome:  Pt. Visit Completed   Bary Leriche, RN, MSN [facility] Care Management Care Management Coordinator Direct Line [phone]

## 2022-10-10 ENCOUNTER — Ambulatory Visit: Payer: Self-pay

## 2022-10-10 NOTE — Patient Outreach (Signed)
  Care Coordination   Follow Up Visit Note   10/10/2022 Name: Sharon Gilbert MRN: [id number] DOB: [birth year]  Sharon Gilbert is a 82 y.o. year old female who sees Janie Morning, [place] for primary care. I spoke with  [name] by phone today. Patient part of [facility] CCM program What matters to the patients health and wellness today?  none    Goals Addressed             This Visit's Progress    COMPLETED: Hypertension Management       Care Coordination Interventions: Advised patient to medication management.  Patiet unable to review medications on call.  Discussed possibly of pill packaging. Patient agreeable to referral to [facility] for help with medications Evaluation of current treatment plan related to hypertension self management and patient's adherence to plan as established by provider Discussed plans with patient for ongoing care management follow up and provided patient with direct contact information for care management team  09/25/22 Referral to [facility] complete Collaboration with [name]-Dr. Theda Sers assistant.  Advised that patient is enrolled with [facility] for CCM and information sent to [facility]. 10/10/22 Patient  with [facility].  Advised patient that [facility] would be calling.She verbalized understanding.            SDOH assessments and interventions completed:  Yes     Care Coordination Interventions:  Yes, provided   Follow up plan: No further intervention required.   Encounter Outcome:  Pt. Visit Completed   Jone Baseman, RN, MSN [facility] Care Management Coordinator Direct Line [phone]

## 2022-11-06 DIAGNOSIS — M1990 Unspecified osteoarthritis, unspecified site: Secondary | ICD-10-CM | POA: Diagnosis not present

## 2022-11-06 DIAGNOSIS — I69351 Hemiplegia and hemiparesis following cerebral infarction affecting right dominant side: Secondary | ICD-10-CM | POA: Diagnosis not present

## 2022-11-06 DIAGNOSIS — H9193 Unspecified hearing loss, bilateral: Secondary | ICD-10-CM | POA: Diagnosis not present

## 2022-11-06 DIAGNOSIS — I69322 Dysarthria following cerebral infarction: Secondary | ICD-10-CM | POA: Diagnosis not present

## 2022-11-06 DIAGNOSIS — Z008 Encounter for other general examination: Secondary | ICD-10-CM | POA: Diagnosis not present

## 2022-11-06 DIAGNOSIS — I10 Essential (primary) hypertension: Secondary | ICD-10-CM | POA: Diagnosis not present

## 2022-11-06 DIAGNOSIS — I739 Peripheral vascular disease, unspecified: Secondary | ICD-10-CM | POA: Diagnosis not present

## 2022-11-06 DIAGNOSIS — K5900 Constipation, unspecified: Secondary | ICD-10-CM | POA: Diagnosis not present

## 2022-11-06 DIAGNOSIS — H409 Unspecified glaucoma: Secondary | ICD-10-CM | POA: Diagnosis not present

## 2022-12-31 ENCOUNTER — Ambulatory Visit: Payer: Medicare HMO | Admitting: Podiatrist

## 2022-12-31 ENCOUNTER — Ambulatory Visit: Payer: Medicare HMO | Admitting: Podiatry

## 2023-01-06 ENCOUNTER — Ambulatory Visit: Payer: Medicare HMO | Admitting: Podiatry

## 2023-02-06 ENCOUNTER — Ambulatory Visit (INDEPENDENT_AMBULATORY_CARE_PROVIDER_SITE_OTHER): Payer: Medicare HMO | Admitting: Podiatry

## 2023-02-06 ENCOUNTER — Encounter: Payer: Self-pay | Admitting: Podiatry

## 2023-02-06 DIAGNOSIS — E118 Type 2 diabetes mellitus with unspecified complications: Secondary | ICD-10-CM | POA: Diagnosis not present

## 2023-02-06 DIAGNOSIS — Q828 Other specified congenital malformations of skin: Secondary | ICD-10-CM

## 2023-02-06 DIAGNOSIS — B351 Tinea unguium: Secondary | ICD-10-CM

## 2023-02-06 DIAGNOSIS — M79675 Pain in left toe(s): Secondary | ICD-10-CM | POA: Diagnosis not present

## 2023-02-06 DIAGNOSIS — I739 Peripheral vascular disease, unspecified: Secondary | ICD-10-CM | POA: Diagnosis not present

## 2023-02-06 DIAGNOSIS — M79674 Pain in right toe(s): Secondary | ICD-10-CM | POA: Diagnosis not present

## 2023-02-06 NOTE — Progress Notes (Signed)
Subjective: Chief Complaint  Patient presents with   Debridement    Trim toenails/calluses   82 year old female presents the office today with above concerns.  No open lesions that she reports.  No other concerns.  No injuries.  Last A1c 5.7 on 07/22/2022  Irena Reichmann, DO  Objective: AAO x3, NAD DP, PT pulses 1/4. Sensation decreased with SWMF. Nails are hypertrophic, dystrophic, brittle, discolored, elongated 10. No surrounding redness or drainage. Tenderness nails 1-5 bilaterally. No open lesions or pre-ulcerative lesions are identified today. Hyperkeratotic lesions noted plantar aspect right foot and left foot x 2 without any any underlying ulceration drainage or signs of infection No pain with calf compression, swelling, warmth, erythema  Assessment: 82 year old female with symptomatic onychosis, hyperkeratotic lesions  Plan: -All treatment options discussed with the patient including all alternatives, risks, complications.  -Sharply debrided nails x 10 without any complications or bleeding -Sharply debrided the hyperkeratotic lesions x 4 without any complications or bleeding -Daily foot inspection. -Patient encouraged to call the office with any questions, concerns, change in symptoms.   Vivi Barrack DPM

## 2023-02-10 DIAGNOSIS — I10 Essential (primary) hypertension: Secondary | ICD-10-CM | POA: Diagnosis not present

## 2023-02-10 DIAGNOSIS — M791 Myalgia, unspecified site: Secondary | ICD-10-CM | POA: Diagnosis not present

## 2023-02-10 DIAGNOSIS — I69320 Aphasia following cerebral infarction: Secondary | ICD-10-CM | POA: Diagnosis not present

## 2023-02-10 DIAGNOSIS — E7800 Pure hypercholesterolemia, unspecified: Secondary | ICD-10-CM | POA: Diagnosis not present

## 2023-02-10 DIAGNOSIS — Z8673 Personal history of transient ischemic attack (TIA), and cerebral infarction without residual deficits: Secondary | ICD-10-CM | POA: Diagnosis not present

## 2023-02-10 DIAGNOSIS — L309 Dermatitis, unspecified: Secondary | ICD-10-CM | POA: Diagnosis not present

## 2023-02-10 DIAGNOSIS — R7989 Other specified abnormal findings of blood chemistry: Secondary | ICD-10-CM | POA: Diagnosis not present

## 2023-02-10 DIAGNOSIS — R7309 Other abnormal glucose: Secondary | ICD-10-CM | POA: Diagnosis not present

## 2023-05-15 ENCOUNTER — Ambulatory Visit: Payer: Medicare HMO | Admitting: Podiatry

## 2023-07-30 ENCOUNTER — Ambulatory Visit: Payer: Medicare HMO | Admitting: Podiatry

## 2023-07-30 ENCOUNTER — Encounter: Payer: Self-pay | Admitting: Podiatry

## 2023-07-30 DIAGNOSIS — Q828 Other specified congenital malformations of skin: Secondary | ICD-10-CM

## 2023-07-30 DIAGNOSIS — B351 Tinea unguium: Secondary | ICD-10-CM | POA: Diagnosis not present

## 2023-07-30 DIAGNOSIS — M79674 Pain in right toe(s): Secondary | ICD-10-CM

## 2023-07-30 DIAGNOSIS — M79675 Pain in left toe(s): Secondary | ICD-10-CM

## 2023-07-30 DIAGNOSIS — Z7901 Long term (current) use of anticoagulants: Secondary | ICD-10-CM | POA: Diagnosis not present

## 2023-07-30 NOTE — Progress Notes (Signed)
Subjective: Chief Complaint  Patient presents with   Routine Post Op    PATIENT STATES THAT SHE IS IN NO PAIN , SHE HAS BEEN DOING WELL.     82 year old female presents the office today with above concerns.  No open lesions that she reports.  No other concerns.  No injuries.  She is on Plavix.   Irena Reichmann, DO Last seen 02/10/2023  Objective: AAO x3, NAD DP, PT pulses 1/4. Sensation decreased with SWMF. Nails are hypertrophic, dystrophic, brittle, discolored, elongated 10. No surrounding redness or drainage. Tenderness nails 1-5 bilaterally. No open lesions or pre-ulcerative lesions are identified today. Punctate, annular hyperkeratotic lesions noted plantar aspect right foot and left foot x 2 without any any underlying ulceration drainage or signs of infection No pain with calf compression, swelling, warmth, erythema  Assessment: 82 year old female with symptomatic onychosis, hyperkeratotic lesions  Plan: -All treatment options discussed with the patient including all alternatives, risks, complications.  -Sharply debrided nails x 10 without any complications or bleeding -Sharply debrided the hyperkeratotic lesions x 4 without any complications or bleeding -Daily foot inspection. -Patient encouraged to call the office with any questions, concerns, change in symptoms.   Vivi Barrack DPM

## 2023-12-03 DIAGNOSIS — I639 Cerebral infarction, unspecified: Secondary | ICD-10-CM | POA: Diagnosis not present

## 2023-12-03 DIAGNOSIS — I10 Essential (primary) hypertension: Secondary | ICD-10-CM | POA: Diagnosis not present

## 2023-12-03 DIAGNOSIS — E559 Vitamin D deficiency, unspecified: Secondary | ICD-10-CM | POA: Diagnosis not present

## 2023-12-03 DIAGNOSIS — R7303 Prediabetes: Secondary | ICD-10-CM | POA: Diagnosis not present

## 2023-12-03 DIAGNOSIS — H900 Conductive hearing loss, bilateral: Secondary | ICD-10-CM | POA: Diagnosis not present

## 2023-12-03 DIAGNOSIS — E782 Mixed hyperlipidemia: Secondary | ICD-10-CM | POA: Diagnosis not present

## 2023-12-03 DIAGNOSIS — R634 Abnormal weight loss: Secondary | ICD-10-CM | POA: Diagnosis not present

## 2023-12-28 DIAGNOSIS — E1169 Type 2 diabetes mellitus with other specified complication: Secondary | ICD-10-CM | POA: Diagnosis not present

## 2023-12-28 DIAGNOSIS — I10 Essential (primary) hypertension: Secondary | ICD-10-CM | POA: Diagnosis not present

## 2023-12-28 DIAGNOSIS — E7800 Pure hypercholesterolemia, unspecified: Secondary | ICD-10-CM | POA: Diagnosis not present

## 2023-12-28 DIAGNOSIS — R739 Hyperglycemia, unspecified: Secondary | ICD-10-CM | POA: Diagnosis not present

## 2023-12-28 DIAGNOSIS — R946 Abnormal results of thyroid function studies: Secondary | ICD-10-CM | POA: Diagnosis not present

## 2023-12-28 DIAGNOSIS — Z8673 Personal history of transient ischemic attack (TIA), and cerebral infarction without residual deficits: Secondary | ICD-10-CM | POA: Diagnosis not present

## 2023-12-28 DIAGNOSIS — Z79899 Other long term (current) drug therapy: Secondary | ICD-10-CM | POA: Diagnosis not present

## 2024-01-04 DIAGNOSIS — I10 Essential (primary) hypertension: Secondary | ICD-10-CM | POA: Diagnosis not present

## 2024-01-04 DIAGNOSIS — I69320 Aphasia following cerebral infarction: Secondary | ICD-10-CM | POA: Diagnosis not present

## 2024-01-04 DIAGNOSIS — L309 Dermatitis, unspecified: Secondary | ICD-10-CM | POA: Diagnosis not present

## 2024-01-04 DIAGNOSIS — Z8673 Personal history of transient ischemic attack (TIA), and cerebral infarction without residual deficits: Secondary | ICD-10-CM | POA: Diagnosis not present

## 2024-01-04 DIAGNOSIS — H9193 Unspecified hearing loss, bilateral: Secondary | ICD-10-CM | POA: Diagnosis not present

## 2024-01-04 DIAGNOSIS — Z0000 Encounter for general adult medical examination without abnormal findings: Secondary | ICD-10-CM | POA: Diagnosis not present

## 2024-02-10 DIAGNOSIS — H65192 Other acute nonsuppurative otitis media, left ear: Secondary | ICD-10-CM | POA: Diagnosis not present

## 2024-02-10 DIAGNOSIS — Z6828 Body mass index (BMI) 28.0-28.9, adult: Secondary | ICD-10-CM | POA: Diagnosis not present

## 2024-02-10 DIAGNOSIS — I10 Essential (primary) hypertension: Secondary | ICD-10-CM | POA: Diagnosis not present

## 2024-02-10 DIAGNOSIS — E663 Overweight: Secondary | ICD-10-CM | POA: Diagnosis not present

## 2024-02-15 DIAGNOSIS — I10 Essential (primary) hypertension: Secondary | ICD-10-CM | POA: Diagnosis not present

## 2024-02-15 DIAGNOSIS — H6122 Impacted cerumen, left ear: Secondary | ICD-10-CM | POA: Diagnosis not present

## 2024-02-22 DIAGNOSIS — H9222 Otorrhagia, left ear: Secondary | ICD-10-CM | POA: Diagnosis not present

## 2024-02-22 DIAGNOSIS — I10 Essential (primary) hypertension: Secondary | ICD-10-CM | POA: Diagnosis not present

## 2024-03-14 ENCOUNTER — Encounter: Payer: Self-pay | Admitting: Podiatry

## 2024-03-14 ENCOUNTER — Ambulatory Visit (INDEPENDENT_AMBULATORY_CARE_PROVIDER_SITE_OTHER): Admitting: Podiatry

## 2024-03-14 DIAGNOSIS — M79674 Pain in right toe(s): Secondary | ICD-10-CM | POA: Diagnosis not present

## 2024-03-14 DIAGNOSIS — B353 Tinea pedis: Secondary | ICD-10-CM | POA: Diagnosis not present

## 2024-03-14 DIAGNOSIS — M79675 Pain in left toe(s): Secondary | ICD-10-CM

## 2024-03-14 DIAGNOSIS — Z7901 Long term (current) use of anticoagulants: Secondary | ICD-10-CM | POA: Diagnosis not present

## 2024-03-14 DIAGNOSIS — B351 Tinea unguium: Secondary | ICD-10-CM | POA: Diagnosis not present

## 2024-03-14 MED ORDER — CICLOPIROX 0.77 % EX GEL: 1.0000 | Freq: Two times a day (BID) | CUTANEOUS | 1 refills | Status: AC

## 2024-03-14 NOTE — Progress Notes (Signed)
 Subjective: Chief Complaint  Patient presents with   RFC    RM#[id number] RFC     83 year old female presents the office today with above concerns. She states at times her feet will itch.  No open lesions.  She is on Plavix .   Pete Brand, DO Last seen 01/04/2024  Objective: AAO x3, NAD DP, PT pulses 1/4. Sensation decreased with SWMF. Nails are hypertrophic, dystrophic, brittle, discolored, elongated 10. No surrounding redness or drainage. Tenderness nails 1-5 bilaterally. No open lesions or pre-ulcerative lesions are identified today. There is dry skin with macerated tissue noted on the periphery on the first interspace bilaterally.  There is no edema, erythema drainage or pus or signs of infection. No pain with calf compression, swelling, warmth, erythema  Assessment: 83 year old female with symptomatic onychosis, tinea pedis/skin lesion  Plan: Symptomatic onychomycosis -Sharply debrided nails x 10 without any complications or bleeding  Skin lesion/tinea pedis -I did debride that first interspaces bilaterally and complications or bleeding.  Prescribed ciclopirox gel.  Discussed drying thoroughly between toes. -Monitor for any clinical signs or symptoms of infection and directed to call the office immediately should any occur or go to the ER.  Return in about 3 months (around 06/14/2024) for nail trim.  Charity Conch DPM

## 2024-03-24 DIAGNOSIS — T162XXA Foreign body in left ear, initial encounter: Secondary | ICD-10-CM | POA: Diagnosis not present

## 2024-03-24 DIAGNOSIS — I10 Essential (primary) hypertension: Secondary | ICD-10-CM | POA: Diagnosis not present

## 2024-03-24 DIAGNOSIS — H9103 Ototoxic hearing loss, bilateral: Secondary | ICD-10-CM | POA: Diagnosis not present

## 2024-03-28 DIAGNOSIS — I10 Essential (primary) hypertension: Secondary | ICD-10-CM | POA: Diagnosis not present

## 2024-03-28 DIAGNOSIS — B3784 Candidal otitis externa: Secondary | ICD-10-CM | POA: Diagnosis not present

## 2024-06-14 ENCOUNTER — Ambulatory Visit: Admitting: Podiatry
